# Patient Record
Sex: Male | Born: 1955 | Race: White | Hispanic: No | Smoking: Former smoker
Health system: Southern US, Community
[De-identification: ages and names within clinical notes are randomized; demographics above are authoritative.]

## PROBLEM LIST (undated history)

## (undated) DIAGNOSIS — I10 Essential (primary) hypertension: Secondary | ICD-10-CM

## (undated) DIAGNOSIS — E119 Type 2 diabetes mellitus without complications: Secondary | ICD-10-CM

## (undated) HISTORY — PX: CHOLECYSTECTOMY: SHX55

## (undated) HISTORY — PX: HERNIA REPAIR: SHX51

---

## 2002-06-24 ENCOUNTER — Inpatient Hospital Stay (HOSPITAL_COMMUNITY): Admission: AD | Admit: 2002-06-24 | Discharge: 2002-06-26 | Payer: Self-pay | Admitting: Family Medicine

## 2002-06-24 ENCOUNTER — Encounter: Payer: Self-pay | Admitting: Family Medicine

## 2007-03-05 ENCOUNTER — Ambulatory Visit (HOSPITAL_COMMUNITY): Admission: RE | Admit: 2007-03-05 | Discharge: 2007-03-05 | Payer: Self-pay | Admitting: Family Medicine

## 2010-12-31 NOTE — Consult Note (Signed)
NAME:  Scott Rios, Scott Rios                          ACCOUNT NO.:  [id number]   MEDICAL RECORD NO.:  [id number]                   PATIENT TYPE:  INP   LOCATION:  A311                                 FACILITY:  [facility]   PHYSICIAN:  R. Roetta Sessions, M.D.              DATE OF BIRTH:  1956/07/02   DATE OF CONSULTATION:  06/25/2002  DATE OF DISCHARGE:                                   CONSULTATION   REASON FOR CONSULTATION:  Acute pancreatitis.   HISTORY OF PRESENT ILLNESS:  The patient is a pleasant 55 year old gentleman  with history of diabetes mellitus, gout and hypertension, who presented with  a four-day history of severe epigastric pain, left upper quadrant pain  radiating into his back.  This was associated with nausea and vomiting.  He  was seen by Dr. Lorin Picket A. Luking in his office on Monday and was admitted for  further evaluation and management.  He had a CT of the abdomen and pelvis  which revealed a focal pancreatitis at the head of the pancreas.  On  admission, labs revealed normal LFTs except for albumin of 3.4, WBC of 19.4,  glucose 262, sodium 123, calcium 8.9, lipase 105, amylase 77.  The patient  states he had a similar episode two or three weeks ago; this lasted only for  a couple of days.  In between episodes, he is entirely pain-free.  He only  has occasional heartburn.  He usually has six formed stools daily.  Denies  any melena or rectal bleeding or hematemesis.  When he develops these  episodes of abdominal pain, he also will have some constipation.  With this  last episode, he had episodes of feeling very hot and then chills; no  documented fever, however.   He complains of epigastric/left upper quadrant abdominal pain which began  Saturday.  This progressively worsened over the weekend.  He rates it a  10/10 with a stabbing-type quality.  This radiated into his back.  It was  associated with multiple episodes of vomiting.  He tried to medicate himself  with  ibuprofen or Motrin with no relief.  Prior to development of these  symptoms, he had not been on any medication; he discontinued his Zestril,  Glucophage and allopurinol over one year ago, stating that he was unable to  afford his medication after being laid off from work.  Denies any other over-  the-counter medications.  He does not drink any alcohol.  He has no known  history of hypertriglyceridemia.  No family history of pancreatitis.  He is  status post cholecystectomy approximately two years ago.   Since admission, he has been n.p.o.  He is feeling much better today and  having no pain.  He is tolerating a clear liquid diet.   MEDICATIONS AT HOME:  He took Motrin and ibuprofen since his pain began.   ALLERGIES:  PENICILLIN.   PAST  MEDICAL HISTORY:  Diabetes mellitus, hypertension, gout.   PAST SURGICAL HISTORY:  Left inguinal hernia repair at age 55, pilonidal  cyst removal five years ago, cholecystectomy two years ago, tonsillectomy,  vasectomy in 1989.   FAMILY HISTORY:  No family history of pancreatitis.  His father died of oat  cell lung cancer.  No family history of colon cancer or chronic GI  illnesses.   SOCIAL HISTORY:  He is married and has three sons.  He smokes one pack of  cigarettes daily.  Denies any alcohol use.  He is laid off from [employer]; he is  currently unemployed.   REVIEW OF SYSTEMS:  Please see HPI for GI.  GENERAL:  Denies any weight  loss.  CARDIOPULMONARY:  Denies any chest pain or shortness of breath.   PHYSICAL EXAMINATION:  VITAL SIGNS:  Weight 222.7, height 5 feet 7 inches.  T-max 99.1, T current 97.7; pulse 105; respirations 20; and blood pressure  154/103.  GENERAL:  A pleasant, moderately obese Caucasian male in no acute distress.  SKIN:  Skin warm and dry, no jaundice.  HEENT:  Pupils are equal, round and reactive to light.  Conjunctivae are  pink.  Sclerae nonicteric.  Oropharyngeal mucosa moist and pink.  No  lesions, erythema or  exudate.  NECK:  No lymphadenopathy, thyromegaly or carotid bruits.  CHEST:  Lungs clear to auscultation.  CARDIAC:  Exam reveals a regular rate and rhythm, normal S1 and S2, no  murmurs, rubs, or gallops.  ABDOMEN:  Positive bowel sounds, diffuse but symmetrical.  Soft and  nontender.  No organomegaly or masses.  EXTREMITIES:  No edema.   LABORATORY DATA:  Laboratories today:  White count 16.5, hemoglobin 13.7,  hematocrit 38.5, platelets 268,000.  Sodium 131, potassium 4.8, BUN 6,  creatinine 0.9, glucose 230, total bilirubin 1, alkaline phosphatase 6, SGOT  15, SGPT 17, albumin 3.1, amylase 83, lipase 92, calcium 8.7.   IMPRESSION:  The patient is a pleasant 55 year old Caucasian male with acute  pancreatitis, based on symptomatology, CT findings and elevated lipase.  Currently, his symptoms are resolving.  He no longer has any abdominal pain.  He describes a similar episode two to three weeks ago which may have been  also an acute pancreatitis attack.  At this point, etiology has yet to be  determined.  There was no evidence of masses on CT, no apparent stones or  intrahepatic or extrahepatic biliary dilatation; serum calcium levels  normal; he is not on any medications which could provoke this; denies any  alcohol use; he is status post cholecystectomy.  A lipid panel is pending.  We also cannot rule out microlithiasis.   SUGGESTIONS:  1. Continue supportive measures.  2. Clear liquids are okay for now.  3. Follow up pending labs.  Based on lipid panel, we could consider a watch-     and-wait approach with a minimal of     a pancreatic CT with thin slices in three months versus further workup     now including endoscopic ultrasound or MRCP in the near future.   I would like to thank Dr. Lorin Picket A. Luking for allowing [place] to take part in  the care of this patient.     Tana Coast, Pricilla Larsson, M.D.   LL/MEDQ  D:  06/25/2002  T:  06/25/2002  Job:  [id number]   cc:   Lorin Picket A. Gerda Diss, M.D.  [address], [address]  [place]  [place] [address]  Fax: [phone]   R. Roetta Sessions, M.D.  [address]  [place]  [place] [address]  Fax: [phone]

## 2010-12-31 NOTE — H&P (Signed)
NAME:  Scott Rios, Scott Rios                          ACCOUNT NO.:  [id number]   MEDICAL RECORD NO.:  [id number]                   PATIENT TYPE:  INP   LOCATION:  A311                                 FACILITY:  [facility]   PHYSICIAN:  Scott A. Gerda Diss, M.D.               DATE OF BIRTH:  11-14-1955   DATE OF ADMISSION:  06/24/2002  DATE OF DISCHARGE:                                HISTORY & PHYSICAL   CHIEF COMPLAINT:  Abdominal pain.   HISTORY OF PRESENT ILLNESS:  This patient is a 55 year old white male who  has had at least three to four days of nausea and vomiting and abdominal  pain.  He describes the abdominal pain as being upper epigastric into the  right upper quadrant and sometimes radiates towards the back and sometimes  radiates up into the chest.  He relates nausea with multiple episodes of  vomiting.  Somewhat decreased urination.  Denies dysuria.  Denies fevers or  chills.  States the pain has been very intense.  He has tried many different  foods and drinks without success.  He denies any excessive use of acid-  inducing foods or medications.  Denies B.C. Powder, aspirin, Goody Powder,  Advil use, etc.  He does smoke a couple of packs a day; does not drink on a  regular basis.   PAST MEDICAL HISTORY:  Gout, hypertension, diabetes.  Currently he is not  taking any medicines.  He states he got laid off, could not afford the  medicines, so he stopped the medicines.  He has had a negative Cardiolite in  March 1999.   FAMILY HISTORY:  He has a family history of heart disease and lung cancer.   ALLERGIES:  PENCILLIN.   MEDICATIONS:  His usual medicines include:  1. Zestril 10 mg a day.  2. HCTZ 25 mg one-half q.a.m.  3. Aspirin one q.d.  4. Glucophage 500 mg two in the morning and one in the evening.   He is not taking any of these and has not been for well over a month.   REVIEW OF SYSTEMS:  Negative for headaches, blurred vision.  Relates  somewhat thirsty.  Denies neck  stiffness.  Denies shortness of breath.  Denies chest pressure with activity.  Denies joint discomforts.  States  somewhat frequent urination.   LABORATORY DATA:  Wbc 19.4, hemoglobin 15.1, hematocrit 41.6.  Sodium 123,  potassium 3.9, bicarb 22, glucose 262, amylase 77, lipase 105.   CT scan of the abdomen shows inflammation in the pancreas.  No tumors or  masses.    ASSESSMENT AND PLAN:  Abdominal pain - feel this is due to pancreatitis.  Feel the patient has a mild case but with the white count being elevated and  the persistent vomiting and low sodium, he needs to be hospitalized on IV  fluids, bowel rest.  Do not see any signs of infection  currently.  Will  monitor closely over the next 24-48 hours.  Will consult GI in the morning.  Will use pain control also and follow up labs again in the morning.                                                Scott A. Gerda Diss, M.D.    SAL/MEDQ  D:  06/25/2002  T:  06/25/2002  Job:  [id number]

## 2010-12-31 NOTE — Discharge Summary (Signed)
   NAME:  Scott Rios, Scott Rios                          ACCOUNT NO.:  [id number]   MEDICAL RECORD NO.:  [id number]                   PATIENT TYPE:  INP   LOCATION:  A311                                 FACILITY:  [facility]   PHYSICIAN:  Scott A. Gerda Diss, M.D.               DATE OF BIRTH:  1955/08/25   DATE OF ADMISSION:  06/24/2002  DATE OF DISCHARGE:  06/26/2002                                 DISCHARGE SUMMARY   DISCHARGE DIAGNOSES:  1. Pancreatitis.  2. Hypertriglyceridemia.  3. Diabetes.  4. Hypertension.   HOSPITAL COURSE:  The patient was admitted in with abdominal  discomfort/pain.  Tests pointing toward pancreatitis.  He did have an  ultrasound CT done.  Did not show any phlegmon or cysts.  Did show slight  inflammation consistent with pancreatitis.  Did have mild elevation of  enzymes which corrected and also, too, he had a lipid profile done which  showed a cholesterol 544 and triglycerides 2999.  The patient was talked to  at length regarding these findings in regards what to eat, what not to eat,  exercise, etc.   DISCHARGE MEDICATIONS:  1. Zestril 10 mg daily.  2. Glucophage 500 two in the morning, one in the evening.  3. Crestor 10 mg one daily.  Samples given.   FOLLOW UP:  Follow up in the office in six weeks.  May need additional  medicines added upon recheck and lipid liver profile in six to eight weeks.                                               Scott A. Gerda Diss, M.D.    SAL/MEDQ  D:  08/27/2002  T:  08/27/2002  Job:  [id number]

## 2012-09-29 ENCOUNTER — Emergency Department (HOSPITAL_COMMUNITY): Payer: 59

## 2012-09-29 ENCOUNTER — Encounter (HOSPITAL_COMMUNITY): Payer: Self-pay | Admitting: Emergency Medicine

## 2012-09-29 ENCOUNTER — Emergency Department (HOSPITAL_COMMUNITY)
Admission: EM | Admit: 2012-09-29 | Discharge: 2012-09-30 | Payer: 59 | Attending: Emergency Medicine | Admitting: Emergency Medicine

## 2012-09-29 DIAGNOSIS — E119 Type 2 diabetes mellitus without complications: Secondary | ICD-10-CM

## 2012-09-29 DIAGNOSIS — S63642A Sprain of metacarpophalangeal joint of left thumb, initial encounter: Secondary | ICD-10-CM

## 2012-09-29 DIAGNOSIS — R4182 Altered mental status, unspecified: Secondary | ICD-10-CM | POA: Insufficient documentation

## 2012-09-29 DIAGNOSIS — S63659A Sprain of metacarpophalangeal joint of unspecified finger, initial encounter: Secondary | ICD-10-CM | POA: Insufficient documentation

## 2012-09-29 DIAGNOSIS — Y939 Activity, unspecified: Secondary | ICD-10-CM | POA: Insufficient documentation

## 2012-09-29 DIAGNOSIS — Y929 Unspecified place or not applicable: Secondary | ICD-10-CM | POA: Insufficient documentation

## 2012-09-29 DIAGNOSIS — F29 Unspecified psychosis not due to a substance or known physiological condition: Secondary | ICD-10-CM | POA: Insufficient documentation

## 2012-09-29 DIAGNOSIS — Y33XXXA Other specified events, undetermined intent, initial encounter: Secondary | ICD-10-CM | POA: Insufficient documentation

## 2012-09-29 DIAGNOSIS — F23 Brief psychotic disorder: Secondary | ICD-10-CM

## 2012-09-29 HISTORY — DX: Essential (primary) hypertension: I10

## 2012-09-29 HISTORY — DX: Type 2 diabetes mellitus without complications: E11.9

## 2012-09-29 LAB — BASIC METABOLIC PANEL
CO2: 23 mEq/L (ref 19–32)
Calcium: 9.7 mg/dL (ref 8.4–10.5)
GFR calc Af Amer: >90 mL/min (ref >90)
GFR calc non Af Amer: >90 mL/min (ref >90)
Sodium: 138 mEq/L (ref 135–145)

## 2012-09-29 LAB — RAPID URINE DRUG SCREEN, HOSP PERFORMED
Amphetamines: NOT DETECTED
Barbiturates: NOT DETECTED
Benzodiazepines: NOT DETECTED

## 2012-09-29 LAB — CBC WITH DIFFERENTIAL/PLATELET
Basophils Absolute: 0.1 10*3/uL (ref 0.0–0.1)
Eosinophils Relative: 0 % (ref 0–5)
Lymphocytes Relative: 25 % (ref 12–46)
MCV: 82.4 fL (ref 78.0–100.0)
Platelets: 328 10*3/uL (ref 150–400)
RDW: 14.6 % (ref 11.5–15.5)
WBC: 13.8 10*3/uL — ABNORMAL HIGH (ref 4.0–10.5)

## 2012-09-29 LAB — URINALYSIS, ROUTINE W REFLEX MICROSCOPIC
Ketones, ur: 15 mg/dL — AB
Leukocytes, UA: NEGATIVE
Nitrite: NEGATIVE
Protein, ur: NEGATIVE mg/dL
Urobilinogen, UA: 0.2 mg/dL (ref 0.0–1.0)
pH: 5.5 (ref 5.0–8.0)

## 2012-09-29 LAB — ETHANOL: Alcohol, Ethyl (B): <11 mg/dL (ref 0–11)

## 2012-09-29 MED ORDER — ALUM & MAG HYDROXIDE-SIMETH 200-200-20 MG/5ML PO SUSP: 30.0000 mL | ORAL | Status: DC | PRN

## 2012-09-29 MED ORDER — ACETAMINOPHEN 325 MG PO TABS: 650.0000 mg | ORAL_TABLET | ORAL | Status: DC | PRN

## 2012-09-29 MED ORDER — ZOLPIDEM TARTRATE 5 MG PO TABS: 5.0000 mg | ORAL_TABLET | Freq: Every evening | ORAL | Status: DC | PRN

## 2012-09-29 MED ORDER — INSULIN ASPART 100 UNIT/ML ~~LOC~~ SOLN
0.0000 [IU] | Freq: Three times a day (TID) | SUBCUTANEOUS | Status: DC
Start: 2012-09-30 — End: 2012-09-30
  Administered 2012-09-30: 2 [IU] via SUBCUTANEOUS
  Filled 2012-09-29: qty 1

## 2012-09-29 MED ORDER — HALOPERIDOL 5 MG PO TABS
5.0000 mg | ORAL_TABLET | Freq: Once | ORAL | Status: AC
  Administered 2012-09-29: 5 mg via ORAL
  Filled 2012-09-29: qty 1

## 2012-09-29 MED ORDER — HALOPERIDOL 5 MG PO TABS: 5.0000 mg | ORAL_TABLET | Freq: Two times a day (BID) | ORAL | Status: DC

## 2012-09-29 MED ORDER — METFORMIN HCL 500 MG PO TABS
500.0000 mg | ORAL_TABLET | Freq: Two times a day (BID) | ORAL | Status: DC
  Administered 2012-09-30: 500 mg via ORAL
  Filled 2012-09-29: qty 1

## 2012-09-29 MED ORDER — LORAZEPAM 1 MG PO TABS
1.0000 mg | ORAL_TABLET | Freq: Three times a day (TID) | ORAL | Status: DC | PRN
  Administered 2012-09-29: 1 mg via ORAL
  Filled 2012-09-29: qty 1

## 2012-09-29 MED ORDER — ONDANSETRON HCL 4 MG PO TABS: 4.0000 mg | ORAL_TABLET | Freq: Three times a day (TID) | ORAL | Status: DC | PRN

## 2012-09-29 MED ORDER — IBUPROFEN 400 MG PO TABS: 600.0000 mg | ORAL_TABLET | Freq: Three times a day (TID) | ORAL | Status: DC | PRN

## 2012-09-29 NOTE — ED Notes (Signed)
Affect and though processes remain the same. Does remember our previous conversation 7pm. After much thought remembered he was in [place] in [place].

## 2012-09-29 NOTE — BH Assessment (Signed)
[facility] Assessment Progress Note      09/29/2012 Attempting placement for patient at this time.  There are no beds at [facility], [facility], [facility] and [facility]. Patient is on a wait list for [facility] and [facility]. Patient has been referred to [facility], [facility], [facility], [facility], [facility] and [facility].   Awaiting call back from any of the facilities to accept patient.  IVC paperwork completed and signed by the ED physician.

## 2012-09-29 NOTE — BH Assessment (Signed)
[facility] Assessment Progress Note      09/29/2012 Raynelle Fanning from [facility] states they are full and do not have availability for inpatient services.  Shon Baton, MSW, LCSw, [place], CSW-G
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
## 2012-09-29 NOTE — ED Notes (Signed)
Pt's son [name], at bedside, pt did recognize him after a few minutes. Now talking in repetitive circles. Remains calm
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
## 2012-09-30 NOTE — ED Provider Notes (Signed)
[id number] Advised by nursing that patient has been accepted at [facility] by Ronn Melena, NP. Pt stable in ED with no significant deterioration in condition.The patient appears reasonably screened and/or stabilized for discharge and I doubt any other medical condition or other [facility] requiring further screening, evaluation, or treatment in the ED at this time prior to discharge.  Nicoletta Dress. Colon Branch, MD 09/30/12 [id number]

## 2012-09-30 NOTE — ED Notes (Signed)
Belongings given to [employer]. Wallet in bag. No belongings with security.

## 2012-09-30 NOTE — ED Notes (Signed)
Received call from [facility], will accept pt tonight. Sheriffs deputy notified to arrange transport.

## 2012-09-30 NOTE — ED Notes (Signed)
Talked with pt's son [name], and let him know of impending transfer to [facility].

## 2012-09-30 NOTE — ED Notes (Signed)
Received call from [facility] needing additional fax info. States they will review with their doctor in am and call us back. Info requested has been faxed.

## 2012-09-30 NOTE — ED Notes (Addendum)
Transport [facility] [employer] [facility], will not be avail until after 6am. Plan at this time is to keep pt here until after breakfast and morning meds and transport between 7:30-8am. Our ED physician and [facility] both OK w/this plan

## 2013-10-11 ENCOUNTER — Encounter (HOSPITAL_COMMUNITY): Payer: Self-pay | Admitting: Emergency Medicine

## 2013-10-11 ENCOUNTER — Emergency Department (HOSPITAL_COMMUNITY)
Admission: EM | Admit: 2013-10-11 | Discharge: 2013-10-11 | Disposition: A | Discharge status: Home / Self Care | Payer: 59 | Attending: Emergency Medicine | Admitting: Emergency Medicine

## 2013-10-11 DIAGNOSIS — F17200 Nicotine dependence, unspecified, uncomplicated: Secondary | ICD-10-CM | POA: Insufficient documentation

## 2013-10-11 DIAGNOSIS — E119 Type 2 diabetes mellitus without complications: Secondary | ICD-10-CM | POA: Insufficient documentation

## 2013-10-11 DIAGNOSIS — I10 Essential (primary) hypertension: Secondary | ICD-10-CM | POA: Insufficient documentation

## 2013-10-11 DIAGNOSIS — R079 Chest pain, unspecified: Secondary | ICD-10-CM | POA: Insufficient documentation

## 2013-10-11 DIAGNOSIS — Z880 Allergy status to penicillin: Secondary | ICD-10-CM | POA: Insufficient documentation

## 2013-10-11 DIAGNOSIS — R0602 Shortness of breath: Secondary | ICD-10-CM | POA: Insufficient documentation

## 2013-10-11 DIAGNOSIS — F4321 Adjustment disorder with depressed mood: Secondary | ICD-10-CM | POA: Insufficient documentation

## 2013-10-11 LAB — I-STAT CHEM 8, ED
BUN: 7 mg/dL (ref 6–23)
CREATININE: 0.7 mg/dL (ref 0.50–1.35)
Calcium, Ion: 1.13 mmol/L (ref 1.12–1.23)
Chloride: 101 mEq/L (ref 96–112)
Glucose, Bld: 321 mg/dL — ABNORMAL HIGH (ref 70–99)
HEMATOCRIT: 45 % (ref 39.0–52.0)
HEMOGLOBIN: 15.3 g/dL (ref 13.0–17.0)
Potassium: 3.7 mEq/L (ref 3.7–5.3)
SODIUM: 141 meq/L (ref 137–147)
TCO2: 23 mmol/L (ref 0–100)

## 2013-10-11 LAB — I-STAT TROPONIN, ED: TROPONIN I, POC: 0.02 ng/mL (ref 0.00–0.08)

## 2013-10-11 NOTE — Discharge Instructions (Signed)
Return to the ED if you get chest pain again. [facility] need to [facility] to be [facility] soon, your blood sugar was 321 today. Also return to the ED if you feel you can't deal with your depression or you feel you might do something to hurt yourself or others.    Grief Reaction Grief is a normal response to the death of someone close to you. Feelings of fear, anger, and guilt can affect almost everyone who loses someone they love. Symptoms of depression are also common. These include problems with sleep, loss of appetite, and lack of energy. These grief reaction symptoms often last for weeks to months after a loss. They may also return during special times that remind you of the person you lost, such as an anniversary or birthday. Anxiety, insomnia, irritability, and deep depression may last beyond the period of normal grief. If you experience these feelings for 6 months or longer, you may have clinical depression. Clinical depression requires further medical attention. If you think that you have clinical depression, you should contact your caregiver. If you have a history of depression and or a family history of depression, you are at greater risk of clinical depression. You are also at greater risk of developing clinical depression if the loss was traumatic or the loss was of someone with whom you had unresolved issues.  A grief reaction can become complicated by being blocked. This means being unable to cry or express extreme emotions. This may prolong the grieving period and worsen the emotional effects of the loss. Mourning is a natural event in human life. A healthy grief reaction is one that is not blocked . It requires a time of sadness and readjustment.It is very important to share your sorrow and fear with others, especially close friends and family. Professional counselors and clergy can also help you process your grief. Document Released: 08/01/2005 Document Revised: 10/24/2011 Document Reviewed:  04/11/2006 [facility] Patient Information 2014 [employer], [place].

## 2013-10-11 NOTE — ED Notes (Signed)
Scott Rios called to inform staff that she had spoken with both son and pt. Pt is in denial over his wife's death this morning. States patient also worked 3rd shift last night and has not eaten or had any sleep.

## 2013-10-11 NOTE — ED Provider Notes (Signed)
CSN: [id number]     Arrival date & time 10/11/13  1210 History   First MD Initiated Contact with Patient 10/11/13 1238     Chief Complaint  Patient presents with  . Anxiety     (Consider location/radiation/quality/duration/timing/severity/associated sxs/prior Treatment) HPI  Patient reports he came home this morning and his wife usually sleeps in the living room because she is an invalid and has difficulty getting up and down and he became aware that she was not responding to him entering the house. He went to ask his son if he had noted that she got up during the night which he said he last seen her at 2:00 and her breathing was more shallow than usual. Patient then went over and checked his wife and noted that she was not breathing and she was cold to touch. They called 911 and after the police arrived and verified to him that his wife was deceased patient had an episode of chest pain and shortness of breath that lasted about 5 minutes per his son. He was encouraged to come to the ED to be evaluated. He and his son report this happened about 9 AM. Patient states he has no known history of heart disease. He does have diabetes and hypertension but he takes no medications. Patient is unable to describe his chest discomfort.  PCP Dr. Sherwood GamblerFusco  Past Medical History  Diagnosis Date  . Hypertension   . Diabetes mellitus without complication    Past Surgical History  Procedure Laterality Date  . Cholecystectomy    . Hernia repair     No family history on file. History  Substance Use Topics  . Smoking status: Current Every Day Smoker  . Smokeless tobacco: Not on file  . Alcohol Use: No  lives with son Smokes 1 ppd employed  Review of Systems  All other systems reviewed and are negative.      Allergies  Penicillins  Home Medications  No current outpatient prescriptions on file.    BP 169/88  Pulse 93  Temp(Src) 97.8 F (36.6 C)  Resp 20  Ht 5\' 7"  (1.702 m)  Wt 172 lb  (78.019 kg)  BMI 26.93 kg/m2  SpO2 96%  Vital signs normal   Physical Exam  Nursing note and vitals reviewed. Constitutional: He is oriented to person, place, and time. He appears well-developed and well-nourished.  Non-toxic appearance. He does not appear ill. No distress.  HENT:  Head: Normocephalic and atraumatic.  Right Ear: External ear normal.  Left Ear: External ear normal.  Nose: Nose normal. No mucosal edema or rhinorrhea.  Mouth/Throat: Oropharynx is clear and moist and mucous membranes are normal. No dental abscesses or uvula swelling.  Eyes: Conjunctivae and EOM are normal. Pupils are equal, round, and reactive to light.  Neck: Normal range of motion and full passive range of motion without pain. Neck supple.  Cardiovascular: Normal rate, regular rhythm and normal heart sounds.  Exam reveals no gallop and no friction rub.   No murmur heard. Pulmonary/Chest: Effort normal and breath sounds normal. No respiratory distress. He has no wheezes. He has no rhonchi. He has no rales. He exhibits no tenderness and no crepitus.  Abdominal: Soft. Normal appearance and bowel sounds are normal. He exhibits no distension. There is no tenderness. There is no rebound and no guarding.  Musculoskeletal: Normal range of motion. He exhibits no edema and no tenderness.  Moves all extremities well.   Neurological: He is alert and oriented to person,  place, and time. He has normal strength. No cranial nerve deficit.  Skin: Skin is warm, dry and intact. No rash noted. No erythema. No pallor.  Psychiatric: His speech is normal and behavior is normal. His mood appears not anxious.  Flat affect    ED Course  Procedures (including critical care time)  Pt denies any further episodes of chest pain. He has no history of CAD. PT had chest pain and SOB most likely from grief reaction.   Labs Review Results for orders placed during the hospital encounter of 10/11/13  I-STAT TROPOININ, ED      Result  Value Ref Range   Troponin i, poc 0.02  0.00 - 0.08 ng/mL   Comment 3           I-STAT CHEM 8, ED      Result Value Ref Range   Sodium 141  137 - 147 mEq/L   Potassium 3.7  3.7 - 5.3 mEq/L   Chloride 101  96 - 112 mEq/L   BUN 7  6 - 23 mg/dL   Creatinine, Ser 9.51  0.50 - 1.35 mg/dL   Glucose, Bld 884 (*) 70 - 99 mg/dL   Calcium, Ion 1.66  0.63 - 1.23 mmol/L   TCO2 23  0 - 100 mmol/L   Hemoglobin 15.3  13.0 - 17.0 g/dL   HCT 01.6  01.0 - 93.2 %   Laboratory interpretation all normal except hyperglycemia .   Imaging Review No results found.   EKG Interpretation  Date/Time:  Friday October 11 2013 13:51:13 EST Ventricular Rate:  81 PR Interval:  172 QRS Duration: 102 QT Interval:  388 QTC Calculation: 450 R Axis:   -23 Text Interpretation:  Normal sinus rhythm Moderate voltage criteria for LVH, may be normal variant Anteroseptal infarct (cited on or before 29-Sep-2012) When compared with ECG of 29-Sep-2012 17:22, Questionable change in initial forces of Septal leads T wave amplitude has decreased in Lateral leads Reconfirmed by Rhylee Pucillo  MD-I, Xavius Spadafore ([id number]) on 10/11/2013 5:25:43 PM        MDM   Final diagnoses:  Grief reaction    Plan discharge  Devoria Albe, MD, Franz Dell, MD 10/11/13 1728

## 2013-10-11 NOTE — ED Notes (Signed)
AC called to advise of funeral home and contact [phone]

## 2013-10-11 NOTE — ED Notes (Signed)
Pt's son here with his dad. States pt's wife dies this morning but pt thinks he is here to see his wife

## 2017-06-15 ENCOUNTER — Emergency Department (HOSPITAL_COMMUNITY)
Admission: EM | Admit: 2017-06-15 | Discharge: 2017-06-16 | Discharge status: Against Medical Advice | Payer: 59 | Attending: Emergency Medicine | Admitting: Emergency Medicine

## 2017-06-15 DIAGNOSIS — I10 Essential (primary) hypertension: Secondary | ICD-10-CM | POA: Insufficient documentation

## 2017-06-15 DIAGNOSIS — R4182 Altered mental status, unspecified: Secondary | ICD-10-CM | POA: Insufficient documentation

## 2017-06-15 DIAGNOSIS — E119 Type 2 diabetes mellitus without complications: Secondary | ICD-10-CM | POA: Insufficient documentation

## 2017-06-15 DIAGNOSIS — F17210 Nicotine dependence, cigarettes, uncomplicated: Secondary | ICD-10-CM | POA: Insufficient documentation

## 2017-06-15 NOTE — ED Triage Notes (Signed)
Pt brought in by ems after family reports pt is not acting right, not able to recognize family members, was found wandering behind his house in the weeds.

## 2017-06-16 ENCOUNTER — Encounter (HOSPITAL_COMMUNITY): Payer: Self-pay | Admitting: Emergency Medicine

## 2017-06-16 NOTE — ED Provider Notes (Signed)
[facility] EMERGENCY DEPARTMENT Provider Note   CSN: [id number] Arrival date & time: 06/15/17  2347     History   Chief Complaint Chief Complaint  Patient presents with  . Altered Mental Status    HPI Scott Rios is a 61 y.o. male.  Patient presents to the emergency department for evaluation via ambulance.  Patient brought from home.  Patient's son reportedly noted that he was acting strangely.  Patient did not know where he was and could not answer simple questions.  By the time he arrives in the ER, patient is awake alert and oriented.  He is demanding to leave.  He is without complaints.  Denies headache, chest pain, shortness of breath, recent illness.    Altered Mental Status   Associated symptoms include confusion.    Past Medical History:  Diagnosis Date  . Diabetes mellitus without complication (HCC)   . Hypertension     There are no active problems to display for this patient.   Past Surgical History:  Procedure Laterality Date  . CHOLECYSTECTOMY    . HERNIA REPAIR         Home Medications    Prior to Admission medications   Not on File    Family History No family history on file.  Social History Social History  Substance Use Topics  . Smoking status: Current Every Day Smoker  . Smokeless tobacco: Not on file  . Alcohol use No     Allergies   Penicillins   Review of Systems Review of Systems  Psychiatric/Behavioral: Positive for confusion.  All other systems reviewed and are negative.    Physical Exam Updated Vital Signs BP 137/82 (BP Location: Right Arm)   Pulse 94   Temp 98.5 F (36.9 C) (Oral)   Resp 18   Ht 5\' 7"  (1.702 m)   Wt 76.2 kg (168 lb)   SpO2 95%   BMI 26.31 kg/m   Physical Exam  Constitutional: He is oriented to person, place, and time. He appears well-developed and well-nourished. No distress.  HENT:  Head: Normocephalic and atraumatic.  Right Ear: Hearing normal.  Left Ear: Hearing normal.  Nose:  Nose normal.  Mouth/Throat: Oropharynx is clear and moist and mucous membranes are normal.  Eyes: Pupils are equal, round, and reactive to light. Conjunctivae and EOM are normal.  Neck: Normal range of motion. Neck supple.  Cardiovascular: Regular rhythm, S1 normal and S2 normal.  Exam reveals no gallop and no friction rub.   No murmur heard. Pulmonary/Chest: Effort normal and breath sounds normal. No respiratory distress. He exhibits no tenderness.  Abdominal: Soft. Normal appearance and bowel sounds are normal. There is no hepatosplenomegaly. There is no tenderness. There is no rebound, no guarding, no tenderness at McBurney's point and negative Murphy's sign. No hernia.  Musculoskeletal: Normal range of motion.  Neurological: He is alert and oriented to person, place, and time. He has normal strength. No cranial nerve deficit or sensory deficit. Coordination normal. GCS eye subscore is 4. GCS verbal subscore is 5. GCS motor subscore is 6.  Skin: Skin is warm, dry and intact. No rash noted. No cyanosis.  Psychiatric: He has a normal mood and affect. His speech is normal and behavior is normal. Thought content normal.  Nursing note and vitals reviewed.    ED Treatments / Results  Labs (all labs ordered are listed, but only abnormal results are displayed) Labs Reviewed - No data to display  EKG  EKG Interpretation None  Radiology No results found.  Procedures Procedures (including critical care time)  Medications Ordered in ED Medications - No data to display   Initial Impression / Assessment and Plan / ED Course  I have reviewed the triage vital signs and the nursing notes.  Pertinent labs & imaging results that were available during my care of the patient were reviewed by me and considered in my medical decision making (see chart for details).     Patient is awake, alert, oriented.  He apparently had some confusion earlier tonight, but this has completely resolved.   Patient is refusing workup here in the ER.  I had an extensive conversation with him and his son.  He was informed that he could have a life-threatening medical condition that caused his confusion.  He understands this but refuses any workup at this time.  He is demanding to leave immediately.  As the patient is awake, alert and oriented and appears to have capacity to make this decision, understands the possible risks of leaving without treatment, he was discharged AGAINST MEDICAL ADVICE. He was encouraged to return if he has any worsening symptoms. He was released into the care of his son.  Final Clinical Impressions(s) / ED Diagnoses   Final diagnoses:  Altered mental status, unspecified altered mental status type    New Prescriptions New Prescriptions   No medications on file     Gilda Crease, MD 06/16/17 [phone]

## 2020-03-27 ENCOUNTER — Encounter (HOSPITAL_COMMUNITY): Payer: Self-pay

## 2020-03-27 ENCOUNTER — Other Ambulatory Visit: Payer: Self-pay

## 2020-03-27 ENCOUNTER — Inpatient Hospital Stay (HOSPITAL_COMMUNITY)
Admission: EM | Admit: 2020-03-27 | Discharge: 2020-04-18 | DRG: 216 | Disposition: A | Discharge status: Home / Self Care | Payer: Medicare Other | Attending: Surgery | Admitting: Surgery

## 2020-03-27 DIAGNOSIS — M264 Malocclusion, unspecified: Secondary | ICD-10-CM | POA: Diagnosis present

## 2020-03-27 DIAGNOSIS — Z9889 Other specified postprocedural states: Secondary | ICD-10-CM

## 2020-03-27 DIAGNOSIS — R231 Pallor: Secondary | ICD-10-CM | POA: Diagnosis present

## 2020-03-27 DIAGNOSIS — M4300 Spondylolysis, site unspecified: Secondary | ICD-10-CM | POA: Diagnosis present

## 2020-03-27 DIAGNOSIS — Z01818 Encounter for other preprocedural examination: Secondary | ICD-10-CM

## 2020-03-27 DIAGNOSIS — F419 Anxiety disorder, unspecified: Secondary | ICD-10-CM | POA: Diagnosis present

## 2020-03-27 DIAGNOSIS — R911 Solitary pulmonary nodule: Secondary | ICD-10-CM

## 2020-03-27 DIAGNOSIS — K083 Retained dental root: Secondary | ICD-10-CM | POA: Diagnosis present

## 2020-03-27 DIAGNOSIS — Z952 Presence of prosthetic heart valve: Secondary | ICD-10-CM

## 2020-03-27 DIAGNOSIS — I4891 Unspecified atrial fibrillation: Secondary | ICD-10-CM | POA: Diagnosis not present

## 2020-03-27 DIAGNOSIS — R7989 Other specified abnormal findings of blood chemistry: Secondary | ICD-10-CM

## 2020-03-27 DIAGNOSIS — I5021 Acute systolic (congestive) heart failure: Secondary | ICD-10-CM

## 2020-03-27 DIAGNOSIS — J9601 Acute respiratory failure with hypoxia: Secondary | ICD-10-CM

## 2020-03-27 DIAGNOSIS — K045 Chronic apical periodontitis: Secondary | ICD-10-CM | POA: Diagnosis present

## 2020-03-27 DIAGNOSIS — G4700 Insomnia, unspecified: Secondary | ICD-10-CM | POA: Diagnosis not present

## 2020-03-27 DIAGNOSIS — K011 Impacted teeth: Secondary | ICD-10-CM | POA: Diagnosis present

## 2020-03-27 DIAGNOSIS — E871 Hypo-osmolality and hyponatremia: Secondary | ICD-10-CM

## 2020-03-27 DIAGNOSIS — M4856XA Collapsed vertebra, not elsewhere classified, lumbar region, initial encounter for fracture: Secondary | ICD-10-CM | POA: Diagnosis present

## 2020-03-27 DIAGNOSIS — R0603 Acute respiratory distress: Secondary | ICD-10-CM | POA: Diagnosis present

## 2020-03-27 DIAGNOSIS — F1021 Alcohol dependence, in remission: Secondary | ICD-10-CM | POA: Diagnosis present

## 2020-03-27 DIAGNOSIS — Z20822 Contact with and (suspected) exposure to covid-19: Secondary | ICD-10-CM | POA: Diagnosis present

## 2020-03-27 DIAGNOSIS — K5909 Other constipation: Secondary | ICD-10-CM | POA: Diagnosis present

## 2020-03-27 DIAGNOSIS — M479 Spondylosis, unspecified: Secondary | ICD-10-CM | POA: Diagnosis present

## 2020-03-27 DIAGNOSIS — R739 Hyperglycemia, unspecified: Secondary | ICD-10-CM

## 2020-03-27 DIAGNOSIS — I454 Nonspecific intraventricular block: Secondary | ICD-10-CM | POA: Diagnosis not present

## 2020-03-27 DIAGNOSIS — I77810 Thoracic aortic ectasia: Secondary | ICD-10-CM | POA: Diagnosis present

## 2020-03-27 DIAGNOSIS — I352 Nonrheumatic aortic (valve) stenosis with insufficiency: Principal | ICD-10-CM | POA: Diagnosis present

## 2020-03-27 DIAGNOSIS — R0989 Other specified symptoms and signs involving the circulatory and respiratory systems: Secondary | ICD-10-CM | POA: Diagnosis not present

## 2020-03-27 DIAGNOSIS — M5127 Other intervertebral disc displacement, lumbosacral region: Secondary | ICD-10-CM | POA: Diagnosis present

## 2020-03-27 DIAGNOSIS — Z9049 Acquired absence of other specified parts of digestive tract: Secondary | ICD-10-CM

## 2020-03-27 DIAGNOSIS — Z9689 Presence of other specified functional implants: Secondary | ICD-10-CM

## 2020-03-27 DIAGNOSIS — J439 Emphysema, unspecified: Secondary | ICD-10-CM | POA: Diagnosis present

## 2020-03-27 DIAGNOSIS — F17210 Nicotine dependence, cigarettes, uncomplicated: Secondary | ICD-10-CM | POA: Diagnosis present

## 2020-03-27 DIAGNOSIS — E8809 Other disorders of plasma-protein metabolism, not elsewhere classified: Secondary | ICD-10-CM

## 2020-03-27 DIAGNOSIS — I5041 Acute combined systolic (congestive) and diastolic (congestive) heart failure: Secondary | ICD-10-CM | POA: Diagnosis present

## 2020-03-27 DIAGNOSIS — I4892 Unspecified atrial flutter: Secondary | ICD-10-CM | POA: Diagnosis present

## 2020-03-27 DIAGNOSIS — R45851 Suicidal ideations: Secondary | ICD-10-CM | POA: Diagnosis not present

## 2020-03-27 DIAGNOSIS — I255 Ischemic cardiomyopathy: Secondary | ICD-10-CM | POA: Diagnosis present

## 2020-03-27 DIAGNOSIS — D689 Coagulation defect, unspecified: Secondary | ICD-10-CM | POA: Diagnosis present

## 2020-03-27 DIAGNOSIS — J9811 Atelectasis: Secondary | ICD-10-CM | POA: Diagnosis not present

## 2020-03-27 DIAGNOSIS — J810 Acute pulmonary edema: Secondary | ICD-10-CM

## 2020-03-27 DIAGNOSIS — Z01811 Encounter for preprocedural respiratory examination: Secondary | ICD-10-CM

## 2020-03-27 DIAGNOSIS — Z880 Allergy status to penicillin: Secondary | ICD-10-CM

## 2020-03-27 DIAGNOSIS — K029 Dental caries, unspecified: Secondary | ICD-10-CM | POA: Diagnosis present

## 2020-03-27 DIAGNOSIS — R29898 Other symptoms and signs involving the musculoskeletal system: Secondary | ICD-10-CM | POA: Diagnosis present

## 2020-03-27 DIAGNOSIS — L8990 Pressure ulcer of unspecified site, unspecified stage: Secondary | ICD-10-CM | POA: Insufficient documentation

## 2020-03-27 DIAGNOSIS — I340 Nonrheumatic mitral (valve) insufficiency: Secondary | ICD-10-CM

## 2020-03-27 DIAGNOSIS — I700 Atherosclerosis of aorta: Secondary | ICD-10-CM | POA: Diagnosis present

## 2020-03-27 DIAGNOSIS — E538 Deficiency of other specified B group vitamins: Secondary | ICD-10-CM | POA: Diagnosis present

## 2020-03-27 DIAGNOSIS — R4182 Altered mental status, unspecified: Secondary | ICD-10-CM | POA: Diagnosis not present

## 2020-03-27 DIAGNOSIS — F4321 Adjustment disorder with depressed mood: Secondary | ICD-10-CM

## 2020-03-27 DIAGNOSIS — D72829 Elevated white blood cell count, unspecified: Secondary | ICD-10-CM | POA: Diagnosis present

## 2020-03-27 DIAGNOSIS — Z720 Tobacco use: Secondary | ICD-10-CM

## 2020-03-27 DIAGNOSIS — M48061 Spinal stenosis, lumbar region without neurogenic claudication: Secondary | ICD-10-CM | POA: Diagnosis present

## 2020-03-27 DIAGNOSIS — I2510 Atherosclerotic heart disease of native coronary artery without angina pectoris: Secondary | ICD-10-CM

## 2020-03-27 DIAGNOSIS — I214 Non-ST elevation (NSTEMI) myocardial infarction: Secondary | ICD-10-CM

## 2020-03-27 DIAGNOSIS — D62 Acute posthemorrhagic anemia: Secondary | ICD-10-CM | POA: Diagnosis not present

## 2020-03-27 DIAGNOSIS — Z8249 Family history of ischemic heart disease and other diseases of the circulatory system: Secondary | ICD-10-CM

## 2020-03-27 DIAGNOSIS — R0600 Dyspnea, unspecified: Secondary | ICD-10-CM

## 2020-03-27 DIAGNOSIS — R0602 Shortness of breath: Secondary | ICD-10-CM

## 2020-03-27 DIAGNOSIS — M5126 Other intervertebral disc displacement, lumbar region: Secondary | ICD-10-CM | POA: Diagnosis present

## 2020-03-27 DIAGNOSIS — I110 Hypertensive heart disease with heart failure: Secondary | ICD-10-CM | POA: Diagnosis present

## 2020-03-27 DIAGNOSIS — E1165 Type 2 diabetes mellitus with hyperglycemia: Secondary | ICD-10-CM | POA: Diagnosis present

## 2020-03-27 DIAGNOSIS — E876 Hypokalemia: Secondary | ICD-10-CM | POA: Diagnosis not present

## 2020-03-27 DIAGNOSIS — R778 Other specified abnormalities of plasma proteins: Secondary | ICD-10-CM

## 2020-03-27 DIAGNOSIS — E440 Moderate protein-calorie malnutrition: Secondary | ICD-10-CM | POA: Insufficient documentation

## 2020-03-27 DIAGNOSIS — Z79899 Other long term (current) drug therapy: Secondary | ICD-10-CM

## 2020-03-27 DIAGNOSIS — G8929 Other chronic pain: Secondary | ICD-10-CM | POA: Diagnosis present

## 2020-03-27 DIAGNOSIS — Z951 Presence of aortocoronary bypass graft: Secondary | ICD-10-CM

## 2020-03-27 DIAGNOSIS — I351 Nonrheumatic aortic (valve) insufficiency: Secondary | ICD-10-CM

## 2020-03-27 DIAGNOSIS — I420 Dilated cardiomyopathy: Secondary | ICD-10-CM | POA: Diagnosis present

## 2020-03-27 LAB — CBC WITH DIFFERENTIAL/PLATELET
Abs Immature Granulocytes: 0.03 10*3/uL (ref 0.00–0.07)
Basophils Absolute: 0.1 10*3/uL (ref 0.0–0.1)
Basophils Relative: 1 %
Eosinophils Absolute: 0.3 10*3/uL (ref 0.0–0.5)
Eosinophils Relative: 3 %
HCT: 36.8 % — ABNORMAL LOW (ref 39.0–52.0)
Hemoglobin: 12.9 g/dL — ABNORMAL LOW (ref 13.0–17.0)
Immature Granulocytes: 0 %
Lymphocytes Relative: 19 %
Lymphs Abs: 2 10*3/uL (ref 0.7–4.0)
MCH: 30 pg (ref 26.0–34.0)
MCHC: 35.1 g/dL (ref 30.0–36.0)
MCV: 85.6 fL (ref 80.0–100.0)
Monocytes Absolute: 0.6 10*3/uL (ref 0.1–1.0)
Monocytes Relative: 6 %
Neutro Abs: 7.5 10*3/uL (ref 1.7–7.7)
Neutrophils Relative %: 71 %
Platelets: 319 10*3/uL (ref 150–400)
RBC: 4.3 MIL/uL (ref 4.22–5.81)
RDW: 13.2 % (ref 11.5–15.5)
WBC: 10.6 10*3/uL — ABNORMAL HIGH (ref 4.0–10.5)
nRBC: 0 % (ref 0.0–0.2)

## 2020-03-27 LAB — COMPREHENSIVE METABOLIC PANEL
ALT: 9 U/L (ref 0–44)
AST: 12 U/L — ABNORMAL LOW (ref 15–41)
Albumin: 3.1 g/dL — ABNORMAL LOW (ref 3.5–5.0)
Alkaline Phosphatase: 59 U/L (ref 38–126)
Anion gap: 11 (ref 5–15)
BUN: 8 mg/dL (ref 8–23)
CO2: 22 mmol/L (ref 22–32)
Calcium: 8.5 mg/dL — ABNORMAL LOW (ref 8.9–10.3)
Chloride: 93 mmol/L — ABNORMAL LOW (ref 98–111)
Creatinine, Ser: 0.64 mg/dL (ref 0.61–1.24)
GFR calc Af Amer: >60 mL/min (ref >60)
GFR calc non Af Amer: >60 mL/min (ref >60)
Glucose, Bld: 344 mg/dL — ABNORMAL HIGH (ref 70–99)
Potassium: 3.6 mmol/L (ref 3.5–5.1)
Sodium: 126 mmol/L — ABNORMAL LOW (ref 135–145)
Total Bilirubin: 0.3 mg/dL (ref 0.3–1.2)
Total Protein: 5.7 g/dL — ABNORMAL LOW (ref 6.5–8.1)

## 2020-03-27 NOTE — ED Triage Notes (Signed)
Pt in by [facility] from home.  Pt c/o numbness and tingling to both legs for more than 2 weeks.  Pt states he has not been to a doctor for over 10 years.  Pt states he lives at home with his Son, uses a cane to ambulate.

## 2020-03-28 ENCOUNTER — Emergency Department (HOSPITAL_COMMUNITY): Payer: Medicare Other

## 2020-03-28 ENCOUNTER — Encounter (HOSPITAL_COMMUNITY): Payer: Self-pay | Admitting: Internal Medicine

## 2020-03-28 ENCOUNTER — Inpatient Hospital Stay (HOSPITAL_COMMUNITY): Payer: Medicare Other

## 2020-03-28 DIAGNOSIS — E8809 Other disorders of plasma-protein metabolism, not elsewhere classified: Secondary | ICD-10-CM

## 2020-03-28 DIAGNOSIS — F17200 Nicotine dependence, unspecified, uncomplicated: Secondary | ICD-10-CM | POA: Diagnosis not present

## 2020-03-28 DIAGNOSIS — D689 Coagulation defect, unspecified: Secondary | ICD-10-CM | POA: Diagnosis not present

## 2020-03-28 DIAGNOSIS — M5127 Other intervertebral disc displacement, lumbosacral region: Secondary | ICD-10-CM | POA: Diagnosis present

## 2020-03-28 DIAGNOSIS — I5041 Acute combined systolic (congestive) and diastolic (congestive) heart failure: Secondary | ICD-10-CM | POA: Diagnosis not present

## 2020-03-28 DIAGNOSIS — Z880 Allergy status to penicillin: Secondary | ICD-10-CM | POA: Diagnosis not present

## 2020-03-28 DIAGNOSIS — M48061 Spinal stenosis, lumbar region without neurogenic claudication: Secondary | ICD-10-CM | POA: Diagnosis present

## 2020-03-28 DIAGNOSIS — M5126 Other intervertebral disc displacement, lumbar region: Secondary | ICD-10-CM | POA: Diagnosis present

## 2020-03-28 DIAGNOSIS — J810 Acute pulmonary edema: Secondary | ICD-10-CM

## 2020-03-28 DIAGNOSIS — I352 Nonrheumatic aortic (valve) stenosis with insufficiency: Secondary | ICD-10-CM | POA: Diagnosis not present

## 2020-03-28 DIAGNOSIS — R29898 Other symptoms and signs involving the musculoskeletal system: Secondary | ICD-10-CM

## 2020-03-28 DIAGNOSIS — E1165 Type 2 diabetes mellitus with hyperglycemia: Secondary | ICD-10-CM | POA: Diagnosis present

## 2020-03-28 DIAGNOSIS — I110 Hypertensive heart disease with heart failure: Secondary | ICD-10-CM | POA: Diagnosis present

## 2020-03-28 DIAGNOSIS — I5021 Acute systolic (congestive) heart failure: Secondary | ICD-10-CM

## 2020-03-28 DIAGNOSIS — Z720 Tobacco use: Secondary | ICD-10-CM

## 2020-03-28 DIAGNOSIS — I351 Nonrheumatic aortic (valve) insufficiency: Secondary | ICD-10-CM | POA: Diagnosis not present

## 2020-03-28 DIAGNOSIS — R778 Other specified abnormalities of plasma proteins: Secondary | ICD-10-CM

## 2020-03-28 DIAGNOSIS — R911 Solitary pulmonary nodule: Secondary | ICD-10-CM

## 2020-03-28 DIAGNOSIS — I2510 Atherosclerotic heart disease of native coronary artery without angina pectoris: Secondary | ICD-10-CM | POA: Diagnosis not present

## 2020-03-28 DIAGNOSIS — T1491XA Suicide attempt, initial encounter: Secondary | ICD-10-CM | POA: Diagnosis not present

## 2020-03-28 DIAGNOSIS — I340 Nonrheumatic mitral (valve) insufficiency: Secondary | ICD-10-CM | POA: Diagnosis not present

## 2020-03-28 DIAGNOSIS — I350 Nonrheumatic aortic (valve) stenosis: Secondary | ICD-10-CM | POA: Diagnosis not present

## 2020-03-28 DIAGNOSIS — J9601 Acute respiratory failure with hypoxia: Secondary | ICD-10-CM | POA: Diagnosis not present

## 2020-03-28 DIAGNOSIS — E871 Hypo-osmolality and hyponatremia: Secondary | ICD-10-CM | POA: Diagnosis not present

## 2020-03-28 DIAGNOSIS — M7989 Other specified soft tissue disorders: Secondary | ICD-10-CM | POA: Diagnosis not present

## 2020-03-28 DIAGNOSIS — D62 Acute posthemorrhagic anemia: Secondary | ICD-10-CM | POA: Diagnosis not present

## 2020-03-28 DIAGNOSIS — R45851 Suicidal ideations: Secondary | ICD-10-CM | POA: Diagnosis not present

## 2020-03-28 DIAGNOSIS — R4182 Altered mental status, unspecified: Secondary | ICD-10-CM | POA: Diagnosis present

## 2020-03-28 DIAGNOSIS — J449 Chronic obstructive pulmonary disease, unspecified: Secondary | ICD-10-CM | POA: Diagnosis not present

## 2020-03-28 DIAGNOSIS — F1021 Alcohol dependence, in remission: Secondary | ICD-10-CM | POA: Diagnosis present

## 2020-03-28 DIAGNOSIS — K5909 Other constipation: Secondary | ICD-10-CM | POA: Diagnosis present

## 2020-03-28 DIAGNOSIS — M4856XA Collapsed vertebra, not elsewhere classified, lumbar region, initial encounter for fracture: Secondary | ICD-10-CM | POA: Diagnosis not present

## 2020-03-28 DIAGNOSIS — I4892 Unspecified atrial flutter: Secondary | ICD-10-CM | POA: Diagnosis not present

## 2020-03-28 DIAGNOSIS — D72829 Elevated white blood cell count, unspecified: Secondary | ICD-10-CM | POA: Diagnosis present

## 2020-03-28 DIAGNOSIS — I420 Dilated cardiomyopathy: Secondary | ICD-10-CM | POA: Diagnosis not present

## 2020-03-28 DIAGNOSIS — E440 Moderate protein-calorie malnutrition: Secondary | ICD-10-CM | POA: Diagnosis not present

## 2020-03-28 DIAGNOSIS — I77810 Thoracic aortic ectasia: Secondary | ICD-10-CM | POA: Diagnosis present

## 2020-03-28 DIAGNOSIS — J9811 Atelectasis: Secondary | ICD-10-CM | POA: Diagnosis not present

## 2020-03-28 DIAGNOSIS — G8929 Other chronic pain: Secondary | ICD-10-CM | POA: Diagnosis present

## 2020-03-28 DIAGNOSIS — R739 Hyperglycemia, unspecified: Secondary | ICD-10-CM

## 2020-03-28 DIAGNOSIS — Z9049 Acquired absence of other specified parts of digestive tract: Secondary | ICD-10-CM | POA: Diagnosis not present

## 2020-03-28 DIAGNOSIS — Z01810 Encounter for preprocedural cardiovascular examination: Secondary | ICD-10-CM | POA: Diagnosis not present

## 2020-03-28 DIAGNOSIS — Z20822 Contact with and (suspected) exposure to covid-19: Secondary | ICD-10-CM | POA: Diagnosis not present

## 2020-03-28 DIAGNOSIS — I214 Non-ST elevation (NSTEMI) myocardial infarction: Secondary | ICD-10-CM | POA: Diagnosis not present

## 2020-03-28 LAB — ECHOCARDIOGRAM COMPLETE
AR max vel: 1.67 cm2
AV Area VTI: 1.76 cm2
AV Area mean vel: 1.74 cm2
AV Mean grad: 12 mmHg
AV Peak grad: 22.3 mmHg
Ao pk vel: 2.36 m/s
Area-P 1/2: 6.12 cm2
Calc EF: 28.4 %
Height: 67 in
MV M vel: 5.04 m/s
MV Peak grad: 101.6 mmHg
P 1/2 time: 266 msec
Radius: 0.4 cm
S' Lateral: 4.4 cm
Single Plane A2C EF: 35.9 %
Single Plane A4C EF: 21.3 %
Weight: 2720 oz

## 2020-03-28 LAB — COMPREHENSIVE METABOLIC PANEL
ALT: 10 U/L (ref 0–44)
AST: 12 U/L — ABNORMAL LOW (ref 15–41)
Albumin: 3.1 g/dL — ABNORMAL LOW (ref 3.5–5.0)
Alkaline Phosphatase: 62 U/L (ref 38–126)
Anion gap: 11 (ref 5–15)
BUN: 9 mg/dL (ref 8–23)
CO2: 22 mmol/L (ref 22–32)
Calcium: 8.7 mg/dL — ABNORMAL LOW (ref 8.9–10.3)
Chloride: 96 mmol/L — ABNORMAL LOW (ref 98–111)
Creatinine, Ser: 0.52 mg/dL — ABNORMAL LOW (ref 0.61–1.24)
GFR calc Af Amer: >60 mL/min (ref >60)
GFR calc non Af Amer: >60 mL/min (ref >60)
Glucose, Bld: 286 mg/dL — ABNORMAL HIGH (ref 70–99)
Potassium: 4 mmol/L (ref 3.5–5.1)
Sodium: 129 mmol/L — ABNORMAL LOW (ref 135–145)
Total Bilirubin: 0.4 mg/dL (ref 0.3–1.2)
Total Protein: 6 g/dL — ABNORMAL LOW (ref 6.5–8.1)

## 2020-03-28 LAB — APTT: aPTT: 25 seconds (ref 24–36)

## 2020-03-28 LAB — CBC
HCT: 39.5 % (ref 39.0–52.0)
Hemoglobin: 13.5 g/dL (ref 13.0–17.0)
MCH: 29.5 pg (ref 26.0–34.0)
MCHC: 34.2 g/dL (ref 30.0–36.0)
MCV: 86.4 fL (ref 80.0–100.0)
Platelets: 350 10*3/uL (ref 150–400)
RBC: 4.57 MIL/uL (ref 4.22–5.81)
RDW: 13.3 % (ref 11.5–15.5)
WBC: 13.2 10*3/uL — ABNORMAL HIGH (ref 4.0–10.5)
nRBC: 0 % (ref 0.0–0.2)

## 2020-03-28 LAB — HEMOGLOBIN A1C
Hgb A1c MFr Bld: 10.2 % — ABNORMAL HIGH (ref 4.8–5.6)
Mean Plasma Glucose: 246.04 mg/dL

## 2020-03-28 LAB — VITAMIN B12: Vitamin B-12: 122 pg/mL — ABNORMAL LOW (ref 180–914)

## 2020-03-28 LAB — TSH: TSH: 1.982 u[IU]/mL (ref 0.350–4.500)

## 2020-03-28 LAB — TROPONIN I (HIGH SENSITIVITY)
Troponin I (High Sensitivity): 27 ng/L — ABNORMAL HIGH (ref <18)
Troponin I (High Sensitivity): 121 ng/L (ref <18)
Troponin I (High Sensitivity): 1479 ng/L (ref <18)
Troponin I (High Sensitivity): 1747 ng/L (ref <18)

## 2020-03-28 LAB — D-DIMER, QUANTITATIVE: D-Dimer, Quant: 0.84 ug/mL-FEU — ABNORMAL HIGH (ref 0.00–0.50)

## 2020-03-28 LAB — MAGNESIUM: Magnesium: 1.4 mg/dL — ABNORMAL LOW (ref 1.7–2.4)

## 2020-03-28 LAB — PROTIME-INR
INR: 1.1 (ref 0.8–1.2)
Prothrombin Time: 13.3 seconds (ref 11.4–15.2)

## 2020-03-28 LAB — LIPASE, BLOOD: Lipase: 17 U/L (ref 11–51)

## 2020-03-28 LAB — HIV ANTIBODY (ROUTINE TESTING W REFLEX): HIV Screen 4th Generation wRfx: NONREACTIVE

## 2020-03-28 LAB — FOLATE: Folate: 6.7 ng/mL (ref >5.9)

## 2020-03-28 LAB — SARS CORONAVIRUS 2 BY RT PCR (HOSPITAL ORDER, PERFORMED IN ~~LOC~~ HOSPITAL LAB): SARS Coronavirus 2: NEGATIVE

## 2020-03-28 MED ORDER — FUROSEMIDE 10 MG/ML IJ SOLN
40.0000 mg | Freq: Two times a day (BID) | INTRAMUSCULAR | Status: DC
  Administered 2020-03-29 – 2020-04-04 (×11): 40 mg via INTRAVENOUS
  Filled 2020-03-28 (×13): qty 4

## 2020-03-28 MED ORDER — NICOTINE 21 MG/24HR TD PT24
21.0000 mg | MEDICATED_PATCH | Freq: Every day | TRANSDERMAL | Status: DC
  Filled 2020-03-28 (×9): qty 1

## 2020-03-28 MED ORDER — THIAMINE HCL 100 MG/ML IJ SOLN
500.0000 mg | Freq: Three times a day (TID) | INTRAVENOUS | Status: AC
  Administered 2020-03-28 – 2020-03-29 (×5): 500 mg via INTRAVENOUS
  Filled 2020-03-28 (×9): qty 5

## 2020-03-28 MED ORDER — POLYETHYLENE GLYCOL 3350 17 G PO PACK
17.0000 g | PACK | Freq: Two times a day (BID) | ORAL | Status: DC
  Administered 2020-03-28 – 2020-04-03 (×9): 17 g via ORAL
  Filled 2020-03-28 (×13): qty 1

## 2020-03-28 MED ORDER — SENNOSIDES-DOCUSATE SODIUM 8.6-50 MG PO TABS
1.0000 | ORAL_TABLET | Freq: Two times a day (BID) | ORAL | Status: DC
  Administered 2020-03-28 – 2020-04-04 (×13): 1 via ORAL
  Filled 2020-03-28 (×15): qty 1

## 2020-03-28 MED ORDER — FUROSEMIDE 10 MG/ML IJ SOLN
40.0000 mg | Freq: Once | INTRAMUSCULAR | Status: AC
  Administered 2020-03-28: 40 mg via INTRAVENOUS

## 2020-03-28 MED ORDER — THIAMINE HCL 100 MG/ML IJ SOLN
500.0000 mg | Freq: Once | INTRAMUSCULAR | Status: AC
  Administered 2020-03-28: 500 mg via INTRAVENOUS
  Filled 2020-03-28: qty 6

## 2020-03-28 MED ORDER — SODIUM CHLORIDE 0.9 % IV BOLUS (SEPSIS)
1000.0000 mL | Freq: Once | INTRAVENOUS | Status: AC
  Administered 2020-03-28: 1000 mL via INTRAVENOUS

## 2020-03-28 MED ORDER — GLUCERNA SHAKE PO LIQD
237.0000 mL | Freq: Three times a day (TID) | ORAL | Status: DC
  Administered 2020-03-28 – 2020-04-02 (×13): 237 mL via ORAL
  Filled 2020-03-28 (×8): qty 237

## 2020-03-28 MED ORDER — LOSARTAN POTASSIUM 25 MG PO TABS
25.0000 mg | ORAL_TABLET | Freq: Every day | ORAL | Status: DC
  Administered 2020-03-29 – 2020-04-08 (×12): 25 mg via ORAL
  Filled 2020-03-28 (×12): qty 1

## 2020-03-28 MED ORDER — MAGNESIUM SULFATE 2 GM/50ML IV SOLN
2.0000 g | Freq: Once | INTRAVENOUS | Status: AC
  Administered 2020-03-28: 2 g via INTRAVENOUS
  Filled 2020-03-28: qty 50

## 2020-03-28 MED ORDER — TAB-A-VITE/IRON PO TABS: 1.0000 | ORAL_TABLET | Freq: Every day | ORAL | Status: DC

## 2020-03-28 MED ORDER — CHLORHEXIDINE GLUCONATE CLOTH 2 % EX PADS
6.0000 | MEDICATED_PAD | Freq: Every day | CUTANEOUS | Status: DC
  Administered 2020-03-28 – 2020-04-03 (×3): 6 via TOPICAL

## 2020-03-28 MED ORDER — THIAMINE HCL 100 MG/ML IJ SOLN
250.0000 mg | INTRAVENOUS | Status: AC
  Administered 2020-03-30 – 2020-04-02 (×5): 250 mg via INTRAVENOUS
  Filled 2020-03-28 (×6): qty 2.5

## 2020-03-28 MED ORDER — CYANOCOBALAMIN 1000 MCG/ML IJ SOLN
1000.0000 ug | Freq: Every day | INTRAMUSCULAR | Status: AC
  Administered 2020-03-28 – 2020-04-03 (×5): 1000 ug via INTRAMUSCULAR
  Filled 2020-03-28 (×7): qty 1

## 2020-03-28 MED ORDER — ACETAMINOPHEN 325 MG PO TABS
650.0000 mg | ORAL_TABLET | Freq: Four times a day (QID) | ORAL | Status: DC | PRN
  Administered 2020-03-30: 650 mg via ORAL
  Filled 2020-03-28: qty 2

## 2020-03-28 MED ORDER — ADULT MULTIVITAMIN W/MINERALS CH
1.0000 | ORAL_TABLET | Freq: Every day | ORAL | Status: DC
  Administered 2020-03-28 – 2020-04-08 (×11): 1 via ORAL
  Filled 2020-03-28 (×11): qty 1

## 2020-03-28 MED ORDER — SODIUM CHLORIDE 0.9 % IV SOLN
1000.0000 mL | INTRAVENOUS | Status: DC
  Administered 2020-03-28: 1000 mL via INTRAVENOUS

## 2020-03-28 MED ORDER — FUROSEMIDE 10 MG/ML IJ SOLN
INTRAMUSCULAR | Status: AC
  Filled 2020-03-28: qty 4

## 2020-03-28 MED ORDER — GADOBUTROL 1 MMOL/ML IV SOLN
6.5000 mL | Freq: Once | INTRAVENOUS | Status: AC | PRN
  Administered 2020-03-28: 6.5 mL via INTRAVENOUS

## 2020-03-28 MED ORDER — SPIRONOLACTONE 12.5 MG HALF TABLET
12.5000 mg | ORAL_TABLET | Freq: Every day | ORAL | Status: DC
  Administered 2020-03-29 – 2020-04-08 (×11): 12.5 mg via ORAL
  Filled 2020-03-28 (×12): qty 1

## 2020-03-28 NOTE — Consult Note (Addendum)
Cardiology Consultation:   Patient ID: Scott Rios MRN: [id number]; DOB: 29-Mar-1956  Admit date: 03/27/2020 Date of Consult: 03/28/2020  Primary Care Provider: Elfredia NevinsFusco, Lawrence, MD [facility] Cardiologist: None (NEW) [facility] Electrophysiologist:  None    Patient Profile:   Scott Rios is a 64 y.o. male with a hx of DM and HTN, ETOH abuse and tobacco abuse who is being seen today for the evaluation of acute pulmonary edema, LV dysfunction, severe MR and severe AR at the request of Marguerita Merlesmair Sheikh, MD.  History of Present Illness:   Mr. Scott Rios is a 64yo male with a hx of DM and HTN.  He has no hx of cardiac disease but has a hx of tobacco and ETOH abuse and quit 10 years ago.  He presented today to ER with complaints of a 2 month hx of right leg weakness.  He complained of difficulty in being able to stand after using the toilet last evening and he ended up crawling into the living room.  Right leg was numb and weak and with more difficulty in being able to raise the leg.  He complained of chronic constipation with last bowel movement pain 8 days ago, however there was no complaint of back pain, bowel or bladder changes.  Right hand was only significant for occasional tingling sensation.  He denied any fever, chills, chest pain, shortness of breath or having any sick contact.  In the emergency department, he was hemodynamically stable.  Work-up in the ED showed mild leukocytosis, hyponatremia, hyperglycemia, magnesium 1.4, troponin x 2 elevated at 27> 121, hypoalbuminemia.  SARS coronavirus was negative.  Initial chest x-ray showed borderline cardiomegaly with diffuse interstitial thickening suspicious for pulmonary edema, possible pulmonary nodule was also noted in the right midlung.  Patient suddenly became acutely hypoxic, tachypneic and gasping for breath when he returned from CT.  IV Lasix was given, IV fluids were stopped and patient was placed on BiPAP, repeat a chest x-ray was  consistent with mild diffuse pulmonary interstitial edema.  CT of head showed no acute acute abnormality.  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  IV thiamine 500 mg x 1 was given.  [facility].  He underwent Head MRI showing chronic microvascular changes and MRI of the spine showed congenital spinal canal narrowing with spondylosis (see full report).  He is now back from MRI.  He denies any hx of chest pain or pressure,  PND, orthopnea, LE edema, dizziness, palpitations or syncope. He does tell me that he has had SOB for a few days prior to admission along with LLE edema.  He continues to smoke but quit ETOH 10 years ago.  His father died of an MI in his 6560's.   Past Medical History:  Diagnosis Date  . Diabetes mellitus without complication (HCC)   . Hypertension     Past Surgical History:  Procedure Laterality Date  . CHOLECYSTECTOMY    . HERNIA REPAIR       Home Medications:  Prior to Admission medications   Not on File    Inpatient Medications: Scheduled Meds: . Chlorhexidine Gluconate Cloth  6 each Topical Daily  . cyanocobalamin  1,000 mcg Intramuscular Daily  . feeding supplement (GLUCERNA SHAKE)  237 mL Oral TID BM  . multivitamin with minerals  1 tablet Oral Daily  . nicotine  21 mg Transdermal Daily  . polyethylene glycol  17 g Oral BID  . senna-docusate  1 tablet Oral BID   Continuous Infusions: . [START ON 03/30/2020] thiamine injection    . thiamine injection 500 mg (03/28/20 1805)   PRN Meds: acetaminophen  Allergies:    Allergies  Allergen Reactions  . Penicillins      Unknown reaction was told it happened as a child    Social History:   Social History   Socioeconomic History  . Marital status: Widowed    Spouse name: Not on file  . Number of children: Not on file  . Years of  education: Not on file  . Highest education level: Not on file  Occupational History  . Not on file  Tobacco Use  . Smoking status: Current Every Day Smoker  . Smokeless tobacco: Never Used  Substance and Sexual Activity  . Alcohol use: No  . Drug use: No  . Sexual activity: Not on file  Other Topics Concern  . Not on file  Social History Narrative  . Not on file   Social Determinants of Health   Financial Resource Strain:   . Difficulty of Paying Living Expenses:   Food Insecurity:   . Worried About Programme researcher, broadcasting/film/video in the Last Year:   . Barista in the Last Year:   Transportation Needs:   . Freight forwarder (Medical):   Marland Kitchen Lack of Transportation (Non-Medical):   Physical Activity:   . Days of Exercise per Week:   . Minutes of Exercise per Session:   Stress:   . Feeling of Stress :   Social Connections:   . Frequency of Communication with Friends and Family:   . Frequency of Social Gatherings with Friends and Family:   . Attends Religious Services:   . Active Member of Clubs or Organizations:   . Attends Banker Meetings:   Marland Kitchen Marital Status:   Intimate Partner Violence:   . Fear of Current or Ex-Partner:   . Emotionally Abused:   Marland Kitchen Physically Abused:   . Sexually Abused:     Family History:   Family History  Problem Relation Age of Onset  . CAD Father        died of MI in 78's     ROS:  Please see the history of present illness.   All other ROS reviewed and negative.     Physical Exam/Data:   Vitals:   03/28/20 1420 03/28/20 1503 03/28/20 1555 03/28/20 1951  BP: (!) 149/84 137/68  133/75  Pulse: (!) 103   91  Resp: 20 20  16   Temp: 98.4 F (36.9 C) 97.7 F (36.5 C)  97.9 F (36.6 C)  TempSrc: Oral Oral  Oral  SpO2: 96% 97% 97% 97%  Weight: (!) 136.4 kg 67.2 kg    Height: 5\' 6"  (1.676 m) 5\' 7"  (1.702 m)      Intake/Output Summary (Last 24 hours) at 03/28/2020 2146 Last data filed at 03/28/2020 1525 Gross per 24 hour    Intake 100 ml  Output 1200 ml  Net -1100 ml   Last 3 Weights 03/28/2020 03/28/2020 03/27/2020  Weight (lbs) 148 lb 2.4 oz 300 lb 11.3 oz 170 lb  Weight (kg) 67.2 kg 136.4 kg 77.111 kg     Body mass index is 23.2 kg/m.  General:  Well nourished, well developed, in no acute distress HEENT: normal Lymph: no adenopathy Neck: no JVD Endocrine:  No thryomegaly Vascular: No carotid bruits; FA pulses 2+ bilaterally without bruits  Cardiac:  normal S1, S2; RRR; no murmur  Lungs: crackles at bases bilaterally Abd: soft, nontender, no hepatomegaly  Ext: 1+ pretibial edema LLE Musculoskeletal:  No deformities, BUE and BLE strength normal and equal Skin: warm and dry  Neuro:  CNs 2-12 intact, no focal abnormalities noted Psych:  Normal affect   EKG:  The EKG was personally reviewed and demonstrates:  NSR with nonspecific IVCD with repol changes and anterior infarct Telemetry:  Telemetry was personally reviewed and demonstrates:  NSR  Relevant CV Studies: 2D echo 03/28/2020  IMPRESSIONS    1. Left ventricular ejection fraction, by estimation, is 35 to 40%. The  left ventricle has moderately decreased function. The left ventricle  demonstrates regional wall motion abnormalities (see scoring  diagram/findings for description). The left  ventricular internal cavity size was mildly dilated. There is mild  concentric left ventricular hypertrophy. Left ventricular diastolic  parameters are consistent with Grade II diastolic dysfunction  (pseudonormalization). There is moderate hypokinesis of  the left ventricular, mid-apical inferoseptal wall and inferior wall.  There is severe hypokinesis of the left ventricular, mid-apical  anteroseptal wall.  2. Right ventricular systolic function is normal. The right ventricular  size is normal.  3. Left atrial size was severely dilated.  4. Right atrial size was mildly dilated.  5. Likely moderate eccentric MR.. The mitral valve is abnormal.  Moderate  mitral valve regurgitation.  6. Fixed right coronary cusp with prominent focal calcification. Suspect  bicuspid valve, but obscured by significant calcification. Severe aortic  regurgitation, best seen on apical images, with mitral valve anterior  leaflet restriction secondary to jet.  Vena contracta 0.65 cm on 5 chamber apical, 0.80 cm on 3 chamber apical,  fills most of LVOT and projects into majority of LV cavity (see image 83).  Some diastolic flow reversal seen in descending aorta but not well  visualized.. The aortic valve has an  indeterminant number of cusps. Aortic valve regurgitation is severe. Mild  aortic valve stenosis.  7. Aortic dilatation noted. There is borderline dilatation of the  ascending aorta measuring 38 mm.  8. The inferior vena cava is dilated in size with <50% respiratory  variability, suggesting right atrial pressure of 15 mmHg.    Laboratory Data:  High Sensitivity Troponin:   Recent Labs  Lab 03/27/20 2317 03/28/20 0324 03/28/20 0819 03/28/20 1006  TROPONINIHS 27* 121* 1,479* 1,747*     Chemistry Recent Labs  Lab 03/27/20 2317 03/28/20 0324  NA 126* 129*  K 3.6 4.0  CL 93* 96*  CO2 22 22  GLUCOSE 344* 286*  BUN 8 9  CREATININE 0.64 0.52*  CALCIUM 8.5* 8.7*  GFRNONAA >60 >60  GFRAA >60 >60  ANIONGAP 11 11    Recent Labs  Lab 03/27/20 2317 03/28/20 0324  PROT 5.7* 6.0*  ALBUMIN 3.1* 3.1*  AST 12* 12*  ALT 9 10  ALKPHOS 59 62  BILITOT 0.3 0.4   Hematology Recent Labs  Lab 03/27/20 2317 03/28/20 0324  WBC 10.6* 13.2*  RBC 4.30 4.57  HGB 12.9* 13.5  HCT 36.8* 39.5  MCV 85.6 86.4  MCH 30.0 29.5  MCHC 35.1 34.2  RDW 13.2 13.3  PLT 319 350   BNPNo results for input(s): BNP, PROBNP in the last 168 hours.  DDimer No results for input(s): DDIMER in the last 168 hours.   Radiology/Studies:  CT Head Wo Contrast  Result Date: 03/28/2020 CLINICAL  DATA:  Neuro deficit, acute, stroke suspected Patient reports  numbness and tingling of both legs for 2 weeks. EXAM: CT HEAD WITHOUT CONTRAST TECHNIQUE: Contiguous axial images were obtained from the base of the skull through the vertex without intravenous contrast. COMPARISON:  Head CT 09/29/2012 FINDINGS: Brain: Generalized atrophy with slight progression from prior exam. There is mild chronic small vessel ischemia. No intracranial hemorrhage, mass effect, or midline shift. No hydrocephalus. The basilar cisterns are patent. No evidence of territorial infarct or acute ischemia. No extra-axial or intracranial fluid collection. Vascular: Atherosclerosis of skullbase vasculature without hyperdense vessel or abnormal calcification. Skull: No fracture or focal lesion. Sinuses/Orbits: Paranasal sinuses and mastoid air cells are clear. The visualized orbits are unremarkable. Other: None. IMPRESSION: 1. No acute intracranial abnormality. 2. Generalized atrophy and chronic small vessel ischemia. Electronically Signed   By: Narda Rutherford M.D.   On: 03/28/2020 03:27   CT Lumbar Spine Wo Contrast  Result Date: 03/28/2020 CLINICAL DATA:  Initial evaluation for acute low back pain, lower extremity numbness and tingling. EXAM: CT LUMBAR SPINE WITHOUT CONTRAST TECHNIQUE: Multidetector CT imaging of the lumbar spine was performed without intravenous contrast administration. Multiplanar CT image reconstructions were also generated. COMPARISON:  None available. FINDINGS: Segmentation: Standard. Alignment: Physiologic.  No listhesis or malalignment. Vertebrae: Chronic compression deformity involving the superior endplate of L4 with mild 30% height loss and trace 2 mm bony retropulsion. Vertebral body height otherwise maintained. No acute osseous abnormality. Visualized sacrum and pelvis intact. SI joints approximated symmetric. No discrete or worrisome osseous lesions. Paraspinal and other soft tissues: Paraspinous soft tissues within normal limits. Moderate distension of the visualized  urinary bladder. Enlarged prostate noted. Advanced aorto bi-iliac atherosclerotic disease. Visualized visceral structures otherwise unremarkable. Disc levels: L1-2: Negative interspace. Minimal facet hypertrophy. No stenosis or impingement. L2-3: Mild circumferential disc bulge. Mild bilateral facet hypertrophy. No significant spinal stenosis. Foramina remain patent. L3-4: Mild diffuse disc bulge with disc desiccation. Trace bony retropulsion related to the L4 compression fracture. There is a superimposed broad left foraminal to extraforaminal disc protrusion, closely approximating and potentially affecting the exiting left L3 nerve root (series 4, image 60). Mild facet and ligament flavum hypertrophy. Resultant moderate spinal stenosis. Mild left L3 foraminal narrowing. No significant right foraminal encroachment. L4-5: Diffuse disc bulge. Moderate facet and ligament flavum hypertrophy. Resultant severe canal and bilateral lateral recess stenosis. Mild to moderate bilateral L4 foraminal narrowing. L5-S1: Mild disc bulge. Mild to moderate facet hypertrophy. No significant spinal stenosis. Foramina remain patent. IMPRESSION: 1. No acute abnormality within the lumbar spine. 2. Multifactorial degenerative changes at L4-5 with resultant severe spinal stenosis. 3. Left foraminal to extraforaminal disc protrusion at L3-4, potentially affecting the exiting left L3 nerve root. 4. Chronic L4 compression fracture with mild 30% height loss and trace 2 mm bony retropulsion. 5. Aortic Atherosclerosis (ICD10-I70.0). Electronically Signed   By: Rise Mu M.D.   On: 03/28/2020 03:34   MR Brain W and Wo Contrast  Result Date: 03/28/2020 CLINICAL DATA:  Neuro deficit EXAM: MRI HEAD WITHOUT AND WITH CONTRAST TECHNIQUE: Multiplanar, multiecho pulse sequences of the brain and surrounding structures were obtained without and with intravenous contrast. CONTRAST:  6.4mL GADAVIST GADOBUTROL 1 MMOL/ML IV SOLN COMPARISON:   03/28/2020 head CT and prior. FINDINGS: Please note that motion artifact limits evaluation. Brain: Scattered T2 hyperintense foci involving the periventricular/subcortical white matter and pons are nonspecific however commonly associated with chronic microvascular ischemic changes. No acute infarct or intracranial hemorrhage. No midline shift, ventriculomegaly  or extra-axial fluid collection. No mass lesion. No abnormal enhancement. Vascular: Normal flow voids. Skull and upper cervical spine: Normal marrow signal. Sinuses/Orbits: Normal orbits. Clear paranasal sinuses. Trace bilateral mastoid effusion. Other: None. IMPRESSION: No acute intracranial process. Mild chronic microvascular ischemic changes. Electronically Signed   By: Stana Bunting M.D.   On: 03/28/2020 17:39   MR Lumbar Spine W Wo Contrast  Result Date: 03/28/2020 CLINICAL DATA:  Low back pain EXAM: MRI LUMBAR SPINE WITHOUT AND WITH CONTRAST TECHNIQUE: Multiplanar and multiecho pulse sequences of the lumbar spine were obtained without and with intravenous contrast. CONTRAST:  6.41mL GADAVIST GADOBUTROL 1 MMOL/ML IV SOLN COMPARISON:  03/28/2020 CT L-spine. FINDINGS: Please note that motion artifact limits evaluation. Segmentation:  Standard. Alignment:  Straightening of lordosis.  No listhesis. Vertebrae: Normal bone marrow signal intensity. 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 Chronic superior L4 endplate fracture deformity with approximately 30% height loss. No significant retropulsion. No acute fracture. Conus medullaris and cauda equina: Conus extends to the L1 level. Conus and cauda equina appear normal. Disc levels: Multilevel desiccation, endplate osteophytosis and mild disc space loss. 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 L1-2: Disc bulge with superimposed right foraminal protrusion and bilateral facet hypertrophy. Patent spinal canal and bilateral neural foramen. L2-3: Disc bulge abutting the bilateral descending L3  nerve roots and bilateral facet hypertrophy. Patent spinal canal. Mild left and moderate right neural foraminal narrowing. L3-4: Disc bulge abutting the exiting L3 and descending L4 nerve roots. Superimposed left foraminal/extraforaminal protrusion. Ligamentum flavum and bilateral facet hypertrophy. Mild spinal canal and bilateral neural foraminal narrowing. L4-5: Disc bulge with superimposed bilateral extraforaminal protrusions. There is also a superimposed central protrusion. Ligamentum flavum and bilateral facet hypertrophy. Moderate to severe spinal canal narrowing. Moderate right and mild left neural foraminal narrowing. L5-S1: Disc bulge abutting the exiting bilateral L5 nerve roots with superimposed central protrusion. Bilateral facet hypertrophy. There is also abutment of the descending bilateral S1 nerve roots. Patent spinal canal and bilateral neural foramina. Paraspinal and other soft tissues: Paraspinal soft tissue within normal limits. 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 No acute osseous abnormality. Chronic L4 compression fracture deformity with mild height loss. Moderate to severe spinal canal narrowing at the L4-5 level. Moderate right L2-3, right L4-5 neural foraminal narrowing. L3-4 disc bulge abutment of the exiting L3, descending L4 nerve roots. L5-S1 disc bulge abutment of the exiting L5 and descending S1 nerve roots. Electronically Signed   By: Stana Bunting M.D.   On: 03/28/2020 17:58   DG Chest Portable 1 View  Result Date: 03/28/2020 CLINICAL DATA:  Initial evaluation for acute onset shortness of breath. EXAM: PORTABLE CHEST 1 VIEW COMPARISON:  Prior radiograph from 03/28/2020. FINDINGS: Cardiomegaly, stable. Mediastinal silhouette within normal limits. Aortic atherosclerosis. Lungs well inflated. Suspected underlying emphysematous changes. Superimposed interstitial prominence with a few scattered Kerley B lines, suggestive of a degree of  superimposed interstitial edema. No consolidative airspace disease. No definite pleural effusion. No pneumothorax. 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 No acute osseous finding. IMPRESSION: 1. Cardiomegaly with mild diffuse pulmonary interstitial edema. 2. Suspected underlying emphysema. 3.  Aortic Atherosclerosis (ICD10-I70.0). Electronically Signed   By: Rise Mu M.D.   On: 03/28/2020 04:04   DG Chest Portable 1 View  Result Date: 03/28/2020 CLINICAL DATA:  Weakness. Patient reports numbness and tingling of both legs. EXAM: PORTABLE CHEST 1 VIEW COMPARISON:  Radiograph 09/29/2012 FINDINGS: Borderline cardiomegaly. Mild aortic tortuosity. Diffuse interstitial thickening with  suggestion of Kerley B-lines. Possible nodular density in the right mid lung, indeterminate. No evidence of pneumonia. No significant pleural effusion. No pneumothorax. Stable sclerotic density in the left proximal humerus likely an enchondroma. IMPRESSION: 1. Borderline cardiomegaly with diffuse interstitial thickening, suspicious for pulmonary edema. 2. Possible pulmonary nodule in the right mid lung. Consider chest CT on an elective basis for further evaluation. Electronically Signed   By: Narda Rutherford M.D.   On: 03/28/2020 00:41   ECHOCARDIOGRAM COMPLETE  Result Date: 03/28/2020    ECHOCARDIOGRAM REPORT   Patient Name:   JAVAUN DIMPERIO Date of Exam: 03/28/2020 Medical Rec #:  [id number]      Height:       67.0 in Accession #:    [id number]     Weight:       170.0 lb Date of Birth:  Mar 18, 1956      BSA:          1.887 m Patient Age:    64 years       BP:           128/68 mmHg Patient Gender: M              HR:           89 bpm. Exam Location:  Inpatient Procedure: 2D Echo STAT ECHO Indications:    elevated troponin  History:        Patient has no prior history of Echocardiogram examinations.                 Signs/Symptoms:elevated troponin; Risk Factors:Hypertension,                  Diabetes and Current Smoker.  Sonographer:    Delcie Roch RDCS Referring Phys: [id number] OLADAPO ADEFESO IMPRESSIONS  1. Left ventricular ejection fraction, by estimation, is 35 to 40%. The left ventricle has moderately decreased function. The left ventricle demonstrates regional wall motion abnormalities (see scoring diagram/findings for description). The left ventricular internal cavity size was mildly dilated. There is mild concentric left ventricular hypertrophy. Left ventricular diastolic parameters are consistent with Grade II diastolic dysfunction (pseudonormalization). There is moderate hypokinesis of the left ventricular, mid-apical inferoseptal wall and inferior wall. There is severe hypokinesis of the left ventricular, mid-apical anteroseptal wall.  2. Right ventricular systolic function is normal. The right ventricular size is normal.  3. Left atrial size was severely dilated.  4. Right atrial size was mildly dilated.  5. Likely moderate eccentric MR.. The mitral valve is abnormal. Moderate mitral valve regurgitation.  6. Fixed right coronary cusp with prominent focal calcification. Suspect bicuspid valve, but obscured by significant calcification. Severe aortic regurgitation, best seen on apical images, with mitral valve anterior leaflet restriction secondary to jet.  Vena contracta 0.65 cm on 5 chamber apical, 0.80 cm on 3 chamber apical, fills most of LVOT and projects into majority of LV cavity (see image 83). Some diastolic flow reversal seen in descending aorta but not well visualized.. The aortic valve has an indeterminant number of cusps. Aortic valve regurgitation is severe. Mild aortic valve stenosis.  7. Aortic dilatation noted. There is borderline dilatation of the ascending aorta measuring 38 mm.  8. The inferior vena cava is dilated in size with <50% respiratory variability, suggesting right atrial pressure of 15 mmHg. Comparison(s): No prior Echocardiogram.  Conclusion(s)/Recommendation(s): Abnormal study. Reduced EF with both global and focal hypokinesis; mild global hypokinesis, with moderate wall motion abnormalities in the mid to apical inferoseptal and inferior  wall, with severe hypokinesis in the mid to apical anteroseptal walls. Also with severe aortic regurgitation, calcified and likely bicuspid aortic valve with mild stenosis. FINDINGS  Left Ventricle: Left ventricular ejection fraction, by estimation, is 35 to 40%. The left ventricle has moderately decreased function. The left ventricle demonstrates regional wall motion abnormalities. Moderate hypokinesis of the left ventricular, mid-apical inferoseptal wall and inferior wall. Severe hypokinesis of the left ventricular, mid-apical anteroseptal wall. The left ventricular internal cavity size was mildly dilated. There is mild concentric left ventricular hypertrophy. Left ventricular diastolic parameters are consistent with Grade II diastolic dysfunction (pseudonormalization).  LV Wall Scoring: The mid anteroseptal segment is akinetic. The mid and distal inferior wall, basal anteroseptal segment, mid inferoseptal segment, apical septal segment, and apex are hypokinetic. The entire anterior wall, entire lateral wall, basal inferior segment, and basal inferoseptal segment are normal. Right Ventricle: The right ventricular size is normal. No increase in right ventricular wall thickness. Right ventricular systolic function is normal. Left Atrium: Left atrial size was severely dilated. Right Atrium: Right atrial size was mildly dilated. Pericardium: There is no evidence of pericardial effusion. Mitral Valve: Likely moderate eccentric MR. The mitral valve is abnormal. Moderate mitral annular calcification. Moderate mitral valve regurgitation. Tricuspid Valve: The tricuspid valve is normal in structure. Tricuspid valve regurgitation is trivial. No evidence of tricuspid stenosis. Aortic Valve: Fixed right coronary cusp  with prominent focal calcification. Suspect bicuspid valve, but obscured by significant calcification. Severe aortic regurgitation, best seen on apical images, with mitral valve anterior leaflet restriction secondary to jet. Vena contracta 0.65 cm on 5 chamber apical, 0.80 cm on 3 chamber apical, fills most of LVOT and projects into majority of LV cavity (see image 83). Some diastolic flow reversal seen in descending aorta but not well visualized. The aortic valve has an indeterminant number of cusps. . There is moderate thickening and moderate calcification of the aortic valve. Aortic valve regurgitation is severe. Aortic regurgitation PHT measures 266 msec. Mild aortic stenosis is present. There is moderate thickening of the aortic valve. There is moderate calcification of the aortic valve. Aortic valve mean gradient measures 12.0 mmHg. Aortic valve peak gradient measures 22.3 mmHg. Aortic valve area, by VTI measures 1.76 cm. Pulmonic Valve: The pulmonic valve was not well visualized. Pulmonic valve regurgitation is not visualized. Aorta: Aortic dilatation noted. There is borderline dilatation of the ascending aorta measuring 38 mm. Venous: The inferior vena cava is dilated in size with less than 50% respiratory variability, suggesting right atrial pressure of 15 mmHg. IAS/Shunts: No atrial level shunt detected by color flow Doppler.  LEFT VENTRICLE PLAX 2D LVIDd:         5.54 cm      Diastology LVIDs:         4.40 cm      LV e' lateral:   5.00 cm/s LV PW:         1.23 cm      LV E/e' lateral: 21.4 LV IVS:        1.18 cm LVOT diam:     2.10 cm LV SV:         70 LV SV Index:   37 LVOT Area:     3.46 cm  LV Volumes (MOD) LV vol d, MOD A2C: 117.0 ml LV vol d, MOD A4C: 106.0 ml LV vol s, MOD A2C: 75.0 ml LV vol s, MOD A4C: 83.4 ml LV SV MOD A2C:     42.0 ml LV SV MOD A4C:  106.0 ml LV SV MOD BP:      32.1 ml RIGHT VENTRICLE RV S prime:     13.20 cm/s TAPSE (M-mode): 2.7 cm LEFT ATRIUM             Index        RIGHT ATRIUM           Index LA diam:        4.30 cm 2.28 cm/m  RA Area:     14.80 cm LA Vol (A2C):   57.4 ml 30.41 ml/m RA Volume:   35.30 ml  18.70 ml/m LA Vol (A4C):   81.8 ml 43.34 ml/m LA Biplane Vol: 68.7 ml 36.40 ml/m  AORTIC VALVE AV Area (Vmax):    1.67 cm AV Area (Vmean):   1.74 cm AV Area (VTI):     1.76 cm AV Vmax:           236.00 cm/s AV Vmean:          158.500 cm/s AV VTI:            0.401 m AV Peak Grad:      22.3 mmHg AV Mean Grad:      12.0 mmHg LVOT Vmax:         113.50 cm/s LVOT Vmean:        79.550 cm/s LVOT VTI:          0.204 m LVOT/AV VTI ratio: 0.51 AI PHT:            266 msec  AORTA Ao Root diam: 3.60 cm Ao Asc diam:  3.80 cm MITRAL VALVE MV Area (PHT): 6.12 cm     SHUNTS MV Decel Time: 124 msec     Systemic VTI:  0.20 m MR Peak grad:   101.6 mmHg  Systemic Diam: 2.10 cm MR Mean grad:   63.0 mmHg MR Vmax:        504.00 cm/s MR Vmean:       376.0 cm/s MR PISA:        1.01 cm MR PISA Radius: 0.40 cm MV E velocity: 107.00 cm/s MV A velocity: 69.40 cm/s MV E/A ratio:  1.54 Jodelle Red MD Electronically signed by Jodelle Red MD Signature Date/Time: 03/28/2020/12:53:56 PM    Final     ECG 8/21 1119 sinus    ECG 8/21 1119 @ 90  18/12/36 Minimal J point elevation V1-3 Qw V1-4 ( atypical LBBB)  New T wave inversions in I and aVL  0500  SR @ 90 16/12/ QWV1-3  IVCD STdep V5/6  3/15 Sinus with IVCD ST seg normal  Assessment and Plan:   1.  Acute Respiratory Failure with flash pulmonary edema due to Acute systolic CHF -this occurred after IV contrast -underlying DCM with focal wall motion abnormalities noted on echo -suspect that he has underlying CAD given echo findings as well as hx of tobacco use, DM and HTN -suspect that MR and AR are contributing -will need further workup with right and left heart cath -O2 sats currently 97% on RA and BP stable at 137/15mmHg -will start Losartan  daily and spiro 12.5mg  daily -Chest xray with diffuse  interstitial edema -start Lasix  IV BID -follow strict I&O's and daily weights -BMET in am  2.  Elevated troponin -unclear whether this is type I or type 2 -he has not had any anginal sx but trend in hsTrop c/w ACS (16>109>6045>4098) -continue to trend trop until it peaks -elevation may be due to acute  pulmonary edema in the setting of undx DCM but also could be due to CAD  3.  DCM -EF 35-40 with wall motion abnormalities/severe LAE -unclear duration as patient has been asymptomatic from a cardiac standpoint -TSH normal -hemodynamically stable so will start low dose ARB and spiro  4.  AR-severe -echo with possible bicuspid AV with associated mild AS.   -RCC is fixed with prominent focal calcification with associated severe AI -will need TEE for further evaluation  5. Moderate MR -there is anterior MV leaflet restriction due to severe AI jet -likely exacerbated by DCM  6.  Left leg weakness -unclear etiology -lumbar CT with significant DJD at L4-L5 and severe spinal stenosis -per neuro  7.  Tobacco abuse -ongoing -smoking cessation per nursing  8.  DM -he has not been on any meds -HbA1C 10.2% -check FLP in am -should be on statin and ARB  9.  Hyponatremia -likely related to CHF -should improve with diuresis -fluid restrict to 1.5L -repeat BMET in am  10.  Hypomagnesemia -Mag 1.4 -replete to keep > 2 per TR[facility]  11.  DOE/LLE edema -will check a DDimer and LE venous doppler since this is new starting a few days ago  For questions or updates, please contact [facility] Please consult [email or web address] for contact info under    Signed, Armanda Magic, MD  03/28/2020 9:46 PM

## 2020-03-28 NOTE — Progress Notes (Signed)
  Echocardiogram 2D Echocardiogram has been performed.  Delcie Roch 03/28/2020, 12:32 PM

## 2020-03-28 NOTE — H&P (Signed)
History and Physical  Scott Rios WUJ:[id number] DOB: [birth year] DOA: 03/27/2020  Referring physician: Glynn Octaveancour, Stephen, MD PCP: Elfredia NevinsFusco, Lawrence, MD  Patient coming from: Home  Chief Complaint: Right leg weakness  HPI: Scott Rios is a 64 y.o. male with medical history significant for hypertension and diabetes not on home medication, tobacco abuse and prior history of alcohol abuse (quit 10 years ago) who presents to the emergency department due to 3673-month history of right leg weakness.  Patient states that the right leg sometimes go out on him.  He complained of difficulty in being able to stand after using the toilet last evening and he ended up crawling into the living room.  Right leg was numb and weak and with more difficulty in being able to raise the leg.  He complained of chronic constipation with last bowel movement pain 8 days ago, however there was no complaint of back pain, bowel or bladder changes.  Right hand was only significant for occasional tingling sensation.  He denies fever, chills, chest pain, shortness of breath or having any sick contact.  ED Course:  In the emergency department, he was hemodynamically stable.  Work-up in the ED showed mild leukocytosis, hyponatremia, hyperglycemia, magnesium 1.4, troponin x 2 27> 121, hypoalbuminemia.  SARS coronavirus was negative.  Initial chest x-ray showed borderline cardiomegaly with diffuse interstitial thickening suspicious for pulmonary edema, possible pulmonary nodule was also noted in the right midlung.  Patient suddenly became acutely hypoxic, tachypneic and gasping for breath when he returned from CT.  IV Lasix was given, IV fluids were stopped and patient was placed on BiPAP, repeat a chest x-ray was consistent with mild diffuse pulmonary interstitial edema.  CT of head showed no acute acute abnormality.  CT lumbar spine without contrast showed no acute abnormality within the lumbar spine but did show multifactorial  degenerative changes at L4-5 with resultant severe spinal stenosis.  IV thiamine 500 mg x 1 was given.  Hospitalist was asked to admit patient for further evaluation and management.  Review of Systems: Constitutional: Negative for chills and fever.  HENT: Negative for ear pain and sore throat.   Eyes: Negative for pain and visual disturbance.  Respiratory: Negative for cough, chest tightness and shortness of breath.   Cardiovascular: Negative for chest pain and palpitations.  Gastrointestinal: Negative for abdominal pain and vomiting.  Endocrine: Negative for polyphagia and polyuria.  Genitourinary: Negative for decreased urine volume, dysuria Musculoskeletal: Positive for low back pain  Skin: Negative for color change and rash.  Allergic/Immunologic: Negative for immunocompromised state.  Neurological: Positive for right leg weakness and numbness.  Negative for tremors, syncope, speech difficulty Hematological: Does not bruise/bleed easily.  All other systems reviewed and are negative   Past Medical History:  Diagnosis Date  . Diabetes mellitus without complication (HCC)   . Hypertension    Past Surgical History:  Procedure Laterality Date  . CHOLECYSTECTOMY    . HERNIA REPAIR      Social History:  reports that he has been smoking. He has never used smokeless tobacco. He reports that he does not drink alcohol and does not use drugs.   Allergies  Allergen Reactions  . Penicillins     No family history on file.    Prior to Admission medications   Not on File    Physical Exam: BP 128/68   Pulse 89   Temp 97.8 F (36.6 C) (Oral)   Resp 20   Ht 5\' 7"  (1.702 m)  Wt 77.1 kg   SpO2 99%   BMI 26.63 kg/m   . General: 64 y.o. year-old male well developed well nourished in no acute distress.  Alert and oriented x3. . Cardiovascular: Regular rate and rhythm with no rubs or gallops.  No thyromegaly or JVD noted.  No lower extremity edema. 2/4 pulses in all 4  extremities. Marland Kitchen Respiratory: Clear to auscultation with no wheezes or rales. Good inspiratory effort. . Abdomen: Soft nontender nondistended with normal bowel sounds x4 quadrants. . Muskuloskeletal: No cyanosis, clubbing or edema noted bilaterally . Neuro:NIHSS 2 (right leg motor drift  +1, right leg mild to moderate sensory loss +1 ), CN II-XII intact, strength-RLE 2/5, LLE 5/5 . Skin: No ulcerative lesions noted or rashes . Psychiatry: Judgement and insight appear normal. Mood is appropriate for condition and setting          Labs on Admission:  Basic Metabolic Panel: Recent Labs  Lab 03/27/20 2317 03/28/20 0324  NA 126* 129*  K 3.6 4.0  CL 93* 96*  CO2 22 22  GLUCOSE 344* 286*  BUN 8 9  CREATININE 0.64 0.52*  CALCIUM 8.5* 8.7*  MG 1.4*  --    Liver Function Tests: Recent Labs  Lab 03/27/20 2317 03/28/20 0324  AST 12* 12*  ALT 9 10  ALKPHOS 59 62  BILITOT 0.3 0.4  PROT 5.7* 6.0*  ALBUMIN 3.1* 3.1*   Recent Labs  Lab 03/27/20 2317  LIPASE 17   No results for input(s): AMMONIA in the last 168 hours. CBC: Recent Labs  Lab 03/27/20 2317 03/28/20 0324  WBC 10.6* 13.2*  NEUTROABS 7.5  --   HGB 12.9* 13.5  HCT 36.8* 39.5  MCV 85.6 86.4  PLT 319 350   Cardiac Enzymes: No results for input(s): CKTOTAL, CKMB, CKMBINDEX, TROPONINI in the last 168 hours.  BNP (last 3 results) No results for input(s): BNP in the last 8760 hours.  ProBNP (last 3 results) No results for input(s): PROBNP in the last 8760 hours.  CBG: No results for input(s): GLUCAP in the last 168 hours.  Radiological Exams on Admission: CT Head Wo Contrast  Result Date: 03/28/2020 CLINICAL DATA:  Neuro deficit, acute, stroke suspected Patient reports numbness and tingling of both legs for 2 weeks. EXAM: CT HEAD WITHOUT CONTRAST TECHNIQUE: Contiguous axial images were obtained from the base of the skull through the vertex without intravenous contrast. COMPARISON:  Head CT 09/29/2012 FINDINGS:  Brain: Generalized atrophy with slight progression from prior exam. There is mild chronic small vessel ischemia. No intracranial hemorrhage, mass effect, or midline shift. No hydrocephalus. The basilar cisterns are patent. No evidence of territorial infarct or acute ischemia. No extra-axial or intracranial fluid collection. Vascular: Atherosclerosis of skullbase vasculature without hyperdense vessel or abnormal calcification. Skull: No fracture or focal lesion. Sinuses/Orbits: Paranasal sinuses and mastoid air cells are clear. The visualized orbits are unremarkable. Other: None. IMPRESSION: 1. No acute intracranial abnormality. 2. Generalized atrophy and chronic small vessel ischemia. Electronically Signed   By: Narda Rutherford M.D.   On: 03/28/2020 03:27   CT Lumbar Spine Wo Contrast  Result Date: 03/28/2020 CLINICAL DATA:  Initial evaluation for acute low back pain, lower extremity numbness and tingling. EXAM: CT LUMBAR SPINE WITHOUT CONTRAST TECHNIQUE: Multidetector CT imaging of the lumbar spine was performed without intravenous contrast administration. Multiplanar CT image reconstructions were also generated. COMPARISON:  None available. FINDINGS: Segmentation: Standard. Alignment: Physiologic.  No listhesis or malalignment. Vertebrae: Chronic compression deformity involving the superior  endplate of L4 with mild 30% height loss and trace 2 mm bony retropulsion. Vertebral body height otherwise maintained. No acute osseous abnormality. Visualized sacrum and pelvis intact. SI joints approximated symmetric. No discrete or worrisome osseous lesions. Paraspinal and other soft tissues: Paraspinous soft tissues within normal limits. Moderate distension of the visualized urinary bladder. Enlarged prostate noted. Advanced aorto bi-iliac atherosclerotic disease. Visualized visceral structures otherwise unremarkable. Disc levels: L1-2: Negative interspace. Minimal facet hypertrophy. No stenosis or impingement. L2-3:  Mild circumferential disc bulge. Mild bilateral facet hypertrophy. No significant spinal stenosis. Foramina remain patent. L3-4: Mild diffuse disc bulge with disc desiccation. Trace bony retropulsion related to the L4 compression fracture. There is a superimposed broad left foraminal to extraforaminal disc protrusion, closely approximating and potentially affecting the exiting left L3 nerve root (series 4, image 60). Mild facet and ligament flavum hypertrophy. Resultant moderate spinal stenosis. Mild left L3 foraminal narrowing. No significant right foraminal encroachment. L4-5: Diffuse disc bulge. Moderate facet and ligament flavum hypertrophy. Resultant severe canal and bilateral lateral recess stenosis. Mild to moderate bilateral L4 foraminal narrowing. L5-S1: Mild disc bulge. Mild to moderate facet hypertrophy. No significant spinal stenosis. Foramina remain patent. IMPRESSION: 1. No acute abnormality within the lumbar spine. 2. Multifactorial degenerative changes at L4-5 with resultant severe spinal stenosis. 3. Left foraminal to extraforaminal disc protrusion at L3-4, potentially affecting the exiting left L3 nerve root. 4. Chronic L4 compression fracture with mild 30% height loss and trace 2 mm bony retropulsion. 5. Aortic Atherosclerosis (ICD10-I70.0). Electronically Signed   By: Rise Mu M.D.   On: 03/28/2020 03:34   DG Chest Portable 1 View  Result Date: 03/28/2020 CLINICAL DATA:  Initial evaluation for acute onset shortness of breath. EXAM: PORTABLE CHEST 1 VIEW COMPARISON:  Prior radiograph from 03/28/2020. FINDINGS: Cardiomegaly, stable. Mediastinal silhouette within normal limits. Aortic atherosclerosis. Lungs well inflated. Suspected underlying emphysematous changes. Superimposed interstitial prominence with a few scattered Kerley B lines, suggestive of a degree of superimposed interstitial edema. No consolidative airspace disease. No definite pleural effusion. No pneumothorax.  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 No acute osseous finding. IMPRESSION: 1. Cardiomegaly with mild diffuse pulmonary interstitial edema. 2. Suspected underlying emphysema. 3.  Aortic Atherosclerosis (ICD10-I70.0). Electronically Signed   By: Rise Mu M.D.   On: 03/28/2020 04:04   DG Chest Portable 1 View  Result Date: 03/28/2020 CLINICAL DATA:  Weakness. Patient reports numbness and tingling of both legs. EXAM: PORTABLE CHEST 1 VIEW COMPARISON:  Radiograph 09/29/2012 FINDINGS: Borderline cardiomegaly. Mild aortic tortuosity. Diffuse interstitial thickening with suggestion of Kerley B-lines. Possible nodular density in the right mid lung, indeterminate. No evidence of pneumonia. No significant pleural effusion. No pneumothorax. Stable sclerotic density in the left proximal humerus likely an enchondroma. IMPRESSION: 1. Borderline cardiomegaly with diffuse interstitial thickening, suspicious for pulmonary edema. 2. Possible pulmonary nodule in the right mid lung. Consider chest CT on an elective basis for further evaluation. Electronically Signed   By: Narda Rutherford M.D.   On: 03/28/2020 00:41    EKG: I independently viewed the EKG done and my findings are as followed: Normal sinus rhythm at rate of 97 bpm with LBBB  Assessment/Plan Present on Admission: . Right leg weakness  Principal Problem:   Right leg weakness Active Problems:   Flash pulmonary edema (HCC)   Hypomagnesemia   Hypoalbuminemia   Hyperglycemia   Hyponatremia   Elevated troponin   Pulmonary nodule   Acute respiratory failure with hypoxia (HCC)  Tobacco abuse   Right leg weakness rule out structural lesion in brain/lumbar spine, thiamine deficiency, CIDP CT of head showed no acute acute abnormality.   CT lumbar spine without contrast showed no acute abnormality within the lumbar spine but did show multifactorial degenerative changes at L4-5 with resultant severe  spinal stenosis. Continue IV thiamine 500 mg 3 times a day for 2 consecutive days, then continue IV thiamine 250mg  daily for 5 days (combination with other B vitamins) Then continue terminal dribbling well p.o. daily Nonemergent MRI brain and lumbar spine with and without contrast will be done per teleneurologist recommendation. TSH and B12 will be checked  Neurology will be consulted for follow-up and further recommendation Since there is no MRI here at [facility], patient will be transferred to [facility] for MRI and will follow up with neurology on arrival at [facility].  Acute respiratory failure with hypoxia requiring NIPPV secondary to flash pulmonary edema Patient went into acute respiratory distress on arrival from CT He was placed on BiPAP, IV Lasix 40 Mg x1 was given with subsequent improvement Echocardiogram will be checked Cardiology will be consulted  Elevated troponin possibly secondary to flash pulmonary edema  Troponin 27 > 121 EKG shows sinus rhythm at rate of 90 bpm with no significant change from prior EKG done on arrival. This may be due to type II demand ischemia vs NSTEMI Continue to trend troponin Cardiology will be consulted for further recommendation.  Hypomagnesemia Mg 1.4, this will be replenished  Hypoalbuminemia possibly secondary to mild protein calorie malnutrition About 3.1; Protein supplement will be provided  Hyperglycemia with no known history of type 2 diabetes mellitus Blood glucose level was 344, hemoglobin A1c will be checked  Incidental pulmonary nodule Chest x-ray showed possible pulmonary nodule in the right mid lung CT chest per recommendation will be considered when patient is more stable, considering that he developed flash pulmonary edema when he had CT of lumbar spine.  Low back pain CT lumbar spine without contrast showed no acute abnormality within the lumbar spine, but it showed:  Multifactorial degenerative changes at L4-5 with resultant severe  spinal stenosis.  Left foraminal to extraforaminal disc protrusion at L3-4, potentially affecting the exiting left L3 nerve root. Chronic L4 compression fracture with mild 30% height loss and trace 2 mm bony retropulsion. Continue Tylenol as needed for back pain Continue PT/OT eval and treat.  Tobacco abuse Patient counseled on tobacco abuse cessation and he acknowledged understanding discussion Continue nicotine patch  DVT prophylaxis: SCD pending MRI of brain  Code Status: Full code  Family Communication: None at bedside  Disposition Plan:  Patient is from:                        home Anticipated DC to:                   SNF or family members home Anticipated DC date:               2-3 days Anticipated DC barriers:         Patient unstable to discharge at this time due to right leg weakness, flash pulmonary edema requiring BiPAP and pending cardiology consult.   Consults called: Cardiology  Admission status: Inpatient    [facility] MD [facility]  If 7PM-7AM, please contact night-coverage [email or web address] Password [facility]  03/28/2020, 7:49 AM

## 2020-03-28 NOTE — ED Notes (Signed)
Date and time results received: 03/28/20 4:45 AM  (use smartphrase ".now" to insert current time)  Test:troponin Critical Value: 121  Name of Provider Notified: Dr Manus Gunning  Orders Received? Or Actions Taken?: see chart

## 2020-03-28 NOTE — Progress Notes (Signed)
Patient was taken off BIPAP since he felt better. Patient stated BIPAP really did help with his SOB. RT placed him on 3lpm [place] for comfort and will continue to monitor him for any changes.

## 2020-03-28 NOTE — Consult Note (Signed)
[facility] Tele[facility]urology Consult Services  Stat Consult  Date of Service:   03/28/2020 00:14:21  Impression:     .  R53.1 - Weakness  Comments/Sign-Out: 64 year old male with a history of diabetes, not on any medical treatment, and heavy alcohol use reportedly quit 10 years ago as well as active tobacco use who presents due to right leg weakness. His right leg has been bothering him and going out on him for at least 2 months. However, this evening, he was on the toilet for a while and then could not stand back up. He ended up rolling off and crawling into the living room. His right leg is still feeling weaker and more numb than usual. He denies any significant back pain. He denies any bowel or bladder changes. He does have chronic constipation and has not gone in 8 days. He says he misses how active he used to be before he retired 3 years ago. He gets occasional tingling in the right hand but otherwise no other symptoms in the upper extremities. He does not take any medications. BP mildly elevated. NIH 3 with some disorientation to time and RLE drift and sensory deficit. Also with some abnormalities in his saccades. No DTRs could be elicited; no Babinski or clonus on exam. CTH unrevealing. IMPRESSION: right leg weakness, wide differential. Ddx includes thiamine deficiency, structural lesion in brain or lumbar spine, CIDP.  CT HEAD: Showed No Acute Hemorrhage or Acute Core Infarct  Metrics: [facility] Notification Time: 03/28/2020 00:13:46 Stamp Time: 03/28/2020 00:14:21 Callback Response Time: 03/28/2020 00:17:27  Our recommendations are outlined below.  Recommendations:     .  IV Thiamine 500mg  three times a day for 2 consecutive days then 250mg  IV daily for 5 days (in combination with other B vitamins) then 100mg  daily PO.      Nonemergent MRI brain and L spine with and without contrast    Therapies:     .  Physical Therapy  Other WorkUp:     .  Check B12  level     .  Check TSH  Disposition: Neurology Follow Up Recommended  Sign Out:     .  Discussed with Emergency Department Provider  ----------------------------------------------------------------------------------------------------  Chief Complaint: right leg weakness  History of Present Illness: Patient is a 64 year old Male.  64 year old male with a history of diabetes, not on any medical treatment, and heavy alcohol use reportedly quit 10 years ago as well as active tobacco use who presents due to right leg weakness. His right leg has been bothering him and going out on him for at least 2 months. However, this evening, he was on the toilet for a while and then could not stand back up. He ended up rolling off and crawling into the living room. His right leg is still feeling weaker and more numb than usual. He denies any significant back pain. He denies any bowel or bladder changes. He does have chronic constipation and has not gone in 8 days. He says he misses how active he used to be before he retired 3 years ago. He gets occasional tingling in the right hand but otherwise no other symptoms in the upper extremities. He does not take any medications.    Past Medical History:     . Diabetes Mellitus     . There is NO history of Hypertension     . There is NO history of Hyperlipidemia     . There is NO history of Atrial  Fibrillation     . There is NO history of Coronary Artery Disease     . There is NO history of Stroke  Anticoagulant use:  No  Antiplatelet use: No     Examination: BP(134/69), Pulse(97), Blood Glucose(344) 1A: Level of Consciousness - Alert; keenly responsive + 0 1B: Ask Month and Age - 1 Question Right + 1 1C: Blink Eyes & Squeeze Hands - Performs Both Tasks + 0 2: Test Horizontal Extraocular Movements - Normal + 0 3: Test Visual Fields - No Visual Loss + 0 4: Test Facial Palsy (Use Grimace if Obtunded) - Normal symmetry + 0 5A: Test Left Arm Motor Drift  - No Drift for 10 Seconds + 0 5B: Test Right Arm Motor Drift - No Drift for 10 Seconds + 0 6A: Test Left Leg Motor Drift - No Drift for 5 Seconds + 0 6B: Test Right Leg Motor Drift - Drift, but doesn't hit bed + 1 7: Test Limb Ataxia (FNF/Heel-Shin) - No Ataxia + 0 8: Test Sensation - Mild-Moderate Loss: Can Sense Being Touched + 1 9: Test Language/Aphasia - Normal; No aphasia + 0 10: Test Dysarthria - Normal + 0 11: Test Extinction/Inattention - No abnormality + 0  NIHSS Score: 3   Patient/Family was informed the Neurology Consult would occur via TeleHealth consult by way of interactive audio and video telecommunications and consented to receiving care in this manner.  Patient is being evaluated for possible acute neurologic impairment and high probability of imminent or life-threatening deterioration. I spent total of 40 minutes providing care to this patient, including time for face to face visit via telemedicine, review of medical records, imaging studies and discussion of findings with providers, the patient and/or family.   Dr Einar Crow   [facility] [phone]  Case [id number]

## 2020-03-28 NOTE — Progress Notes (Addendum)
While patient was originally transferred for concern for acute right lower extremity weakness, on further history obtained by the teleneurology consultant it appears that was found to be a chronic issue.  Furthermore the imaging they recommended (MRI brain with without contrast and MRI lumbar spine without contrast) revealed chronic findings without any acute process.  The patient appears to be having a primarily acute cardiac process at this point based on chart review.  Please reconsult neurology if there is a new acute inpatient neurological question that we can address.  Brooke Dare MD-PhD [facility] [phone]   Addendum for charge capture

## 2020-03-28 NOTE — Progress Notes (Signed)
Brief history: As per H&P done earlier today: "Scott Rios is a 64 y.o. male with medical history significant for hypertension and diabetes not on home medication, tobacco abuse and prior history of alcohol abuse (quit 10 years ago) who presents to the emergency department due to 17-month history of right leg weakness.  Patient states that the right leg sometimes go out on him.  He complained of difficulty in being able to stand after using the toilet last evening and he ended up crawling into the living room.  Right leg was numb and weak and with more difficulty in being able to raise the leg.  He complained of chronic constipation with last bowel movement pain 8 days ago, however there was no complaint of back pain, bowel or bladder changes.  Right hand was only significant for occasional tingling sensation.  He denies fever, chills, chest pain, shortness of breath or having any sick contact.  ED Course:  In the emergency department, he was hemodynamically stable.  Work-up in the ED showed mild leukocytosis, hyponatremia, hyperglycemia, magnesium 1.4, troponin x 2 27> 121, hypoalbuminemia.  SARS coronavirus was negative.  Initial chest x-ray showed borderline cardiomegaly with diffuse interstitial thickening suspicious for pulmonary edema, possible pulmonary nodule was also noted in the right midlung.  Patient suddenly became acutely hypoxic, tachypneic and gasping for breath when he returned from CT.  IV Lasix was given, IV fluids were stopped and patient was placed on BiPAP, repeat a chest x-ray was consistent with mild diffuse pulmonary interstitial edema.  CT of head showed no acute acute abnormality.  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  IV thiamine 500 mg x 1 was given.  Hospitalist was asked to admit patient for further evaluation and  management".  03/28/2020: Patient seen.  Available records reviewed.  Patient reported 1 month history of right lower extremity weakness that became severe yesterday.  According to the patient, he could not move the right lower extremity, could not feel any sensation, but now improving.  The right lower extremity is still weak, but patient is able to move the right lower extremity to some extent, and the sensation is back.  Elevated/rising troponin is noted.  Troponin has risen from 27-1 [id number].  No chest pain, but shortness of breath requiring BiPAP was documented at some point.  This could be angina equivalent i.e. flash pulmonary edema.  Shortness of breath has resolved.  Patient has continued to deny any chest pain.  Vitamin B12 is 122.  Sodium has risen from 126-129.  CT findings noted.  Awaiting MRI.  Telemetry neurology input is appreciated.  We will also consult PT OT.  Patient tells me that he quit alcohol use about 10 years ago.  No trauma, no falls.  Denies radiculopathic pain.  General condition: Patient is lying in any distress.  Patient is awake and alert.  Patient is a bit disheveled. HEENT: Patient has pallor.  No jaundice. Neck: No JVD.  Neck is supple. Lungs: Clear to auscultation.   CVS: S1-S2.  Abdomen: Soft and nontender.  Organs are not palpable. Neuro: Awake and alert.  Power 3+ to 4/5 left lower extremity, and 2+ to 3/5 right lower extremity.  Right lower extremity weakness. Hyponatremia. Elevated troponin. Vitamin B12 deficiency. History of alcohol abuse. Diabetes mellitus  Consult cardiology Consult PT OT. MRI brain and lumbosacral region Continue thiamine. Replete vitamin  B12. Follow folate level. Optimize blood sugar control. Neurology team is already been consulted.  Further management depend on hospital course.

## 2020-03-28 NOTE — ED Notes (Signed)
Pt had episode where he stated he could not breathe, o2 was 84% on room air. Placed pt on NRB and called EDP.

## 2020-03-28 NOTE — ED Notes (Signed)
CRITICAL VALUE ALERT  Critical Value:  Troponin 1747  Date & Time Notied:  03/28/20 1100  Provider Notified: dr.Ogbata  Orders Received/Actions taken: md notified

## 2020-03-28 NOTE — Progress Notes (Signed)
Patient was transferred to [facility] from [facility] for further evaluation recommendations given his neurological complaints and because he has an elevated Troponin now and went into flash pulmonary edema.  While he was in the [facility] ED after he came back from his CT scan he went to flash pulmonary edema and became extremely short of breath and had to be placed on BiPAP.  Echocardiogram was done and showed an EF of 35 to 40% with grade 2 diastolic dysfunction as well as severe hypokinesis of the left ventricular mid apical anteroseptal wall.  He is now on the medical floor on 6 E. and cardiology was consulted and Dr. Graciela Husbands will see the patient.  Currently he is getting his MRI and I agree with the prior physicians work-up and assessment.  Dr. Thomes Dinning admitted the patient early this morning after midnight and Dr. Dartha Lodge saw the patient had any pain prior to his transfer here.  Will await further cardiology and neurology recommendations and appreciate their help with work-up and evaluation.  Nursing has stated that he has not had a bowel movement in almost a week so we will add a bowel regimen for the patient.

## 2020-03-28 NOTE — ED Provider Notes (Signed)
[facility] EMERGENCY DEPARTMENT Provider Note   CSN: [id number] Arrival date & time: 03/27/20  2255     History Chief Complaint  Patient presents with  . Numbness    Scott Rios is a 64 y.o. male.  Patient from home with weakness, numbness and tingling in his bilateral legs right greater than left.  This is been ongoing for the past 2 weeks and progressively worsening.  Patient called EMS tonight because he had numbness and weakness in his right leg was not able to get off the toilet on his own.  He reports history of diabetes and hypertension but does not take any medications has not seen a doctor for many years.  He states he feels numbness and tingling more on the right side than the left.  There has been no fever or vomiting.  No bowel or bladder incontinence.  No history of IV drug abuse or cancer.  Patient states he has been dragging the right leg and feels progressively weak in it.  There is no saddle anesthesia.  Denies any alcohol use.  The history is provided by the patient and the EMS personnel.       Past Medical History:  Diagnosis Date  . Diabetes mellitus without complication (HCC)   . Hypertension     There are no problems to display for this patient.   Past Surgical History:  Procedure Laterality Date  . CHOLECYSTECTOMY    . HERNIA REPAIR         No family history on file.  Social History   Tobacco Use  . Smoking status: Current Every Day Smoker  . Smokeless tobacco: Never Used  Substance Use Topics  . Alcohol use: No  . Drug use: No    Home Medications Prior to Admission medications   Not on File    Allergies    Penicillins  Review of Systems   Review of Systems  Constitutional: Positive for fatigue. Negative for activity change, appetite change and fever.  HENT: Negative for congestion and rhinorrhea.   Respiratory: Negative for cough, chest tightness and shortness of breath.   Cardiovascular: Negative for chest pain.    Gastrointestinal: Negative for abdominal pain, nausea and vomiting.  Genitourinary: Negative for dysuria and hematuria.  Musculoskeletal: Positive for arthralgias and back pain.  Neurological: Positive for weakness and numbness. Negative for headaches.   all other systems are negative except as noted in the HPI and PMH.    Physical Exam Updated Vital Signs BP 134/69 (BP Location: Left Arm)   Pulse 97   Temp 97.8 F (36.6 C) (Oral)   Resp (!) 29   Ht 5\' 7"  (1.702 m)   Wt 77.1 kg   SpO2 94%   BMI 26.63 kg/m   Physical Exam Vitals and nursing note reviewed.  Constitutional:      General: He is not in acute distress.    Appearance: He is well-developed.     Comments: Disheveled  HENT:     Head: Normocephalic and atraumatic.     Mouth/Throat:     Pharynx: No oropharyngeal exudate.  Eyes:     Conjunctiva/sclera: Conjunctivae normal.     Pupils: Pupils are equal, round, and reactive to light.  Neck:     Comments: No meningismus. Cardiovascular:     Rate and Rhythm: Normal rate and regular rhythm.     Heart sounds: Normal heart sounds. No murmur heard.   Pulmonary:     Effort: Pulmonary effort is normal.  No respiratory distress.     Breath sounds: Normal breath sounds.  Abdominal:     Palpations: Abdomen is soft.     Tenderness: There is no abdominal tenderness. There is no guarding or rebound.  Musculoskeletal:        General: Tenderness present. Normal range of motion.     Cervical back: Normal range of motion and neck supple.     Comments: No midline L-spine or T-spine tenderness.  3/5 strength in right lower extremity compared to 5/5 strength in left lower extremity.  Subjective paresthesias bilaterally.  Unable to raise right leg off the bed.  Ankle flexion extension intact on the left.  This is weak on the right.  +2 DP and PT pulses.  Unable to elicit patellar reflexes bilaterally.  Skin:    General: Skin is warm.  Neurological:     Mental Status: He is alert  and oriented to person, place, and time.     Cranial Nerves: No cranial nerve deficit.     Motor: No abnormal muscle tone.     Coordination: Coordination normal.     Comments: Right lower extremity weakness as above.  Equal grip strength bilaterally.  No facial asymmetry.  Cranial nerves are intact.  No facial droop.  No pronator drift.  Psychiatric:        Behavior: Behavior normal.     ED Results / Procedures / Treatments   Labs (all labs ordered are listed, but only abnormal results are displayed) Labs Reviewed  CBC WITH DIFFERENTIAL/PLATELET - Abnormal; Notable for the following components:      Result Value   WBC 10.6 (*)    Hemoglobin 12.9 (*)    HCT 36.8 (*)    All other components within normal limits  COMPREHENSIVE METABOLIC PANEL - Abnormal; Notable for the following components:   Sodium 126 (*)    Chloride 93 (*)    Glucose, Bld 344 (*)    Calcium 8.5 (*)    Total Protein 5.7 (*)    Albumin 3.1 (*)    AST 12 (*)    All other components within normal limits  MAGNESIUM - Abnormal; Notable for the following components:   Magnesium 1.4 (*)    All other components within normal limits  TROPONIN I (HIGH SENSITIVITY) - Abnormal; Notable for the following components:   Troponin I (High Sensitivity) 27 (*)    All other components within normal limits  TROPONIN I (HIGH SENSITIVITY) - Abnormal; Notable for the following components:   Troponin I (High Sensitivity) 121 (*)    All other components within normal limits  SARS CORONAVIRUS 2 BY RT PCR (HOSPITAL ORDER, PERFORMED IN [facility])  LIPASE, BLOOD  VITAMIN B1  HIV ANTIBODY (ROUTINE TESTING W REFLEX)  COMPREHENSIVE METABOLIC PANEL  CBC  PROTIME-INR  APTT    EKG EKG Interpretation  Date/Time:  Friday March 27 2020 23:02:19 EDT Ventricular Rate:  97 PR Interval:    QRS Duration: 132 QT Interval:  386 QTC Calculation: 491 R Axis:   -62 Text Interpretation: Sinus rhythm Left bundle branch block  No significant change was found Confirmed by Glynn Octave [phone]) on 03/27/2020 11:21:44 PM   Radiology CT Head Wo Contrast  Result Date: 03/28/2020 CLINICAL DATA:  Neuro deficit, acute, stroke suspected Patient reports numbness and tingling of both legs for 2 weeks. EXAM: CT HEAD WITHOUT CONTRAST TECHNIQUE: Contiguous axial images were obtained from the base of the skull through the vertex without intravenous contrast.  COMPARISON:  Head CT 09/29/2012 FINDINGS: Brain: Generalized atrophy with slight progression from prior exam. There is mild chronic small vessel ischemia. No intracranial hemorrhage, mass effect, or midline shift. No hydrocephalus. The basilar cisterns are patent. No evidence of territorial infarct or acute ischemia. No extra-axial or intracranial fluid collection. Vascular: Atherosclerosis of skullbase vasculature without hyperdense vessel or abnormal calcification. Skull: No fracture or focal lesion. Sinuses/Orbits: Paranasal sinuses and mastoid air cells are clear. The visualized orbits are unremarkable. Other: None. IMPRESSION: 1. No acute intracranial abnormality. 2. Generalized atrophy and chronic small vessel ischemia. Electronically Signed   By: Narda Rutherford M.D.   On: 03/28/2020 03:27   CT Lumbar Spine Wo Contrast  Result Date: 03/28/2020 CLINICAL DATA:  Initial evaluation for acute low back pain, lower extremity numbness and tingling. EXAM: CT LUMBAR SPINE WITHOUT CONTRAST TECHNIQUE: Multidetector CT imaging of the lumbar spine was performed without intravenous contrast administration. Multiplanar CT image reconstructions were also generated. COMPARISON:  None available. FINDINGS: Segmentation: Standard. Alignment: Physiologic.  No listhesis or malalignment. Vertebrae: Chronic compression deformity involving the superior endplate of L4 with mild 30% height loss and trace 2 mm bony retropulsion. Vertebral body height otherwise maintained. No acute osseous abnormality.  Visualized sacrum and pelvis intact. SI joints approximated symmetric. No discrete or worrisome osseous lesions. Paraspinal and other soft tissues: Paraspinous soft tissues within normal limits. Moderate distension of the visualized urinary bladder. Enlarged prostate noted. Advanced aorto bi-iliac atherosclerotic disease. Visualized visceral structures otherwise unremarkable. Disc levels: L1-2: Negative interspace. Minimal facet hypertrophy. No stenosis or impingement. L2-3: Mild circumferential disc bulge. Mild bilateral facet hypertrophy. No significant spinal stenosis. Foramina remain patent. L3-4: Mild diffuse disc bulge with disc desiccation. Trace bony retropulsion related to the L4 compression fracture. There is a superimposed broad left foraminal to extraforaminal disc protrusion, closely approximating and potentially affecting the exiting left L3 nerve root (series 4, image 60). Mild facet and ligament flavum hypertrophy. Resultant moderate spinal stenosis. Mild left L3 foraminal narrowing. No significant right foraminal encroachment. L4-5: Diffuse disc bulge. Moderate facet and ligament flavum hypertrophy. Resultant severe canal and bilateral lateral recess stenosis. Mild to moderate bilateral L4 foraminal narrowing. L5-S1: Mild disc bulge. Mild to moderate facet hypertrophy. No significant spinal stenosis. Foramina remain patent. IMPRESSION: 1. No acute abnormality within the lumbar spine. 2. Multifactorial degenerative changes at L4-5 with resultant severe spinal stenosis. 3. Left foraminal to extraforaminal disc protrusion at L3-4, potentially affecting the exiting left L3 nerve root. 4. Chronic L4 compression fracture with mild 30% height loss and trace 2 mm bony retropulsion. 5. Aortic Atherosclerosis (ICD10-I70.0). Electronically Signed   By: Rise Mu M.D.   On: 03/28/2020 03:34   DG Chest Portable 1 View  Result Date: 03/28/2020 CLINICAL DATA:  Initial evaluation for acute onset  shortness of breath. EXAM: PORTABLE CHEST 1 VIEW COMPARISON:  Prior radiograph from 03/28/2020. FINDINGS: Cardiomegaly, stable. Mediastinal silhouette within normal limits. Aortic atherosclerosis. Lungs well inflated. Suspected underlying emphysematous changes. Superimposed interstitial prominence with a few scattered Kerley B lines, suggestive of a degree of superimposed interstitial edema. No consolidative airspace disease. No definite pleural effusion. No pneumothorax. 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 No acute osseous finding. IMPRESSION: 1. Cardiomegaly with mild diffuse pulmonary interstitial edema. 2. Suspected underlying emphysema. 3.  Aortic Atherosclerosis (ICD10-I70.0). Electronically Signed   By: Rise Mu M.D.   On: 03/28/2020 04:04   DG Chest Portable 1 View  Result Date: 03/28/2020 CLINICAL DATA:  Weakness.  Patient reports numbness and tingling of both legs. EXAM: PORTABLE CHEST 1 VIEW COMPARISON:  Radiograph 09/29/2012 FINDINGS: Borderline cardiomegaly. Mild aortic tortuosity. Diffuse interstitial thickening with suggestion of Kerley B-lines. Possible nodular density in the right mid lung, indeterminate. No evidence of pneumonia. No significant pleural effusion. No pneumothorax. Stable sclerotic density in the left proximal humerus likely an enchondroma. IMPRESSION: 1. Borderline cardiomegaly with diffuse interstitial thickening, suspicious for pulmonary edema. 2. Possible pulmonary nodule in the right mid lung. Consider chest CT on an elective basis for further evaluation. Electronically Signed   By: Narda Rutherford M.D.   On: 03/28/2020 00:41    Procedures .Critical Care Performed by: Glynn Octave, MD Authorized by: Glynn Octave, MD   Critical care provider statement:    Critical care time (minutes):  45   Critical care was necessary to treat or prevent imminent or life-threatening deterioration of the following  conditions:  Respiratory failure   Critical care was time spent personally by me on the following activities:  Discussions with consultants, evaluation of patient's response to treatment, examination of patient, ordering and performing treatments and interventions, ordering and review of laboratory studies, ordering and review of radiographic studies, pulse oximetry, re-evaluation of patient's condition, obtaining history from patient or surrogate and review of old charts   (including critical care time)  Medications Ordered in ED Medications - No data to display  ED Course  I have reviewed the triage vital signs and the nursing notes.  Pertinent labs & imaging results that were available during my care of the patient were reviewed by me and considered in my medical decision making (see chart for details).    MDM Rules/Calculators/A&P                         Greater than 2 weeks of progressively worsening right leg weakness numbness and tingling.  He does have motor deficits and sensory deficits on exam.  Intact distal pulses.  Patient found to have hyponatremia as well as AKI.  Hypomagnesemia.  Patient denies any alcohol use was a heavy drinker many years ago.  Neurology consult performed.  They recommend MRI of lumbar spine and brain.  This is not emergent as symptoms of been ongoing for several weeks.  Will also look with thiamine and treat for possible thiamine deficiency Warnicke's encephalopathy.  CT head is negative.  CT lumbar spine degenerative changes and spinal stenosis.  L3/L4 disc protrusion on the left does not explain his right leg weakness.  Chronic L4 compression fracture.  Recommendations:     .  IV Thiamine 500mg  three times a day for 2 consecutive days then 250mg  IV daily for 5 days (in combination with other B vitamins) then 100mg  daily PO.      Nonemergent MRI brain and L spine with and without contrast  Upon returning from CT scan, patient became acutely hypoxic,  tachypneic and gasping for breath.  IV fluids were stopped.  He was given IV Lasix and placed on BiPAP. Chest x-ray consistent with interstitional thickening and possible pulmonary edema.  Covid is negative.  Patient improved on BiPAP.  Work of breathing has normalized.  Covid is negative. Suspect pulmonary edema due to position change with CT scan and IV fluids which were stopped.  IV Lasix was given.  CT findings as above with concern for spinal stenosis in the lumbar spine as well as compression fracture.  Admission discussed with Dr. .  He requests notifying  [facility] neurology of the impending transfer.  Message sent to Dr. Otelia LimesLindzen. Dr. Thomes DinningAdefeso to make [facility]. Final Clinical Impression(s) / ED Diagnoses Final diagnoses:  Weakness of right lower extremity  Acute pulmonary edema (HCC)  Hyponatremia    Rx / DC Orders ED Discharge Orders    None       Ivory Bail, Jeannett SeniorStephen, MD 03/28/20 [phone]

## 2020-03-28 NOTE — Progress Notes (Signed)
Patient resting comfortably on room air. No respiratory distress noted. BIPAP not needed at this time. RT will monitor as needed. ?

## 2020-03-29 ENCOUNTER — Inpatient Hospital Stay (HOSPITAL_COMMUNITY): Payer: Medicare Other

## 2020-03-29 DIAGNOSIS — I5021 Acute systolic (congestive) heart failure: Secondary | ICD-10-CM | POA: Diagnosis not present

## 2020-03-29 DIAGNOSIS — R778 Other specified abnormalities of plasma proteins: Secondary | ICD-10-CM | POA: Diagnosis not present

## 2020-03-29 DIAGNOSIS — E119 Type 2 diabetes mellitus without complications: Secondary | ICD-10-CM

## 2020-03-29 DIAGNOSIS — I351 Nonrheumatic aortic (valve) insufficiency: Secondary | ICD-10-CM | POA: Diagnosis not present

## 2020-03-29 DIAGNOSIS — Z716 Tobacco abuse counseling: Secondary | ICD-10-CM

## 2020-03-29 DIAGNOSIS — F17200 Nicotine dependence, unspecified, uncomplicated: Secondary | ICD-10-CM

## 2020-03-29 DIAGNOSIS — M7989 Other specified soft tissue disorders: Secondary | ICD-10-CM

## 2020-03-29 DIAGNOSIS — J810 Acute pulmonary edema: Secondary | ICD-10-CM | POA: Diagnosis not present

## 2020-03-29 LAB — COMPREHENSIVE METABOLIC PANEL
ALT: 11 U/L (ref 0–44)
AST: 14 U/L — ABNORMAL LOW (ref 15–41)
Albumin: 2.9 g/dL — ABNORMAL LOW (ref 3.5–5.0)
Alkaline Phosphatase: 57 U/L (ref 38–126)
Anion gap: 8 (ref 5–15)
BUN: 7 mg/dL — ABNORMAL LOW (ref 8–23)
CO2: 26 mmol/L (ref 22–32)
Calcium: 8.9 mg/dL (ref 8.9–10.3)
Chloride: 96 mmol/L — ABNORMAL LOW (ref 98–111)
Creatinine, Ser: 0.57 mg/dL — ABNORMAL LOW (ref 0.61–1.24)
GFR calc Af Amer: >60 mL/min (ref >60)
GFR calc non Af Amer: >60 mL/min (ref >60)
Glucose, Bld: 221 mg/dL — ABNORMAL HIGH (ref 70–99)
Potassium: 4.2 mmol/L (ref 3.5–5.1)
Sodium: 130 mmol/L — ABNORMAL LOW (ref 135–145)
Total Bilirubin: 0.7 mg/dL (ref 0.3–1.2)
Total Protein: 5.5 g/dL — ABNORMAL LOW (ref 6.5–8.1)

## 2020-03-29 LAB — CBC WITH DIFFERENTIAL/PLATELET
Abs Immature Granulocytes: 0.06 10*3/uL (ref 0.00–0.07)
Basophils Absolute: 0.1 10*3/uL (ref 0.0–0.1)
Basophils Relative: 1 %
Eosinophils Absolute: 0.2 10*3/uL (ref 0.0–0.5)
Eosinophils Relative: 1 %
HCT: 36.9 % — ABNORMAL LOW (ref 39.0–52.0)
Hemoglobin: 12.9 g/dL — ABNORMAL LOW (ref 13.0–17.0)
Immature Granulocytes: 0 %
Lymphocytes Relative: 13 %
Lymphs Abs: 1.8 10*3/uL (ref 0.7–4.0)
MCH: 29.6 pg (ref 26.0–34.0)
MCHC: 35 g/dL (ref 30.0–36.0)
MCV: 84.6 fL (ref 80.0–100.0)
Monocytes Absolute: 0.9 10*3/uL (ref 0.1–1.0)
Monocytes Relative: 7 %
Neutro Abs: 10.7 10*3/uL — ABNORMAL HIGH (ref 1.7–7.7)
Neutrophils Relative %: 78 %
Platelets: 342 10*3/uL (ref 150–400)
RBC: 4.36 MIL/uL (ref 4.22–5.81)
RDW: 13.2 % (ref 11.5–15.5)
WBC: 13.7 10*3/uL — ABNORMAL HIGH (ref 4.0–10.5)
nRBC: 0 % (ref 0.0–0.2)

## 2020-03-29 LAB — GLUCOSE, CAPILLARY
Glucose-Capillary: 278 mg/dL — ABNORMAL HIGH (ref 70–99)
Glucose-Capillary: 165 mg/dL — ABNORMAL HIGH (ref 70–99)
Glucose-Capillary: 264 mg/dL — ABNORMAL HIGH (ref 70–99)

## 2020-03-29 LAB — MAGNESIUM: Magnesium: 1.6 mg/dL — ABNORMAL LOW (ref 1.7–2.4)

## 2020-03-29 LAB — TROPONIN I (HIGH SENSITIVITY): Troponin I (High Sensitivity): 629 ng/L (ref <18)

## 2020-03-29 LAB — PHOSPHORUS: Phosphorus: 3.6 mg/dL (ref 2.5–4.6)

## 2020-03-29 LAB — TSH: TSH: 2.465 u[IU]/mL (ref 0.350–4.500)

## 2020-03-29 MED ORDER — INSULIN ASPART 100 UNIT/ML ~~LOC~~ SOLN
0.0000 [IU] | Freq: Every day | SUBCUTANEOUS | Status: DC
  Administered 2020-03-29 – 2020-03-30 (×2): 3 [IU] via SUBCUTANEOUS
  Administered 2020-03-31: 4 [IU] via SUBCUTANEOUS
  Administered 2020-04-04: 3 [IU] via SUBCUTANEOUS
  Administered 2020-04-05: 2 [IU] via SUBCUTANEOUS
  Administered 2020-04-06: 4 [IU] via SUBCUTANEOUS
  Administered 2020-04-07: 2 [IU] via SUBCUTANEOUS

## 2020-03-29 MED ORDER — INSULIN ASPART 100 UNIT/ML ~~LOC~~ SOLN
0.0000 [IU] | Freq: Three times a day (TID) | SUBCUTANEOUS | Status: DC
  Administered 2020-03-29: 2 [IU] via SUBCUTANEOUS
  Administered 2020-03-29: 5 [IU] via SUBCUTANEOUS
  Administered 2020-03-30: 2 [IU] via SUBCUTANEOUS
  Administered 2020-03-31: 1 [IU] via SUBCUTANEOUS
  Administered 2020-03-31: 3 [IU] via SUBCUTANEOUS
  Administered 2020-04-01 (×2): 5 [IU] via SUBCUTANEOUS
  Administered 2020-04-01: 2 [IU] via SUBCUTANEOUS
  Administered 2020-04-02: 1 [IU] via SUBCUTANEOUS
  Administered 2020-04-02 (×2): 5 [IU] via SUBCUTANEOUS
  Administered 2020-04-03: 3 [IU] via SUBCUTANEOUS
  Administered 2020-04-03: 7 [IU] via SUBCUTANEOUS
  Administered 2020-04-04: 3 [IU] via SUBCUTANEOUS
  Administered 2020-04-04: 2 [IU] via SUBCUTANEOUS
  Administered 2020-04-05: 5 [IU] via SUBCUTANEOUS
  Administered 2020-04-05 – 2020-04-06 (×3): 2 [IU] via SUBCUTANEOUS
  Administered 2020-04-07: 3 [IU] via SUBCUTANEOUS
  Administered 2020-04-07 – 2020-04-08 (×2): 2 [IU] via SUBCUTANEOUS
  Administered 2020-04-08: 3 [IU] via SUBCUTANEOUS
  Administered 2020-04-08: 2 [IU] via SUBCUTANEOUS

## 2020-03-29 MED ORDER — IOHEXOL 350 MG/ML SOLN
75.0000 mL | Freq: Once | INTRAVENOUS | Status: AC | PRN
  Administered 2020-03-29: 75 mL via INTRAVENOUS

## 2020-03-29 MED ORDER — SODIUM CHLORIDE 0.9 % IV SOLN: INTRAVENOUS | Status: DC

## 2020-03-29 MED ORDER — METOPROLOL TARTRATE 12.5 MG HALF TABLET
12.5000 mg | ORAL_TABLET | Freq: Four times a day (QID) | ORAL | Status: DC
  Administered 2020-03-29 – 2020-03-30 (×4): 12.5 mg via ORAL
  Filled 2020-03-29 (×4): qty 1

## 2020-03-29 MED ORDER — ASPIRIN 81 MG PO CHEW
81.0000 mg | CHEWABLE_TABLET | ORAL | Status: AC
  Administered 2020-03-30: 81 mg via ORAL
  Filled 2020-03-29: qty 1

## 2020-03-29 MED ORDER — HEPARIN SODIUM (PORCINE) 5000 UNIT/ML IJ SOLN
5000.0000 [IU] | Freq: Three times a day (TID) | INTRAMUSCULAR | Status: DC
  Administered 2020-03-29 – 2020-04-09 (×30): 5000 [IU] via SUBCUTANEOUS
  Filled 2020-03-29 (×31): qty 1

## 2020-03-29 MED ORDER — ASPIRIN EC 81 MG PO TBEC
81.0000 mg | DELAYED_RELEASE_TABLET | Freq: Every day | ORAL | Status: DC
  Administered 2020-03-29 – 2020-04-08 (×10): 81 mg via ORAL
  Filled 2020-03-29 (×10): qty 1

## 2020-03-29 MED ORDER — ATORVASTATIN CALCIUM 80 MG PO TABS
80.0000 mg | ORAL_TABLET | Freq: Every day | ORAL | Status: DC
  Administered 2020-03-29 – 2020-04-18 (×20): 80 mg via ORAL
  Filled 2020-03-29 (×20): qty 1

## 2020-03-29 MED ORDER — MAGNESIUM SULFATE 2 GM/50ML IV SOLN
2.0000 g | Freq: Once | INTRAVENOUS | Status: AC
  Administered 2020-03-29: 2 g via INTRAVENOUS
  Filled 2020-03-29: qty 50

## 2020-03-29 NOTE — Progress Notes (Signed)
Left lower extremity venous duplex completed. Refer to "CV Proc" under chart review to view preliminary results.  03/29/2020 10:24 AM Eula Fried., MHA, RVT, RDCS, RDMS

## 2020-03-29 NOTE — H&P (View-Only) (Signed)
 Progress Note  Patient Name: Scott Rios Date of Encounter: 03/29/2020  [facility] Cardiologist: New--Dr. Turner (though lives in [place])  Subjective   He is feeling better today, can lie nearly flat, comfortable on room air. We discussed the results of the echo. Discussed his diabetes, tobacco, and risk factors today. See summary below. No chest pain.  Inpatient Medications    Scheduled Meds: . aspirin EC  81 mg Oral Daily  . atorvastatin  80 mg Oral Daily  . Chlorhexidine Gluconate Cloth  6 each Topical Daily  . cyanocobalamin  1,000 mcg Intramuscular Daily  . feeding supplement (GLUCERNA SHAKE)  237 mL Oral TID BM  . furosemide  40 mg Intravenous BID  . insulin aspart  0-5 Units Subcutaneous QHS  . insulin aspart  0-9 Units Subcutaneous TID WC  . losartan  25 mg Oral Daily  . metoprolol tartrate  12.5 mg Oral Q6H  . multivitamin with minerals  1 tablet Oral Daily  . nicotine  21 mg Transdermal Daily  . polyethylene glycol  17 g Oral BID  . senna-docusate  1 tablet Oral BID  . spironolactone  12.5 mg Oral Daily   Continuous Infusions: . [START ON 03/30/2020] thiamine injection    . thiamine injection 500 mg (03/29/20 0335)   PRN Meds: acetaminophen   Vital Signs    Vitals:   03/28/20 2341 03/29/20 0439 03/29/20 0750 03/29/20 1213  BP: 139/78 127/69 124/69 109/61  Pulse: 92 90 90 86  Resp: 18 16    Temp: 98 F (36.7 C) 98.1 F (36.7 C) 98.4 F (36.9 C) 98.4 F (36.9 C)  TempSrc: Oral Oral Oral Oral  SpO2: 95% 96% 97% 94%  Weight:  66.9 kg    Height:        Intake/Output Summary (Last 24 hours) at 03/29/2020 1234 Last data filed at 03/29/2020 0500 Gross per 24 hour  Intake 100 ml  Output 1925 ml  Net -1825 ml   Last 3 Weights 03/29/2020 03/28/2020 03/28/2020  Weight (lbs) 147 lb 8.9 oz 148 lb 2.4 oz 300 lb 11.3 oz  Weight (kg) 66.93 kg 67.2 kg 136.4 kg      Telemetry    NSR - Personally Reviewed  ECG    SR, LBBB - Personally  Reviewed  Physical Exam   GEN: No acute distress.   Neck: No JVD sitting upright Cardiac: RRR. 2/6 systolic ejection murmur. Diastolic murmur not well appreciated.  Respiratory: Clear in upper fields, mildly diminished at bases GI: Soft, nontender, non-distended  MS: Trivial bilateral LE edema; No deformity. Neuro:  Nonfocal  Psych: Normal affect   Labs    High Sensitivity Troponin:   Recent Labs  Lab 03/27/20 2317 03/28/20 0324 03/28/20 0819 03/28/20 1006 03/29/20 0941  TROPONINIHS 27* 121* 1,479* 1,747* 629*      Chemistry Recent Labs  Lab 03/27/20 2317 03/28/20 0324 03/29/20 0941  NA 126* 129* 130*  K 3.6 4.0 4.2  CL 93* 96* 96*  CO2 22 22 26  GLUCOSE 344* 286* 221*  BUN 8 9 7*  CREATININE 0.64 0.52* 0.57*  CALCIUM 8.5* 8.7* 8.9  PROT 5.7* 6.0* 5.5*  ALBUMIN 3.1* 3.1* 2.9*  AST 12* 12* 14*  ALT 9 10 11  ALKPHOS 59 62 57  BILITOT 0.3 0.4 0.7  GFRNONAA >60 >60 >60  GFRAA >60 >60 >60  ANIONGAP 11 11 8     Hematology Recent Labs  Lab 03/27/20 2317 03/28/20 0324 03/29/20 0941    WBC 10.6* 13.2* 13.7*  RBC 4.30 4.57 4.36  HGB 12.9* 13.5 12.9*  HCT 36.8* 39.5 36.9*  MCV 85.6 86.4 84.6  MCH 30.0 29.5 29.6  MCHC 35.1 34.2 35.0  RDW 13.2 13.3 13.2  PLT 319 350 342    BNPNo results for input(s): BNP, PROBNP in the last 168 hours.   DDimer  Recent Labs  Lab 03/28/20 2216  DDIMER 0.84*     Radiology    CT Head Wo Contrast  Result Date: 03/28/2020 CLINICAL DATA:  Neuro deficit, acute, stroke suspected Patient reports numbness and tingling of both legs for 2 weeks. EXAM: CT HEAD WITHOUT CONTRAST TECHNIQUE: Contiguous axial images were obtained from the base of the skull through the vertex without intravenous contrast. COMPARISON:  Head CT 09/29/2012 FINDINGS: Brain: Generalized atrophy with slight progression from prior exam. There is mild chronic small vessel ischemia. No intracranial hemorrhage, mass effect, or midline shift. No hydrocephalus. The  basilar cisterns are patent. No evidence of territorial infarct or acute ischemia. No extra-axial or intracranial fluid collection. Vascular: Atherosclerosis of skullbase vasculature without hyperdense vessel or abnormal calcification. Skull: No fracture or focal lesion. Sinuses/Orbits: Paranasal sinuses and mastoid air cells are clear. The visualized orbits are unremarkable. Other: None. IMPRESSION: 1. No acute intracranial abnormality. 2. Generalized atrophy and chronic small vessel ischemia. Electronically Signed   By: Melanie  Sanford M.D.   On: 03/28/2020 03:27   CT Lumbar Spine Wo Contrast  Result Date: 03/28/2020 CLINICAL DATA:  Initial evaluation for acute low back pain, lower extremity numbness and tingling. EXAM: CT LUMBAR SPINE WITHOUT CONTRAST TECHNIQUE: Multidetector CT imaging of the lumbar spine was performed without intravenous contrast administration. Multiplanar CT image reconstructions were also generated. COMPARISON:  None available. FINDINGS: Segmentation: Standard. Alignment: Physiologic.  No listhesis or malalignment. Vertebrae: Chronic compression deformity involving the superior endplate of L4 with mild 30% height loss and trace 2 mm bony retropulsion. Vertebral body height otherwise maintained. No acute osseous abnormality. Visualized sacrum and pelvis intact. SI joints approximated symmetric. No discrete or worrisome osseous lesions. Paraspinal and other soft tissues: Paraspinous soft tissues within normal limits. Moderate distension of the visualized urinary bladder. Enlarged prostate noted. Advanced aorto bi-iliac atherosclerotic disease. Visualized visceral structures otherwise unremarkable. Disc levels: L1-2: Negative interspace. Minimal facet hypertrophy. No stenosis or impingement. L2-3: Mild circumferential disc bulge. Mild bilateral facet hypertrophy. No significant spinal stenosis. Foramina remain patent. L3-4: Mild diffuse disc bulge with disc desiccation. Trace bony  retropulsion related to the L4 compression fracture. There is a superimposed broad left foraminal to extraforaminal disc protrusion, closely approximating and potentially affecting the exiting left L3 nerve root (series 4, image 60). Mild facet and ligament flavum hypertrophy. Resultant moderate spinal stenosis. Mild left L3 foraminal narrowing. No significant right foraminal encroachment. L4-5: Diffuse disc bulge. Moderate facet and ligament flavum hypertrophy. Resultant severe canal and bilateral lateral recess stenosis. Mild to moderate bilateral L4 foraminal narrowing. L5-S1: Mild disc bulge. Mild to moderate facet hypertrophy. No significant spinal stenosis. Foramina remain patent. IMPRESSION: 1. No acute abnormality within the lumbar spine. 2. Multifactorial degenerative changes at L4-5 with resultant severe spinal stenosis. 3. Left foraminal to extraforaminal disc protrusion at L3-4, potentially affecting the exiting left L3 nerve root. 4. Chronic L4 compression fracture with mild 30% height loss and trace 2 mm bony retropulsion. 5. Aortic Atherosclerosis (ICD10-I70.0). Electronically Signed   By: Benjamin  McClintock M.D.   On: 03/28/2020 03:34   MR Brain W and Wo Contrast  Result Date:   03/28/2020 CLINICAL DATA:  Neuro deficit EXAM: MRI HEAD WITHOUT AND WITH CONTRAST TECHNIQUE: Multiplanar, multiecho pulse sequences of the brain and surrounding structures were obtained without and with intravenous contrast. CONTRAST:  6.5mL GADAVIST GADOBUTROL 1 MMOL/ML IV SOLN COMPARISON:  03/28/2020 head CT and prior. FINDINGS: Please note that motion artifact limits evaluation. Brain: Scattered T2 hyperintense foci involving the periventricular/subcortical white matter and pons are nonspecific however commonly associated with chronic microvascular ischemic changes. No acute infarct or intracranial hemorrhage. No midline shift, ventriculomegaly or extra-axial fluid collection. No mass lesion. No abnormal enhancement.  Vascular: Normal flow voids. Skull and upper cervical spine: Normal marrow signal. Sinuses/Orbits: Normal orbits. Clear paranasal sinuses. Trace bilateral mastoid effusion. Other: None. IMPRESSION: No acute intracranial process. Mild chronic microvascular ischemic changes. Electronically Signed   By: Chikanele  Emekauwa M.D.   On: 03/28/2020 17:39   MR Lumbar Spine W Wo Contrast  Result Date: 03/28/2020 CLINICAL DATA:  Low back pain EXAM: MRI LUMBAR SPINE WITHOUT AND WITH CONTRAST TECHNIQUE: Multiplanar and multiecho pulse sequences of the lumbar spine were obtained without and with intravenous contrast. CONTRAST:  6.5mL GADAVIST GADOBUTROL 1 MMOL/ML IV SOLN COMPARISON:  03/28/2020 CT L-spine. FINDINGS: Please note that motion artifact limits evaluation. Segmentation:  Standard. Alignment:  Straightening of lordosis.  No listhesis. Vertebrae: Normal bone marrow signal intensity. 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 Chronic superior L4 endplate fracture deformity with approximately 30% height loss. No significant retropulsion. No acute fracture. Conus medullaris and cauda equina: Conus extends to the L1 level. Conus and cauda equina appear normal. Disc levels: Multilevel desiccation, endplate osteophytosis and mild disc space loss. 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 L1-2: Disc bulge with superimposed right foraminal protrusion and bilateral facet hypertrophy. Patent spinal canal and bilateral neural foramen. L2-3: Disc bulge abutting the bilateral descending L3 nerve roots and bilateral facet hypertrophy. Patent spinal canal. Mild left and moderate right neural foraminal narrowing. L3-4: Disc bulge abutting the exiting L3 and descending L4 nerve roots. Superimposed left foraminal/extraforaminal protrusion. Ligamentum flavum and bilateral facet hypertrophy. Mild spinal canal and bilateral neural foraminal narrowing. L4-5: Disc bulge with superimposed bilateral extraforaminal protrusions.  There is also a superimposed central protrusion. Ligamentum flavum and bilateral facet hypertrophy. Moderate to severe spinal canal narrowing. Moderate right and mild left neural foraminal narrowing. L5-S1: Disc bulge abutting the exiting bilateral L5 nerve roots with superimposed central protrusion. Bilateral facet hypertrophy. There is also abutment of the descending bilateral S1 nerve roots. Patent spinal canal and bilateral neural foramina. Paraspinal and other soft tissues: Paraspinal soft tissue within normal limits. 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 No acute osseous abnormality. Chronic L4 compression fracture deformity with mild height loss. Moderate to severe spinal canal narrowing at the L4-5 level. Moderate right L2-3, right L4-5 neural foraminal narrowing. L3-4 disc bulge abutment of the exiting L3, descending L4 nerve roots. L5-S1 disc bulge abutment of the exiting L5 and descending S1 nerve roots. Electronically Signed   By: Chikanele  Emekauwa M.D.   On: 03/28/2020 17:58   DG CHEST PORT 1 VIEW  Result Date: 03/29/2020 CLINICAL DATA:  Hypoxia EXAM: PORTABLE CHEST 1 VIEW COMPARISON:  03/28/2020 FINDINGS: Cardiac shadow is stable. The lungs are well aerated bilaterally. Skin fold is noted over the left base laterally. Diffuse interstitial edema is again identified and stable. No focal infiltrate is seen. IMPRESSION: Stable interstitial edema. Electronically Signed   By: Mark  Lukens M.D.   On: 03/29/2020 07:20   DG Chest Portable 1   View  Result Date: 03/28/2020 CLINICAL DATA:  Initial evaluation for acute onset shortness of breath. EXAM: PORTABLE CHEST 1 VIEW COMPARISON:  Prior radiograph from 03/28/2020. FINDINGS: Cardiomegaly, stable. Mediastinal silhouette within normal limits. Aortic atherosclerosis. Lungs well inflated. Suspected underlying emphysematous changes. Superimposed interstitial prominence with a few scattered Kerley B lines, suggestive of  a degree of superimposed interstitial edema. No consolidative airspace disease. No definite pleural effusion. No pneumothorax. 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 No acute osseous finding. IMPRESSION: 1. Cardiomegaly with mild diffuse pulmonary interstitial edema. 2. Suspected underlying emphysema. 3.  Aortic Atherosclerosis (ICD10-I70.0). Electronically Signed   By: Benjamin  McClintock M.D.   On: 03/28/2020 04:04   DG Chest Portable 1 View  Result Date: 03/28/2020 CLINICAL DATA:  Weakness. Patient reports numbness and tingling of both legs. EXAM: PORTABLE CHEST 1 VIEW COMPARISON:  Radiograph 09/29/2012 FINDINGS: Borderline cardiomegaly. Mild aortic tortuosity. Diffuse interstitial thickening with suggestion of Kerley B-lines. Possible nodular density in the right mid lung, indeterminate. No evidence of pneumonia. No significant pleural effusion. No pneumothorax. Stable sclerotic density in the left proximal humerus likely an enchondroma. IMPRESSION: 1. Borderline cardiomegaly with diffuse interstitial thickening, suspicious for pulmonary edema. 2. Possible pulmonary nodule in the right mid lung. Consider chest CT on an elective basis for further evaluation. Electronically Signed   By: Melanie  Sanford M.D.   On: 03/28/2020 00:41   ECHOCARDIOGRAM COMPLETE  Result Date: 03/28/2020    ECHOCARDIOGRAM REPORT   Patient Name:   Scott Rios Date of Exam: 03/28/2020 Medical Rec #:  [id number]      Height:       67.0 in Accession #:    [id number]     Weight:       170.0 lb Date of Birth:  11/07/1955      BSA:          1.887 m Patient Age:    64 years       BP:           128/68 mmHg Patient Gender: M              HR:           89 bpm. Exam Location:  Inpatient Procedure: 2D Echo STAT ECHO Indications:    elevated troponin  History:        Patient has no prior history of Echocardiogram examinations.                 Signs/Symptoms:elevated troponin; Risk Factors:Hypertension,                  Diabetes and Current Smoker.  Sonographer:    Lauren Pennington RDCS Referring Phys: [id number] OLADAPO ADEFESO IMPRESSIONS  1. Left ventricular ejection fraction, by estimation, is 35 to 40%. The left ventricle has moderately decreased function. The left ventricle demonstrates regional wall motion abnormalities (see scoring diagram/findings for description). The left ventricular internal cavity size was mildly dilated. There is mild concentric left ventricular hypertrophy. Left ventricular diastolic parameters are consistent with Grade II diastolic dysfunction (pseudonormalization). There is moderate hypokinesis of the left ventricular, mid-apical inferoseptal wall and inferior wall. There is severe hypokinesis of the left ventricular, mid-apical anteroseptal wall.  2. Right ventricular systolic function is normal. The right ventricular size is normal.  3. Left atrial size was severely dilated.  4. Right atrial size was mildly dilated.  5. Likely moderate eccentric MR.. The mitral valve is abnormal. Moderate mitral valve regurgitation.    6. Fixed right coronary cusp with prominent focal calcification. Suspect bicuspid valve, but obscured by significant calcification. Severe aortic regurgitation, best seen on apical images, with mitral valve anterior leaflet restriction secondary to jet.  Vena contracta 0.65 cm on 5 chamber apical, 0.80 cm on 3 chamber apical, fills most of LVOT and projects into majority of LV cavity (see image 83). Some diastolic flow reversal seen in descending aorta but not well visualized.. The aortic valve has an indeterminant number of cusps. Aortic valve regurgitation is severe. Mild aortic valve stenosis.  7. Aortic dilatation noted. There is borderline dilatation of the ascending aorta measuring 38 mm.  8. The inferior vena cava is dilated in size with <50% respiratory variability, suggesting right atrial pressure of 15 mmHg. Comparison(s): No prior Echocardiogram.  Conclusion(s)/Recommendation(s): Abnormal study. Reduced EF with both global and focal hypokinesis; mild global hypokinesis, with moderate wall motion abnormalities in the mid to apical inferoseptal and inferior wall, with severe hypokinesis in the mid to apical anteroseptal walls. Also with severe aortic regurgitation, calcified and likely bicuspid aortic valve with mild stenosis. FINDINGS  Left Ventricle: Left ventricular ejection fraction, by estimation, is 35 to 40%. The left ventricle has moderately decreased function. The left ventricle demonstrates regional wall motion abnormalities. Moderate hypokinesis of the left ventricular, mid-apical inferoseptal wall and inferior wall. Severe hypokinesis of the left ventricular, mid-apical anteroseptal wall. The left ventricular internal cavity size was mildly dilated. There is mild concentric left ventricular hypertrophy. Left ventricular diastolic parameters are consistent with Grade II diastolic dysfunction (pseudonormalization).  LV Wall Scoring: The mid anteroseptal segment is akinetic. The mid and distal inferior wall, basal anteroseptal segment, mid inferoseptal segment, apical septal segment, and apex are hypokinetic. The entire anterior wall, entire lateral wall, basal inferior segment, and basal inferoseptal segment are normal. Right Ventricle: The right ventricular size is normal. No increase in right ventricular wall thickness. Right ventricular systolic function is normal. Left Atrium: Left atrial size was severely dilated. Right Atrium: Right atrial size was mildly dilated. Pericardium: There is no evidence of pericardial effusion. Mitral Valve: Likely moderate eccentric MR. The mitral valve is abnormal. Moderate mitral annular calcification. Moderate mitral valve regurgitation. Tricuspid Valve: The tricuspid valve is normal in structure. Tricuspid valve regurgitation is trivial. No evidence of tricuspid stenosis. Aortic Valve: Fixed right coronary cusp  with prominent focal calcification. Suspect bicuspid valve, but obscured by significant calcification. Severe aortic regurgitation, best seen on apical images, with mitral valve anterior leaflet restriction secondary to jet. Vena contracta 0.65 cm on 5 chamber apical, 0.80 cm on 3 chamber apical, fills most of LVOT and projects into majority of LV cavity (see image 83). Some diastolic flow reversal seen in descending aorta but not well visualized. The aortic valve has an indeterminant number of cusps. . There is moderate thickening and moderate calcification of the aortic valve. Aortic valve regurgitation is severe. Aortic regurgitation PHT measures 266 msec. Mild aortic stenosis is present. There is moderate thickening of the aortic valve. There is moderate calcification of the aortic valve. Aortic valve mean gradient measures 12.0 mmHg. Aortic valve peak gradient measures 22.3 mmHg. Aortic valve area, by VTI measures 1.76 cm. Pulmonic Valve: The pulmonic valve was not well visualized. Pulmonic valve regurgitation is not visualized. Aorta: Aortic dilatation noted. There is borderline dilatation of the ascending aorta measuring 38 mm. Venous: The inferior vena cava is dilated in size with less than 50% respiratory variability, suggesting right atrial pressure of 15 mmHg. IAS/Shunts: No atrial level   shunt detected by color flow Doppler.  LEFT VENTRICLE PLAX 2D LVIDd:         5.54 cm      Diastology LVIDs:         4.40 cm      LV e' lateral:   5.00 cm/s LV PW:         1.23 cm      LV E/e' lateral: 21.4 LV IVS:        1.18 cm LVOT diam:     2.10 cm LV SV:         70 LV SV Index:   37 LVOT Area:     3.46 cm  LV Volumes (MOD) LV vol d, MOD A2C: 117.0 ml LV vol d, MOD A4C: 106.0 ml LV vol s, MOD A2C: 75.0 ml LV vol s, MOD A4C: 83.4 ml LV SV MOD A2C:     42.0 ml LV SV MOD A4C:     106.0 ml LV SV MOD BP:      32.1 ml RIGHT VENTRICLE RV S prime:     13.20 cm/s TAPSE (M-mode): 2.7 cm LEFT ATRIUM             Index        RIGHT ATRIUM           Index LA diam:        4.30 cm 2.28 cm/m  RA Area:     14.80 cm LA Vol (A2C):   57.4 ml 30.41 ml/m RA Volume:   35.30 ml  18.70 ml/m LA Vol (A4C):   81.8 ml 43.34 ml/m LA Biplane Vol: 68.7 ml 36.40 ml/m  AORTIC VALVE AV Area (Vmax):    1.67 cm AV Area (Vmean):   1.74 cm AV Area (VTI):     1.76 cm AV Vmax:           236.00 cm/s AV Vmean:          158.500 cm/s AV VTI:            0.401 m AV Peak Grad:      22.3 mmHg AV Mean Grad:      12.0 mmHg LVOT Vmax:         113.50 cm/s LVOT Vmean:        79.550 cm/s LVOT VTI:          0.204 m LVOT/AV VTI ratio: 0.51 AI PHT:            266 msec  AORTA Ao Root diam: 3.60 cm Ao Asc diam:  3.80 cm MITRAL VALVE MV Area (PHT): 6.12 cm     SHUNTS MV Decel Time: 124 msec     Systemic VTI:  0.20 m MR Peak grad:   101.6 mmHg  Systemic Diam: 2.10 cm MR Mean grad:   63.0 mmHg MR Vmax:        504.00 cm/s MR Vmean:       376.0 cm/s MR PISA:        1.01 cm MR PISA Radius: 0.40 cm MV E velocity: 107.00 cm/s MV A velocity: 69.40 cm/s MV E/A ratio:  1.54 Tacha Manni MD Electronically signed by Clarity Ciszek MD Signature Date/Time: 03/28/2020/12:53:56 PM    Final    VAS US LOWER EXTREMITY VENOUS (DVT)  Result Date: 03/29/2020  Lower Venous DVTStudy Indications: Swelling.  Comparison Study: No prior study Performing Technologist: Michelle Simonetti MHA, RDMS, RVT, RDCS  Examination Guidelines: A complete evaluation includes B-mode imaging, spectral Doppler,   color Doppler, and power Doppler as needed of all accessible portions of each vessel. Bilateral testing is considered an integral part of a complete examination. Limited examinations for reoccurring indications may be performed as noted. The reflux portion of the exam is performed with the patient in reverse Trendelenburg.  +-----+---------------+---------+-----------+----------+--------------+ RIGHTCompressibilityPhasicitySpontaneityPropertiesThrombus Aging  +-----+---------------+---------+-----------+----------+--------------+ CFV  Full           No       Yes                                 +-----+---------------+---------+-----------+----------+--------------+   +---------+---------------+---------+-----------+----------+--------------+ LEFT     CompressibilityPhasicitySpontaneityPropertiesThrombus Aging +---------+---------------+---------+-----------+----------+--------------+ CFV      Full           No       Yes                                 +---------+---------------+---------+-----------+----------+--------------+ SFJ      Full                                                        +---------+---------------+---------+-----------+----------+--------------+ FV Prox  Full                                                        +---------+---------------+---------+-----------+----------+--------------+ FV Mid   Full                                                        +---------+---------------+---------+-----------+----------+--------------+ FV DistalFull                                                        +---------+---------------+---------+-----------+----------+--------------+ PFV      Full                                                        +---------+---------------+---------+-----------+----------+--------------+ POP      Full           No       Yes                                 +---------+---------------+---------+-----------+----------+--------------+ PTV      Full                                                        +---------+---------------+---------+-----------+----------+--------------+   PERO     Full                                                        +---------+---------------+---------+-----------+----------+--------------+     Summary: RIGHT: - No evidence of common femoral vein obstruction.  LEFT: - There is no evidence of deep vein thrombosis in the  lower extremity.  - No cystic structure found in the popliteal fossa. Lower extremity venous flow is pulsatile, suggestive of possibly elevated right heart pressure.  *See table(s) above for measurements and observations. Electronically signed by Charles Fields MD on 03/29/2020 at 12:28:51 PM.    Final     Cardiac Studies   2D echo 03/28/2020 1. Left ventricular ejection fraction, by estimation, is 35 to 40%. The  left ventricle has moderately decreased function. The left ventricle  demonstrates regional wall motion abnormalities (see scoring  diagram/findings for description). The left  ventricular internal cavity size was mildly dilated. There is mild  concentric left ventricular hypertrophy. Left ventricular diastolic  parameters are consistent with Grade II diastolic dysfunction  (pseudonormalization). There is moderate hypokinesis of  the left ventricular, mid-apical inferoseptal wall and inferior wall.  There is severe hypokinesis of the left ventricular, mid-apical  anteroseptal wall.  2. Right ventricular systolic function is normal. The right ventricular  size is normal.  3. Left atrial size was severely dilated.  4. Right atrial size was mildly dilated.  5. Likely moderate eccentric MR.. The mitral valve is abnormal. Moderate  mitral valve regurgitation.  6. Fixed right coronary cusp with prominent focal calcification. Suspect  bicuspid valve, but obscured by significant calcification. Severe aortic  regurgitation, best seen on apical images, with mitral valve anterior  leaflet restriction secondary to jet.  Vena contracta 0.65 cm on 5 chamber apical, 0.80 cm on 3 chamber apical,  fills most of LVOT and projects into majority of LV cavity (see image 83).  Some diastolic flow reversal seen in descending aorta but not well  visualized.. The aortic valve has an  indeterminant number of cusps. Aortic valve regurgitation is severe. Mild  aortic valve stenosis.  7. Aortic  dilatation noted. There is borderline dilatation of the  ascending aorta measuring 38 mm.  8. The inferior vena cava is dilated in size with <50% respiratory  variability, suggesting right atrial pressure of 15 mmHg.   Patient Profile     64 y.o. male with a hx of DM and HTN, ETOH abuse and tobacco abuse who is being seen in consultation for the evaluation of acute pulmonary edema, LV dysfunction, moderate MR and severe AR at the request of Omair Sheikh, MD.  Assessment & Plan    Acute Respiratory Failure with flash pulmonary edema due to Acute systolic CHF -this occurred after IV contrast, but with cardiomyopathy and valve abnormalities, suspect there is a cardiac cause -will need further workup with right and left heart cath, see below -started on Losartan 25mg daily and spiro 12.5mg daily -diuresing on Lasix 40mg IV BID, net negative 1.8 L overnight -follow strict I&O's and daily weights -starting low dose metoprolol q6 as he is warm on exam. Will need to consolidate to succinate prior to discharge.  Elevated troponin -he has not had any anginal sx but trend in hsTrop c/w ACS ([id number]). May also be demand in the setting   of pulmonary edema, but given abnormal echo, recommend cardiac cath for further evaluation. However, would exclude aortic dissection first--given severe AI and mildly enlarged root on echo. Cannot tell based on the imaging whether there is a dissection, but would not want to do cath if dissection is present. -troponin now downtrending, no further draws needed until he develops worsening symptoms -ordered gated CT angio from aortic valve to descending thoracic aorta -Risks and benefits of cardiac catheterization have been discussed with the patient.  These include bleeding, infection, kidney damage, stroke, heart attack, death.  The patient understands these risks and is willing to proceed. -plan for R/LHC tomorrow -started aspirin, statin, beta blocker  today  Cardiomyopathy -EF 35-40 with wall motion abnormalities/severe LAE -cath as above to assist with etiology  AR-severe -echo with possible bicuspid AV with associated mild AS.   -RCC is fixed with prominent focal calcification with associated severe AI -will need TEE for further evaluation, unless clear etiology can be seen on CT scan. Likely will need TEE anyway to assess MR.  Moderate MR -there is anterior MV leaflet restriction due to severe AI jet -likely exacerbated by cardiomyopathy  Tobacco abuse -ongoing -The patient was counseled on tobacco cessation today fo r4 minutes.  Counseling included reviewing the risks of smoking tobacco products, how it impacts the patient's current medical diagnoses and different strategies for quitting.  Pharmacotherapy to aid in tobacco cessation was not prescribed today. He is going to quit.  Type II diabetes -HbA1C 10.2%, on no meds at home -adding SSI/accuchecks -starting statin today -on ARB, starting this admission -consider SGLT2i given reduced EF prior to discharge  Hyponatremia -improving with diuresis, supports hypervolemic hyponatremia -fluid restrict to 1.5L  Per primary/other consulting teams: Left leg weakness Leukocytosis with left shift Elevated d-dimer (U/S lower extremity pending)  For questions or updates, please contact [facility] Please consult [email or web address] for contact info under        Signed, Nalla Purdy, MD  03/29/2020, 12:34 PM   

## 2020-03-29 NOTE — Progress Notes (Signed)
Progress Note  Patient Name: Scott Rios Date of Encounter: 03/29/2020  [facility] Cardiologist: New--Dr. Mayford Knife (though lives in [place])  Subjective   He is feeling better today, can lie nearly flat, comfortable on room air. We discussed the results of the echo. Discussed his diabetes, tobacco, and risk factors today. See summary below. No chest pain.  Inpatient Medications    Scheduled Meds: . aspirin EC  81 mg Oral Daily  . atorvastatin  80 mg Oral Daily  . Chlorhexidine Gluconate Cloth  6 each Topical Daily  . cyanocobalamin  1,000 mcg Intramuscular Daily  . feeding supplement (GLUCERNA SHAKE)  237 mL Oral TID BM  . furosemide  40 mg Intravenous BID  . insulin aspart  0-5 Units Subcutaneous QHS  . insulin aspart  0-9 Units Subcutaneous TID WC  . losartan  25 mg Oral Daily  . metoprolol tartrate  12.5 mg Oral Q6H  . multivitamin with minerals  1 tablet Oral Daily  . nicotine  21 mg Transdermal Daily  . polyethylene glycol  17 g Oral BID  . senna-docusate  1 tablet Oral BID  . spironolactone  12.5 mg Oral Daily   Continuous Infusions: . [START ON 03/30/2020] thiamine injection    . thiamine injection 500 mg (03/29/20 0335)   PRN Meds: acetaminophen   Vital Signs    Vitals:   03/28/20 2341 03/29/20 0439 03/29/20 0750 03/29/20 1213  BP: 139/78 127/69 124/69 109/61  Pulse: 92 90 90 86  Resp: 18 16    Temp: 98 F (36.7 C) 98.1 F (36.7 C) 98.4 F (36.9 C) 98.4 F (36.9 C)  TempSrc: Oral Oral Oral Oral  SpO2: 95% 96% 97% 94%  Weight:  66.9 kg    Height:        Intake/Output Summary (Last 24 hours) at 03/29/2020 1234 Last data filed at 03/29/2020 0500 Gross per 24 hour  Intake 100 ml  Output 1925 ml  Net -1825 ml   Last 3 Weights 03/29/2020 03/28/2020 03/28/2020  Weight (lbs) 147 lb 8.9 oz 148 lb 2.4 oz 300 lb 11.3 oz  Weight (kg) 66.93 kg 67.2 kg 136.4 kg      Telemetry    NSR - Personally Reviewed  ECG    SR, LBBB - Personally  Reviewed  Physical Exam   GEN: No acute distress.   Neck: No JVD sitting upright Cardiac: RRR. 2/6 systolic ejection murmur. Diastolic murmur not well appreciated.  Respiratory: Clear in upper fields, mildly diminished at bases GI: Soft, nontender, non-distended  MS: Trivial bilateral LE edema; No deformity. Neuro:  Nonfocal  Psych: Normal affect   Labs    High Sensitivity Troponin:   Recent Labs  Lab 03/27/20 2317 03/28/20 0324 03/28/20 0819 03/28/20 1006 03/29/20 0941  TROPONINIHS 27* 121* 1,479* 1,747* 629*      Chemistry Recent Labs  Lab 03/27/20 2317 03/28/20 0324 03/29/20 0941  NA 126* 129* 130*  K 3.6 4.0 4.2  CL 93* 96* 96*  CO2 22 22 26   GLUCOSE 344* 286* 221*  BUN 8 9 7*  CREATININE 0.64 0.52* 0.57*  CALCIUM 8.5* 8.7* 8.9  PROT 5.7* 6.0* 5.5*  ALBUMIN 3.1* 3.1* 2.9*  AST 12* 12* 14*  ALT 9 10 11   ALKPHOS 59 62 57  BILITOT 0.3 0.4 0.7  GFRNONAA >60 >60 >60  GFRAA >60 >60 >60  ANIONGAP 11 11 8      Hematology Recent Labs  Lab 03/27/20 2317 03/28/20 0324 03/29/20 0941  WBC 10.6* 13.2* 13.7*  RBC 4.30 4.57 4.36  HGB 12.9* 13.5 12.9*  HCT 36.8* 39.5 36.9*  MCV 85.6 86.4 84.6  MCH 30.0 29.5 29.6  MCHC 35.1 34.2 35.0  RDW 13.2 13.3 13.2  PLT 319 350 342    BNPNo results for input(s): BNP, PROBNP in the last 168 hours.   DDimer  Recent Labs  Lab 03/28/20 2216  DDIMER 0.84*     Radiology    CT Head Wo Contrast  Result Date: 03/28/2020 CLINICAL DATA:  Neuro deficit, acute, stroke suspected Patient reports numbness and tingling of both legs for 2 weeks. EXAM: CT HEAD WITHOUT CONTRAST TECHNIQUE: Contiguous axial images were obtained from the base of the skull through the vertex without intravenous contrast. COMPARISON:  Head CT 09/29/2012 FINDINGS: Brain: Generalized atrophy with slight progression from prior exam. There is mild chronic small vessel ischemia. No intracranial hemorrhage, mass effect, or midline shift. No hydrocephalus. The  basilar cisterns are patent. No evidence of territorial infarct or acute ischemia. No extra-axial or intracranial fluid collection. Vascular: Atherosclerosis of skullbase vasculature without hyperdense vessel or abnormal calcification. Skull: No fracture or focal lesion. Sinuses/Orbits: Paranasal sinuses and mastoid air cells are clear. The visualized orbits are unremarkable. Other: None. IMPRESSION: 1. No acute intracranial abnormality. 2. Generalized atrophy and chronic small vessel ischemia. Electronically Signed   By: Narda Rutherford M.D.   On: 03/28/2020 03:27   CT Lumbar Spine Wo Contrast  Result Date: 03/28/2020 CLINICAL DATA:  Initial evaluation for acute low back pain, lower extremity numbness and tingling. EXAM: CT LUMBAR SPINE WITHOUT CONTRAST TECHNIQUE: Multidetector CT imaging of the lumbar spine was performed without intravenous contrast administration. Multiplanar CT image reconstructions were also generated. COMPARISON:  None available. FINDINGS: Segmentation: Standard. Alignment: Physiologic.  No listhesis or malalignment. Vertebrae: Chronic compression deformity involving the superior endplate of L4 with mild 30% height loss and trace 2 mm bony retropulsion. Vertebral body height otherwise maintained. No acute osseous abnormality. Visualized sacrum and pelvis intact. SI joints approximated symmetric. No discrete or worrisome osseous lesions. Paraspinal and other soft tissues: Paraspinous soft tissues within normal limits. Moderate distension of the visualized urinary bladder. Enlarged prostate noted. Advanced aorto bi-iliac atherosclerotic disease. Visualized visceral structures otherwise unremarkable. Disc levels: L1-2: Negative interspace. Minimal facet hypertrophy. No stenosis or impingement. L2-3: Mild circumferential disc bulge. Mild bilateral facet hypertrophy. No significant spinal stenosis. Foramina remain patent. L3-4: Mild diffuse disc bulge with disc desiccation. Trace bony  retropulsion related to the L4 compression fracture. There is a superimposed broad left foraminal to extraforaminal disc protrusion, closely approximating and potentially affecting the exiting left L3 nerve root (series 4, image 60). Mild facet and ligament flavum hypertrophy. Resultant moderate spinal stenosis. Mild left L3 foraminal narrowing. No significant right foraminal encroachment. L4-5: Diffuse disc bulge. Moderate facet and ligament flavum hypertrophy. Resultant severe canal and bilateral lateral recess stenosis. Mild to moderate bilateral L4 foraminal narrowing. L5-S1: Mild disc bulge. Mild to moderate facet hypertrophy. No significant spinal stenosis. Foramina remain patent. IMPRESSION: 1. No acute abnormality within the lumbar spine. 2. Multifactorial degenerative changes at L4-5 with resultant severe spinal stenosis. 3. Left foraminal to extraforaminal disc protrusion at L3-4, potentially affecting the exiting left L3 nerve root. 4. Chronic L4 compression fracture with mild 30% height loss and trace 2 mm bony retropulsion. 5. Aortic Atherosclerosis (ICD10-I70.0). Electronically Signed   By: Rise Mu M.D.   On: 03/28/2020 03:34   MR Brain W and Wo Contrast  Result Date:  03/28/2020 CLINICAL DATA:  Neuro deficit EXAM: MRI HEAD WITHOUT AND WITH CONTRAST TECHNIQUE: Multiplanar, multiecho pulse sequences of the brain and surrounding structures were obtained without and with intravenous contrast. CONTRAST:  6.52mL GADAVIST GADOBUTROL 1 MMOL/ML IV SOLN COMPARISON:  03/28/2020 head CT and prior. FINDINGS: Please note that motion artifact limits evaluation. Brain: Scattered T2 hyperintense foci involving the periventricular/subcortical white matter and pons are nonspecific however commonly associated with chronic microvascular ischemic changes. No acute infarct or intracranial hemorrhage. No midline shift, ventriculomegaly or extra-axial fluid collection. No mass lesion. No abnormal enhancement.  Vascular: Normal flow voids. Skull and upper cervical spine: Normal marrow signal. Sinuses/Orbits: Normal orbits. Clear paranasal sinuses. Trace bilateral mastoid effusion. Other: None. IMPRESSION: No acute intracranial process. Mild chronic microvascular ischemic changes. Electronically Signed   By: Stana Bunting M.D.   On: 03/28/2020 17:39   MR Lumbar Spine W Wo Contrast  Result Date: 03/28/2020 CLINICAL DATA:  Low back pain EXAM: MRI LUMBAR SPINE WITHOUT AND WITH CONTRAST TECHNIQUE: Multiplanar and multiecho pulse sequences of the lumbar spine were obtained without and with intravenous contrast. CONTRAST:  6.76mL GADAVIST GADOBUTROL 1 MMOL/ML IV SOLN COMPARISON:  03/28/2020 CT L-spine. FINDINGS: Please note that motion artifact limits evaluation. Segmentation:  Standard. Alignment:  Straightening of lordosis.  No listhesis. Vertebrae: Normal bone marrow signal intensity. 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 Chronic superior L4 endplate fracture deformity with approximately 30% height loss. No significant retropulsion. No acute fracture. Conus medullaris and cauda equina: Conus extends to the L1 level. Conus and cauda equina appear normal. Disc levels: Multilevel desiccation, endplate osteophytosis and mild disc space loss. 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 L1-2: Disc bulge with superimposed right foraminal protrusion and bilateral facet hypertrophy. Patent spinal canal and bilateral neural foramen. L2-3: Disc bulge abutting the bilateral descending L3 nerve roots and bilateral facet hypertrophy. Patent spinal canal. Mild left and moderate right neural foraminal narrowing. L3-4: Disc bulge abutting the exiting L3 and descending L4 nerve roots. Superimposed left foraminal/extraforaminal protrusion. Ligamentum flavum and bilateral facet hypertrophy. Mild spinal canal and bilateral neural foraminal narrowing. L4-5: Disc bulge with superimposed bilateral extraforaminal protrusions.  There is also a superimposed central protrusion. Ligamentum flavum and bilateral facet hypertrophy. Moderate to severe spinal canal narrowing. Moderate right and mild left neural foraminal narrowing. L5-S1: Disc bulge abutting the exiting bilateral L5 nerve roots with superimposed central protrusion. Bilateral facet hypertrophy. There is also abutment of the descending bilateral S1 nerve roots. Patent spinal canal and bilateral neural foramina. Paraspinal and other soft tissues: Paraspinal soft tissue within normal limits. 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 No acute osseous abnormality. Chronic L4 compression fracture deformity with mild height loss. Moderate to severe spinal canal narrowing at the L4-5 level. Moderate right L2-3, right L4-5 neural foraminal narrowing. L3-4 disc bulge abutment of the exiting L3, descending L4 nerve roots. L5-S1 disc bulge abutment of the exiting L5 and descending S1 nerve roots. Electronically Signed   By: Stana Bunting M.D.   On: 03/28/2020 17:58   DG CHEST PORT 1 VIEW  Result Date: 03/29/2020 CLINICAL DATA:  Hypoxia EXAM: PORTABLE CHEST 1 VIEW COMPARISON:  03/28/2020 FINDINGS: Cardiac shadow is stable. The lungs are well aerated bilaterally. Skin fold is noted over the left base laterally. Diffuse interstitial edema is again identified and stable. No focal infiltrate is seen. IMPRESSION: Stable interstitial edema. Electronically Signed   By: Alcide Clever M.D.   On: 03/29/2020 07:20   DG Chest Portable 1  View  Result Date: 03/28/2020 CLINICAL DATA:  Initial evaluation for acute onset shortness of breath. EXAM: PORTABLE CHEST 1 VIEW COMPARISON:  Prior radiograph from 03/28/2020. FINDINGS: Cardiomegaly, stable. Mediastinal silhouette within normal limits. Aortic atherosclerosis. Lungs well inflated. Suspected underlying emphysematous changes. Superimposed interstitial prominence with a few scattered Kerley B lines, suggestive of  a degree of superimposed interstitial edema. No consolidative airspace disease. No definite pleural effusion. No pneumothorax. 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 No acute osseous finding. IMPRESSION: 1. Cardiomegaly with mild diffuse pulmonary interstitial edema. 2. Suspected underlying emphysema. 3.  Aortic Atherosclerosis (ICD10-I70.0). Electronically Signed   By: Rise Mu M.D.   On: 03/28/2020 04:04   DG Chest Portable 1 View  Result Date: 03/28/2020 CLINICAL DATA:  Weakness. Patient reports numbness and tingling of both legs. EXAM: PORTABLE CHEST 1 VIEW COMPARISON:  Radiograph 09/29/2012 FINDINGS: Borderline cardiomegaly. Mild aortic tortuosity. Diffuse interstitial thickening with suggestion of Kerley B-lines. Possible nodular density in the right mid lung, indeterminate. No evidence of pneumonia. No significant pleural effusion. No pneumothorax. Stable sclerotic density in the left proximal humerus likely an enchondroma. IMPRESSION: 1. Borderline cardiomegaly with diffuse interstitial thickening, suspicious for pulmonary edema. 2. Possible pulmonary nodule in the right mid lung. Consider chest CT on an elective basis for further evaluation. Electronically Signed   By: Narda Rutherford M.D.   On: 03/28/2020 00:41   ECHOCARDIOGRAM COMPLETE  Result Date: 03/28/2020    ECHOCARDIOGRAM REPORT   Patient Name:   Scott Rios Date of Exam: 03/28/2020 Medical Rec #:  [id number]      Height:       67.0 in Accession #:    [id number]     Weight:       170.0 lb Date of Birth:  12-Sep-1955      BSA:          1.887 m Patient Age:    64 years       BP:           128/68 mmHg Patient Gender: M              HR:           89 bpm. Exam Location:  Inpatient Procedure: 2D Echo STAT ECHO Indications:    elevated troponin  History:        Patient has no prior history of Echocardiogram examinations.                 Signs/Symptoms:elevated troponin; Risk Factors:Hypertension,                  Diabetes and Current Smoker.  Sonographer:    Delcie Roch RDCS Referring Phys: [id number] OLADAPO ADEFESO IMPRESSIONS  1. Left ventricular ejection fraction, by estimation, is 35 to 40%. The left ventricle has moderately decreased function. The left ventricle demonstrates regional wall motion abnormalities (see scoring diagram/findings for description). The left ventricular internal cavity size was mildly dilated. There is mild concentric left ventricular hypertrophy. Left ventricular diastolic parameters are consistent with Grade II diastolic dysfunction (pseudonormalization). There is moderate hypokinesis of the left ventricular, mid-apical inferoseptal wall and inferior wall. There is severe hypokinesis of the left ventricular, mid-apical anteroseptal wall.  2. Right ventricular systolic function is normal. The right ventricular size is normal.  3. Left atrial size was severely dilated.  4. Right atrial size was mildly dilated.  5. Likely moderate eccentric MR.. The mitral valve is abnormal. Moderate mitral valve regurgitation.  6. Fixed right coronary cusp with prominent focal calcification. Suspect bicuspid valve, but obscured by significant calcification. Severe aortic regurgitation, best seen on apical images, with mitral valve anterior leaflet restriction secondary to jet.  Vena contracta 0.65 cm on 5 chamber apical, 0.80 cm on 3 chamber apical, fills most of LVOT and projects into majority of LV cavity (see image 83). Some diastolic flow reversal seen in descending aorta but not well visualized.. The aortic valve has an indeterminant number of cusps. Aortic valve regurgitation is severe. Mild aortic valve stenosis.  7. Aortic dilatation noted. There is borderline dilatation of the ascending aorta measuring 38 mm.  8. The inferior vena cava is dilated in size with <50% respiratory variability, suggesting right atrial pressure of 15 mmHg. Comparison(s): No prior Echocardiogram.  Conclusion(s)/Recommendation(s): Abnormal study. Reduced EF with both global and focal hypokinesis; mild global hypokinesis, with moderate wall motion abnormalities in the mid to apical inferoseptal and inferior wall, with severe hypokinesis in the mid to apical anteroseptal walls. Also with severe aortic regurgitation, calcified and likely bicuspid aortic valve with mild stenosis. FINDINGS  Left Ventricle: Left ventricular ejection fraction, by estimation, is 35 to 40%. The left ventricle has moderately decreased function. The left ventricle demonstrates regional wall motion abnormalities. Moderate hypokinesis of the left ventricular, mid-apical inferoseptal wall and inferior wall. Severe hypokinesis of the left ventricular, mid-apical anteroseptal wall. The left ventricular internal cavity size was mildly dilated. There is mild concentric left ventricular hypertrophy. Left ventricular diastolic parameters are consistent with Grade II diastolic dysfunction (pseudonormalization).  LV Wall Scoring: The mid anteroseptal segment is akinetic. The mid and distal inferior wall, basal anteroseptal segment, mid inferoseptal segment, apical septal segment, and apex are hypokinetic. The entire anterior wall, entire lateral wall, basal inferior segment, and basal inferoseptal segment are normal. Right Ventricle: The right ventricular size is normal. No increase in right ventricular wall thickness. Right ventricular systolic function is normal. Left Atrium: Left atrial size was severely dilated. Right Atrium: Right atrial size was mildly dilated. Pericardium: There is no evidence of pericardial effusion. Mitral Valve: Likely moderate eccentric MR. The mitral valve is abnormal. Moderate mitral annular calcification. Moderate mitral valve regurgitation. Tricuspid Valve: The tricuspid valve is normal in structure. Tricuspid valve regurgitation is trivial. No evidence of tricuspid stenosis. Aortic Valve: Fixed right coronary cusp  with prominent focal calcification. Suspect bicuspid valve, but obscured by significant calcification. Severe aortic regurgitation, best seen on apical images, with mitral valve anterior leaflet restriction secondary to jet. Vena contracta 0.65 cm on 5 chamber apical, 0.80 cm on 3 chamber apical, fills most of LVOT and projects into majority of LV cavity (see image 83). Some diastolic flow reversal seen in descending aorta but not well visualized. The aortic valve has an indeterminant number of cusps. . There is moderate thickening and moderate calcification of the aortic valve. Aortic valve regurgitation is severe. Aortic regurgitation PHT measures 266 msec. Mild aortic stenosis is present. There is moderate thickening of the aortic valve. There is moderate calcification of the aortic valve. Aortic valve mean gradient measures 12.0 mmHg. Aortic valve peak gradient measures 22.3 mmHg. Aortic valve area, by VTI measures 1.76 cm. Pulmonic Valve: The pulmonic valve was not well visualized. Pulmonic valve regurgitation is not visualized. Aorta: Aortic dilatation noted. There is borderline dilatation of the ascending aorta measuring 38 mm. Venous: The inferior vena cava is dilated in size with less than 50% respiratory variability, suggesting right atrial pressure of 15 mmHg. IAS/Shunts: No atrial level  shunt detected by color flow Doppler.  LEFT VENTRICLE PLAX 2D LVIDd:         5.54 cm      Diastology LVIDs:         4.40 cm      LV e' lateral:   5.00 cm/s LV PW:         1.23 cm      LV E/e' lateral: 21.4 LV IVS:        1.18 cm LVOT diam:     2.10 cm LV SV:         70 LV SV Index:   37 LVOT Area:     3.46 cm  LV Volumes (MOD) LV vol d, MOD A2C: 117.0 ml LV vol d, MOD A4C: 106.0 ml LV vol s, MOD A2C: 75.0 ml LV vol s, MOD A4C: 83.4 ml LV SV MOD A2C:     42.0 ml LV SV MOD A4C:     106.0 ml LV SV MOD BP:      32.1 ml RIGHT VENTRICLE RV S prime:     13.20 cm/s TAPSE (M-mode): 2.7 cm LEFT ATRIUM             Index        RIGHT ATRIUM           Index LA diam:        4.30 cm 2.28 cm/m  RA Area:     14.80 cm LA Vol (A2C):   57.4 ml 30.41 ml/m RA Volume:   35.30 ml  18.70 ml/m LA Vol (A4C):   81.8 ml 43.34 ml/m LA Biplane Vol: 68.7 ml 36.40 ml/m  AORTIC VALVE AV Area (Vmax):    1.67 cm AV Area (Vmean):   1.74 cm AV Area (VTI):     1.76 cm AV Vmax:           236.00 cm/s AV Vmean:          158.500 cm/s AV VTI:            0.401 m AV Peak Grad:      22.3 mmHg AV Mean Grad:      12.0 mmHg LVOT Vmax:         113.50 cm/s LVOT Vmean:        79.550 cm/s LVOT VTI:          0.204 m LVOT/AV VTI ratio: 0.51 AI PHT:            266 msec  AORTA Ao Root diam: 3.60 cm Ao Asc diam:  3.80 cm MITRAL VALVE MV Area (PHT): 6.12 cm     SHUNTS MV Decel Time: 124 msec     Systemic VTI:  0.20 m MR Peak grad:   101.6 mmHg  Systemic Diam: 2.10 cm MR Mean grad:   63.0 mmHg MR Vmax:        504.00 cm/s MR Vmean:       376.0 cm/s MR PISA:        1.01 cm MR PISA Radius: 0.40 cm MV E velocity: 107.00 cm/s MV A velocity: 69.40 cm/s MV E/A ratio:  1.54 Jodelle Red MD Electronically signed by Jodelle Red MD Signature Date/Time: 03/28/2020/12:53:56 PM    Final    VAS [place] LOWER EXTREMITY VENOUS (DVT)  Result Date: 03/29/2020  Lower Venous DVTStudy Indications: Swelling.  Comparison Study: No prior study Performing Technologist: Gertie Fey MHA, RDMS, RVT, RDCS  Examination Guidelines: A complete evaluation includes B-mode imaging, spectral Doppler,  color Doppler, and power Doppler as needed of all accessible portions of each vessel. Bilateral testing is considered an integral part of a complete examination. Limited examinations for reoccurring indications may be performed as noted. The reflux portion of the exam is performed with the patient in reverse Trendelenburg.  +-----+---------------+---------+-----------+----------+--------------+ RIGHTCompressibilityPhasicitySpontaneityPropertiesThrombus Aging  +-----+---------------+---------+-----------+----------+--------------+ CFV  Full           No       Yes                                 +-----+---------------+---------+-----------+----------+--------------+   +---------+---------------+---------+-----------+----------+--------------+ LEFT     CompressibilityPhasicitySpontaneityPropertiesThrombus Aging +---------+---------------+---------+-----------+----------+--------------+ CFV      Full           No       Yes                                 +---------+---------------+---------+-----------+----------+--------------+ SFJ      Full                                                        +---------+---------------+---------+-----------+----------+--------------+ FV Prox  Full                                                        +---------+---------------+---------+-----------+----------+--------------+ FV Mid   Full                                                        +---------+---------------+---------+-----------+----------+--------------+ FV DistalFull                                                        +---------+---------------+---------+-----------+----------+--------------+ PFV      Full                                                        +---------+---------------+---------+-----------+----------+--------------+ POP      Full           No       Yes                                 +---------+---------------+---------+-----------+----------+--------------+ PTV      Full                                                        +---------+---------------+---------+-----------+----------+--------------+  PERO     Full                                                        +---------+---------------+---------+-----------+----------+--------------+     Summary: RIGHT: - No evidence of common femoral vein obstruction.  LEFT: - There is no evidence of deep vein thrombosis in the  lower extremity.  - No cystic structure found in the popliteal fossa. Lower extremity venous flow is pulsatile, suggestive of possibly elevated right heart pressure.  *See table(s) above for measurements and observations. Electronically signed by Fabienne Bruns MD on 03/29/2020 at 12:28:51 PM.    Final     Cardiac Studies   2D echo 03/28/2020 1. Left ventricular ejection fraction, by estimation, is 35 to 40%. The  left ventricle has moderately decreased function. The left ventricle  demonstrates regional wall motion abnormalities (see scoring  diagram/findings for description). The left  ventricular internal cavity size was mildly dilated. There is mild  concentric left ventricular hypertrophy. Left ventricular diastolic  parameters are consistent with Grade II diastolic dysfunction  (pseudonormalization). There is moderate hypokinesis of  the left ventricular, mid-apical inferoseptal wall and inferior wall.  There is severe hypokinesis of the left ventricular, mid-apical  anteroseptal wall.  2. Right ventricular systolic function is normal. The right ventricular  size is normal.  3. Left atrial size was severely dilated.  4. Right atrial size was mildly dilated.  5. Likely moderate eccentric MR.. The mitral valve is abnormal. Moderate  mitral valve regurgitation.  6. Fixed right coronary cusp with prominent focal calcification. Suspect  bicuspid valve, but obscured by significant calcification. Severe aortic  regurgitation, best seen on apical images, with mitral valve anterior  leaflet restriction secondary to jet.  Vena contracta 0.65 cm on 5 chamber apical, 0.80 cm on 3 chamber apical,  fills most of LVOT and projects into majority of LV cavity (see image 83).  Some diastolic flow reversal seen in descending aorta but not well  visualized.. The aortic valve has an  indeterminant number of cusps. Aortic valve regurgitation is severe. Mild  aortic valve stenosis.  7. Aortic  dilatation noted. There is borderline dilatation of the  ascending aorta measuring 38 mm.  8. The inferior vena cava is dilated in size with <50% respiratory  variability, suggesting right atrial pressure of 15 mmHg.   Patient Profile     64 y.o. male with a hx of DM and HTN, ETOH abuse and tobacco abuse who is being seen in consultation for the evaluation of acute pulmonary edema, LV dysfunction, moderate MR and severe AR at the request of Marguerita Merles, MD.  Assessment & Plan    Acute Respiratory Failure with flash pulmonary edema due to Acute systolic CHF -this occurred after IV contrast, but with cardiomyopathy and valve abnormalities, suspect there is a cardiac cause -will need further workup with right and left heart cath, see below -started on Losartan 25mg  daily and spiro 12.5mg  daily -diuresing on Lasix 40mg  IV BID, net negative 1.8 L overnight -follow strict I&O's and daily weights -starting low dose metoprolol q6 as he is warm on exam. Will need to consolidate to succinate prior to discharge.  Elevated troponin -he has not had any anginal sx but trend in hsTrop c/w ACS ([id number]). May also be demand in the setting  of pulmonary edema, but given abnormal echo, recommend cardiac cath for further evaluation. However, would exclude aortic dissection first--given severe AI and mildly enlarged root on echo. Cannot tell based on the imaging whether there is a dissection, but would not want to do cath if dissection is present. -troponin now downtrending, no further draws needed until he develops worsening symptoms -ordered gated CT angio from aortic valve to descending thoracic aorta -Risks and benefits of cardiac catheterization have been discussed with the patient.  These include bleeding, infection, kidney damage, stroke, heart attack, death.  The patient understands these risks and is willing to proceed. -plan for [facility] tomorrow -started aspirin, statin, beta blocker  today  Cardiomyopathy -EF 35-40 with wall motion abnormalities/severe LAE -cath as above to assist with etiology  AR-severe -echo with possible bicuspid AV with associated mild AS.   -RCC is fixed with prominent focal calcification with associated severe AI -will need TEE for further evaluation, unless clear etiology can be seen on CT scan. Likely will need TEE anyway to assess MR.  Moderate MR -there is anterior MV leaflet restriction due to severe AI jet -likely exacerbated by cardiomyopathy  Tobacco abuse -ongoing -The patient was counseled on tobacco cessation today fo r4 minutes.  Counseling included reviewing the risks of smoking tobacco products, how it impacts the patient's current medical diagnoses and different strategies for quitting.  Pharmacotherapy to aid in tobacco cessation was not prescribed today. He is going to quit.  Type II diabetes -HbA1C 10.2%, on no meds at home -adding SSI/accuchecks -starting statin today -on ARB, starting this admission -consider SGLT2i given reduced EF prior to discharge  Hyponatremia -improving with diuresis, supports hypervolemic hyponatremia -fluid restrict to 1.5L  Per primary/other consulting teams: Left leg weakness Leukocytosis with left shift Elevated d-dimer (U/S lower extremity pending)  For questions or updates, please contact [facility] Please consult [email or web address] for contact info under        Signed, Jodelle Red, MD  03/29/2020, 12:34 PM

## 2020-03-29 NOTE — Progress Notes (Signed)
PROGRESS NOTE    Scott Rios  ZOX:[id number] DOB: 06/29/1956 DOA: 03/27/2020 PCP: Elfredia Nevins, MD   Brief Narrative:  HPI per Dr. Frankey Shown on 03/28/20 Scott Rios is a 64 y.o. male with medical history significant for hypertension and diabetes not on home medication, tobacco abuse and prior history of alcohol abuse (quit 10 years ago) who presents to the emergency department due to 17-month history of right leg weakness.  Patient states that the right leg sometimes go out on him.  He complained of difficulty in being able to stand after using the toilet last evening and he ended up crawling into the living room.  Right leg was numb and weak and with more difficulty in being able to raise the leg.  He complained of chronic constipation with last bowel movement pain 8 days ago, however there was no complaint of back pain, bowel or bladder changes.  Right hand was only significant for occasional tingling sensation.  He denies fever, chills, chest pain, shortness of breath or having any sick contact.  ED Course:  In the emergency department, he was hemodynamically stable.  Work-up in the ED showed mild leukocytosis, hyponatremia, hyperglycemia, magnesium 1.4, troponin x 2 27> 121, hypoalbuminemia.  SARS coronavirus was negative.  Initial chest x-ray showed borderline cardiomegaly with diffuse interstitial thickening suspicious for pulmonary edema, possible pulmonary nodule was also noted in the right midlung.  Patient suddenly became acutely hypoxic, tachypneic and gasping for breath when he returned from CT.  IV Lasix was given, IV fluids were stopped and patient was placed on BiPAP, repeat a chest x-ray was consistent with mild diffuse pulmonary interstitial edema.  CT of head showed no acute acute abnormality.  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   IV thiamine 500 mg x 1 was given.  Hospitalist was asked to admit patient for further evaluation and management.  **Interim History  Patient's shortness of breath is improved with the Lasix and his leg weakness is improving slightly.  States that he has more mobility in his leg today than he did yesterday.  Cardiology following and recommending a CTA of the chest to rule out dissection and planning on doing a cardiac catheterization on him I will defer the timing of the cardiac catheterization to the cardiology team.  Assessment & Plan:   Principal Problem:   Right leg weakness Active Problems:   Flash pulmonary edema (HCC)   Hypomagnesemia   Hypoalbuminemia   Hyperglycemia   Hyponatremia   Elevated troponin   Pulmonary nodule   Acute respiratory failure with hypoxia (HCC)   Tobacco abuse   Acute pulmonary edema (HCC)   Weakness of right lower extremity   Acute systolic heart failure (HCC)   Nonrheumatic mitral valve regurgitation   Nonrheumatic aortic valve insufficiency  Right leg weakness rule out structural lesion in brain/lumbar spine, thiamine deficiency, CIDP -CT of head showed no acute acute abnormality.   -CT lumbar spine without contrast showed no acute abnormality within the lumbar spine but did show multifactorial degenerative changes at L4-5 with resultant severe spinal stenosis. -Continue IV thiamine 500 mg 3 times a day for 2 consecutive days, then -Continue IV thiamine 250mg  daily for 5 days (combination with other B vitamins) and then continue Thiamine well p.o. daily Nonemergent MRI brain and lumbar spine with and without contrast will be done per teleneurologist recommendation. 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 No acute osseous abnormality. Chronic L4 compression fracture deformity with mild height loss. Moderate to severe spinal canal  narrowing at the L4-5 level. Moderate right L2-3, right L4-5 neural foraminal narrowing. L3-4 disc bulge abutment of the exiting L3, descending L4 nerve roots. L5-S1 disc bulge abutment of the exiting L5 and descending S1 nerve roots." -Neurology Dr. Iver NestleBhagat evaluated the case and feels that this is a chronic issue and that there is no acute process. -PT/OT to evaluate and Treat recommending Home Health  Acute Respiratory failure with hypoxia requiring NIPPV secondary to flash pulmonary edema in the setting of Combined Acute Systolic and Diastolic CHF -Patient went into acute respiratory distress on arrival from CT -Initial CXR showed "Cardiomegaly with mild diffuse pulmonary interstitial edema.  Suspected underlying emphysema.  Aortic Atherosclerosis." -Repeat CXR showed diffuse interstitial edema  -D-Dimer was 0.84 -He was placed on BiPAP, IV Lasix 40 Mg x1 was given with subsequent improvement; Continuing IV Lasix 40 mg q12h -Echocardiogram checked and he has a new Cardiomyopathy -SpO2: 92 % O2 Flow Rate (L/min): 2 L/min FiO2 (%): 45 % -Cardiology will be consulted and recommended CTA Chest Aorta to r/o Dissection -CTA showed "No evidence of aortic dissection. Mild ectasia of the ascending thoracic aorta measuring 3.7 cm in AP diameter. Moderate size bilateral pleural effusions with associated bibasilar atelectasis. Patchy ground-glass opacification over the mid to upper lungs which may be due to infection or inflammatory process. 1.1 cm right paratracheal lymph node likely reactive.  Aortic atherosclerosis. Atherosclerotic coronary artery disease.." -C/w Diuresis with IV Lasix 40 mg BID -ECHO showed EF of 35-40% and Graed 2 DD -Also has Significant AR and MR -Repeat CXR in the AM -Cardiology recommending Right and Left Heart Cath -Cardiology has started Losartan 25 mg po Daily and Spironolactone 12.5 mg po Daily    Elevated Troponin possibly secondary to flash pulmonary edema with  associated bilateral pleural effusions  -Troponin 27 -> 121 -> 1479 -> 1747 -> 629 -EKG shows sinus rhythm at rate of 90 bpm with no significant change from prior EKG done on arrival. -Patient does not complain of any chest pain but did have shortness of breath and this could be an anginal equivalent given his uncontrolled diabetes mellitus type 2 -This may be due to type II demand ischemia vs NSTEMI -Continue to trend troponin -Cardiology consulted for further evaluation recommendations and feel that his elevation could be due to acute pulmonary edema in the setting of undiagnosed dilated cardiomyopathy but also could be due to CAD and they are recommending him undergoing a left and right heart cath but wanted to rule out aortic dissection first and CTA of the chest aorta showed no evidence of any dissection -Cardiology started on atorvastatin 80 mg p.o. daily  Dilated cardiomyopathy -He has an EF of 35 to 40% with wall motion abnormalities as well so we are left atrial enlargement -TSH has been normal -Cardiology starting low-dose ARB with losartan 25 mg p.o. daily as well as spironolactone 12.5 mg daily -Further care per cardiology; Cardiology also started the patient on Metoprolol Tartrate 12.5 mg po q6h  Elevated D-Dimer -CTA of the chest as above -Checking lower extremity venous duplex and showed no evidence of DVT.  Severe aortic regurgitation -Echocardiogram done -Cardiology recommending TEE for further evaluation  Mitral regurgitation  -Cardiology recommending TEE to assess his mitral  regurgitation and feel that this mitral regurgitation is likely exacerbated by his cardiomyopathy  Hypomagnesemia -Mg 1.4 on admission and repeat today was 1.6 -Replete with IV Mag Sulfate 2 grams -Continue to Monitor and Replete as Necessary -Repeat Magnesium in the AM  Hypoalbuminemia possibly secondary to mild protein calorie malnutrition -About 3.1; -Protein supplement will be  provided  Uncontrolled Diabetes Mellitus Type 2 with Hyperglycemia -Blood glucose level was 344, hemoglobin A1c was 10.2 -Continue with sensitive NovoLog sliding cell insulin before meals and at bedtime  -Continue to monitor blood sugars carefully; CBGs ranging from 165-278 and had a blood sugar of 221 on CMP today. -Cardiology started him on atorvastatin 80 mg daily  Incidental pulmonary nodule/paratracheal lymph node -Chest x-ray showed possible pulmonary nodule in the right mid lung -CT chest per recommendation will be considered when patient is more stable, considering that he developed flash pulmonary edema when he had CT of lumbar spine -CTA of the chest evaluating for aortic dissection showed patchy groundglass opacification of the mid to upper lungs which may be due to infection inflammatory process and there is a 1.1 cm right paratracheal lymph node which was likely reactive -Continue to monitor and follow in outpatient setting  Low Back Pain -CT lumbar spine without contrast showed no acute abnormality within the lumbar spine, but it showed:  Multifactorial degenerative changes at L4-5 with resultant severe spinal stenosis.  Left foraminal to extraforaminal disc protrusion at L3-4, potentially affecting the exiting left L3 nerve root. Chronic L4 compression fracture with mild 30% height loss and trace 2 mm bony retropulsion. -Continue Tylenol as needed for back pain -Continue PT/OT eval and treat. -MRI of the lumbar spine as above  Tobacco abuse -Patient counseled on tobacco abuse cessation and he acknowledged understanding discussion -Continue nicotine patch 21 mg TD   Hyponatremia -In the setting of hypervolemia as well as hyperglycemia -Continue with diuresis and control blood sugars -Sodium is slowly improving -Continue monitor and trend and repeat CMP in a.m.  Leukocytosis -Likely reactive -WBC went from 13.2 -> 13.7 -Continue to Monitor and Trend -Repeat CBC  in the AM  Normocytic Anemia -Patient's Hgb/Hct went from 13.5/39.5 -> 12.9/36.9 -Check Anemia Panel in the AM -Continue to Monitor for S/Sx of Bleeding; Currently no overt bleeding noted -Repeat CBC in AM   DVT prophylaxis: Will add Heparin 5,000 units sq q8h Code Status: FULL CODE  Family Communication: Discussed with Son at bedside  Disposition Plan: Pending further cardiac clearance and evaluation and likely will be home with home health PT OT is recommended this.  Status is: Inpatient  Remains inpatient appropriate because:Ongoing diagnostic testing needed not appropriate for outpatient work up, Unsafe d/c plan, IV treatments appropriate due to intensity of illness or inability to take PO and Inpatient level of care appropriate due to severity of illness   Dispo: The patient is from: Home              Anticipated d/c is to: Home              Anticipated d/c date is: 2 days              Patient currently is not medically stable to d/c.  Consultants:   Neurology  Cardiology   Procedures:  ECHOCARDIOGRAM IMPRESSIONS    1. Left ventricular ejection fraction, by estimation, is 35 to 40%. The  left ventricle has moderately decreased function. The left ventricle  demonstrates regional wall motion abnormalities (see scoring  diagram/findings  for description). The left  ventricular internal cavity size was mildly dilated. There is mild  concentric left ventricular hypertrophy. Left ventricular diastolic  parameters are consistent with Grade II diastolic dysfunction  (pseudonormalization). There is moderate hypokinesis of  the left ventricular, mid-apical inferoseptal wall and inferior wall.  There is severe hypokinesis of the left ventricular, mid-apical  anteroseptal wall.  2. Right ventricular systolic function is normal. The right ventricular  size is normal.  3. Left atrial size was severely dilated.  4. Right atrial size was mildly dilated.  5. Likely moderate  eccentric MR.. The mitral valve is abnormal. Moderate  mitral valve regurgitation.  6. Fixed right coronary cusp with prominent focal calcification. Suspect  bicuspid valve, but obscured by significant calcification. Severe aortic  regurgitation, best seen on apical images, with mitral valve anterior  leaflet restriction secondary to jet.  Vena contracta 0.65 cm on 5 chamber apical, 0.80 cm on 3 chamber apical,  fills most of LVOT and projects into majority of LV cavity (see image 83).  Some diastolic flow reversal seen in descending aorta but not well  visualized.. The aortic valve has an  indeterminant number of cusps. Aortic valve regurgitation is severe. Mild  aortic valve stenosis.  7. Aortic dilatation noted. There is borderline dilatation of the  ascending aorta measuring 38 mm.  8. The inferior vena cava is dilated in size with <50% respiratory  variability, suggesting right atrial pressure of 15 mmHg.   Comparison(s): No prior Echocardiogram.   Conclusion(s)/Recommendation(s): Abnormal study. Reduced EF with both  global and focal hypokinesis; mild global hypokinesis, with moderate wall  motion abnormalities in the mid to apical inferoseptal and inferior wall,  with severe hypokinesis in the mid  to apical anteroseptal walls. Also with severe aortic regurgitation,  calcified and likely bicuspid aortic valve with mild stenosis.   FINDINGS  Left Ventricle: Left ventricular ejection fraction, by estimation, is 35  to 40%. The left ventricle has moderately decreased function. The left  ventricle demonstrates regional wall motion abnormalities. Moderate  hypokinesis of the left ventricular,  mid-apical inferoseptal wall and inferior wall. Severe hypokinesis of the  left ventricular, mid-apical anteroseptal wall. The left ventricular  internal cavity size was mildly dilated. There is mild concentric left  ventricular hypertrophy. Left  ventricular diastolic parameters are  consistent with Grade II diastolic  dysfunction (pseudonormalization).     LV Wall Scoring:  The mid anteroseptal segment is akinetic. The mid and distal inferior  wall,  basal anteroseptal segment, mid inferoseptal segment, apical septal  segment,  and apex are hypokinetic. The entire anterior wall, entire lateral wall,  basal inferior segment, and basal inferoseptal segment are normal.   Right Ventricle: The right ventricular size is normal. No increase in  right ventricular wall thickness. Right ventricular systolic function is  normal.   Left Atrium: Left atrial size was severely dilated.   Right Atrium: Right atrial size was mildly dilated.   Pericardium: There is no evidence of pericardial effusion.   Mitral Valve: Likely moderate eccentric MR. The mitral valve is abnormal.  Moderate mitral annular calcification. Moderate mitral valve  regurgitation.   Tricuspid Valve: The tricuspid valve is normal in structure. Tricuspid  valve regurgitation is trivial. No evidence of tricuspid stenosis.   Aortic Valve: Fixed right coronary cusp with prominent focal  calcification. Suspect bicuspid valve, but obscured by significant  calcification. Severe aortic regurgitation, best seen on apical images,  with mitral valve anterior leaflet restriction  secondary to  jet. Vena contracta 0.65 cm on 5 chamber apical, 0.80 cm on 3  chamber apical, fills most of LVOT and projects into majority of LV cavity  (see image 83). Some diastolic flow reversal seen in descending aorta but  not well visualized. The  aortic valve has an indeterminant number of cusps. . There is moderate  thickening and moderate calcification of the aortic valve. Aortic valve  regurgitation is severe. Aortic regurgitation PHT measures 266 msec. Mild  aortic stenosis is present. There is  moderate thickening of the aortic valve. There is moderate calcification  of the aortic valve. Aortic valve mean gradient measures  12.0 mmHg. Aortic  valve peak gradient measures 22.3 mmHg. Aortic valve area, by VTI measures  1.76 cm.   Pulmonic Valve: The pulmonic valve was not well visualized. Pulmonic valve  regurgitation is not visualized.   Aorta: Aortic dilatation noted. There is borderline dilatation of the  ascending aorta measuring 38 mm.   Venous: The inferior vena cava is dilated in size with less than 50%  respiratory variability, suggesting right atrial pressure of 15 mmHg.   IAS/Shunts: No atrial level shunt detected by color flow Doppler.     LEFT VENTRICLE  PLAX 2D  LVIDd:     5.54 cm   Diastology  LVIDs:     4.40 cm   LV e' lateral:  5.00 cm/s  LV PW:     1.23 cm   LV E/e' lateral: 21.4  LV IVS:    1.18 cm  LVOT diam:   2.10 cm  LV SV:     70  LV SV Index:  37  LVOT Area:   3.46 cm    LV Volumes (MOD)  LV vol d, MOD A2C: 117.0 ml  LV vol d, MOD A4C: 106.0 ml  LV vol s, MOD A2C: 75.0 ml  LV vol s, MOD A4C: 83.4 ml  LV SV MOD A2C:   42.0 ml  LV SV MOD A4C:   106.0 ml  LV SV MOD BP:   32.1 ml   RIGHT VENTRICLE  RV S prime:   13.20 cm/s  TAPSE (M-mode): 2.7 cm   LEFT ATRIUM       Index    RIGHT ATRIUM      Index  LA diam:    4.30 cm 2.28 cm/m RA Area:   14.80 cm  LA Vol (A2C):  57.4 ml 30.41 ml/m RA Volume:  35.30 ml 18.70 ml/m  LA Vol (A4C):  81.8 ml 43.34 ml/m  LA Biplane Vol: 68.7 ml 36.40 ml/m  AORTIC VALVE  AV Area (Vmax):  1.67 cm  AV Area (Vmean):  1.74 cm  AV Area (VTI):   1.76 cm  AV Vmax:      236.00 cm/s  AV Vmean:     158.500 cm/s  AV VTI:      0.401 m  AV Peak Grad:   22.3 mmHg  AV Mean Grad:   12.0 mmHg  LVOT Vmax:     113.50 cm/s  LVOT Vmean:    79.550 cm/s  LVOT VTI:     0.204 m  LVOT/AV VTI ratio: 0.51  AI PHT:      266 msec    AORTA  Ao Root diam: 3.60 cm  Ao Asc diam: 3.80 cm   MITRAL VALVE  MV Area (PHT): 6.12 cm    SHUNTS  MV Decel Time: 124 msec   Systemic VTI: 0.20 m  MR Peak grad:  101.6  mmHg Systemic Diam: 2.10 cm  MR Mean grad:  63.0 mmHg  MR Vmax:    504.00 cm/s  MR Vmean:    376.0 cm/s  MR PISA:    1.01 cm  MR PISA Radius: 0.40 cm  MV E velocity: 107.00 cm/s  MV A velocity: 69.40 cm/s  MV E/A ratio: 1.54   Antimicrobials:  Anti-infectives (From admission, onward)   None     Subjective: Seen and examined at bedside and he is no longer short of breath.  Thinks he has more motion in his right leg.  No chest pain, lightheadedness or dizziness.  Shortness of breath is improved.  Denies any lightheadedness or dizziness.  No other concerns or complaints at this time.  Objective: Vitals:   03/29/20 0439 03/29/20 0750 03/29/20 1213 03/29/20 1616  BP: 127/69 124/69 109/61 (!) 103/58  Pulse: 90 90 86 76  Resp: 16   18  Temp: 98.1 F (36.7 C) 98.4 F (36.9 C) 98.4 F (36.9 C) 98.2 F (36.8 C)  TempSrc: Oral Oral Oral Oral  SpO2: 96% 97% 94% 92%  Weight: 66.9 kg     Height:        Intake/Output Summary (Last 24 hours) at 03/29/2020 1638 Last data filed at 03/29/2020 0500 Gross per 24 hour  Intake --  Output 725 ml  Net -725 ml   Filed Weights   03/28/20 1420 03/28/20 1503 03/29/20 0439  Weight: (!) 136.4 kg 67.2 kg 66.9 kg   Examination: Physical Exam:  Constitutional: WN/WD chronically ill-appearing Caucasian male currently in NAD and appears calm and comfortable Eyes: Lids and conjunctivae normal, sclerae anicteric  ENMT: External Ears, Nose appear normal. Grossly normal hearing.  Neck: Appears normal, supple, no cervical masses, normal ROM, no appreciable thyromegaly; slight JVD Respiratory: Diminished to auscultation bilaterally with coarse breath sounds and some crackles.  No appreciable wheezing, rales, rhonchi. Normal respiratory effort and patient is not tachypenic. No accessory muscle use.  Wearing supplemental oxygen via nasal cannula Cardiovascular:  RRR, no murmurs / rubs / gallops. S1 and S2 auscultated.  Has 1+ lower extremity edema. Abdomen: Soft, non-tender, non-distended.  Bowel sounds positive.  GU: Deferred. Musculoskeletal: No clubbing / cyanosis of digits/nails. No joint deformity upper and lower extremities.  Skin: No rashes, lesions, ulcers on limited skin evaluation. No induration; Warm and dry.  Neurologic: CN 2-12 grossly intact with no focal deficits. Romberg sign and cerebellar reflexes not assessed.  Psychiatric: Normal judgment and insight. Alert and oriented x 3. Normal mood and appropriate affect.   Data Reviewed: I have personally reviewed following labs and imaging studies  CBC: Recent Labs  Lab 03/27/20 2317 03/28/20 0324 03/29/20 0941  WBC 10.6* 13.2* 13.7*  NEUTROABS 7.5  --  10.7*  HGB 12.9* 13.5 12.9*  HCT 36.8* 39.5 36.9*  MCV 85.6 86.4 84.6  PLT 319 350 342   Basic Metabolic Panel: Recent Labs  Lab 03/27/20 2317 03/28/20 0324 03/29/20 0941  NA 126* 129* 130*  K 3.6 4.0 4.2  CL 93* 96* 96*  CO2 22 22 26   GLUCOSE 344* 286* 221*  BUN 8 9 7*  CREATININE 0.64 0.52* 0.57*  CALCIUM 8.5* 8.7* 8.9  MG 1.4*  --  1.6*  PHOS  --   --  3.6   GFR: Estimated Creatinine Clearance: 86.1 mL/min (A) (by C-G formula based on SCr of 0.57 mg/dL (L)). Liver Function Tests: Recent Labs  Lab 03/27/20 2317 03/28/20 0324 03/29/20 0941  AST 12* 12* 14*  ALT 9 10 11   ALKPHOS 59 62 57  BILITOT 0.3 0.4 0.7  PROT 5.7* 6.0* 5.5*  ALBUMIN 3.1* 3.1* 2.9*   Recent Labs  Lab 03/27/20 2317  LIPASE 17   No results for input(s): AMMONIA in the last 168 hours. Coagulation Profile: Recent Labs  Lab 03/28/20 0324  INR 1.1   Cardiac Enzymes: No results for input(s): CKTOTAL, CKMB, CKMBINDEX, TROPONINI in the last 168 hours. BNP (last 3 results) No results for input(s): PROBNP in the last 8760 hours. HbA1C: Recent Labs    03/28/20 0324  HGBA1C 10.2*   CBG: Recent Labs  Lab 03/29/20 1210  03/29/20 1614  GLUCAP 278* 165*   Lipid Profile: No results for input(s): CHOL, HDL, LDLCALC, TRIG, CHOLHDL, LDLDIRECT in the last 72 hours. Thyroid Function Tests: Recent Labs    03/29/20 0941  TSH 2.465   Anemia Panel: Recent Labs    03/28/20 0811 03/28/20 1221  VITAMINB12 122*  --   FOLATE  --  6.7   Sepsis Labs: No results for input(s): PROCALCITON, LATICACIDVEN in the last 168 hours.  Recent Results (from the past 240 hour(s))  SARS Coronavirus 2 by RT PCR (hospital order, performed in [facility] hospital lab) Nasopharyngeal Nasopharyngeal Swab     Status: None   Collection Time: 03/28/20  2:20 AM   Specimen: Nasopharyngeal Swab  Result Value Ref Range Status   SARS Coronavirus 2 NEGATIVE NEGATIVE Final    Comment: (NOTE) SARS-CoV-2 target nucleic acids are NOT DETECTED.  The SARS-CoV-2 RNA is generally detectable in upper and lower respiratory specimens during the acute phase of infection. The lowest concentration of SARS-CoV-2 viral copies this assay can detect is 250 copies / mL. A negative result does not preclude SARS-CoV-2 infection and should not be used as the sole basis for treatment or other patient management decisions.  A negative result may occur with improper specimen collection / handling, submission of specimen other than nasopharyngeal swab, presence of viral mutation(s) within the areas targeted by this assay, and inadequate number of viral copies (<250 copies / mL). A negative result must be combined with clinical observations, patient history, and epidemiological information.  Fact Sheet for Patients:   BoilerBrush.com.cy  Fact Sheet for Healthcare Providers: https://pope.com/  This test is not yet approved or  cleared by the [place] FDA and has been authorized for detection and/or diagnosis of SARS-CoV-2 by FDA under an Emergency Use Authorization (EUA).  This EUA will remain in effect  (meaning this test can be used) for the duration of the COVID-19 declaration under Section 564(b)(1) of the Act, 21 U.S.C. section 360bbb-3(b)(1), unless the authorization is terminated or revoked sooner.  Performed at [facility], [address], [place], [place] [address]      RN Pressure Injury Documentation:     Estimated body mass index is 23.11 kg/m as calculated from the following:   Height as of this encounter: 5\' 7"  (1.702 m).   Weight as of this encounter: 66.9 kg.  Malnutrition Type:      Malnutrition Characteristics:      Nutrition Interventions:    Radiology Studies: CT Head Wo Contrast  Result Date: 03/28/2020 CLINICAL DATA:  Neuro deficit, acute, stroke suspected Patient reports numbness and tingling of both legs for 2 weeks. EXAM: CT HEAD WITHOUT CONTRAST TECHNIQUE: Contiguous axial images were obtained from the base of the skull through the vertex without intravenous contrast. COMPARISON:  Head CT 09/29/2012 FINDINGS: Brain: Generalized atrophy with slight progression  from prior exam. There is mild chronic small vessel ischemia. No intracranial hemorrhage, mass effect, or midline shift. No hydrocephalus. The basilar cisterns are patent. No evidence of territorial infarct or acute ischemia. No extra-axial or intracranial fluid collection. Vascular: Atherosclerosis of skullbase vasculature without hyperdense vessel or abnormal calcification. Skull: No fracture or focal lesion. Sinuses/Orbits: Paranasal sinuses and mastoid air cells are clear. The visualized orbits are unremarkable. Other: None. IMPRESSION: 1. No acute intracranial abnormality. 2. Generalized atrophy and chronic small vessel ischemia. Electronically Signed   By: Narda Rutherford M.D.   On: 03/28/2020 03:27   CT Lumbar Spine Wo Contrast  Result Date: 03/28/2020 CLINICAL DATA:  Initial evaluation for acute low back pain, lower extremity numbness and tingling. EXAM: CT LUMBAR SPINE WITHOUT CONTRAST  TECHNIQUE: Multidetector CT imaging of the lumbar spine was performed without intravenous contrast administration. Multiplanar CT image reconstructions were also generated. COMPARISON:  None available. FINDINGS: Segmentation: Standard. Alignment: Physiologic.  No listhesis or malalignment. Vertebrae: Chronic compression deformity involving the superior endplate of L4 with mild 30% height loss and trace 2 mm bony retropulsion. Vertebral body height otherwise maintained. No acute osseous abnormality. Visualized sacrum and pelvis intact. SI joints approximated symmetric. No discrete or worrisome osseous lesions. Paraspinal and other soft tissues: Paraspinous soft tissues within normal limits. Moderate distension of the visualized urinary bladder. Enlarged prostate noted. Advanced aorto bi-iliac atherosclerotic disease. Visualized visceral structures otherwise unremarkable. Disc levels: L1-2: Negative interspace. Minimal facet hypertrophy. No stenosis or impingement. L2-3: Mild circumferential disc bulge. Mild bilateral facet hypertrophy. No significant spinal stenosis. Foramina remain patent. L3-4: Mild diffuse disc bulge with disc desiccation. Trace bony retropulsion related to the L4 compression fracture. There is a superimposed broad left foraminal to extraforaminal disc protrusion, closely approximating and potentially affecting the exiting left L3 nerve root (series 4, image 60). Mild facet and ligament flavum hypertrophy. Resultant moderate spinal stenosis. Mild left L3 foraminal narrowing. No significant right foraminal encroachment. L4-5: Diffuse disc bulge. Moderate facet and ligament flavum hypertrophy. Resultant severe canal and bilateral lateral recess stenosis. Mild to moderate bilateral L4 foraminal narrowing. L5-S1: Mild disc bulge. Mild to moderate facet hypertrophy. No significant spinal stenosis. Foramina remain patent. IMPRESSION: 1. No acute abnormality within the lumbar spine. 2. Multifactorial  degenerative changes at L4-5 with resultant severe spinal stenosis. 3. Left foraminal to extraforaminal disc protrusion at L3-4, potentially affecting the exiting left L3 nerve root. 4. Chronic L4 compression fracture with mild 30% height loss and trace 2 mm bony retropulsion. 5. Aortic Atherosclerosis (ICD10-I70.0). Electronically Signed   By: Rise Mu M.D.   On: 03/28/2020 03:34   MR Brain W and Wo Contrast  Result Date: 03/28/2020 CLINICAL DATA:  Neuro deficit EXAM: MRI HEAD WITHOUT AND WITH CONTRAST TECHNIQUE: Multiplanar, multiecho pulse sequences of the brain and surrounding structures were obtained without and with intravenous contrast. CONTRAST:  6.6mL GADAVIST GADOBUTROL 1 MMOL/ML IV SOLN COMPARISON:  03/28/2020 head CT and prior. FINDINGS: Please note that motion artifact limits evaluation. Brain: Scattered T2 hyperintense foci involving the periventricular/subcortical white matter and pons are nonspecific however commonly associated with chronic microvascular ischemic changes. No acute infarct or intracranial hemorrhage. No midline shift, ventriculomegaly or extra-axial fluid collection. No mass lesion. No abnormal enhancement. Vascular: Normal flow voids. Skull and upper cervical spine: Normal marrow signal. Sinuses/Orbits: Normal orbits. Clear paranasal sinuses. Trace bilateral mastoid effusion. Other: None. IMPRESSION: No acute intracranial process. Mild chronic microvascular ischemic changes. Electronically Signed   By: Thelma Barge.D.  On: 03/28/2020 17:39   MR Lumbar Spine W Wo Contrast  Result Date: 03/28/2020 CLINICAL DATA:  Low back pain EXAM: MRI LUMBAR SPINE WITHOUT AND WITH CONTRAST TECHNIQUE: Multiplanar and multiecho pulse sequences of the lumbar spine were obtained without and with intravenous contrast. CONTRAST:  6.38mL GADAVIST GADOBUTROL 1 MMOL/ML IV SOLN COMPARISON:  03/28/2020 CT L-spine. FINDINGS: Please note that motion artifact limits evaluation.  Segmentation:  Standard. Alignment:  Straightening of lordosis.  No listhesis. Vertebrae: Normal bone marrow signal intensity. 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 Chronic superior L4 endplate fracture deformity with approximately 30% height loss. No significant retropulsion. No acute fracture. Conus medullaris and cauda equina: Conus extends to the L1 level. Conus and cauda equina appear normal. Disc levels: Multilevel desiccation, endplate osteophytosis and mild disc space loss. 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 L1-2: Disc bulge with superimposed right foraminal protrusion and bilateral facet hypertrophy. Patent spinal canal and bilateral neural foramen. L2-3: Disc bulge abutting the bilateral descending L3 nerve roots and bilateral facet hypertrophy. Patent spinal canal. Mild left and moderate right neural foraminal narrowing. L3-4: Disc bulge abutting the exiting L3 and descending L4 nerve roots. Superimposed left foraminal/extraforaminal protrusion. Ligamentum flavum and bilateral facet hypertrophy. Mild spinal canal and bilateral neural foraminal narrowing. L4-5: Disc bulge with superimposed bilateral extraforaminal protrusions. There is also a superimposed central protrusion. Ligamentum flavum and bilateral facet hypertrophy. Moderate to severe spinal canal narrowing. Moderate right and mild left neural foraminal narrowing. L5-S1: Disc bulge abutting the exiting bilateral L5 nerve roots with superimposed central protrusion. Bilateral facet hypertrophy. There is also abutment of the descending bilateral S1 nerve roots. Patent spinal canal and bilateral neural foramina. Paraspinal and other soft tissues: Paraspinal soft tissue within normal limits. 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 No acute osseous abnormality. Chronic L4 compression fracture deformity with mild height loss. Moderate to severe spinal canal narrowing at the L4-5 level. Moderate  right L2-3, right L4-5 neural foraminal narrowing. L3-4 disc bulge abutment of the exiting L3, descending L4 nerve roots. L5-S1 disc bulge abutment of the exiting L5 and descending S1 nerve roots. Electronically Signed   By: Stana Bunting M.D.   On: 03/28/2020 17:58   DG CHEST PORT 1 VIEW  Result Date: 03/29/2020 CLINICAL DATA:  Hypoxia EXAM: PORTABLE CHEST 1 VIEW COMPARISON:  03/28/2020 FINDINGS: Cardiac shadow is stable. The lungs are well aerated bilaterally. Skin fold is noted over the left base laterally. Diffuse interstitial edema is again identified and stable. No focal infiltrate is seen. IMPRESSION: Stable interstitial edema. Electronically Signed   By: Alcide Clever M.D.   On: 03/29/2020 07:20   DG Chest Portable 1 View  Result Date: 03/28/2020 CLINICAL DATA:  Initial evaluation for acute onset shortness of breath. EXAM: PORTABLE CHEST 1 VIEW COMPARISON:  Prior radiograph from 03/28/2020. FINDINGS: Cardiomegaly, stable. Mediastinal silhouette within normal limits. Aortic atherosclerosis. Lungs well inflated. Suspected underlying emphysematous changes. Superimposed interstitial prominence with a few scattered Kerley B lines, suggestive of a degree of superimposed interstitial edema. No consolidative airspace disease. No definite pleural effusion. No pneumothorax. 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 No acute osseous finding. IMPRESSION: 1. Cardiomegaly with mild diffuse pulmonary interstitial edema. 2. Suspected underlying emphysema. 3.  Aortic Atherosclerosis (ICD10-I70.0). Electronically Signed   By: Rise Mu M.D.   On: 03/28/2020 04:04   DG Chest Portable 1 View  Result Date: 03/28/2020 CLINICAL DATA:  Weakness. Patient reports numbness and tingling of both legs.  EXAM: PORTABLE CHEST 1 VIEW COMPARISON:  Radiograph 09/29/2012 FINDINGS: Borderline cardiomegaly. Mild aortic tortuosity. Diffuse interstitial thickening with suggestion of  Kerley B-lines. Possible nodular density in the right mid lung, indeterminate. No evidence of pneumonia. No significant pleural effusion. No pneumothorax. Stable sclerotic density in the left proximal humerus likely an enchondroma. IMPRESSION: 1. Borderline cardiomegaly with diffuse interstitial thickening, suspicious for pulmonary edema. 2. Possible pulmonary nodule in the right mid lung. Consider chest CT on an elective basis for further evaluation. Electronically Signed   By: Narda Rutherford M.D.   On: 03/28/2020 00:41   CT ANGIO CHEST AORTA W/CM & OR WO/CM  Result Date: 03/29/2020 CLINICAL DATA:  Exclude aortic dissection.  Preop AVR planning. EXAM: CT ANGIOGRAPHY CHEST WITH CONTRAST TECHNIQUE: Multidetector CT imaging of the chest was performed using the standard protocol during bolus administration of intravenous contrast. Multiplanar CT image reconstructions and MIPs were obtained to evaluate the vascular anatomy. CONTRAST:  75mL OMNIPAQUE IOHEXOL 350 MG/ML SOLN COMPARISON:  None. FINDINGS: Cardiovascular: Heart is normal size. There is a small amount of pericardial fluid present. Moderate calcified plaque over the 3 vessel coronary arteries. Mild ectasia of the ascending thoracic aorta measuring 3.7 cm in AP diameter. Aortic root measures 4.1 cm and sinotubular junction 3 cm. No evidence of dissection. Mild calcified plaque over the thoracic aorta. Mild focal noncalcified plaque along the lateral aspect of the arch just distal to the takeoff of the left subclavian artery. Normal 3 vessel takeoff from the aortic arch. Noncalcified plaque over the origin of the left subclavian artery. Remaining vascular structures are unremarkable. Mediastinum/Nodes: 1.1 cm right paratracheal lymph node likely reactive. No significant hilar adenopathy. Remaining mediastinal structures are normal. Lungs/Pleura: Moderate size bilateral pleural effusions with associated bibasilar atelectasis. Patchy areas of ground-glass  opacification over the mid to upper lungs which may be due to infection. 3 mm and 4 mm nodules mild debris along the distal tracheal wall of uncertain clinical significance. Remaining airways are unremarkable. Over the right lower lobe. Upper Abdomen: Previous cholecystectomy. Calcified and noncalcified plaque over the abdominal aorta. No acute findings. Musculoskeletal: Mild degenerative change of the spine. Review of the MIP images confirms the above findings. IMPRESSION: 1. No evidence of aortic dissection. Mild ectasia of the ascending thoracic aorta measuring 3.7 cm in AP diameter. 2. Moderate size bilateral pleural effusions with associated bibasilar atelectasis. Patchy ground-glass opacification over the mid to upper lungs which may be due to infection or inflammatory process. 1.1 cm right paratracheal lymph node likely reactive. 3. Aortic atherosclerosis. Atherosclerotic coronary artery disease. Aortic Atherosclerosis (ICD10-I70.0). Electronically Signed   By: Elberta Fortis M.D.   On: 03/29/2020 15:26   ECHOCARDIOGRAM COMPLETE  Result Date: 03/28/2020    ECHOCARDIOGRAM REPORT   Patient Name:   Scott Rios Date of Exam: 03/28/2020 Medical Rec #:  [id number]      Height:       67.0 in Accession #:    [id number]     Weight:       170.0 lb Date of Birth:  [birth year]      BSA:          1.887 m Patient Age:    64 years       BP:           128/68 mmHg Patient Gender: M              HR:           89 bpm. Exam Location:  Inpatient Procedure: 2D Echo STAT ECHO Indications:    elevated troponin  History:        Patient has no prior history of Echocardiogram examinations.                 Signs/Symptoms:elevated troponin; Risk Factors:Hypertension,                 Diabetes and Current Smoker.  Sonographer:    Delcie Roch RDCS Referring Phys: [id number] OLADAPO ADEFESO IMPRESSIONS  1. Left ventricular ejection fraction, by estimation, is 35 to 40%. The left ventricle has moderately decreased function. The left  ventricle demonstrates regional wall motion abnormalities (see scoring diagram/findings for description). The left ventricular internal cavity size was mildly dilated. There is mild concentric left ventricular hypertrophy. Left ventricular diastolic parameters are consistent with Grade II diastolic dysfunction (pseudonormalization). There is moderate hypokinesis of the left ventricular, mid-apical inferoseptal wall and inferior wall. There is severe hypokinesis of the left ventricular, mid-apical anteroseptal wall.  2. Right ventricular systolic function is normal. The right ventricular size is normal.  3. Left atrial size was severely dilated.  4. Right atrial size was mildly dilated.  5. Likely moderate eccentric MR.. The mitral valve is abnormal. Moderate mitral valve regurgitation.  6. Fixed right coronary cusp with prominent focal calcification. Suspect bicuspid valve, but obscured by significant calcification. Severe aortic regurgitation, best seen on apical images, with mitral valve anterior leaflet restriction secondary to jet.  Vena contracta 0.65 cm on 5 chamber apical, 0.80 cm on 3 chamber apical, fills most of LVOT and projects into majority of LV cavity (see image 83). Some diastolic flow reversal seen in descending aorta but not well visualized.. The aortic valve has an indeterminant number of cusps. Aortic valve regurgitation is severe. Mild aortic valve stenosis.  7. Aortic dilatation noted. There is borderline dilatation of the ascending aorta measuring 38 mm.  8. The inferior vena cava is dilated in size with <50% respiratory variability, suggesting right atrial pressure of 15 mmHg. Comparison(s): No prior Echocardiogram. Conclusion(s)/Recommendation(s): Abnormal study. Reduced EF with both global and focal hypokinesis; mild global hypokinesis, with moderate wall motion abnormalities in the mid to apical inferoseptal and inferior wall, with severe hypokinesis in the mid to apical anteroseptal  walls. Also with severe aortic regurgitation, calcified and likely bicuspid aortic valve with mild stenosis. FINDINGS  Left Ventricle: Left ventricular ejection fraction, by estimation, is 35 to 40%. The left ventricle has moderately decreased function. The left ventricle demonstrates regional wall motion abnormalities. Moderate hypokinesis of the left ventricular, mid-apical inferoseptal wall and inferior wall. Severe hypokinesis of the left ventricular, mid-apical anteroseptal wall. The left ventricular internal cavity size was mildly dilated. There is mild concentric left ventricular hypertrophy. Left ventricular diastolic parameters are consistent with Grade II diastolic dysfunction (pseudonormalization).  LV Wall Scoring: The mid anteroseptal segment is akinetic. The mid and distal inferior wall, basal anteroseptal segment, mid inferoseptal segment, apical septal segment, and apex are hypokinetic. The entire anterior wall, entire lateral wall, basal inferior segment, and basal inferoseptal segment are normal. Right Ventricle: The right ventricular size is normal. No increase in right ventricular wall thickness. Right ventricular systolic function is normal. Left Atrium: Left atrial size was severely dilated. Right Atrium: Right atrial size was mildly dilated. Pericardium: There is no evidence of pericardial effusion. Mitral Valve: Likely moderate eccentric MR. The mitral valve is abnormal. Moderate mitral annular calcification. Moderate mitral valve regurgitation. Tricuspid Valve: The tricuspid valve is normal in structure. Tricuspid valve  regurgitation is trivial. No evidence of tricuspid stenosis. Aortic Valve: Fixed right coronary cusp with prominent focal calcification. Suspect bicuspid valve, but obscured by significant calcification. Severe aortic regurgitation, best seen on apical images, with mitral valve anterior leaflet restriction secondary to jet. Vena contracta 0.65 cm on 5 chamber apical, 0.80 cm  on 3 chamber apical, fills most of LVOT and projects into majority of LV cavity (see image 83). Some diastolic flow reversal seen in descending aorta but not well visualized. The aortic valve has an indeterminant number of cusps. . There is moderate thickening and moderate calcification of the aortic valve. Aortic valve regurgitation is severe. Aortic regurgitation PHT measures 266 msec. Mild aortic stenosis is present. There is moderate thickening of the aortic valve. There is moderate calcification of the aortic valve. Aortic valve mean gradient measures 12.0 mmHg. Aortic valve peak gradient measures 22.3 mmHg. Aortic valve area, by VTI measures 1.76 cm. Pulmonic Valve: The pulmonic valve was not well visualized. Pulmonic valve regurgitation is not visualized. Aorta: Aortic dilatation noted. There is borderline dilatation of the ascending aorta measuring 38 mm. Venous: The inferior vena cava is dilated in size with less than 50% respiratory variability, suggesting right atrial pressure of 15 mmHg. IAS/Shunts: No atrial level shunt detected by color flow Doppler.  LEFT VENTRICLE PLAX 2D LVIDd:         5.54 cm      Diastology LVIDs:         4.40 cm      LV e' lateral:   5.00 cm/s LV PW:         1.23 cm      LV E/e' lateral: 21.4 LV IVS:        1.18 cm LVOT diam:     2.10 cm LV SV:         70 LV SV Index:   37 LVOT Area:     3.46 cm  LV Volumes (MOD) LV vol d, MOD A2C: 117.0 ml LV vol d, MOD A4C: 106.0 ml LV vol s, MOD A2C: 75.0 ml LV vol s, MOD A4C: 83.4 ml LV SV MOD A2C:     42.0 ml LV SV MOD A4C:     106.0 ml LV SV MOD BP:      32.1 ml RIGHT VENTRICLE RV S prime:     13.20 cm/s TAPSE (M-mode): 2.7 cm LEFT ATRIUM             Index       RIGHT ATRIUM           Index LA diam:        4.30 cm 2.28 cm/m  RA Area:     14.80 cm LA Vol (A2C):   57.4 ml 30.41 ml/m RA Volume:   35.30 ml  18.70 ml/m LA Vol (A4C):   81.8 ml 43.34 ml/m LA Biplane Vol: 68.7 ml 36.40 ml/m  AORTIC VALVE AV Area (Vmax):    1.67 cm AV Area  (Vmean):   1.74 cm AV Area (VTI):     1.76 cm AV Vmax:           236.00 cm/s AV Vmean:          158.500 cm/s AV VTI:            0.401 m AV Peak Grad:      22.3 mmHg AV Mean Grad:      12.0 mmHg LVOT Vmax:         113.50  cm/s LVOT Vmean:        79.550 cm/s LVOT VTI:          0.204 m LVOT/AV VTI ratio: 0.51 AI PHT:            266 msec  AORTA Ao Root diam: 3.60 cm Ao Asc diam:  3.80 cm MITRAL VALVE MV Area (PHT): 6.12 cm     SHUNTS MV Decel Time: 124 msec     Systemic VTI:  0.20 m MR Peak grad:   101.6 mmHg  Systemic Diam: 2.10 cm MR Mean grad:   63.0 mmHg MR Vmax:        504.00 cm/s MR Vmean:       376.0 cm/s MR PISA:        1.01 cm MR PISA Radius: 0.40 cm MV E velocity: 107.00 cm/s MV A velocity: 69.40 cm/s MV E/A ratio:  1.54 Jodelle Red MD Electronically signed by Jodelle Red MD Signature Date/Time: 03/28/2020/12:53:56 PM    Final    VAS [place] LOWER EXTREMITY VENOUS (DVT)  Result Date: 03/29/2020  Lower Venous DVTStudy Indications: Swelling.  Comparison Study: No prior study Performing Technologist: Gertie Fey MHA, RDMS, RVT, RDCS  Examination Guidelines: A complete evaluation includes B-mode imaging, spectral Doppler, color Doppler, and power Doppler as needed of all accessible portions of each vessel. Bilateral testing is considered an integral part of a complete examination. Limited examinations for reoccurring indications may be performed as noted. The reflux portion of the exam is performed with the patient in reverse Trendelenburg.  +-----+---------------+---------+-----------+----------+--------------+ RIGHTCompressibilityPhasicitySpontaneityPropertiesThrombus Aging +-----+---------------+---------+-----------+----------+--------------+ CFV  Full           No       Yes                                 +-----+---------------+---------+-----------+----------+--------------+   +---------+---------------+---------+-----------+----------+--------------+ LEFT      CompressibilityPhasicitySpontaneityPropertiesThrombus Aging +---------+---------------+---------+-----------+----------+--------------+ CFV      Full           No       Yes                                 +---------+---------------+---------+-----------+----------+--------------+ SFJ      Full                                                        +---------+---------------+---------+-----------+----------+--------------+ FV Prox  Full                                                        +---------+---------------+---------+-----------+----------+--------------+ FV Mid   Full                                                        +---------+---------------+---------+-----------+----------+--------------+ FV DistalFull                                                        +---------+---------------+---------+-----------+----------+--------------+  PFV      Full                                                        +---------+---------------+---------+-----------+----------+--------------+ POP      Full           No       Yes                                 +---------+---------------+---------+-----------+----------+--------------+ PTV      Full                                                        +---------+---------------+---------+-----------+----------+--------------+ PERO     Full                                                        +---------+---------------+---------+-----------+----------+--------------+     Summary: RIGHT: - No evidence of common femoral vein obstruction.  LEFT: - There is no evidence of deep vein thrombosis in the lower extremity.  - No cystic structure found in the popliteal fossa. Lower extremity venous flow is pulsatile, suggestive of possibly elevated right heart pressure.  *See table(s) above for measurements and observations. Electronically signed by Fabienne Bruns MD on 03/29/2020 at 12:28:51 PM.    Final     Scheduled Meds: . aspirin EC  81 mg Oral Daily  . atorvastatin  80 mg Oral Daily  . Chlorhexidine Gluconate Cloth  6 each Topical Daily  . cyanocobalamin  1,000 mcg Intramuscular Daily  . feeding supplement (GLUCERNA SHAKE)  237 mL Oral TID BM  . furosemide  40 mg Intravenous BID  . insulin aspart  0-5 Units Subcutaneous QHS  . insulin aspart  0-9 Units Subcutaneous TID WC  . losartan  25 mg Oral Daily  . metoprolol tartrate  12.5 mg Oral Q6H  . multivitamin with minerals  1 tablet Oral Daily  . nicotine  21 mg Transdermal Daily  . polyethylene glycol  17 g Oral BID  . senna-docusate  1 tablet Oral BID  . spironolactone  12.5 mg Oral Daily   Continuous Infusions: . [START ON 03/30/2020] thiamine injection    . thiamine injection 500 mg (03/29/20 1236)    LOS: 1 day   Merlene Laughter, DO [facility] PAGER is on AMION  If 7PM-7AM, please contact night-coverage [email or web address]

## 2020-03-29 NOTE — Evaluation (Signed)
Physical Therapy Evaluation Patient Details Name: Scott Rios MRN: [id number] DOB: 08-17-1955 Today's Date: 03/29/2020   History of Present Illness  64 y.o. male with a hx of DM and HTN, ETOH abuse and tobacco abusewho is being seen in consultation for the evaluation of acute pulmonary edema, LV dysfunction, moderate MR and severe ARat the request of Marguerita Merles, MD.  Clinical Impression  Pt admitted with above diagnosis. Pt was able to ambulate in hallway with RW with min assist overall due to need for cues for sequecing steps and RW as well as cues to stay close to rW. Son states he always assists pt at home.  HHOT appropriate for pt and pt does well with RW therefore recommend obtaining a RW as well.  Pt currently with functional limitations due to the deficits listed below (see PT Problem List). Pt will benefit from skilled PT to increase their independence and safety with mobility to allow discharge to the venue listed below.      Follow Up Recommendations Home health PT;Supervision/Assistance - 24 hour (HHOT)    Equipment Recommendations  Rolling walker with 5" wheels    Recommendations for Other Services       Precautions / Restrictions Precautions Precautions: Fall Restrictions Weight Bearing Restrictions: No      Mobility  Bed Mobility Overal bed mobility: Needs Assistance Bed Mobility: Supine to Sit     Supine to sit: Min guard     General bed mobility comments: needs cues per son  Transfers Overall transfer level: Needs assistance Equipment used: Rolling walker (2 wheeled) Transfers: Sit to/from Stand Sit to Stand: Min assist         General transfer comment: Needed min assist and use of grab bar from toilet which pt was sitting on on arrival to room.   Ambulation/Gait Ambulation/Gait assistance: Min assist Gait Distance (Feet): 120 Feet Assistive device: Rolling walker (2 wheeled) Gait Pattern/deviations: Step-through pattern;Decreased stride  length;Drifts right/left;Trunk flexed   Gait velocity interpretation: <1.31 ft/sec, indicative of household ambulator General Gait Details: Pt was able to ambulate with RW with min assist for stability at times and to sequence steps and RW as well as to stay close to RW. Son states he helps pt at home at all times.   Stairs            Wheelchair Mobility    Modified Rankin (Stroke Patients Only)       Balance Overall balance assessment: Needs assistance Sitting-balance support: No upper extremity supported;Feet supported Sitting balance-Leahy Scale: Fair     Standing balance support: Bilateral upper extremity supported;During functional activity Standing balance-Leahy Scale: Poor Standing balance comment: relies on UE support                              Pertinent Vitals/Pain Pain Assessment: Faces Faces Pain Scale: Hurts little more Pain Location: bil LES Pain Descriptors / Indicators: Discomfort Pain Intervention(s): Limited activity within patient's tolerance;Monitored during session;Repositioned    Home Living Family/patient expects to be discharged to:: Private residence Living Arrangements: Children Available Help at Discharge: Family;Available 24 hours/day Type of Home: House Home Access: Stairs to enter Entrance Stairs-Rails: None Entrance Stairs-Number of Steps: 3 Home Layout: One level Home Equipment: Cane - single point      Prior Function Level of Independence: Independent with assistive device(s)         Comments: doing well with cane PTa, bathes and dresses self.  Son present and stated that he needed help wtih walking as he was unbalanced at times     Hand Dominance   Dominant Hand: Right    Extremity/Trunk Assessment   Upper Extremity Assessment Upper Extremity Assessment: Defer to OT evaluation    Lower Extremity Assessment Lower Extremity Assessment: Generalized weakness, right LE slightly weaker than left but grossly  3/5    Cervical / Trunk Assessment Cervical / Trunk Assessment: Kyphotic  Communication   Communication: No difficulties  Cognition Arousal/Alertness: Awake/alert Behavior During Therapy: WFL for tasks assessed/performed Overall Cognitive Status: Within Functional Limits for tasks assessed                                        General Comments  Reports numbness bil LEs is better.     Exercises     Assessment/Plan    PT Assessment Patient needs continued PT services  PT Problem List Decreased activity tolerance;Decreased balance;Decreased mobility;Decreased knowledge of use of DME;Decreased safety awareness;Decreased knowledge of precautions       PT Treatment Interventions DME instruction;Gait training;Stair training;Functional mobility training;Therapeutic activities;Therapeutic exercise;Balance training;Patient/family education    PT Goals (Current goals can be found in the Care Plan section)  Acute Rehab PT Goals Patient Stated Goal: to go home PT Goal Formulation: With patient Time For Goal Achievement: 04/12/20 Potential to Achieve Goals: Good    Frequency Min 3X/week   Barriers to discharge        Co-evaluation               AM-PAC PT "6 Clicks" Mobility  Outcome Measure Help needed turning from your back to your side while in a flat bed without using bedrails?: None Help needed moving from lying on your back to sitting on the side of a flat bed without using bedrails?: A Little Help needed moving to and from a bed to a chair (including a wheelchair)?: A Little Help needed standing up from a chair using your arms (e.g., wheelchair or bedside chair)?: A Little Help needed to walk in hospital room?: A Little Help needed climbing 3-5 steps with a railing? : A Little 6 Click Score: 19    End of Session Equipment Utilized During Treatment: Gait belt Activity Tolerance: Patient limited by fatigue Patient left: in chair;with call  bell/phone within reach;with chair alarm set;with family/visitor present Nurse Communication: Mobility status PT Visit Diagnosis: Unsteadiness on feet (R26.81);Muscle weakness (generalized) (M62.81)    Time: 1829-9371 PT Time Calculation (min) (ACUTE ONLY): 23 min   Charges:   PT Evaluation $PT Eval Moderate Complexity: 1 Mod PT Treatments $Gait Training: 8-22 mins        Takeshi Teasdale W,PT Acute Rehabilitation Services Pager:  [phone]  Office:  [phone]    Berline Lopes 03/29/2020, 1:09 PM

## 2020-03-29 NOTE — Evaluation (Signed)
Occupational Therapy Evaluation Patient Details Name: Scott Rios MRN: [id number] DOB: 11-05-1955 Today's Date: 03/29/2020    History of Present Illness 64 y.o. male with a hx of DM and HTN, ETOH abuse and tobacco abusewho is being seen in consultation for the evaluation of acute pulmonary edema, LV dysfunction, moderate MR and severe ARat the request of Marguerita Merles, MD.   Clinical Impression   This 64 y/o male presents with the above. PTA pt reports being mod independent with ADL and functional mobility. Today pt requiring minA for room level mobility using RW, up to minA for ADL. Pt with x1 posterior LOB with initial attempts to side step towards [facility] requiring minA for safe descent to EOB. VSS throughout on RA. Pt reports he lives with son who is able to provide 24hr supervision/assist PRN after discharge. Pt will benefit from continued acute OT services and currently recommend follow up HHOT services to maximize his overall safety and independence with ADL and mobility.     Follow Up Recommendations  Home health OT;Supervision/Assistance - 24 hour    Equipment Recommendations  3 in 1 bedside commode (for use in shower)           Precautions / Restrictions Precautions Precautions: Fall Restrictions Weight Bearing Restrictions: No      Mobility Bed Mobility Overal bed mobility: Needs Assistance Bed Mobility: Sit to Supine     Supine to sit: Min guard Sit to supine: Min guard   General bed mobility comments: pt using UEs to self assist LEs onto EOB  Transfers Overall transfer level: Needs assistance Equipment used: Rolling walker (2 wheeled) Transfers: Sit to/from Stand Sit to Stand: Min assist         General transfer comment: requiring assist to rise and stabilize at RW, VCs for safety, pt with x1 posterior LOB onto EOB when attempting to step towards [facility]    Balance Overall balance assessment: Needs assistance Sitting-balance support: No upper extremity  supported;Feet supported Sitting balance-Leahy Scale: Fair     Standing balance support: Bilateral upper extremity supported;During functional activity Standing balance-Leahy Scale: Poor Standing balance comment: relies on UE support, x1 posterior LOB with attempting to side step towards [facility] initially                            ADL either performed or assessed with clinical judgement   ADL Overall ADL's : Needs assistance/impaired Eating/Feeding: Modified independent;Sitting Eating/Feeding Details (indicate cue type and reason): pt finishing lunch upon arrival Grooming: Set up;Sitting   Upper Body Bathing: Supervision/ safety;Sitting   Lower Body Bathing: Minimal assistance;Sit to/from stand   Upper Body Dressing : Set up;Sitting   Lower Body Dressing: Minimal assistance;Sit to/from stand;Sitting/lateral leans   Toilet Transfer: Minimal assistance;Ambulation;RW Toilet Transfer Details (indicate cue type and reason): simulated via transfer to EOB Toileting- Clothing Manipulation and Hygiene: Minimal assistance;Sitting/lateral lean;Sit to/from stand       Functional mobility during ADLs: Minimal assistance;Rolling walker General ADL Comments: session limited as pt needing to go down for CT , pt able to mobilize from recliner around bed to the other side prior to transitioning to supine                         Pertinent Vitals/Pain Pain Assessment: Faces Faces Pain Scale: No hurt Pain Location: bil LES Pain Descriptors / Indicators: Discomfort Pain Intervention(s): Monitored during session     Hand  Dominance Right   Extremity/Trunk Assessment Upper Extremity Assessment Upper Extremity Assessment: Generalized weakness   Lower Extremity Assessment Lower Extremity Assessment: Defer to PT evaluation   Cervical / Trunk Assessment Cervical / Trunk Assessment: Kyphotic   Communication Communication Communication: No difficulties   Cognition  Arousal/Alertness: Awake/alert Behavior During Therapy: WFL for tasks assessed/performed Overall Cognitive Status: Within Functional Limits for tasks assessed                                 General Comments: eager to mobilize    General Comments  VSS on RA    Exercises     Shoulder Instructions      Home Living Family/patient expects to be discharged to:: Private residence Living Arrangements: Children Available Help at Discharge: Family;Available 24 hours/day Type of Home: House Home Access: Stairs to enter [employer] of Steps: 3 Entrance Stairs-Rails: None Home Layout: One level     Bathroom Shower/Tub: Chief Strategy Officer: Standard     Home Equipment: Cane - single point          Prior Functioning/Environment Level of Independence: Independent with assistive device(s)        Comments: doing well with cane PTa, bathes and dresses self.  Son present and stated that he needed help wtih walking as he was unbalanced at times        OT Problem List: Decreased strength;Decreased range of motion;Decreased activity tolerance;Impaired balance (sitting and/or standing);Decreased knowledge of use of DME or AE      OT Treatment/Interventions: Self-care/ADL training;Therapeutic exercise;Energy conservation;DME and/or AE instruction;Therapeutic activities;Patient/family education;Balance training    OT Goals(Current goals can be found in the care plan section) Acute Rehab OT Goals Patient Stated Goal: to go home OT Goal Formulation: With patient Time For Goal Achievement: 04/12/20 Potential to Achieve Goals: Good  OT Frequency: Min 2X/week   Barriers to D/C:            Co-evaluation              AM-PAC OT "6 Clicks" Daily Activity     Outcome Measure Help from another person eating meals?: None Help from another person taking care of personal grooming?: A Little Help from another person toileting, which includes  using toliet, bedpan, or urinal?: A Little Help from another person bathing (including washing, rinsing, drying)?: A Little Help from another person to put on and taking off regular upper body clothing?: A Little Help from another person to put on and taking off regular lower body clothing?: A Little 6 Click Score: 19   End of Session Equipment Utilized During Treatment: Rolling walker Nurse Communication: Mobility status  Activity Tolerance: Patient tolerated treatment well Patient left: in bed;with call bell/phone within reach;with bed alarm set  OT Visit Diagnosis: Muscle weakness (generalized) (M62.81);Unsteadiness on feet (R26.81)                Time: 6767-2094 OT Time Calculation (min): 12 min Charges:  OT General Charges $OT Visit: 1 Visit OT Evaluation $OT Eval Moderate Complexity: 1 Mod  Marcy Siren, OT Acute Rehabilitation Services Pager [phone] Office [phone]  Scott Rios 03/29/2020, 1:43 PM

## 2020-03-30 ENCOUNTER — Inpatient Hospital Stay (HOSPITAL_COMMUNITY): Payer: Medicare Other

## 2020-03-30 ENCOUNTER — Encounter (HOSPITAL_COMMUNITY)
Admission: EM | Disposition: A | Discharge status: Home / Self Care | Payer: Self-pay | Source: Home / Self Care | Attending: Surgery

## 2020-03-30 DIAGNOSIS — E1165 Type 2 diabetes mellitus with hyperglycemia: Secondary | ICD-10-CM

## 2020-03-30 DIAGNOSIS — R778 Other specified abnormalities of plasma proteins: Secondary | ICD-10-CM | POA: Diagnosis not present

## 2020-03-30 DIAGNOSIS — I5021 Acute systolic (congestive) heart failure: Secondary | ICD-10-CM | POA: Diagnosis not present

## 2020-03-30 DIAGNOSIS — R29898 Other symptoms and signs involving the musculoskeletal system: Secondary | ICD-10-CM | POA: Diagnosis not present

## 2020-03-30 DIAGNOSIS — I2510 Atherosclerotic heart disease of native coronary artery without angina pectoris: Secondary | ICD-10-CM | POA: Diagnosis not present

## 2020-03-30 DIAGNOSIS — I351 Nonrheumatic aortic (valve) insufficiency: Secondary | ICD-10-CM | POA: Diagnosis not present

## 2020-03-30 HISTORY — PX: RIGHT/LEFT HEART CATH AND CORONARY ANGIOGRAPHY: CATH118266

## 2020-03-30 LAB — CBC WITH DIFFERENTIAL/PLATELET
Abs Immature Granulocytes: 0.05 10*3/uL (ref 0.00–0.07)
Basophils Absolute: 0.1 10*3/uL (ref 0.0–0.1)
Basophils Relative: 1 %
Eosinophils Absolute: 0.3 10*3/uL (ref 0.0–0.5)
Eosinophils Relative: 3 %
HCT: 36.5 % — ABNORMAL LOW (ref 39.0–52.0)
Hemoglobin: 12.7 g/dL — ABNORMAL LOW (ref 13.0–17.0)
Immature Granulocytes: 0 %
Lymphocytes Relative: 20 %
Lymphs Abs: 2.4 10*3/uL (ref 0.7–4.0)
MCH: 29.9 pg (ref 26.0–34.0)
MCHC: 34.8 g/dL (ref 30.0–36.0)
MCV: 85.9 fL (ref 80.0–100.0)
Monocytes Absolute: 0.9 10*3/uL (ref 0.1–1.0)
Monocytes Relative: 8 %
Neutro Abs: 8.3 10*3/uL — ABNORMAL HIGH (ref 1.7–7.7)
Neutrophils Relative %: 68 %
Platelets: 356 10*3/uL (ref 150–400)
RBC: 4.25 MIL/uL (ref 4.22–5.81)
RDW: 13.5 % (ref 11.5–15.5)
WBC: 12 10*3/uL — ABNORMAL HIGH (ref 4.0–10.5)
nRBC: 0 % (ref 0.0–0.2)

## 2020-03-30 LAB — COMPREHENSIVE METABOLIC PANEL
ALT: 13 U/L (ref 0–44)
AST: 16 U/L (ref 15–41)
Albumin: 2.9 g/dL — ABNORMAL LOW (ref 3.5–5.0)
Alkaline Phosphatase: 74 U/L (ref 38–126)
Anion gap: 9 (ref 5–15)
BUN: 10 mg/dL (ref 8–23)
CO2: 25 mmol/L (ref 22–32)
Calcium: 8.6 mg/dL — ABNORMAL LOW (ref 8.9–10.3)
Chloride: 94 mmol/L — ABNORMAL LOW (ref 98–111)
Creatinine, Ser: 0.78 mg/dL (ref 0.61–1.24)
GFR calc Af Amer: >60 mL/min (ref >60)
GFR calc non Af Amer: >60 mL/min (ref >60)
Glucose, Bld: 264 mg/dL — ABNORMAL HIGH (ref 70–99)
Potassium: 4.4 mmol/L (ref 3.5–5.1)
Sodium: 128 mmol/L — ABNORMAL LOW (ref 135–145)
Total Bilirubin: 0.3 mg/dL (ref 0.3–1.2)
Total Protein: 5.6 g/dL — ABNORMAL LOW (ref 6.5–8.1)

## 2020-03-30 LAB — MAGNESIUM: Magnesium: 2 mg/dL (ref 1.7–2.4)

## 2020-03-30 LAB — POCT I-STAT EG7
Acid-Base Excess: 3 mmol/L — ABNORMAL HIGH (ref 0.0–2.0)
Bicarbonate: 28.9 mmol/L — ABNORMAL HIGH (ref 20.0–28.0)
Calcium, Ion: 1.21 mmol/L (ref 1.15–1.40)
HCT: 37 % — ABNORMAL LOW (ref 39.0–52.0)
Hemoglobin: 12.6 g/dL — ABNORMAL LOW (ref 13.0–17.0)
O2 Saturation: 57 %
Potassium: 4.1 mmol/L (ref 3.5–5.1)
Sodium: 132 mmol/L — ABNORMAL LOW (ref 135–145)
TCO2: 30 mmol/L (ref 22–32)
pCO2, Ven: 46.5 mmHg (ref 44.0–60.0)
pH, Ven: 7.402 (ref 7.250–7.430)
pO2, Ven: 30 mmHg — CL (ref 32.0–45.0)

## 2020-03-30 LAB — POCT I-STAT 7, (LYTES, BLD GAS, ICA,H+H)
Acid-Base Excess: 2 mmol/L (ref 0.0–2.0)
Bicarbonate: 26.8 mmol/L (ref 20.0–28.0)
Calcium, Ion: 1.13 mmol/L — ABNORMAL LOW (ref 1.15–1.40)
HCT: 35 % — ABNORMAL LOW (ref 39.0–52.0)
Hemoglobin: 11.9 g/dL — ABNORMAL LOW (ref 13.0–17.0)
O2 Saturation: 90 %
Potassium: 3.9 mmol/L (ref 3.5–5.1)
Sodium: 133 mmol/L — ABNORMAL LOW (ref 135–145)
TCO2: 28 mmol/L (ref 22–32)
pCO2 arterial: 40.7 mmHg (ref 32.0–48.0)
pH, Arterial: 7.427 (ref 7.350–7.450)
pO2, Arterial: 58 mmHg — ABNORMAL LOW (ref 83.0–108.0)

## 2020-03-30 LAB — PHOSPHORUS: Phosphorus: 5.4 mg/dL — ABNORMAL HIGH (ref 2.5–4.6)

## 2020-03-30 LAB — GLUCOSE, CAPILLARY
Glucose-Capillary: 228 mg/dL — ABNORMAL HIGH (ref 70–99)
Glucose-Capillary: 161 mg/dL — ABNORMAL HIGH (ref 70–99)
Glucose-Capillary: 131 mg/dL — ABNORMAL HIGH (ref 70–99)
Glucose-Capillary: 281 mg/dL — ABNORMAL HIGH (ref 70–99)

## 2020-03-30 SURGERY — RIGHT/LEFT HEART CATH AND CORONARY ANGIOGRAPHY
Anesthesia: LOCAL

## 2020-03-30 MED ORDER — SODIUM CHLORIDE 0.9 % IV SOLN: INTRAVENOUS | Status: AC

## 2020-03-30 MED ORDER — FENTANYL CITRATE (PF) 100 MCG/2ML IJ SOLN
INTRAMUSCULAR | Status: DC | PRN
  Administered 2020-03-30 (×2): 25 ug via INTRAVENOUS

## 2020-03-30 MED ORDER — VERAPAMIL HCL 2.5 MG/ML IV SOLN
INTRAVENOUS | Status: AC
  Filled 2020-03-30: qty 2

## 2020-03-30 MED ORDER — IOHEXOL 350 MG/ML SOLN
INTRAVENOUS | Status: DC | PRN
  Administered 2020-03-30: 80 mL

## 2020-03-30 MED ORDER — MIDAZOLAM HCL 2 MG/2ML IJ SOLN
INTRAMUSCULAR | Status: AC
  Filled 2020-03-30: qty 2

## 2020-03-30 MED ORDER — METOPROLOL TARTRATE 25 MG PO TABS
25.0000 mg | ORAL_TABLET | Freq: Two times a day (BID) | ORAL | Status: DC
  Administered 2020-03-30 – 2020-04-08 (×19): 25 mg via ORAL
  Filled 2020-03-30 (×19): qty 1

## 2020-03-30 MED ORDER — LIDOCAINE HCL (PF) 1 % IJ SOLN
INTRAMUSCULAR | Status: AC
  Filled 2020-03-30: qty 30

## 2020-03-30 MED ORDER — VERAPAMIL HCL 2.5 MG/ML IV SOLN
INTRAVENOUS | Status: DC | PRN
  Administered 2020-03-30: 10 mL via INTRA_ARTERIAL

## 2020-03-30 MED ORDER — FENTANYL CITRATE (PF) 100 MCG/2ML IJ SOLN
INTRAMUSCULAR | Status: AC
  Filled 2020-03-30: qty 2

## 2020-03-30 MED ORDER — SODIUM CHLORIDE 0.9 % IV SOLN: INTRAVENOUS | Status: DC

## 2020-03-30 MED ORDER — HEPARIN SODIUM (PORCINE) 1000 UNIT/ML IJ SOLN
INTRAMUSCULAR | Status: DC | PRN
  Administered 2020-03-30: 3500 [IU] via INTRAVENOUS

## 2020-03-30 MED ORDER — INSULIN GLARGINE 100 UNIT/ML ~~LOC~~ SOLN
12.0000 [IU] | Freq: Every day | SUBCUTANEOUS | Status: DC
  Filled 2020-03-30: qty 0.12

## 2020-03-30 MED ORDER — HEPARIN (PORCINE) IN NACL 1000-0.9 UT/500ML-% IV SOLN
INTRAVENOUS | Status: DC | PRN
  Administered 2020-03-30 (×2): 500 mL

## 2020-03-30 MED ORDER — LABETALOL HCL 5 MG/ML IV SOLN: 10.0000 mg | INTRAVENOUS | Status: AC | PRN

## 2020-03-30 MED ORDER — INSULIN GLARGINE 100 UNIT/ML ~~LOC~~ SOLN
12.0000 [IU] | Freq: Every day | SUBCUTANEOUS | Status: DC
  Administered 2020-03-30 – 2020-03-31 (×2): 12 [IU] via SUBCUTANEOUS
  Filled 2020-03-30 (×3): qty 0.12

## 2020-03-30 MED ORDER — HEPARIN (PORCINE) IN NACL 1000-0.9 UT/500ML-% IV SOLN
INTRAVENOUS | Status: AC
  Filled 2020-03-30: qty 1000

## 2020-03-30 MED ORDER — SODIUM CHLORIDE 0.9% FLUSH
3.0000 mL | Freq: Two times a day (BID) | INTRAVENOUS | Status: DC
  Administered 2020-03-31 – 2020-04-08 (×13): 3 mL via INTRAVENOUS

## 2020-03-30 MED ORDER — ACETAMINOPHEN 325 MG PO TABS
650.0000 mg | ORAL_TABLET | ORAL | Status: DC | PRN
  Administered 2020-04-03: 650 mg via ORAL
  Filled 2020-03-30: qty 2

## 2020-03-30 MED ORDER — SODIUM CHLORIDE 0.9 % IV SOLN: 250.0000 mL | INTRAVENOUS | Status: DC | PRN

## 2020-03-30 MED ORDER — LIDOCAINE HCL (PF) 1 % IJ SOLN
INTRAMUSCULAR | Status: DC | PRN
  Administered 2020-03-30: 2 mL

## 2020-03-30 MED ORDER — HYDRALAZINE HCL 20 MG/ML IJ SOLN: 10.0000 mg | INTRAMUSCULAR | Status: AC | PRN

## 2020-03-30 MED ORDER — MIDAZOLAM HCL 2 MG/2ML IJ SOLN
INTRAMUSCULAR | Status: DC | PRN
  Administered 2020-03-30: 1 mg via INTRAVENOUS
  Administered 2020-03-30: 2 mg via INTRAVENOUS

## 2020-03-30 MED ORDER — ONDANSETRON HCL 4 MG/2ML IJ SOLN: 4.0000 mg | Freq: Four times a day (QID) | INTRAMUSCULAR | Status: DC | PRN

## 2020-03-30 MED ORDER — SODIUM CHLORIDE 0.9% FLUSH: 3.0000 mL | INTRAVENOUS | Status: DC | PRN

## 2020-03-30 SURGICAL SUPPLY — 12 items
CATH 5FR JL3.5 JR4 ANG PIG MP (CATHETERS) ×1 IMPLANT
CATH BALLN WEDGE 5F 110CM (CATHETERS) ×1 IMPLANT
DEVICE RAD COMP TR BAND LRG (VASCULAR PRODUCTS) ×1 IMPLANT
GLIDESHEATH SLEND SS 6F .021 (SHEATH) ×1 IMPLANT
GUIDEWIRE INQWIRE 1.5J.035X260 (WIRE) IMPLANT
INQWIRE 1.5J .035X260CM (WIRE) ×2
KIT HEART LEFT (KITS) ×2 IMPLANT
PACK CARDIAC CATHETERIZATION (CUSTOM PROCEDURE TRAY) ×2 IMPLANT
SHEATH GLIDE SLENDER 4/5FR (SHEATH) ×1 IMPLANT
SYR MEDRAD MARK 7 150ML (SYRINGE) ×2 IMPLANT
TRANSDUCER W/STOPCOCK (MISCELLANEOUS) ×2 IMPLANT
TUBING CIL FLEX 10 FLL-RA (TUBING) ×2 IMPLANT

## 2020-03-30 NOTE — Progress Notes (Signed)
Progress Note  Patient Name: Scott Rios Date of Encounter: 03/30/2020  [facility] Cardiologist: No primary care provider on file. New--Dr. Mayford Knife (though lives in [place])  Subjective   States that he is scared, denies any chest pain, no significant shortness of breath, comfortable in bed  Inpatient Medications    Scheduled Meds: . aspirin EC  81 mg Oral Daily  . atorvastatin  80 mg Oral Daily  . Chlorhexidine Gluconate Cloth  6 each Topical Daily  . cyanocobalamin  1,000 mcg Intramuscular Daily  . feeding supplement (GLUCERNA SHAKE)  237 mL Oral TID BM  . furosemide  40 mg Intravenous BID  . heparin injection (subcutaneous)  5,000 Units Subcutaneous Q8H  . insulin aspart  0-5 Units Subcutaneous QHS  . insulin aspart  0-9 Units Subcutaneous TID WC  . losartan  25 mg Oral Daily  . metoprolol tartrate  12.5 mg Oral Q6H  . multivitamin with minerals  1 tablet Oral Daily  . nicotine  21 mg Transdermal Daily  . polyethylene glycol  17 g Oral BID  . senna-docusate  1 tablet Oral BID  . spironolactone  12.5 mg Oral Daily   Continuous Infusions: . sodium chloride 10 mL/hr at 03/30/20 0609  . thiamine injection 250 mg (03/30/20 0220)   PRN Meds: acetaminophen   Vital Signs    Vitals:   03/30/20 0048 03/30/20 0400 03/30/20 0609 03/30/20 0828  BP: (!) 109/53 110/79  (!) 113/53  Pulse: 79 71 73 69  Resp: Temp: 98.1 F (36.7 C) 98.7 F (37.1 C)  98.1 F (36.7 C)  TempSrc: Oral Oral  Oral  SpO2: 91% 93%  96%  Weight:  67.6 kg    Height:        Intake/Output Summary (Last 24 hours) at 03/30/2020 0857 Last data filed at 03/30/2020 0829 Gross per 24 hour  Intake 294.6 ml  Output 1900 ml  Net -1605.4 ml   Last 3 Weights 03/30/2020 03/29/2020 03/28/2020  Weight (lbs) 149 lb 1.6 oz 147 lb 8.9 oz 148 lb 2.4 oz  Weight (kg) 67.631 kg 66.93 kg 67.2 kg      Telemetry    Occasional first-degree AV block no adverse arrhythmias- Personally Reviewed  ECG     No new- Personally Reviewed  Physical Exam   GEN: No acute distress.   Neck: No JVD Cardiac: RRR, soft systolic murmur, difficult to appreciate diastolic murmur Respiratory: Clear to auscultation bilaterally. GI: Soft, nontender, non-distended  MS: No edema; No deformity. Neuro:  Nonfocal  Psych: Normal affect   Labs    High Sensitivity Troponin:   Recent Labs  Lab 03/27/20 2317 03/28/20 0324 03/28/20 0819 03/28/20 1006 03/29/20 0941  TROPONINIHS 27* 121* 1,479* 1,747* 629*      Chemistry Recent Labs  Lab 03/28/20 0324 03/29/20 0941 03/30/20 0309  NA 129* 130* 128*  K 4.0 4.2 4.4  CL 96* 96* 94*  CO2 GLUCOSE 286* 221* 264*  BUN 9 7* 10  CREATININE 0.52* 0.57* 0.78  CALCIUM 8.7* 8.9 8.6*  PROT 6.0* 5.5* 5.6*  ALBUMIN 3.1* 2.9* 2.9*  AST 12* 14* 16  ALT ALKPHOS 62 57 74  BILITOT 0.4 0.7 0.3  GFRNONAA >60 >60 >60  GFRAA >60 >60 >60  ANIONGAP Hematology Recent Labs  Lab 03/28/20 0324 03/29/20 0941 03/30/20 0309  WBC 13.2* 13.7* 12.0*  RBC 4.57 4.36  4.25  HGB 13.5 12.9* 12.7*  HCT 39.5 36.9* 36.5*  MCV 86.4 84.6 85.9  MCH 29.5 29.6 29.9  MCHC 34.2 35.0 34.8  RDW 13.3 13.2 13.5  PLT 350 342 356    BNPNo results for input(s): BNP, PROBNP in the last 168 hours.   DDimer  Recent Labs  Lab 03/28/20 2216  DDIMER 0.84*     Radiology    MR Brain W and Wo Contrast  Result Date: 03/28/2020 CLINICAL DATA:  Neuro deficit EXAM: MRI HEAD WITHOUT AND WITH CONTRAST TECHNIQUE: Multiplanar, multiecho pulse sequences of the brain and surrounding structures were obtained without and with intravenous contrast. CONTRAST:  6.66mL GADAVIST GADOBUTROL 1 MMOL/ML IV SOLN COMPARISON:  03/28/2020 head CT and prior. FINDINGS: Please note that motion artifact limits evaluation. Brain: Scattered T2 hyperintense foci involving the periventricular/subcortical white matter and pons are nonspecific however commonly associated with chronic  microvascular ischemic changes. No acute infarct or intracranial hemorrhage. No midline shift, ventriculomegaly or extra-axial fluid collection. No mass lesion. No abnormal enhancement. Vascular: Normal flow voids. Skull and upper cervical spine: Normal marrow signal. Sinuses/Orbits: Normal orbits. Clear paranasal sinuses. Trace bilateral mastoid effusion. Other: None. IMPRESSION: No acute intracranial process. Mild chronic microvascular ischemic changes. Electronically Signed   By: Stana Bunting M.D.   On: 03/28/2020 17:39   MR Lumbar Spine W Wo Contrast  Result Date: 03/28/2020 CLINICAL DATA:  Low back pain EXAM: MRI LUMBAR SPINE WITHOUT AND WITH CONTRAST TECHNIQUE: Multiplanar and multiecho pulse sequences of the lumbar spine were obtained without and with intravenous contrast. CONTRAST:  6.37mL GADAVIST GADOBUTROL 1 MMOL/ML IV SOLN COMPARISON:  03/28/2020 CT L-spine. FINDINGS: Please note that motion artifact limits evaluation. Segmentation:  Standard. Alignment:  Straightening of lordosis.  No listhesis. Vertebrae: Normal bone marrow signal intensity. 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 Chronic superior L4 endplate fracture deformity with approximately 30% height loss. No significant retropulsion. No acute fracture. Conus medullaris and cauda equina: Conus extends to the L1 level. Conus and cauda equina appear normal. Disc levels: Multilevel desiccation, endplate osteophytosis and mild disc space loss. 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 L1-2: Disc bulge with superimposed right foraminal protrusion and bilateral facet hypertrophy. Patent spinal canal and bilateral neural foramen. L2-3: Disc bulge abutting the bilateral descending L3 nerve roots and bilateral facet hypertrophy. Patent spinal canal. Mild left and moderate right neural foraminal narrowing. L3-4: Disc bulge abutting the exiting L3 and descending L4 nerve roots. Superimposed left foraminal/extraforaminal protrusion.  Ligamentum flavum and bilateral facet hypertrophy. Mild spinal canal and bilateral neural foraminal narrowing. L4-5: Disc bulge with superimposed bilateral extraforaminal protrusions. There is also a superimposed central protrusion. Ligamentum flavum and bilateral facet hypertrophy. Moderate to severe spinal canal narrowing. Moderate right and mild left neural foraminal narrowing. L5-S1: Disc bulge abutting the exiting bilateral L5 nerve roots with superimposed central protrusion. Bilateral facet hypertrophy. There is also abutment of the descending bilateral S1 nerve roots. Patent spinal canal and bilateral neural foramina. Paraspinal and other soft tissues: Paraspinal soft tissue within normal limits. 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 No acute osseous abnormality. Chronic L4 compression fracture deformity with mild height loss. Moderate to severe spinal canal narrowing at the L4-5 level. Moderate right L2-3, right L4-5 neural foraminal narrowing. L3-4 disc bulge abutment of the exiting L3, descending L4 nerve roots. L5-S1 disc bulge abutment of the exiting L5 and descending S1 nerve roots. Electronically Signed   By: Stana Bunting M.D.   On: 03/28/2020 17:58  DG CHEST PORT 1 VIEW  Result Date: 03/30/2020 CLINICAL DATA:  Short of breath EXAM: PORTABLE CHEST 1 VIEW COMPARISON:  CT 03/29/2020 FINDINGS: Normal cardiac silhouette. There is bilateral lower lobe lower lobe atelectasis and effusion similar to CT 1 day prior. No focal consolidation. No pneumothorax. IMPRESSION: 1. No interval change. 2. Bibasilar atelectasis and effusions. Electronically Signed   By: Genevive BiStewart  Edmunds M.D.   On: 03/30/2020 06:12   DG CHEST PORT 1 VIEW  Result Date: 03/29/2020 CLINICAL DATA:  Hypoxia EXAM: PORTABLE CHEST 1 VIEW COMPARISON:  03/28/2020 FINDINGS: Cardiac shadow is stable. The lungs are well aerated bilaterally. Skin fold is noted over the left base laterally. Diffuse  interstitial edema is again identified and stable. No focal infiltrate is seen. IMPRESSION: Stable interstitial edema. Electronically Signed   By: Alcide CleverMark  Lukens M.D.   On: 03/29/2020 07:20   CT ANGIO CHEST AORTA W/CM & OR WO/CM  Result Date: 03/29/2020 CLINICAL DATA:  Exclude aortic dissection.  Preop AVR planning. EXAM: CT ANGIOGRAPHY CHEST WITH CONTRAST TECHNIQUE: Multidetector CT imaging of the chest was performed using the standard protocol during bolus administration of intravenous contrast. Multiplanar CT image reconstructions and MIPs were obtained to evaluate the vascular anatomy. CONTRAST:  75mL OMNIPAQUE IOHEXOL 350 MG/ML SOLN COMPARISON:  None. FINDINGS: Cardiovascular: Heart is normal size. There is a small amount of pericardial fluid present. Moderate calcified plaque over the 3 vessel coronary arteries. Mild ectasia of the ascending thoracic aorta measuring 3.7 cm in AP diameter. Aortic root measures 4.1 cm and sinotubular junction 3 cm. No evidence of dissection. Mild calcified plaque over the thoracic aorta. Mild focal noncalcified plaque along the lateral aspect of the arch just distal to the takeoff of the left subclavian artery. Normal 3 vessel takeoff from the aortic arch. Noncalcified plaque over the origin of the left subclavian artery. Remaining vascular structures are unremarkable. Mediastinum/Nodes: 1.1 cm right paratracheal lymph node likely reactive. No significant hilar adenopathy. Remaining mediastinal structures are normal. Lungs/Pleura: Moderate size bilateral pleural effusions with associated bibasilar atelectasis. Patchy areas of ground-glass opacification over the mid to upper lungs which may be due to infection. 3 mm and 4 mm nodules mild debris along the distal tracheal wall of uncertain clinical significance. Remaining airways are unremarkable. Over the right lower lobe. Upper Abdomen: Previous cholecystectomy. Calcified and noncalcified plaque over the abdominal aorta. No  acute findings. Musculoskeletal: Mild degenerative change of the spine. Review of the MIP images confirms the above findings. IMPRESSION: 1. No evidence of aortic dissection. Mild ectasia of the ascending thoracic aorta measuring 3.7 cm in AP diameter. 2. Moderate size bilateral pleural effusions with associated bibasilar atelectasis. Patchy ground-glass opacification over the mid to upper lungs which may be due to infection or inflammatory process. 1.1 cm right paratracheal lymph node likely reactive. 3. Aortic atherosclerosis. Atherosclerotic coronary artery disease. Aortic Atherosclerosis (ICD10-I70.0). Electronically Signed   By: Elberta Fortisaniel  Boyle M.D.   On: 03/29/2020 15:26   ECHOCARDIOGRAM COMPLETE  Result Date: 03/28/2020    ECHOCARDIOGRAM REPORT   Patient Name:   Adron BeneHOMAS E Novakovich Date of Exam: 03/28/2020 Medical Rec #:  [id number]      Height:       67.0 in Accession #:    [id number]     Weight:       170.0 lb Date of Birth:  09/02/1955      BSA:          1.887 m Patient Age:    [age] years  BP:           128/68 mmHg Patient Gender: M              HR:           89 bpm. Exam Location:  Inpatient Procedure: 2D Echo STAT ECHO Indications:    elevated troponin  History:        Patient has no prior history of Echocardiogram examinations.                 Signs/Symptoms:elevated troponin; Risk Factors:Hypertension,                 Diabetes and Current Smoker.  Sonographer:    Delcie Roch RDCS Referring Phys: [id number] OLADAPO ADEFESO IMPRESSIONS  1. Left ventricular ejection fraction, by estimation, is 35 to 40%. The left ventricle has moderately decreased function. The left ventricle demonstrates regional wall motion abnormalities (see scoring diagram/findings for description). The left ventricular internal cavity size was mildly dilated. There is mild concentric left ventricular hypertrophy. Left ventricular diastolic parameters are consistent with Grade II diastolic dysfunction (pseudonormalization). There is  moderate hypokinesis of the left ventricular, mid-apical inferoseptal wall and inferior wall. There is severe hypokinesis of the left ventricular, mid-apical anteroseptal wall.  2. Right ventricular systolic function is normal. The right ventricular size is normal.  3. Left atrial size was severely dilated.  4. Right atrial size was mildly dilated.  5. Likely moderate eccentric MR.. The mitral valve is abnormal. Moderate mitral valve regurgitation.  6. Fixed right coronary cusp with prominent focal calcification. Suspect bicuspid valve, but obscured by significant calcification. Severe aortic regurgitation, best seen on apical images, with mitral valve anterior leaflet restriction secondary to jet.  Vena contracta 0.65 cm on 5 chamber apical, 0.80 cm on 3 chamber apical, fills most of LVOT and projects into majority of LV cavity (see image 83). Some diastolic flow reversal seen in descending aorta but not well visualized.. The aortic valve has an indeterminant number of cusps. Aortic valve regurgitation is severe. Mild aortic valve stenosis.  7. Aortic dilatation noted. There is borderline dilatation of the ascending aorta measuring 38 mm.  8. The inferior vena cava is dilated in size with <50% respiratory variability, suggesting right atrial pressure of 15 mmHg. Comparison(s): No prior Echocardiogram. Conclusion(s)/Recommendation(s): Abnormal study. Reduced EF with both global and focal hypokinesis; mild global hypokinesis, with moderate wall motion abnormalities in the mid to apical inferoseptal and inferior wall, with severe hypokinesis in the mid to apical anteroseptal walls. Also with severe aortic regurgitation, calcified and likely bicuspid aortic valve with mild stenosis. FINDINGS  Left Ventricle: Left ventricular ejection fraction, by estimation, is 35 to 40%. The left ventricle has moderately decreased function. The left ventricle demonstrates regional wall motion abnormalities. Moderate hypokinesis of the  left ventricular, mid-apical inferoseptal wall and inferior wall. Severe hypokinesis of the left ventricular, mid-apical anteroseptal wall. The left ventricular internal cavity size was mildly dilated. There is mild concentric left ventricular hypertrophy. Left ventricular diastolic parameters are consistent with Grade II diastolic dysfunction (pseudonormalization).  LV Wall Scoring: The mid anteroseptal segment is akinetic. The mid and distal inferior wall, basal anteroseptal segment, mid inferoseptal segment, apical septal segment, and apex are hypokinetic. The entire anterior wall, entire lateral wall, basal inferior segment, and basal inferoseptal segment are normal. Right Ventricle: The right ventricular size is normal. No increase in right ventricular wall thickness. Right ventricular systolic function is normal. Left Atrium: Left atrial size was severely dilated. Right  Atrium: Right atrial size was mildly dilated. Pericardium: There is no evidence of pericardial effusion. Mitral Valve: Likely moderate eccentric MR. The mitral valve is abnormal. Moderate mitral annular calcification. Moderate mitral valve regurgitation. Tricuspid Valve: The tricuspid valve is normal in structure. Tricuspid valve regurgitation is trivial. No evidence of tricuspid stenosis. Aortic Valve: Fixed right coronary cusp with prominent focal calcification. Suspect bicuspid valve, but obscured by significant calcification. Severe aortic regurgitation, best seen on apical images, with mitral valve anterior leaflet restriction secondary to jet. Vena contracta 0.65 cm on 5 chamber apical, 0.80 cm on 3 chamber apical, fills most of LVOT and projects into majority of LV cavity (see image 83). Some diastolic flow reversal seen in descending aorta but not well visualized. The aortic valve has an indeterminant number of cusps. . There is moderate thickening and moderate calcification of the aortic valve. Aortic valve regurgitation is severe.  Aortic regurgitation PHT measures 266 msec. Mild aortic stenosis is present. There is moderate thickening of the aortic valve. There is moderate calcification of the aortic valve. Aortic valve mean gradient measures 12.0 mmHg. Aortic valve peak gradient measures 22.3 mmHg. Aortic valve area, by VTI measures 1.76 cm. Pulmonic Valve: The pulmonic valve was not well visualized. Pulmonic valve regurgitation is not visualized. Aorta: Aortic dilatation noted. There is borderline dilatation of the ascending aorta measuring 38 mm. Venous: The inferior vena cava is dilated in size with less than 50% respiratory variability, suggesting right atrial pressure of 15 mmHg. IAS/Shunts: No atrial level shunt detected by color flow Doppler.  LEFT VENTRICLE PLAX 2D LVIDd:         5.54 cm      Diastology LVIDs:         4.40 cm      LV e' lateral:   5.00 cm/s LV PW:         1.23 cm      LV E/e' lateral: 21.4 LV IVS:        1.18 cm LVOT diam:     2.10 cm LV SV:         70 LV SV Index:   37 LVOT Area:     3.46 cm  LV Volumes (MOD) LV vol d, MOD A2C: 117.0 ml LV vol d, MOD A4C: 106.0 ml LV vol s, MOD A2C: 75.0 ml LV vol s, MOD A4C: 83.4 ml LV SV MOD A2C:     42.0 ml LV SV MOD A4C:     106.0 ml LV SV MOD BP:      32.1 ml RIGHT VENTRICLE RV S prime:     13.20 cm/s TAPSE (M-mode): 2.7 cm LEFT ATRIUM             Index       RIGHT ATRIUM           Index LA diam:        4.30 cm 2.28 cm/m  RA Area:     14.80 cm LA Vol (A2C):   57.4 ml 30.41 ml/m RA Volume:   35.30 ml  18.70 ml/m LA Vol (A4C):   81.8 ml 43.34 ml/m LA Biplane Vol: 68.7 ml 36.40 ml/m  AORTIC VALVE AV Area (Vmax):    1.67 cm AV Area (Vmean):   1.74 cm AV Area (VTI):     1.76 cm AV Vmax:           236.00 cm/s AV Vmean:          158.500 cm/s AV VTI:  0.401 m AV Peak Grad:      22.3 mmHg AV Mean Grad:      12.0 mmHg LVOT Vmax:         113.50 cm/s LVOT Vmean:        79.550 cm/s LVOT VTI:          0.204 m LVOT/AV VTI ratio: 0.51 AI PHT:            266 msec  AORTA Ao  Root diam: 3.60 cm Ao Asc diam:  3.80 cm MITRAL VALVE MV Area (PHT): 6.12 cm     SHUNTS MV Decel Time: 124 msec     Systemic VTI:  0.20 m MR Peak grad:   101.6 mmHg  Systemic Diam: 2.10 cm MR Mean grad:   63.0 mmHg MR Vmax:        504.00 cm/s MR Vmean:       376.0 cm/s MR PISA:        1.01 cm MR PISA Radius: 0.40 cm MV E velocity: 107.00 cm/s MV A velocity: 69.40 cm/s MV E/A ratio:  1.54 Jodelle Red MD Electronically signed by Jodelle Red MD Signature Date/Time: 03/28/2020/12:53:56 PM    Final    VAS [place] LOWER EXTREMITY VENOUS (DVT)  Result Date: 03/29/2020  Lower Venous DVTStudy Indications: Swelling.  Comparison Study: No prior study Performing Technologist: Gertie Fey MHA, RDMS, RVT, RDCS  Examination Guidelines: A complete evaluation includes B-mode imaging, spectral Doppler, color Doppler, and power Doppler as needed of all accessible portions of each vessel. Bilateral testing is considered an integral part of a complete examination. Limited examinations for reoccurring indications may be performed as noted. The reflux portion of the exam is performed with the patient in reverse Trendelenburg.  +-----+---------------+---------+-----------+----------+--------------+ RIGHTCompressibilityPhasicitySpontaneityPropertiesThrombus Aging +-----+---------------+---------+-----------+----------+--------------+ CFV  Full           No       Yes                                 +-----+---------------+---------+-----------+----------+--------------+   +---------+---------------+---------+-----------+----------+--------------+ LEFT     CompressibilityPhasicitySpontaneityPropertiesThrombus Aging +---------+---------------+---------+-----------+----------+--------------+ CFV      Full           No       Yes                                 +---------+---------------+---------+-----------+----------+--------------+ SFJ      Full                                                         +---------+---------------+---------+-----------+----------+--------------+ FV Prox  Full                                                        +---------+---------------+---------+-----------+----------+--------------+ FV Mid   Full                                                        +---------+---------------+---------+-----------+----------+--------------+  FV DistalFull                                                        +---------+---------------+---------+-----------+----------+--------------+ PFV      Full                                                        +---------+---------------+---------+-----------+----------+--------------+ POP      Full           No       Yes                                 +---------+---------------+---------+-----------+----------+--------------+ PTV      Full                                                        +---------+---------------+---------+-----------+----------+--------------+ PERO     Full                                                        +---------+---------------+---------+-----------+----------+--------------+     Summary: RIGHT: - No evidence of common femoral vein obstruction.  LEFT: - There is no evidence of deep vein thrombosis in the lower extremity.  - No cystic structure found in the popliteal fossa. Lower extremity venous flow is pulsatile, suggestive of possibly elevated right heart pressure.  *See table(s) above for measurements and observations. Electronically signed by Fabienne Bruns MD on 03/29/2020 at 12:28:51 PM.    Final     Cardiac Studies   2D echo 03/28/2020 1. Left ventricular ejection fraction, by estimation, is 35 to 40%. The  left ventricle has moderately decreased function. The left ventricle  demonstrates regional wall motion abnormalities (see scoring  diagram/findings for description). The left  ventricular internal cavity size was mildly dilated.  There is mild  concentric left ventricular hypertrophy. Left ventricular diastolic  parameters are consistent with Grade II diastolic dysfunction  (pseudonormalization). There is moderate hypokinesis of  the left ventricular, mid-apical inferoseptal wall and inferior wall.  There is severe hypokinesis of the left ventricular, mid-apical  anteroseptal wall.  2. Right ventricular systolic function is normal. The right ventricular  size is normal.  3. Left atrial size was severely dilated.  4. Right atrial size was mildly dilated.  5. Likely moderate eccentric MR.. The mitral valve is abnormal. Moderate  mitral valve regurgitation.  6. Fixed right coronary cusp with prominent focal calcification. Suspect  bicuspid valve, but obscured by significant calcification. Severe aortic  regurgitation, best seen on apical images, with mitral valve anterior  leaflet restriction secondary to jet.  Vena contracta 0.65 cm on 5 chamber apical, 0.80 cm on 3 chamber apical,  fills most of LVOT and projects into majority of LV cavity (see image 83).  Some diastolic flow reversal seen in descending aorta but not well  visualized.. The aortic valve has an  indeterminant number of cusps. Aortic valve regurgitation is severe. Mild  aortic valve stenosis.  7. Aortic dilatation noted. There is borderline dilatation of the  ascending aorta measuring 38 mm.  8. The inferior vena cava is dilated in size with <50% respiratory  variability, suggesting right atrial pressure of 15 mmHg.   Patient Profile     63 y.o. male with a hx of DM and HTN, ETOH abuse and tobacco abusewho is being seen in consultation for the evaluation of acute pulmonary edema, LV dysfunction, moderate MR and severe ARat the request of Marguerita Merles, MD.  Assessment & Plan    Acute systolic heart failure with acute respiratory failure/pulmonary edema -Right and left heart catheterization today Currently on Lasix 40 mg IV twice  daily -Started during this hospitalization on losartan 25 and spironolactone 12.5 a day. -Currently on metoprolol 12.5 every 6 hours, I will go ahead and start to consolidate with 25 twice daily.  Transition to metoprolol succinate on discharge.  Elevated troponin -Troponin increased from 27 up to 1700 down to 629.  This rise and fall is compatible with significant demand ischemia in the setting of underlying valvular issues as well as acute systolic heart failure. -Right and left heart catheterization.  Question the possibility of underlying triple-vessel disease given the degree of troponin elevation.  Severe aortic regurgitation -Possible bicuspid aortic valve, right coronary cusp is fixed with prominent focal calcification associated with severe aortic insufficiency. -CT scan of chest did not show any evidence of dissection.  Aortic dimensions are minimally dilated/upper normal 3.7 cm. -Getting heart cath today, we will try to set up for TEE tomorrow.  Moderate mitral regurgitation -Anterior mitral valve leaflet restriction due to severe aortic insufficiency jet -TEE possibly tomorrow to assess mitral regurgitation.  Tobacco use -Continue to encourage cessation.  Type 2 diabetes with cardiomyopathy -Hemoglobin A1c 10.2, statin, ARB, consider SGLT2 inhibitor. Per main team  Hyponatremia -Possibly from underlying cardiomyopathy.  Improved with diuresis.       For questions or updates, please contact [facility] Please consult [email or web address] for contact info under        Signed, Donato Schultz, MD  03/30/2020, 8:57 AM

## 2020-03-30 NOTE — TOC Initial Note (Signed)
Transition of Care [facility]) - Initial/Assessment Note    Patient Details  Name: Scott Rios MRN: [id number] Date of Birth: Feb 21, 1956  [facility] CM/SW Contact:    Durenda Guthrie, RN Phone Number: [phone] 03/30/2020, 11:21 AM  Clinical Narrative: Patient is a  64 y.o. male with a hx of DM and HTN, ETOH abuse (quit 10 yrs ago) and tobacco abusewho is being seen in consultation for the evaluation of acute pulmonary edema, LV dysfunction, moderate MR and severe AR. Patient c/o weakness in left leg. Case manager spoke with patient's son, [name] via telephone to discuss Home Health needs, [name] and his dad live together and he is available to assist his dad.   Choice of Home Health agencies offered, Referral called to Lorenza Chick, [facility] liaison. DME has bee requested through [employer] and will be delivered to patient's room. TOC Team will continue to monitor.               Expected Discharge Plan: Home w Home Health Services Barriers to Discharge: Continued Medical Work up   Patient Goals and CMS Choice Patient states their goals for this hospitalization and ongoing recovery are:: per son: get betetr CMS Medicare.gov Compare Post Acute Care list provided to:: Patient Represenative (must comment) (son: [name]) Choice offered to / list presented to : Adult Children  Expected Discharge Plan and Services Expected Discharge Plan: Home w Home Health Services   Discharge Planning Services: CM Consult Post Acute Care Choice: Durable Medical Equipment, Home Health Living arrangements for the past 2 months: Single Family Home                 DME Arranged: 3-N-1, Walker rolling DME Agency: [employer] Date DME Agency Contacted: 03/30/20 Time DME Agency Contacted: 1115 Representative spoke with at DME Agency: Oletha Cruel HH Arranged: PT, OT HH Agency: [facility] Date [facility] Agency Contacted: 03/30/20 Time HH Agency Contacted: [phone] Representative spoke with at [facility]  Agency: Lorenza Chick  Prior Living Arrangements/Services Living arrangements for the past 2 months: Single Family Home Lives with:: Adult Children Patient language and need for interpreter reviewed:: Yes Do you feel safe going back to the place where you live?: Yes      Need for Family Participation in Patient Care: Yes (Comment) Care giver support system in place?: Yes (comment)   Criminal Activity/Legal Involvement Pertinent to Current Situation/Hospitalization: No - Comment as needed  Activities of Daily Living Home Assistive Devices/Equipment: Cane (specify quad or straight) ADL Screening (condition at time of admission) Patient's cognitive ability adequate to safely complete daily activities?: No Is the patient deaf or have difficulty hearing?: No Does the patient have difficulty seeing, even when wearing glasses/contacts?: No Does the patient have difficulty concentrating, remembering, or making decisions?: No Patient able to express need for assistance with ADLs?: Yes Does the patient have difficulty dressing or bathing?: No Independently performs ADLs?: Yes (appropriate for developmental age) Does the patient have difficulty walking or climbing stairs?: No Weakness of Legs: Both Weakness of Arms/Hands: None  Permission Sought/Granted   Permission granted to share information with : Yes, Verbal Permission Granted     Permission granted to share info w AGENCY: [facility]        Emotional Assessment       Orientation: : Oriented to Self, Oriented to Place, Oriented to  Time, Oriented to Situation Alcohol / Substance Use: Not Applicable Psych Involvement: No (comment)  Admission diagnosis:  Hyponatremia [  E87.1] Acute pulmonary edema (HCC) [J81.0] Elevated troponin [R77.8] Right leg weakness [R29.898] Weakness of right lower extremity [R29.898] Patient Active Problem List   Diagnosis Date Noted  . Right leg weakness 03/28/2020  . Flash pulmonary edema  (HCC) 03/28/2020  . Hypomagnesemia 03/28/2020  . Hypoalbuminemia 03/28/2020  . Hyperglycemia 03/28/2020  . Hyponatremia 03/28/2020  . Elevated troponin 03/28/2020  . Pulmonary nodule 03/28/2020  . Acute respiratory failure with hypoxia (HCC) 03/28/2020  . Tobacco abuse 03/28/2020  . Acute pulmonary edema (HCC)   . Weakness of right lower extremity   . Acute systolic heart failure (HCC)   . Nonrheumatic mitral valve regurgitation   . Nonrheumatic aortic valve insufficiency    PCP:  Elfredia Nevins, MD Pharmacy:  No Pharmacies Listed    Social Determinants of Health (SDOH) Interventions    Readmission Risk Interventions No flowsheet data found.

## 2020-03-30 NOTE — Progress Notes (Signed)
    He will have transesophageal echocardiogram performed tomorrow 9:00 with Dr. Cristal Deer to evaluate aortic and mitral valve.  Donato Schultz, MD

## 2020-03-30 NOTE — Interval H&P Note (Signed)
Cath Lab Visit (complete for each Cath Lab visit)  Clinical Evaluation Leading to the Procedure:   ACS: Yes.    Non-ACS:    Anginal Classification: CCS IV  Anti-ischemic medical therapy: Minimal Therapy (1 class of medications)  Non-Invasive Test Results: No non-invasive testing performed  Prior CABG: No previous CABG      History and Physical Interval Note:  03/30/2020 2:44 PM  Scott Rios  has presented today for surgery, with the diagnosis of NSTEMI.  The various methods of treatment have been discussed with the patient and family. After consideration of risks, benefits and other options for treatment, the patient has consented to  Procedure(s): RIGHT/LEFT HEART CATH AND CORONARY ANGIOGRAPHY (N/A) as a surgical intervention.  The patient's history has been reviewed, patient examined, no change in status, stable for surgery.  I have reviewed the patient's chart and labs.  Questions were answered to the patient's satisfaction.     Lance Muss

## 2020-03-30 NOTE — Progress Notes (Signed)
Inpatient Diabetes Program Recommendations  AACE/ADA: New Consensus Statement on Inpatient Glycemic Control (2015)  Target Ranges:  Prepandial:   less than 140 mg/dL      Peak postprandial:   less than 180 mg/dL (1-2 hours)      Critically ill patients:  140 - 180 mg/dL   Lab Results  Component Value Date   GLUCAP 228 (H) 03/30/2020   HGBA1C 10.2 (H) 03/28/2020    Review of Glycemic Control Results for Scott Rios, Scott Rios (MRN [id number]) as of 03/30/2020 11:47  Ref. Range 03/29/2020 12:10 03/29/2020 16:14 03/29/2020 21:35 03/30/2020 08:25  Glucose-Capillary Latest Ref Range: 70 - 99 mg/dL 389 (H) 373 (H) 428 (H) 228 (H)   Diabetes history: DM2 Outpatient Diabetes medications: None Current orders for Inpatient glycemic control: Novolog sensitive 0-9 units tid + hs 0-5 units  Inpatient Diabetes Program Recommendations:   Noted patient has hx diabetes but no medications @ home. -Start Lantus 12 units daily (0.2 units/kg x 67.6 kg = 13.5 units) Secure chat to Dr. Marguerita Merles.  Thank you, Billy Fischer. Kinsly Hild, RN, MSN, CDE  Diabetes Coordinator Inpatient Glycemic Control Team Team Pager [phone] (8am-5pm) 03/30/2020 11:52 AM

## 2020-03-30 NOTE — Progress Notes (Signed)
PROGRESS NOTE    Scott Rios  JKK:[id number] DOB: [birth year] DOA: 03/27/2020 PCP: Elfredia Nevins, MD   Brief Narrative:  HPI per Dr. Frankey Shown on 03/28/20 Scott Rios is a 64 y.o. male with medical history significant for hypertension and diabetes not on home medication, tobacco abuse and prior history of alcohol abuse (quit 10 years ago) who presents to the emergency department due to 8-month history of right leg weakness.  Patient states that the right leg sometimes go out on him.  He complained of difficulty in being able to stand after using the toilet last evening and he ended up crawling into the living room.  Right leg was numb and weak and with more difficulty in being able to raise the leg.  He complained of chronic constipation with last bowel movement pain 8 days ago, however there was no complaint of back pain, bowel or bladder changes.  Right hand was only significant for occasional tingling sensation.  He denies fever, chills, chest pain, shortness of breath or having any sick contact.  ED Course:  In the emergency department, he was hemodynamically stable.  Work-up in the ED showed mild leukocytosis, hyponatremia, hyperglycemia, magnesium 1.4, troponin x 2 27> 121, hypoalbuminemia.  SARS coronavirus was negative.  Initial chest x-ray showed borderline cardiomegaly with diffuse interstitial thickening suspicious for pulmonary edema, possible pulmonary nodule was also noted in the right midlung.  Patient suddenly became acutely hypoxic, tachypneic and gasping for breath when he returned from CT.  IV Lasix was given, IV fluids were stopped and patient was placed on BiPAP, repeat a chest x-ray was consistent with mild diffuse pulmonary interstitial edema.  CT of head showed no acute acute abnormality.  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   IV thiamine 500 mg x 1 was given.  Hospitalist was asked to admit patient for further evaluation and management.  **Interim History  Patient's shortness of breath is improved with the Lasix and his leg weakness is improving as well and he was able to walk with PT.  States that he has more mobility in his leg today than he did yesterday.  Cardiology following and recommending a CTA of the chest to rule out dissection and planning on doing a cardiac catheterization later today and a TEE in the AM. Will add Lantus for his Uncontrolled DM Type 2 with Hyperglycemia.   Assessment & Plan:   Principal Problem:   Right leg weakness Active Problems:   Flash pulmonary edema (HCC)   Hypomagnesemia   Hypoalbuminemia   Hyperglycemia   Hyponatremia   Elevated troponin   Pulmonary nodule   Acute respiratory failure with hypoxia (HCC)   Tobacco abuse   Acute pulmonary edema (HCC)   Weakness of right lower extremity   Acute systolic heart failure (HCC)   Nonrheumatic mitral valve regurgitation   Nonrheumatic aortic valve insufficiency  Right leg weakness rule out structural lesion in brain/lumbar spine, thiamine deficiency, CIDP, improving  -CT of head showed no acute acute abnormality.   -CT lumbar spine without contrast showed no acute abnormality within the lumbar spine but did show multifactorial degenerative changes at L4-5 with resultant severe spinal stenosis. -Continue IV thiamine 500 mg 3 times a day for 2 consecutive days, then -Continue IV thiamine 250mg  daily for 5 days (combination with other B vitamins) and then continue Thiamine well p.o. daily Nonemergent  MRI brain and lumbar spine with and without contrast will be done per teleneurologist recommendation. 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 No acute osseous abnormality. Chronic L4 compression fracture deformity with  mild height loss. Moderate to severe spinal canal narrowing at the L4-5 level. Moderate right L2-3, right L4-5 neural foraminal narrowing. L3-4 disc bulge abutment of the exiting L3, descending L4 nerve roots. L5-S1 disc bulge abutment of the exiting L5 and descending S1 nerve roots." -Neurology Dr. Iver Nestle evaluated the case and feels that this is a chronic issue and that there is no acute process. -PT/OT to evaluate and Treat recommending Home Health as he was able to walk with them  Acute Respiratory failure with hypoxia requiring NIPPV secondary to flash pulmonary edema in the setting of Combined Acute Systolic and Diastolic CHF, improved -Patient went into acute respiratory distress on arrival from CT -Initial CXR showed "Cardiomegaly with mild diffuse pulmonary interstitial edema.  Suspected underlying emphysema.  Aortic Atherosclerosis." -Repeat CXR showed diffuse interstitial edema  -D-Dimer was 0.84 -Initially  was placed on BiPAP, IV Lasix 40 Mg x1 was given with subsequent improvement; Continuing IV Lasix 40 mg q12h -Echocardiogram checked and he has a new Cardiomyopathy -SpO2: 93 % O2 Flow Rate (L/min): 2 L/min FiO2 (%): 45 % ; Was OFF of Supplemental O2 today  -Cardiology will be consulted and recommended CTA Chest Aorta to r/o Dissection -CTA showed "No evidence of aortic dissection. Mild ectasia of the ascending thoracic aorta measuring 3.7 cm in AP diameter. Moderate size bilateral pleural effusions with associated bibasilar atelectasis. Patchy ground-glass opacification over the mid to upper lungs which may be due to infection or inflammatory process. 1.1 cm right paratracheal lymph node likely reactive.  Aortic atherosclerosis. Atherosclerotic coronary artery disease.." -C/w Diuresis with IV Lasix 40 mg BID per Cardiology Reccs -ECHO showed EF of 35-40% and Grade 2 DD -Also has Significant AR and MR -Repeat CXR in the AM -Cardiology recommending Right and Left Heart  Cath -Cardiology has started Losartan 25 mg po Daily and Spironolactone 12.5 mg po Daily   -Cardiology had also started scheduled Metoprolol and have consolidated it today to 25 mg po BID  Elevated Troponin possibly secondary to flash pulmonary edema with associated bilateral pleural effusions  -Troponin 27 -> 121 -> 1479 -> 1747 -> 629 -EKG shows sinus rhythm at rate of 90 bpm with no significant change from prior EKG done on arrival. -Patient does not complain of any chest pain but did have shortness of breath and this could be an anginal equivalent given his uncontrolled diabetes mellitus type 2 -This may be due to type II demand ischemia vs NSTEMI -Continue to trend troponin -Cardiology consulted for further evaluation recommendations and feel that his elevation could be due to acute pulmonary edema in the setting of undiagnosed dilated cardiomyopathy but also could be due to CAD and they are recommending him undergoing a left and right heart cath but wanted to rule out aortic dissection first and CTA of the chest aorta showed no evidence of any dissection -Cardiology started on atorvastatin 80 mg p.o. daily  Dilated cardiomyopathy -He has an EF of 35 to 40% with wall motion abnormalities as well so we are left atrial enlargement -TSH has been normal -Cardiology starting low-dose ARB with losartan 25 mg p.o. daily as well as spironolactone 12.5 mg daily -Further care per cardiology; Cardiology also  started the patient on Metoprolol Tartrate 12.5 mg po q6h and have consolidated it to 25 mg po BID   Elevated D-Dimer -CTA of the chest as above -Checking lower extremity venous duplex and showed no evidence of DVT.  Severe aortic regurgitation -Echocardiogram done -Cardiology recommending TEE for further evaluation and will be done 03/31/20  Mitral regurgitation  -Cardiology recommending TEE to assess his mitral regurgitation and feel that this mitral regurgitation is likely exacerbated by  his cardiomyopathy; TEE to be done 03/31/20 at 9:00 AM   Hypomagnesemia -Mg 1.4 on admission and repeat today 2.0 -Continue to Monitor and Replete as Necessary -Repeat Magnesium in the AM  Hypoalbuminemia possibly secondary to mild protein calorie malnutrition -Albumin was 2.9; -Protein supplement will be provided -Dietitian will be consulted for further evaluation and recommendations  Uncontrolled Diabetes Mellitus Type 2 with Hyperglycemia -Blood glucose level was 344, hemoglobin A1c was 10.2 -Continue with sensitive NovoLog sliding cell insulin before meals and at bedtime  -Continue to monitor blood sugars carefully; CBGs ranging from 161-278 and had a blood sugar of 264 on CMP today. -Will start him on Lantus 12 units daily -Diabetes Education Coordidnator consulted for further evaluation and recommendations -Cardiology started him on atorvastatin 80 mg daily  Incidental pulmonary nodule/paratracheal lymph node -Chest x-ray showed possible pulmonary nodule in the right mid lung -CT chest per recommendation will be considered when patient is more stable, considering that he developed flash pulmonary edema when he had CT of lumbar spine -CTA of the chest evaluating for aortic dissection showed patchy groundglass opacification of the mid to upper lungs which may be due to infection inflammatory process and there is a 1.1 cm right paratracheal lymph node which was likely reactive -Continue to monitor and follow in outpatient setting  Low Back Pain -CT lumbar spine without contrast showed no acute abnormality within the lumbar spine, but it showed:  Multifactorial degenerative changes at L4-5 with resultant severe spinal stenosis.  Left foraminal to extraforaminal disc protrusion at L3-4, potentially affecting the exiting left L3 nerve root. Chronic L4 compression fracture with mild 30% height loss and trace 2 mm bony retropulsion. -Continue Tylenol as needed for back  pain -Continue PT/OT eval and treat. -MRI of the lumbar spine as above  Tobacco Abuse -Patient counseled on tobacco abuse cessation and he acknowledged understanding discussion -Continue nicotine patch 21 mg TD   Hyponatremia -In the setting of hypervolemia as well as hyperglycemia -Continue with diuresis and control blood sugars -Sodium was slowly improving and has gone from 129 -> 130 -> 128 -Continue monitor and trend and repeat CMP in a.m.  Leukocytosis -Likely reactive -WBC went from 13.2 -> 13.7 -> 12.0 -Continue to Monitor and Trend -Repeat CBC in the AM  Normocytic Anemia -Patient's Hgb/Hct went from 13.5/39.5 -> 12.9/36.9 -> 12.7/36.5 -Check Anemia Panel in the AM -Continue to Monitor for S/Sx of Bleeding; Currently no overt bleeding noted -Repeat CBC in AM   DVT prophylaxis: Will add Heparin 5,000 units sq q8h Code Status: FULL CODE  Family Communication: Discussed with Son at bedside  Disposition Plan: Pending further cardiac clearance and evaluation and likely will be home with home health PT OT is recommended this. He is going for a Cardiac Cath today   Status is: Inpatient  Remains inpatient appropriate because:Ongoing diagnostic testing needed not appropriate for outpatient work up, Unsafe d/c plan, IV treatments appropriate due to intensity of illness or inability to take PO and Inpatient level of care appropriate due  to severity of illness   Dispo: The patient is from: Home              Anticipated d/c is to: Home              Anticipated d/c date is: 2 days              Patient currently is not medically stable to d/c.  Consultants:   Neurology  Cardiology   Procedures:  ECHOCARDIOGRAM IMPRESSIONS    1. Left ventricular ejection fraction, by estimation, is 35 to 40%. The  left ventricle has moderately decreased function. The left ventricle  demonstrates regional wall motion abnormalities (see scoring  diagram/findings for description). The  left  ventricular internal cavity size was mildly dilated. There is mild  concentric left ventricular hypertrophy. Left ventricular diastolic  parameters are consistent with Grade II diastolic dysfunction  (pseudonormalization). There is moderate hypokinesis of  the left ventricular, mid-apical inferoseptal wall and inferior wall.  There is severe hypokinesis of the left ventricular, mid-apical  anteroseptal wall.  2. Right ventricular systolic function is normal. The right ventricular  size is normal.  3. Left atrial size was severely dilated.  4. Right atrial size was mildly dilated.  5. Likely moderate eccentric MR.. The mitral valve is abnormal. Moderate  mitral valve regurgitation.  6. Fixed right coronary cusp with prominent focal calcification. Suspect  bicuspid valve, but obscured by significant calcification. Severe aortic  regurgitation, best seen on apical images, with mitral valve anterior  leaflet restriction secondary to jet.  Vena contracta 0.65 cm on 5 chamber apical, 0.80 cm on 3 chamber apical,  fills most of LVOT and projects into majority of LV cavity (see image 83).  Some diastolic flow reversal seen in descending aorta but not well  visualized.. The aortic valve has an  indeterminant number of cusps. Aortic valve regurgitation is severe. Mild  aortic valve stenosis.  7. Aortic dilatation noted. There is borderline dilatation of the  ascending aorta measuring 38 mm.  8. The inferior vena cava is dilated in size with <50% respiratory  variability, suggesting right atrial pressure of 15 mmHg.   Comparison(s): No prior Echocardiogram.   Conclusion(s)/Recommendation(s): Abnormal study. Reduced EF with both  global and focal hypokinesis; mild global hypokinesis, with moderate wall  motion abnormalities in the mid to apical inferoseptal and inferior wall,  with severe hypokinesis in the mid  to apical anteroseptal walls. Also with severe aortic regurgitation,   calcified and likely bicuspid aortic valve with mild stenosis.   FINDINGS  Left Ventricle: Left ventricular ejection fraction, by estimation, is 35  to 40%. The left ventricle has moderately decreased function. The left  ventricle demonstrates regional wall motion abnormalities. Moderate  hypokinesis of the left ventricular,  mid-apical inferoseptal wall and inferior wall. Severe hypokinesis of the  left ventricular, mid-apical anteroseptal wall. The left ventricular  internal cavity size was mildly dilated. There is mild concentric left  ventricular hypertrophy. Left  ventricular diastolic parameters are consistent with Grade II diastolic  dysfunction (pseudonormalization).     LV Wall Scoring:  The mid anteroseptal segment is akinetic. The mid and distal inferior  wall,  basal anteroseptal segment, mid inferoseptal segment, apical septal  segment,  and apex are hypokinetic. The entire anterior wall, entire lateral wall,  basal inferior segment, and basal inferoseptal segment are normal.   Right Ventricle: The right ventricular size is normal. No increase in  right ventricular wall thickness. Right ventricular systolic function  is  normal.   Left Atrium: Left atrial size was severely dilated.   Right Atrium: Right atrial size was mildly dilated.   Pericardium: There is no evidence of pericardial effusion.   Mitral Valve: Likely moderate eccentric MR. The mitral valve is abnormal.  Moderate mitral annular calcification. Moderate mitral valve  regurgitation.   Tricuspid Valve: The tricuspid valve is normal in structure. Tricuspid  valve regurgitation is trivial. No evidence of tricuspid stenosis.   Aortic Valve: Fixed right coronary cusp with prominent focal  calcification. Suspect bicuspid valve, but obscured by significant  calcification. Severe aortic regurgitation, best seen on apical images,  with mitral valve anterior leaflet restriction  secondary to jet. Vena  contracta 0.65 cm on 5 chamber apical, 0.80 cm on 3  chamber apical, fills most of LVOT and projects into majority of LV cavity  (see image 83). Some diastolic flow reversal seen in descending aorta but  not well visualized. The  aortic valve has an indeterminant number of cusps. . There is moderate  thickening and moderate calcification of the aortic valve. Aortic valve  regurgitation is severe. Aortic regurgitation PHT measures 266 msec. Mild  aortic stenosis is present. There is  moderate thickening of the aortic valve. There is moderate calcification  of the aortic valve. Aortic valve mean gradient measures 12.0 mmHg. Aortic  valve peak gradient measures 22.3 mmHg. Aortic valve area, by VTI measures  1.76 cm.   Pulmonic Valve: The pulmonic valve was not well visualized. Pulmonic valve  regurgitation is not visualized.   Aorta: Aortic dilatation noted. There is borderline dilatation of the  ascending aorta measuring 38 mm.   Venous: The inferior vena cava is dilated in size with less than 50%  respiratory variability, suggesting right atrial pressure of 15 mmHg.   IAS/Shunts: No atrial level shunt detected by color flow Doppler.     LEFT VENTRICLE  PLAX 2D  LVIDd:     5.54 cm   Diastology  LVIDs:     4.40 cm   LV e' lateral:  5.00 cm/s  LV PW:     1.23 cm   LV E/e' lateral: 21.4  LV IVS:    1.18 cm  LVOT diam:   2.10 cm  LV SV:     70  LV SV Index:  37  LVOT Area:   3.46 cm    LV Volumes (MOD)  LV vol d, MOD A2C: 117.0 ml  LV vol d, MOD A4C: 106.0 ml  LV vol s, MOD A2C: 75.0 ml  LV vol s, MOD A4C: 83.4 ml  LV SV MOD A2C:   42.0 ml  LV SV MOD A4C:   106.0 ml  LV SV MOD BP:   32.1 ml   RIGHT VENTRICLE  RV S prime:   13.20 cm/s  TAPSE (M-mode): 2.7 cm   LEFT ATRIUM       Index    RIGHT ATRIUM      Index  LA diam:    4.30 cm 2.28 cm/m RA Area:   14.80 cm  LA Vol (A2C):  57.4 ml 30.41 ml/m RA  Volume:  35.30 ml 18.70 ml/m  LA Vol (A4C):  81.8 ml 43.34 ml/m  LA Biplane Vol: 68.7 ml 36.40 ml/m  AORTIC VALVE  AV Area (Vmax):  1.67 cm  AV Area (Vmean):  1.74 cm  AV Area (VTI):   1.76 cm  AV Vmax:      236.00 cm/s  AV Vmean:  158.500 cm/s  AV VTI:      0.401 m  AV Peak Grad:   22.3 mmHg  AV Mean Grad:   12.0 mmHg  LVOT Vmax:     113.50 cm/s  LVOT Vmean:    79.550 cm/s  LVOT VTI:     0.204 m  LVOT/AV VTI ratio: 0.51  AI PHT:      266 msec    AORTA  Ao Root diam: 3.60 cm  Ao Asc diam: 3.80 cm   MITRAL VALVE  MV Area (PHT): 6.12 cm   SHUNTS  MV Decel Time: 124 msec   Systemic VTI: 0.20 m  MR Peak grad:  101.6 mmHg Systemic Diam: 2.10 cm  MR Mean grad:  63.0 mmHg  MR Vmax:    504.00 cm/s  MR Vmean:    376.0 cm/s  MR PISA:    1.01 cm  MR PISA Radius: 0.40 cm  MV E velocity: 107.00 cm/s  MV A velocity: 69.40 cm/s  MV E/A ratio: 1.54   Cardiac Cath to be Done today   Antimicrobials:  Anti-infectives (From admission, onward)   None     Subjective: Seen and examined at bedside and patient was weaned off of oxygen and was little anxious and states that he did not really sleep last night because of his anxiety.  No nausea or vomiting.  Denies any shortness of breath today and feels that his leg is doing much better today.  No other concerns or complaints at this time.  Objective: Vitals:   03/30/20 0400 03/30/20 0609 03/30/20 0828 03/30/20 1210  BP: 110/79  (!) 113/53 124/66  Pulse: 71 73 69 75  Resp: 20  18 18   Temp: 98.7 F (37.1 C)  98.1 F (36.7 C) 97.8 F (36.6 C)  TempSrc: Oral  Oral Oral  SpO2: 93%  96% 93%  Weight: 67.6 kg     Height:        Intake/Output Summary (Last 24 hours) at 03/30/2020 1247 Last data filed at 03/30/2020 1210 Gross per 24 hour  Intake 294.6 ml  Output 2300 ml  Net -2005.4 ml   Filed Weights   03/28/20 1503 03/29/20 0439 03/30/20 0400  Weight:  67.2 kg 66.9 kg 67.6 kg   Examination: Physical Exam:  Constitutional: Thin chronically ill-appearing Caucasian male in NAD and appears anxious Eyes: Lids and conjunctivae normal, sclerae anicteric  ENMT: External Ears, Nose appear normal. Grossly normal hearing.  Neck: Appears normal, supple, no cervical masses, normal ROM, no appreciable thyromegaly; no JVD Respiratory: Diminished to auscultation bilaterally with coarse breath sounds and some crackles, no wheezing, rales, rhonchi or crackles. Normal respiratory effort and patient is not tachypenic. No accessory muscle use. Unlabored breathing  Cardiovascular: RRR, no murmurs / rubs / gallops. Had 1+ LE edema Abdomen: Soft, non-tender, non-distended. Bowel sounds positive.  GU: Deferred. Musculoskeletal: No clubbing / cyanosis of digits/nails. No joint deformity upper and lower extremities.  Skin: No rashes, lesions, ulcers on a limited skin evaluation. No induration; Warm and dry.  Neurologic: CN 2-12 grossly intact with no focal deficits. Romberg sign and cerebellar reflexes not assessed.  Psychiatric: Normal judgment and insight. Alert and oriented x 3. Anxious mood and appropriate affect.   Data Reviewed: I have personally reviewed following labs and imaging studies  CBC: Recent Labs  Lab 03/27/20 2317 03/28/20 0324 03/29/20 0941 03/30/20 0309  WBC 10.6* 13.2* 13.7* 12.0*  NEUTROABS 7.5  --  10.7* 8.3*  HGB 12.9* 13.5 12.9* 12.7*  HCT 36.8* 39.5 36.9* 36.5*  MCV 85.6 86.4 84.6 85.9  PLT 319 350 342 356   Basic Metabolic Panel: Recent Labs  Lab 03/27/20 2317 03/28/20 0324 03/29/20 0941 03/30/20 0309  NA 126* 129* 130* 128*  K 3.6 4.0 4.2 4.4  CL 93* 96* 96* 94*  CO2 22 22 26 25   GLUCOSE 344* 286* 221* 264*  BUN 8 9 7* 10  CREATININE 0.64 0.52* 0.57* 0.78  CALCIUM 8.5* 8.7* 8.9 8.6*  MG 1.4*  --  1.6* 2.0  PHOS  --   --  3.6 5.4*   GFR: Estimated Creatinine Clearance: 86.1 mL/min (by C-G formula based on SCr of  0.78 mg/dL). Liver Function Tests: Recent Labs  Lab 03/27/20 2317 03/28/20 0324 03/29/20 0941 03/30/20 0309  AST 12* 12* 14* 16  ALT 9 10 11 13   ALKPHOS 59 62 57 74  BILITOT 0.3 0.4 0.7 0.3  PROT 5.7* 6.0* 5.5* 5.6*  ALBUMIN 3.1* 3.1* 2.9* 2.9*   Recent Labs  Lab 03/27/20 2317  LIPASE 17   No results for input(s): AMMONIA in the last 168 hours. Coagulation Profile: Recent Labs  Lab 03/28/20 0324  INR 1.1   Cardiac Enzymes: No results for input(s): CKTOTAL, CKMB, CKMBINDEX, TROPONINI in the last 168 hours. BNP (last 3 results) No results for input(s): PROBNP in the last 8760 hours. HbA1C: Recent Labs    03/28/20 0324  HGBA1C 10.2*   CBG: Recent Labs  Lab 03/29/20 1210 03/29/20 1614 03/29/20 2135 03/30/20 0825  GLUCAP 278* 165* 264* 228*   Lipid Profile: No results for input(s): CHOL, HDL, LDLCALC, TRIG, CHOLHDL, LDLDIRECT in the last 72 hours. Thyroid Function Tests: Recent Labs    03/29/20 0941  TSH 2.465   Anemia Panel: Recent Labs    03/28/20 0811 03/28/20 1221  VITAMINB12 122*  --   FOLATE  --  6.7   Sepsis Labs: No results for input(s): PROCALCITON, LATICACIDVEN in the last 168 hours.  Recent Results (from the past 240 hour(s))  SARS Coronavirus 2 by RT PCR (hospital order, performed in [facility] hospital lab) Nasopharyngeal Nasopharyngeal Swab     Status: None   Collection Time: 03/28/20  2:20 AM   Specimen: Nasopharyngeal Swab  Result Value Ref Range Status   SARS Coronavirus 2 NEGATIVE NEGATIVE Final    Comment: (NOTE) SARS-CoV-2 target nucleic acids are NOT DETECTED.  The SARS-CoV-2 RNA is generally detectable in upper and lower respiratory specimens during the acute phase of infection. The lowest concentration of SARS-CoV-2 viral copies this assay can detect is 250 copies / mL. A negative result does not preclude SARS-CoV-2 infection and should not be used as the sole basis for treatment or other patient management decisions.   A negative result may occur with improper specimen collection / handling, submission of specimen other than nasopharyngeal swab, presence of viral mutation(s) within the areas targeted by this assay, and inadequate number of viral copies (<250 copies / mL). A negative result must be combined with clinical observations, patient history, and epidemiological information.  Fact Sheet for Patients:   BoilerBrush.com.cy  Fact Sheet for Healthcare Providers: https://pope.com/  This test is not yet approved or  cleared by the [place] FDA and has been authorized for detection and/or diagnosis of SARS-CoV-2 by FDA under an Emergency Use Authorization (EUA).  This EUA will remain in effect (meaning this test can be used) for the duration of the COVID-19 declaration under Section 564(b)(1) of the Act, 21 U.S.C. section  360bbb-3(b)(1), unless the authorization is terminated or revoked sooner.  Performed at [facility], [address] [place], [place] [address]      RN Pressure Injury Documentation:     Estimated body mass index is 23.35 kg/m as calculated from the following:   Height as of this encounter: 5\' 7"  (1.702 m).   Weight as of this encounter: 67.6 kg.  Malnutrition Type:      Malnutrition Characteristics:      Nutrition Interventions:    Radiology Studies: MR Brain W and Wo Contrast  Result Date: 03/28/2020 CLINICAL DATA:  Neuro deficit EXAM: MRI HEAD WITHOUT AND WITH CONTRAST TECHNIQUE: Multiplanar, multiecho pulse sequences of the brain and surrounding structures were obtained without and with intravenous contrast. CONTRAST:  6.69mL GADAVIST GADOBUTROL 1 MMOL/ML IV SOLN COMPARISON:  03/28/2020 head CT and prior. FINDINGS: Please note that motion artifact limits evaluation. Brain: Scattered T2 hyperintense foci involving the periventricular/subcortical white matter and pons are nonspecific however commonly associated  with chronic microvascular ischemic changes. No acute infarct or intracranial hemorrhage. No midline shift, ventriculomegaly or extra-axial fluid collection. No mass lesion. No abnormal enhancement. Vascular: Normal flow voids. Skull and upper cervical spine: Normal marrow signal. Sinuses/Orbits: Normal orbits. Clear paranasal sinuses. Trace bilateral mastoid effusion. Other: None. IMPRESSION: No acute intracranial process. Mild chronic microvascular ischemic changes. Electronically Signed   By: 03/30/2020 M.D.   On: 03/28/2020 17:39   MR Lumbar Spine W Wo Contrast  Result Date: 03/28/2020 CLINICAL DATA:  Low back pain EXAM: MRI LUMBAR SPINE WITHOUT AND WITH CONTRAST TECHNIQUE: Multiplanar and multiecho pulse sequences of the lumbar spine were obtained without and with intravenous contrast. CONTRAST:  6.50mL GADAVIST GADOBUTROL 1 MMOL/ML IV SOLN COMPARISON:  03/28/2020 CT L-spine. FINDINGS: Please note that motion artifact limits evaluation. Segmentation:  Standard. Alignment:  Straightening of lordosis.  No listhesis. Vertebrae: Normal bone marrow signal intensity. 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 Chronic superior L4 endplate fracture deformity with approximately 30% height loss. No significant retropulsion. No acute fracture. Conus medullaris and cauda equina: Conus extends to the L1 level. Conus and cauda equina appear normal. Disc levels: Multilevel desiccation, endplate osteophytosis and mild disc space loss. 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 L1-2: Disc bulge with superimposed right foraminal protrusion and bilateral facet hypertrophy. Patent spinal canal and bilateral neural foramen. L2-3: Disc bulge abutting the bilateral descending L3 nerve roots and bilateral facet hypertrophy. Patent spinal canal. Mild left and moderate right neural foraminal narrowing. L3-4: Disc bulge abutting the exiting L3 and descending L4 nerve roots. Superimposed left foraminal/extraforaminal  protrusion. Ligamentum flavum and bilateral facet hypertrophy. Mild spinal canal and bilateral neural foraminal narrowing. L4-5: Disc bulge with superimposed bilateral extraforaminal protrusions. There is also a superimposed central protrusion. Ligamentum flavum and bilateral facet hypertrophy. Moderate to severe spinal canal narrowing. Moderate right and mild left neural foraminal narrowing. L5-S1: Disc bulge abutting the exiting bilateral L5 nerve roots with superimposed central protrusion. Bilateral facet hypertrophy. There is also abutment of the descending bilateral S1 nerve roots. Patent spinal canal and bilateral neural foramina. Paraspinal and other soft tissues: Paraspinal soft tissue within normal limits. 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 No acute osseous abnormality. Chronic L4 compression fracture deformity with mild height loss. Moderate to severe spinal canal narrowing at the L4-5 level. Moderate right L2-3, right L4-5 neural foraminal narrowing. L3-4 disc bulge abutment of the exiting L3, descending L4 nerve roots. L5-S1 disc bulge abutment of the exiting L5 and descending  S1 nerve roots. Electronically Signed   By: Stana Bunting M.D.   On: 03/28/2020 17:58   DG CHEST PORT 1 VIEW  Result Date: 03/30/2020 CLINICAL DATA:  Short of breath EXAM: PORTABLE CHEST 1 VIEW COMPARISON:  CT 03/29/2020 FINDINGS: Normal cardiac silhouette. There is bilateral lower lobe lower lobe atelectasis and effusion similar to CT 1 day prior. No focal consolidation. No pneumothorax. IMPRESSION: 1. No interval change. 2. Bibasilar atelectasis and effusions. Electronically Signed   By: Genevive Bi M.D.   On: 03/30/2020 06:12   DG CHEST PORT 1 VIEW  Result Date: 03/29/2020 CLINICAL DATA:  Hypoxia EXAM: PORTABLE CHEST 1 VIEW COMPARISON:  03/28/2020 FINDINGS: Cardiac shadow is stable. The lungs are well aerated bilaterally. Skin fold is noted over the left base  laterally. Diffuse interstitial edema is again identified and stable. No focal infiltrate is seen. IMPRESSION: Stable interstitial edema. Electronically Signed   By: Alcide Clever M.D.   On: 03/29/2020 07:20   CT ANGIO CHEST AORTA W/CM & OR WO/CM  Result Date: 03/29/2020 CLINICAL DATA:  Exclude aortic dissection.  Preop AVR planning. EXAM: CT ANGIOGRAPHY CHEST WITH CONTRAST TECHNIQUE: Multidetector CT imaging of the chest was performed using the standard protocol during bolus administration of intravenous contrast. Multiplanar CT image reconstructions and MIPs were obtained to evaluate the vascular anatomy. CONTRAST:  75mL OMNIPAQUE IOHEXOL 350 MG/ML SOLN COMPARISON:  None. FINDINGS: Cardiovascular: Heart is normal size. There is a small amount of pericardial fluid present. Moderate calcified plaque over the 3 vessel coronary arteries. Mild ectasia of the ascending thoracic aorta measuring 3.7 cm in AP diameter. Aortic root measures 4.1 cm and sinotubular junction 3 cm. No evidence of dissection. Mild calcified plaque over the thoracic aorta. Mild focal noncalcified plaque along the lateral aspect of the arch just distal to the takeoff of the left subclavian artery. Normal 3 vessel takeoff from the aortic arch. Noncalcified plaque over the origin of the left subclavian artery. Remaining vascular structures are unremarkable. Mediastinum/Nodes: 1.1 cm right paratracheal lymph node likely reactive. No significant hilar adenopathy. Remaining mediastinal structures are normal. Lungs/Pleura: Moderate size bilateral pleural effusions with associated bibasilar atelectasis. Patchy areas of ground-glass opacification over the mid to upper lungs which may be due to infection. 3 mm and 4 mm nodules mild debris along the distal tracheal wall of uncertain clinical significance. Remaining airways are unremarkable. Over the right lower lobe. Upper Abdomen: Previous cholecystectomy. Calcified and noncalcified plaque over the  abdominal aorta. No acute findings. Musculoskeletal: Mild degenerative change of the spine. Review of the MIP images confirms the above findings. IMPRESSION: 1. No evidence of aortic dissection. Mild ectasia of the ascending thoracic aorta measuring 3.7 cm in AP diameter. 2. Moderate size bilateral pleural effusions with associated bibasilar atelectasis. Patchy ground-glass opacification over the mid to upper lungs which may be due to infection or inflammatory process. 1.1 cm right paratracheal lymph node likely reactive. 3. Aortic atherosclerosis. Atherosclerotic coronary artery disease. Aortic Atherosclerosis (ICD10-I70.0). Electronically Signed   By: Elberta Fortis M.D.   On: 03/29/2020 15:26   VAS [place] LOWER EXTREMITY VENOUS (DVT)  Result Date: 03/29/2020  Lower Venous DVTStudy Indications: Swelling.  Comparison Study: No prior study Performing Technologist: Gertie Fey MHA, RDMS, RVT, RDCS  Examination Guidelines: A complete evaluation includes B-mode imaging, spectral Doppler, color Doppler, and power Doppler as needed of all accessible portions of each vessel. Bilateral testing is considered an integral part of a complete examination. Limited examinations for reoccurring indications may be  performed as noted. The reflux portion of the exam is performed with the patient in reverse Trendelenburg.  +-----+---------------+---------+-----------+----------+--------------+ RIGHTCompressibilityPhasicitySpontaneityPropertiesThrombus Aging +-----+---------------+---------+-----------+----------+--------------+ CFV  Full           No       Yes                                 +-----+---------------+---------+-----------+----------+--------------+   +---------+---------------+---------+-----------+----------+--------------+ LEFT     CompressibilityPhasicitySpontaneityPropertiesThrombus Aging +---------+---------------+---------+-----------+----------+--------------+ CFV      Full            No       Yes                                 +---------+---------------+---------+-----------+----------+--------------+ SFJ      Full                                                        +---------+---------------+---------+-----------+----------+--------------+ FV Prox  Full                                                        +---------+---------------+---------+-----------+----------+--------------+ FV Mid   Full                                                        +---------+---------------+---------+-----------+----------+--------------+ FV DistalFull                                                        +---------+---------------+---------+-----------+----------+--------------+ PFV      Full                                                        +---------+---------------+---------+-----------+----------+--------------+ POP      Full           No       Yes                                 +---------+---------------+---------+-----------+----------+--------------+ PTV      Full                                                        +---------+---------------+---------+-----------+----------+--------------+ PERO     Full                                                        +---------+---------------+---------+-----------+----------+--------------+  Summary: RIGHT: - No evidence of common femoral vein obstruction.  LEFT: - There is no evidence of deep vein thrombosis in the lower extremity.  - No cystic structure found in the popliteal fossa. Lower extremity venous flow is pulsatile, suggestive of possibly elevated right heart pressure.  *See table(s) above for measurements and observations. Electronically signed by Fabienne Bruns MD on 03/29/2020 at 12:28:51 PM.    Final    Scheduled Meds: . aspirin EC  81 mg Oral Daily  . atorvastatin  80 mg Oral Daily  . Chlorhexidine Gluconate Cloth  6 each Topical Daily  . cyanocobalamin  1,000 mcg  Intramuscular Daily  . feeding supplement (GLUCERNA SHAKE)  237 mL Oral TID BM  . furosemide  40 mg Intravenous BID  . heparin injection (subcutaneous)  5,000 Units Subcutaneous Q8H  . insulin aspart  0-5 Units Subcutaneous QHS  . insulin aspart  0-9 Units Subcutaneous TID WC  . insulin glargine  12 Units Subcutaneous Daily  . losartan  25 mg Oral Daily  . metoprolol tartrate  25 mg Oral BID  . multivitamin with minerals  1 tablet Oral Daily  . nicotine  21 mg Transdermal Daily  . polyethylene glycol  17 g Oral BID  . senna-docusate  1 tablet Oral BID  . spironolactone  12.5 mg Oral Daily   Continuous Infusions: . sodium chloride 10 mL/hr at 03/30/20 0609  . thiamine injection 250 mg (03/30/20 0220)    LOS: 2 days   Merlene Laughter, DO [facility] PAGER is on AMION  If 7PM-7AM, please contact night-coverage [email or web address]

## 2020-03-30 NOTE — H&P (View-Only) (Signed)
Progress Note  Patient Name: Scott Rios Date of Encounter: 03/30/2020  [facility] Cardiologist: No primary care provider on file. New--Dr. Mayford Knife (though lives in [place])  Subjective   States that he is scared, denies any chest pain, no significant shortness of breath, comfortable in bed  Inpatient Medications    Scheduled Meds: . aspirin EC  81 mg Oral Daily  . atorvastatin  80 mg Oral Daily  . Chlorhexidine Gluconate Cloth  6 each Topical Daily  . cyanocobalamin  1,000 mcg Intramuscular Daily  . feeding supplement (GLUCERNA SHAKE)  237 mL Oral TID BM  . furosemide  40 mg Intravenous BID  . heparin injection (subcutaneous)  5,000 Units Subcutaneous Q8H  . insulin aspart  0-5 Units Subcutaneous QHS  . insulin aspart  0-9 Units Subcutaneous TID WC  . losartan  25 mg Oral Daily  . metoprolol tartrate  12.5 mg Oral Q6H  . multivitamin with minerals  1 tablet Oral Daily  . nicotine  21 mg Transdermal Daily  . polyethylene glycol  17 g Oral BID  . senna-docusate  1 tablet Oral BID  . spironolactone  12.5 mg Oral Daily   Continuous Infusions: . sodium chloride 10 mL/hr at 03/30/20 0609  . thiamine injection 250 mg (03/30/20 0220)   PRN Meds: acetaminophen   Vital Signs    Vitals:   03/30/20 0048 03/30/20 0400 03/30/20 0609 03/30/20 0828  BP: (!) 109/53 110/79  (!) 113/53  Pulse: 79 71 73 69  Resp: Temp: 98.1 F (36.7 C) 98.7 F (37.1 C)  98.1 F (36.7 C)  TempSrc: Oral Oral  Oral  SpO2: 91% 93%  96%  Weight:  67.6 kg    Height:        Intake/Output Summary (Last 24 hours) at 03/30/2020 0857 Last data filed at 03/30/2020 0829 Gross per 24 hour  Intake 294.6 ml  Output 1900 ml  Net -1605.4 ml   Last 3 Weights 03/30/2020 03/29/2020 03/28/2020  Weight (lbs) 149 lb 1.6 oz 147 lb 8.9 oz 148 lb 2.4 oz  Weight (kg) 67.631 kg 66.93 kg 67.2 kg      Telemetry    Occasional first-degree AV block no adverse arrhythmias- Personally Reviewed  ECG     No new- Personally Reviewed  Physical Exam   GEN: No acute distress.   Neck: No JVD Cardiac: RRR, soft systolic murmur, difficult to appreciate diastolic murmur Respiratory: Clear to auscultation bilaterally. GI: Soft, nontender, non-distended  MS: No edema; No deformity. Neuro:  Nonfocal  Psych: Normal affect   Labs    High Sensitivity Troponin:   Recent Labs  Lab 03/27/20 2317 03/28/20 0324 03/28/20 0819 03/28/20 1006 03/29/20 0941  TROPONINIHS 27* 121* 1,479* 1,747* 629*      Chemistry Recent Labs  Lab 03/28/20 0324 03/29/20 0941 03/30/20 0309  NA 129* 130* 128*  K 4.0 4.2 4.4  CL 96* 96* 94*  CO2 GLUCOSE 286* 221* 264*  BUN 9 7* 10  CREATININE 0.52* 0.57* 0.78  CALCIUM 8.7* 8.9 8.6*  PROT 6.0* 5.5* 5.6*  ALBUMIN 3.1* 2.9* 2.9*  AST 12* 14* 16  ALT ALKPHOS 62 57 74  BILITOT 0.4 0.7 0.3  GFRNONAA >60 >60 >60  GFRAA >60 >60 >60  ANIONGAP Hematology Recent Labs  Lab 03/28/20 0324 03/29/20 0941 03/30/20 0309  WBC 13.2* 13.7* 12.0*  RBC 4.57 4.36  4.25  HGB 13.5 12.9* 12.7*  HCT 39.5 36.9* 36.5*  MCV 86.4 84.6 85.9  MCH 29.5 29.6 29.9  MCHC 34.2 35.0 34.8  RDW 13.3 13.2 13.5  PLT 350 342 356    BNPNo results for input(s): BNP, PROBNP in the last 168 hours.   DDimer  Recent Labs  Lab 03/28/20 2216  DDIMER 0.84*     Radiology    MR Brain W and Wo Contrast  Result Date: 03/28/2020 CLINICAL DATA:  Neuro deficit EXAM: MRI HEAD WITHOUT AND WITH CONTRAST TECHNIQUE: Multiplanar, multiecho pulse sequences of the brain and surrounding structures were obtained without and with intravenous contrast. CONTRAST:  6.66mL GADAVIST GADOBUTROL 1 MMOL/ML IV SOLN COMPARISON:  03/28/2020 head CT and prior. FINDINGS: Please note that motion artifact limits evaluation. Brain: Scattered T2 hyperintense foci involving the periventricular/subcortical white matter and pons are nonspecific however commonly associated with chronic  microvascular ischemic changes. No acute infarct or intracranial hemorrhage. No midline shift, ventriculomegaly or extra-axial fluid collection. No mass lesion. No abnormal enhancement. Vascular: Normal flow voids. Skull and upper cervical spine: Normal marrow signal. Sinuses/Orbits: Normal orbits. Clear paranasal sinuses. Trace bilateral mastoid effusion. Other: None. IMPRESSION: No acute intracranial process. Mild chronic microvascular ischemic changes. Electronically Signed   By: Stana Bunting M.D.   On: 03/28/2020 17:39   MR Lumbar Spine W Wo Contrast  Result Date: 03/28/2020 CLINICAL DATA:  Low back pain EXAM: MRI LUMBAR SPINE WITHOUT AND WITH CONTRAST TECHNIQUE: Multiplanar and multiecho pulse sequences of the lumbar spine were obtained without and with intravenous contrast. CONTRAST:  6.37mL GADAVIST GADOBUTROL 1 MMOL/ML IV SOLN COMPARISON:  03/28/2020 CT L-spine. FINDINGS: Please note that motion artifact limits evaluation. Segmentation:  Standard. Alignment:  Straightening of lordosis.  No listhesis. Vertebrae: Normal bone marrow signal intensity. 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 Chronic superior L4 endplate fracture deformity with approximately 30% height loss. No significant retropulsion. No acute fracture. Conus medullaris and cauda equina: Conus extends to the L1 level. Conus and cauda equina appear normal. Disc levels: Multilevel desiccation, endplate osteophytosis and mild disc space loss. 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 L1-2: Disc bulge with superimposed right foraminal protrusion and bilateral facet hypertrophy. Patent spinal canal and bilateral neural foramen. L2-3: Disc bulge abutting the bilateral descending L3 nerve roots and bilateral facet hypertrophy. Patent spinal canal. Mild left and moderate right neural foraminal narrowing. L3-4: Disc bulge abutting the exiting L3 and descending L4 nerve roots. Superimposed left foraminal/extraforaminal protrusion.  Ligamentum flavum and bilateral facet hypertrophy. Mild spinal canal and bilateral neural foraminal narrowing. L4-5: Disc bulge with superimposed bilateral extraforaminal protrusions. There is also a superimposed central protrusion. Ligamentum flavum and bilateral facet hypertrophy. Moderate to severe spinal canal narrowing. Moderate right and mild left neural foraminal narrowing. L5-S1: Disc bulge abutting the exiting bilateral L5 nerve roots with superimposed central protrusion. Bilateral facet hypertrophy. There is also abutment of the descending bilateral S1 nerve roots. Patent spinal canal and bilateral neural foramina. Paraspinal and other soft tissues: Paraspinal soft tissue within normal limits. 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 No acute osseous abnormality. Chronic L4 compression fracture deformity with mild height loss. Moderate to severe spinal canal narrowing at the L4-5 level. Moderate right L2-3, right L4-5 neural foraminal narrowing. L3-4 disc bulge abutment of the exiting L3, descending L4 nerve roots. L5-S1 disc bulge abutment of the exiting L5 and descending S1 nerve roots. Electronically Signed   By: Stana Bunting M.D.   On: 03/28/2020 17:58  DG CHEST PORT 1 VIEW  Result Date: 03/30/2020 CLINICAL DATA:  Short of breath EXAM: PORTABLE CHEST 1 VIEW COMPARISON:  CT 03/29/2020 FINDINGS: Normal cardiac silhouette. There is bilateral lower lobe lower lobe atelectasis and effusion similar to CT 1 day prior. No focal consolidation. No pneumothorax. IMPRESSION: 1. No interval change. 2. Bibasilar atelectasis and effusions. Electronically Signed   By: Genevive BiStewart  Edmunds M.D.   On: 03/30/2020 06:12   DG CHEST PORT 1 VIEW  Result Date: 03/29/2020 CLINICAL DATA:  Hypoxia EXAM: PORTABLE CHEST 1 VIEW COMPARISON:  03/28/2020 FINDINGS: Cardiac shadow is stable. The lungs are well aerated bilaterally. Skin fold is noted over the left base laterally. Diffuse  interstitial edema is again identified and stable. No focal infiltrate is seen. IMPRESSION: Stable interstitial edema. Electronically Signed   By: Alcide CleverMark  Lukens M.D.   On: 03/29/2020 07:20   CT ANGIO CHEST AORTA W/CM & OR WO/CM  Result Date: 03/29/2020 CLINICAL DATA:  Exclude aortic dissection.  Preop AVR planning. EXAM: CT ANGIOGRAPHY CHEST WITH CONTRAST TECHNIQUE: Multidetector CT imaging of the chest was performed using the standard protocol during bolus administration of intravenous contrast. Multiplanar CT image reconstructions and MIPs were obtained to evaluate the vascular anatomy. CONTRAST:  75mL OMNIPAQUE IOHEXOL 350 MG/ML SOLN COMPARISON:  None. FINDINGS: Cardiovascular: Heart is normal size. There is a small amount of pericardial fluid present. Moderate calcified plaque over the 3 vessel coronary arteries. Mild ectasia of the ascending thoracic aorta measuring 3.7 cm in AP diameter. Aortic root measures 4.1 cm and sinotubular junction 3 cm. No evidence of dissection. Mild calcified plaque over the thoracic aorta. Mild focal noncalcified plaque along the lateral aspect of the arch just distal to the takeoff of the left subclavian artery. Normal 3 vessel takeoff from the aortic arch. Noncalcified plaque over the origin of the left subclavian artery. Remaining vascular structures are unremarkable. Mediastinum/Nodes: 1.1 cm right paratracheal lymph node likely reactive. No significant hilar adenopathy. Remaining mediastinal structures are normal. Lungs/Pleura: Moderate size bilateral pleural effusions with associated bibasilar atelectasis. Patchy areas of ground-glass opacification over the mid to upper lungs which may be due to infection. 3 mm and 4 mm nodules mild debris along the distal tracheal wall of uncertain clinical significance. Remaining airways are unremarkable. Over the right lower lobe. Upper Abdomen: Previous cholecystectomy. Calcified and noncalcified plaque over the abdominal aorta. No  acute findings. Musculoskeletal: Mild degenerative change of the spine. Review of the MIP images confirms the above findings. IMPRESSION: 1. No evidence of aortic dissection. Mild ectasia of the ascending thoracic aorta measuring 3.7 cm in AP diameter. 2. Moderate size bilateral pleural effusions with associated bibasilar atelectasis. Patchy ground-glass opacification over the mid to upper lungs which may be due to infection or inflammatory process. 1.1 cm right paratracheal lymph node likely reactive. 3. Aortic atherosclerosis. Atherosclerotic coronary artery disease. Aortic Atherosclerosis (ICD10-I70.0). Electronically Signed   By: Elberta Fortisaniel  Boyle M.D.   On: 03/29/2020 15:26   ECHOCARDIOGRAM COMPLETE  Result Date: 03/28/2020    ECHOCARDIOGRAM REPORT   Patient Name:   Adron BeneHOMAS E Novakovich Date of Exam: 03/28/2020 Medical Rec #:  [id number]      Height:       67.0 in Accession #:    [id number]     Weight:       170.0 lb Date of Birth:  09/02/1955      BSA:          1.887 m Patient Age:    [age] years  BP:           128/68 mmHg Patient Gender: M              HR:           89 bpm. Exam Location:  Inpatient Procedure: 2D Echo STAT ECHO Indications:    elevated troponin  History:        Patient has no prior history of Echocardiogram examinations.                 Signs/Symptoms:elevated troponin; Risk Factors:Hypertension,                 Diabetes and Current Smoker.  Sonographer:    Delcie Roch RDCS Referring Phys: [id number] OLADAPO ADEFESO IMPRESSIONS  1. Left ventricular ejection fraction, by estimation, is 35 to 40%. The left ventricle has moderately decreased function. The left ventricle demonstrates regional wall motion abnormalities (see scoring diagram/findings for description). The left ventricular internal cavity size was mildly dilated. There is mild concentric left ventricular hypertrophy. Left ventricular diastolic parameters are consistent with Grade II diastolic dysfunction (pseudonormalization). There is  moderate hypokinesis of the left ventricular, mid-apical inferoseptal wall and inferior wall. There is severe hypokinesis of the left ventricular, mid-apical anteroseptal wall.  2. Right ventricular systolic function is normal. The right ventricular size is normal.  3. Left atrial size was severely dilated.  4. Right atrial size was mildly dilated.  5. Likely moderate eccentric MR.. The mitral valve is abnormal. Moderate mitral valve regurgitation.  6. Fixed right coronary cusp with prominent focal calcification. Suspect bicuspid valve, but obscured by significant calcification. Severe aortic regurgitation, best seen on apical images, with mitral valve anterior leaflet restriction secondary to jet.  Vena contracta 0.65 cm on 5 chamber apical, 0.80 cm on 3 chamber apical, fills most of LVOT and projects into majority of LV cavity (see image 83). Some diastolic flow reversal seen in descending aorta but not well visualized.. The aortic valve has an indeterminant number of cusps. Aortic valve regurgitation is severe. Mild aortic valve stenosis.  7. Aortic dilatation noted. There is borderline dilatation of the ascending aorta measuring 38 mm.  8. The inferior vena cava is dilated in size with <50% respiratory variability, suggesting right atrial pressure of 15 mmHg. Comparison(s): No prior Echocardiogram. Conclusion(s)/Recommendation(s): Abnormal study. Reduced EF with both global and focal hypokinesis; mild global hypokinesis, with moderate wall motion abnormalities in the mid to apical inferoseptal and inferior wall, with severe hypokinesis in the mid to apical anteroseptal walls. Also with severe aortic regurgitation, calcified and likely bicuspid aortic valve with mild stenosis. FINDINGS  Left Ventricle: Left ventricular ejection fraction, by estimation, is 35 to 40%. The left ventricle has moderately decreased function. The left ventricle demonstrates regional wall motion abnormalities. Moderate hypokinesis of the  left ventricular, mid-apical inferoseptal wall and inferior wall. Severe hypokinesis of the left ventricular, mid-apical anteroseptal wall. The left ventricular internal cavity size was mildly dilated. There is mild concentric left ventricular hypertrophy. Left ventricular diastolic parameters are consistent with Grade II diastolic dysfunction (pseudonormalization).  LV Wall Scoring: The mid anteroseptal segment is akinetic. The mid and distal inferior wall, basal anteroseptal segment, mid inferoseptal segment, apical septal segment, and apex are hypokinetic. The entire anterior wall, entire lateral wall, basal inferior segment, and basal inferoseptal segment are normal. Right Ventricle: The right ventricular size is normal. No increase in right ventricular wall thickness. Right ventricular systolic function is normal. Left Atrium: Left atrial size was severely dilated. Right  Atrium: Right atrial size was mildly dilated. Pericardium: There is no evidence of pericardial effusion. Mitral Valve: Likely moderate eccentric MR. The mitral valve is abnormal. Moderate mitral annular calcification. Moderate mitral valve regurgitation. Tricuspid Valve: The tricuspid valve is normal in structure. Tricuspid valve regurgitation is trivial. No evidence of tricuspid stenosis. Aortic Valve: Fixed right coronary cusp with prominent focal calcification. Suspect bicuspid valve, but obscured by significant calcification. Severe aortic regurgitation, best seen on apical images, with mitral valve anterior leaflet restriction secondary to jet. Vena contracta 0.65 cm on 5 chamber apical, 0.80 cm on 3 chamber apical, fills most of LVOT and projects into majority of LV cavity (see image 83). Some diastolic flow reversal seen in descending aorta but not well visualized. The aortic valve has an indeterminant number of cusps. . There is moderate thickening and moderate calcification of the aortic valve. Aortic valve regurgitation is severe.  Aortic regurgitation PHT measures 266 msec. Mild aortic stenosis is present. There is moderate thickening of the aortic valve. There is moderate calcification of the aortic valve. Aortic valve mean gradient measures 12.0 mmHg. Aortic valve peak gradient measures 22.3 mmHg. Aortic valve area, by VTI measures 1.76 cm. Pulmonic Valve: The pulmonic valve was not well visualized. Pulmonic valve regurgitation is not visualized. Aorta: Aortic dilatation noted. There is borderline dilatation of the ascending aorta measuring 38 mm. Venous: The inferior vena cava is dilated in size with less than 50% respiratory variability, suggesting right atrial pressure of 15 mmHg. IAS/Shunts: No atrial level shunt detected by color flow Doppler.  LEFT VENTRICLE PLAX 2D LVIDd:         5.54 cm      Diastology LVIDs:         4.40 cm      LV e' lateral:   5.00 cm/s LV PW:         1.23 cm      LV E/e' lateral: 21.4 LV IVS:        1.18 cm LVOT diam:     2.10 cm LV SV:         70 LV SV Index:   37 LVOT Area:     3.46 cm  LV Volumes (MOD) LV vol d, MOD A2C: 117.0 ml LV vol d, MOD A4C: 106.0 ml LV vol s, MOD A2C: 75.0 ml LV vol s, MOD A4C: 83.4 ml LV SV MOD A2C:     42.0 ml LV SV MOD A4C:     106.0 ml LV SV MOD BP:      32.1 ml RIGHT VENTRICLE RV S prime:     13.20 cm/s TAPSE (M-mode): 2.7 cm LEFT ATRIUM             Index       RIGHT ATRIUM           Index LA diam:        4.30 cm 2.28 cm/m  RA Area:     14.80 cm LA Vol (A2C):   57.4 ml 30.41 ml/m RA Volume:   35.30 ml  18.70 ml/m LA Vol (A4C):   81.8 ml 43.34 ml/m LA Biplane Vol: 68.7 ml 36.40 ml/m  AORTIC VALVE AV Area (Vmax):    1.67 cm AV Area (Vmean):   1.74 cm AV Area (VTI):     1.76 cm AV Vmax:           236.00 cm/s AV Vmean:          158.500 cm/s AV VTI:  0.401 m AV Peak Grad:      22.3 mmHg AV Mean Grad:      12.0 mmHg LVOT Vmax:         113.50 cm/s LVOT Vmean:        79.550 cm/s LVOT VTI:          0.204 m LVOT/AV VTI ratio: 0.51 AI PHT:            266 msec  AORTA Ao  Root diam: 3.60 cm Ao Asc diam:  3.80 cm MITRAL VALVE MV Area (PHT): 6.12 cm     SHUNTS MV Decel Time: 124 msec     Systemic VTI:  0.20 m MR Peak grad:   101.6 mmHg  Systemic Diam: 2.10 cm MR Mean grad:   63.0 mmHg MR Vmax:        504.00 cm/s MR Vmean:       376.0 cm/s MR PISA:        1.01 cm MR PISA Radius: 0.40 cm MV E velocity: 107.00 cm/s MV A velocity: 69.40 cm/s MV E/A ratio:  1.54 Jodelle Red MD Electronically signed by Jodelle Red MD Signature Date/Time: 03/28/2020/12:53:56 PM    Final    VAS [place] LOWER EXTREMITY VENOUS (DVT)  Result Date: 03/29/2020  Lower Venous DVTStudy Indications: Swelling.  Comparison Study: No prior study Performing Technologist: Gertie Fey MHA, RDMS, RVT, RDCS  Examination Guidelines: A complete evaluation includes B-mode imaging, spectral Doppler, color Doppler, and power Doppler as needed of all accessible portions of each vessel. Bilateral testing is considered an integral part of a complete examination. Limited examinations for reoccurring indications may be performed as noted. The reflux portion of the exam is performed with the patient in reverse Trendelenburg.  +-----+---------------+---------+-----------+----------+--------------+ RIGHTCompressibilityPhasicitySpontaneityPropertiesThrombus Aging +-----+---------------+---------+-----------+----------+--------------+ CFV  Full           No       Yes                                 +-----+---------------+---------+-----------+----------+--------------+   +---------+---------------+---------+-----------+----------+--------------+ LEFT     CompressibilityPhasicitySpontaneityPropertiesThrombus Aging +---------+---------------+---------+-----------+----------+--------------+ CFV      Full           No       Yes                                 +---------+---------------+---------+-----------+----------+--------------+ SFJ      Full                                                         +---------+---------------+---------+-----------+----------+--------------+ FV Prox  Full                                                        +---------+---------------+---------+-----------+----------+--------------+ FV Mid   Full                                                        +---------+---------------+---------+-----------+----------+--------------+  FV DistalFull                                                        +---------+---------------+---------+-----------+----------+--------------+ PFV      Full                                                        +---------+---------------+---------+-----------+----------+--------------+ POP      Full           No       Yes                                 +---------+---------------+---------+-----------+----------+--------------+ PTV      Full                                                        +---------+---------------+---------+-----------+----------+--------------+ PERO     Full                                                        +---------+---------------+---------+-----------+----------+--------------+     Summary: RIGHT: - No evidence of common femoral vein obstruction.  LEFT: - There is no evidence of deep vein thrombosis in the lower extremity.  - No cystic structure found in the popliteal fossa. Lower extremity venous flow is pulsatile, suggestive of possibly elevated right heart pressure.  *See table(s) above for measurements and observations. Electronically signed by Fabienne Bruns MD on 03/29/2020 at 12:28:51 PM.    Final     Cardiac Studies   2D echo 03/28/2020 1. Left ventricular ejection fraction, by estimation, is 35 to 40%. The  left ventricle has moderately decreased function. The left ventricle  demonstrates regional wall motion abnormalities (see scoring  diagram/findings for description). The left  ventricular internal cavity size was mildly dilated.  There is mild  concentric left ventricular hypertrophy. Left ventricular diastolic  parameters are consistent with Grade II diastolic dysfunction  (pseudonormalization). There is moderate hypokinesis of  the left ventricular, mid-apical inferoseptal wall and inferior wall.  There is severe hypokinesis of the left ventricular, mid-apical  anteroseptal wall.  2. Right ventricular systolic function is normal. The right ventricular  size is normal.  3. Left atrial size was severely dilated.  4. Right atrial size was mildly dilated.  5. Likely moderate eccentric MR.. The mitral valve is abnormal. Moderate  mitral valve regurgitation.  6. Fixed right coronary cusp with prominent focal calcification. Suspect  bicuspid valve, but obscured by significant calcification. Severe aortic  regurgitation, best seen on apical images, with mitral valve anterior  leaflet restriction secondary to jet.  Vena contracta 0.65 cm on 5 chamber apical, 0.80 cm on 3 chamber apical,  fills most of LVOT and projects into majority of LV cavity (see image 83).  Some diastolic flow reversal seen in descending aorta but not well  visualized.. The aortic valve has an  indeterminant number of cusps. Aortic valve regurgitation is severe. Mild  aortic valve stenosis.  7. Aortic dilatation noted. There is borderline dilatation of the  ascending aorta measuring 38 mm.  8. The inferior vena cava is dilated in size with <50% respiratory  variability, suggesting right atrial pressure of 15 mmHg.   Patient Profile     63 y.o. male with a hx of DM and HTN, ETOH abuse and tobacco abusewho is being seen in consultation for the evaluation of acute pulmonary edema, LV dysfunction, moderate MR and severe ARat the request of Marguerita Merles, MD.  Assessment & Plan    Acute systolic heart failure with acute respiratory failure/pulmonary edema -Right and left heart catheterization today Currently on Lasix 40 mg IV twice  daily -Started during this hospitalization on losartan 25 and spironolactone 12.5 a day. -Currently on metoprolol 12.5 every 6 hours, I will go ahead and start to consolidate with 25 twice daily.  Transition to metoprolol succinate on discharge.  Elevated troponin -Troponin increased from 27 up to 1700 down to 629.  This rise and fall is compatible with significant demand ischemia in the setting of underlying valvular issues as well as acute systolic heart failure. -Right and left heart catheterization.  Question the possibility of underlying triple-vessel disease given the degree of troponin elevation.  Severe aortic regurgitation -Possible bicuspid aortic valve, right coronary cusp is fixed with prominent focal calcification associated with severe aortic insufficiency. -CT scan of chest did not show any evidence of dissection.  Aortic dimensions are minimally dilated/upper normal 3.7 cm. -Getting heart cath today, we will try to set up for TEE tomorrow.  Moderate mitral regurgitation -Anterior mitral valve leaflet restriction due to severe aortic insufficiency jet -TEE possibly tomorrow to assess mitral regurgitation.  Tobacco use -Continue to encourage cessation.  Type 2 diabetes with cardiomyopathy -Hemoglobin A1c 10.2, statin, ARB, consider SGLT2 inhibitor. Per main team  Hyponatremia -Possibly from underlying cardiomyopathy.  Improved with diuresis.       For questions or updates, please contact [facility] Please consult [email or web address] for contact info under        Signed, Donato Schultz, MD  03/30/2020, 8:57 AM

## 2020-03-31 ENCOUNTER — Inpatient Hospital Stay (HOSPITAL_COMMUNITY): Payer: Medicare Other

## 2020-03-31 ENCOUNTER — Encounter (HOSPITAL_COMMUNITY)
Admission: EM | Disposition: A | Discharge status: Home / Self Care | Payer: Self-pay | Source: Home / Self Care | Attending: Surgery

## 2020-03-31 ENCOUNTER — Encounter (HOSPITAL_COMMUNITY): Payer: Self-pay | Admitting: Interventional Cardiology

## 2020-03-31 ENCOUNTER — Inpatient Hospital Stay (HOSPITAL_COMMUNITY): Payer: Medicare Other | Admitting: Certified Registered Nurse Anesthetist

## 2020-03-31 ENCOUNTER — Other Ambulatory Visit: Payer: Self-pay | Admitting: *Deleted

## 2020-03-31 DIAGNOSIS — R29898 Other symptoms and signs involving the musculoskeletal system: Secondary | ICD-10-CM | POA: Diagnosis not present

## 2020-03-31 DIAGNOSIS — I351 Nonrheumatic aortic (valve) insufficiency: Secondary | ICD-10-CM

## 2020-03-31 DIAGNOSIS — I5021 Acute systolic (congestive) heart failure: Secondary | ICD-10-CM | POA: Diagnosis not present

## 2020-03-31 DIAGNOSIS — I2510 Atherosclerotic heart disease of native coronary artery without angina pectoris: Secondary | ICD-10-CM

## 2020-03-31 DIAGNOSIS — J449 Chronic obstructive pulmonary disease, unspecified: Secondary | ICD-10-CM

## 2020-03-31 DIAGNOSIS — I340 Nonrheumatic mitral (valve) insufficiency: Secondary | ICD-10-CM

## 2020-03-31 DIAGNOSIS — R778 Other specified abnormalities of plasma proteins: Secondary | ICD-10-CM | POA: Diagnosis not present

## 2020-03-31 HISTORY — PX: TEE WITHOUT CARDIOVERSION: SHX5443

## 2020-03-31 LAB — COMPREHENSIVE METABOLIC PANEL
ALT: 16 U/L (ref 0–44)
AST: 18 U/L (ref 15–41)
Albumin: 3.1 g/dL — ABNORMAL LOW (ref 3.5–5.0)
Alkaline Phosphatase: 69 U/L (ref 38–126)
Anion gap: 10 (ref 5–15)
BUN: 15 mg/dL (ref 8–23)
CO2: 29 mmol/L (ref 22–32)
Calcium: 9.3 mg/dL (ref 8.9–10.3)
Chloride: 93 mmol/L — ABNORMAL LOW (ref 98–111)
Creatinine, Ser: 0.74 mg/dL (ref 0.61–1.24)
GFR calc Af Amer: >60 mL/min (ref >60)
GFR calc non Af Amer: >60 mL/min (ref >60)
Glucose, Bld: 88 mg/dL (ref 70–99)
Potassium: 4.5 mmol/L (ref 3.5–5.1)
Sodium: 132 mmol/L — ABNORMAL LOW (ref 135–145)
Total Bilirubin: 0.7 mg/dL (ref 0.3–1.2)
Total Protein: 5.9 g/dL — ABNORMAL LOW (ref 6.5–8.1)

## 2020-03-31 LAB — CBC WITH DIFFERENTIAL/PLATELET
Abs Immature Granulocytes: 0.04 10*3/uL (ref 0.00–0.07)
Basophils Absolute: 0.1 10*3/uL (ref 0.0–0.1)
Basophils Relative: 1 %
Eosinophils Absolute: 0.4 10*3/uL (ref 0.0–0.5)
Eosinophils Relative: 4 %
HCT: 37.3 % — ABNORMAL LOW (ref 39.0–52.0)
Hemoglobin: 12.8 g/dL — ABNORMAL LOW (ref 13.0–17.0)
Immature Granulocytes: 0 %
Lymphocytes Relative: 21 %
Lymphs Abs: 2.3 10*3/uL (ref 0.7–4.0)
MCH: 29.7 pg (ref 26.0–34.0)
MCHC: 34.3 g/dL (ref 30.0–36.0)
MCV: 86.5 fL (ref 80.0–100.0)
Monocytes Absolute: 1 10*3/uL (ref 0.1–1.0)
Monocytes Relative: 9 %
Neutro Abs: 7.3 10*3/uL (ref 1.7–7.7)
Neutrophils Relative %: 65 %
Platelets: 355 10*3/uL (ref 150–400)
RBC: 4.31 MIL/uL (ref 4.22–5.81)
RDW: 13.5 % (ref 11.5–15.5)
WBC: 11.1 10*3/uL — ABNORMAL HIGH (ref 4.0–10.5)
nRBC: 0 % (ref 0.0–0.2)

## 2020-03-31 LAB — GLUCOSE, CAPILLARY
Glucose-Capillary: 98 mg/dL (ref 70–99)
Glucose-Capillary: 137 mg/dL — ABNORMAL HIGH (ref 70–99)
Glucose-Capillary: 217 mg/dL — ABNORMAL HIGH (ref 70–99)
Glucose-Capillary: 310 mg/dL — ABNORMAL HIGH (ref 70–99)

## 2020-03-31 LAB — PHOSPHORUS: Phosphorus: 5.6 mg/dL — ABNORMAL HIGH (ref 2.5–4.6)

## 2020-03-31 LAB — ECHO TEE
MV M vel: 4.6 m/s
MV Peak grad: 84.6 mmHg
P 1/2 time: 459 msec
Radius: 0.8 cm

## 2020-03-31 LAB — MAGNESIUM: Magnesium: 1.8 mg/dL (ref 1.7–2.4)

## 2020-03-31 SURGERY — ECHOCARDIOGRAM, TRANSESOPHAGEAL
Anesthesia: Monitor Anesthesia Care

## 2020-03-31 MED ORDER — PHENYLEPHRINE HCL-NACL 10-0.9 MG/250ML-% IV SOLN
INTRAVENOUS | Status: DC | PRN
Start: 2020-03-31 — End: 2020-03-31
  Administered 2020-03-31: 25 ug/min via INTRAVENOUS

## 2020-03-31 MED ORDER — LIDOCAINE 2% (20 MG/ML) 5 ML SYRINGE
INTRAMUSCULAR | Status: DC | PRN
  Administered 2020-03-31: 60 mg via INTRAVENOUS

## 2020-03-31 MED ORDER — LACTATED RINGERS IV SOLN
INTRAVENOUS | Status: DC | PRN
Start: 2020-03-31 — End: 2020-03-31

## 2020-03-31 MED ORDER — DEXMEDETOMIDINE (PRECEDEX) IN NS 20 MCG/5ML (4 MCG/ML) IV SYRINGE
PREFILLED_SYRINGE | INTRAVENOUS | Status: DC | PRN
  Administered 2020-03-31: 4 ug via INTRAVENOUS
  Administered 2020-03-31: 12 ug via INTRAVENOUS

## 2020-03-31 MED ORDER — PROPOFOL 500 MG/50ML IV EMUL
INTRAVENOUS | Status: DC | PRN
  Administered 2020-03-31: 75 ug/kg/min via INTRAVENOUS

## 2020-03-31 NOTE — Progress Notes (Signed)
Inpatient Diabetes Program Recommendations  AACE/ADA: New Consensus Statement on Inpatient Glycemic Control (2015)  Target Ranges:  Prepandial:   less than 140 mg/dL      Peak postprandial:   less than 180 mg/dL (1-2 hours)      Critically ill patients:  140 - 180 mg/dL   Lab Results  Component Value Date   GLUCAP 137 (H) 03/31/2020   HGBA1C 10.2 (H) 03/28/2020    Review of Glycemic Control Results for Scott Rios, Scott Rios (MRN [id number]) as of 03/31/2020 13:59  Ref. Range 03/30/2020 16:22 03/30/2020 21:48 03/31/2020 08:36 03/31/2020 11:34  Glucose-Capillary Latest Ref Range: 70 - 99 mg/dL 131 (H) 281 (H) 98 137 (H)   Diabetes history: Type 2 DM Outpatient Diabetes medications: none  Current orders for Inpatient glycemic control: Lantus 12 units QD, Novolog 0-9 units TID, Novolog 0-5 units QHS  Inpatient Diabetes Program Recommendations:    Spoke with patient regarding diabetes management. Patient states, "I have not been to a doctor in over 15 years."  Reviewed patient's current A1c of 10.2%. Explained what a A1c is and what it measures. Also reviewed goal A1c with patient, importance of good glucose control @ home, and blood sugar goals. Reviewed survival skills, interventions, patho of DM, vascular changes, impact of glucose control on infection and long term cardiac outcomes, and other commorbidities.  Patient will need a meter at discharge. Blood glucose meter kit (inlcudes lancets and strips) (#[id number]). Encouraged patient to begin learning how to check blood sugars and reviewed recommended frequency of twice per day and stressed the importance of following up with a primary care doctor. He states he will need help arranging this to ensure Medicare covers. Will place CM consult to assist.  Also, reviewed with patient the importance of eliminating sugary beverages from diet. Admits to drinking sodas and juices daily and is motivated to make changes. Reviewed alternatives, plate method and  nutritional labels. Consult placed for dietitian.  Patient has no further questions at this time.  At discharge, anticipate need for oral anti-diabetic medications. Consider: Metformin 500 mg BID and Glipizide 5 mg QD.   Thanks, [name], MSN, RNC-OB Diabetes Coordinator [phone] (8a-5p)

## 2020-03-31 NOTE — Transfer of Care (Signed)
Immediate Anesthesia Transfer of Care Note  Patient: Scott Rios  Procedure(s) Performed: TRANSESOPHAGEAL ECHOCARDIOGRAM (TEE) (N/A )  Patient Location: Endoscopy Unit  Anesthesia Type:MAC  Level of Consciousness: drowsy  Airway & Oxygen Therapy: Patient Spontanous Breathing and Patient connected to nasal cannula oxygen  Post-op Assessment: Report given to RN and Post -op Vital signs reviewed and stable  Post vital signs: Reviewed and stable  Last Vitals:  Vitals Value Taken Time  BP 89/38 03/31/20 1017  Temp    Pulse 58 03/31/20 1019  Resp 16 03/31/20 1019  SpO2 100 % 03/31/20 1019  Vitals shown include unvalidated device data.  Last Pain:  Vitals:   03/31/20 1017  TempSrc:   PainSc: Asleep      Patients Stated Pain Goal: 0 (27/07/86 7544)  Complications: No complications documented.

## 2020-03-31 NOTE — Progress Notes (Signed)
CARDIAC REHAB PHASE I   PRE:  Rate/Rhythm: 73 SR    BP: sitting 112/58    SaO2: 96 RA  MODE:  Ambulation: 150 ft   POST:  Rate/Rhythm: 87 SR    BP: sitting 133/76     SaO2: 95 RA  Pt able to stand and walk with RW. Fairly steady with RW, no major c/o. General weakness, slow pace. Return to recliner, VSS. Gave IS, [phone] mL. Encouraged use. Gave OHS materials but pt feeling overwhelmed and would like to discuss them tomorrow. Will f/u. Son arrived. [phone]   Harriet Masson CES, ACSM 03/31/2020 2:37 PM

## 2020-03-31 NOTE — Progress Notes (Signed)
Nutrition Consult - Diet Education  RD consulted for nutrition education regarding diabetes.   Lab Results  Component Value Date   HGBA1C 10.2 (H) 03/28/2020    RD provided "Heart Healthy Consistent Carbohydrate Nutrition Therapy" handout from the Academy of [employer] and Dietetics. Discussed different food groups and their effects on blood sugar, emphasizing carbohydrate-containing foods. Provided list of carbohydrates and recommended serving sizes of common foods.  Discussed importance of controlled and consistent carbohydrate intake throughout the day. Provided examples of ways to balance meals/snacks and encouraged intake of high-fiber, whole grain complex carbohydrates.   Provided examples on ways to decrease sodium and fat intake in diet. Discouraged intake of processed foods and use of salt shaker. Encouraged fresh fruits and vegetables as well as whole grain sources of carbohydrates to maximize fiber intake. Teach back method used.  Expect good compliance.  Body mass index is 23.34 kg/m. Pt meets criteria for normal weight based on current BMI.  Current diet order is heart healthy, patient is consuming approximately 100% of meals at this time. Labs and medications reviewed. No further nutrition interventions warranted at this time. RD contact information provided. If additional nutrition issues arise, please re-consult RD.  Gabriel Rainwater, RD, LDN, CNSC Please refer to [facility] for contact information.

## 2020-03-31 NOTE — CV Procedure (Signed)
    TRANSESOPHAGEAL ECHOCARDIOGRAM   NAME:  Scott Rios   MRN: [id number] DOB:  1956-06-25   ADMIT DATE: 03/27/2020  INDICATIONS: Aortic and mitral valve dysfunction  PROCEDURE:   Informed consent was obtained prior to the procedure. The risks, benefits and alternatives for the procedure were discussed and the patient comprehended these risks.  Risks include, but are not limited to, cough, sore throat, vomiting, nausea, somnolence, esophageal and stomach trauma or perforation, bleeding, low blood pressure, aspiration, pneumonia, infection, trauma to the teeth and death.    Procedural time out performed. The oropharynx was anesthetized with topical 1% cetacaine.    The patient received monitored anesthesia care under the supervision of Dr. Donavan Foil. He was administered 190 mg of propofol, 16 mcg precedex. He was given neosynephrine to maintain blood pressure throughout the procedure.  The transesophageal probe was inserted in the esophagus and stomach without difficulty and multiple views were obtained.    COMPLICATIONS:    There were no immediate complications.  FINDINGS:  LEFT VENTRICLE: EF = 35-40%, with both regional and global wall motion abnormalities.  RIGHT VENTRICLE: Normal size and function.   LEFT ATRIUM: No thrombus/mass.  LEFT ATRIAL APPENDAGE: No thrombus/mass.   RIGHT ATRIUM: No thrombus/mass.  AORTIC VALVE:  Appears functionally bicuspid. Severe AI based on extension of jet across LV and vena contracta size. No vegetation.  MITRAL VALVE:  Abnormal structure, appears to have some MR due to annular dilation, but there is also a focal eccentric jet of regurgitation. There appears to possible be a cleft or perforation on 3D images adjacent to P2. At least moderate MR. PISA attempted but likely inaccurate due to eccentricity and multiple jets. No vegetation.  TRICUSPID VALVE: Normal structure. Trivial regurgitation. No vegetation.  PULMONIC VALVE: Grossly normal  structure. Trivial regurgitation. No apparent vegetation.  INTERATRIAL SEPTUM: No PFO or ASD seen by color Doppler.  PERICARDIUM: No effusion noted.  DESCENDING AORTA: Moderate diffuse plaque seen   CONCLUSION: Severe AI, functionally bicuspid aortic valve, at least moderate MR that is eccentric, appears partially due to annular dilation but also appears to have a cleft/perforation adjacent to P2.   Jodelle Red, MD, PhD [facility]  [address], [address] [place], [place] [address] [phone]   10:13 AM

## 2020-03-31 NOTE — Anesthesia Postprocedure Evaluation (Signed)
Anesthesia Post Note  Patient: Scott Rios  Procedure(s) Performed: TRANSESOPHAGEAL ECHOCARDIOGRAM (TEE) (N/A )     Patient location during evaluation: PACU Anesthesia Type: MAC Level of consciousness: awake and alert Pain management: pain level controlled Vital Signs Assessment: post-procedure vital signs reviewed and stable Respiratory status: spontaneous breathing, nonlabored ventilation, respiratory function stable and patient connected to nasal cannula oxygen Cardiovascular status: stable and blood pressure returned to baseline Postop Assessment: no apparent nausea or vomiting Anesthetic complications: no   No complications documented.  Last Vitals:  Vitals:   03/31/20 1040 03/31/20 1050  BP: (!) 85/48 (!) 84/46  Pulse: 60 (!) 59  Resp: (!) 25 18  Temp:    SpO2: 95% 96%    Last Pain:  Vitals:   03/31/20 1050  TempSrc:   PainSc: 0-No pain                 Merlinda Frederick

## 2020-03-31 NOTE — Interval H&P Note (Signed)
History and Physical Interval Note:  03/31/2020 9:05 AM  Scott Rios  has presented today for surgery, with the diagnosis of SEVERE AORTIC STENOSIS.  The various methods of treatment have been discussed with the patient and family. After consideration of risks, benefits and other options for treatment, the patient has consented to  Procedure(s): TRANSESOPHAGEAL ECHOCARDIOGRAM (TEE) (N/A) as a surgical intervention.  The patient's history has been reviewed, patient examined, no change in status, stable for surgery.  I have reviewed the patient's chart and labs.  Questions were answered to the patient's satisfaction.     Levent Kornegay Cristal Deer

## 2020-03-31 NOTE — Progress Notes (Signed)
PROGRESS NOTE    Scott Rios  ZOX:[id number] DOB: June 26, 1956 DOA: 03/27/2020 PCP: Elfredia Nevins, MD   Brief Narrative:  HPI per Dr. Frankey Shown on 03/28/20 Scott Rios is a 64 y.o. male with medical history significant for hypertension and diabetes not on home medication, tobacco abuse and prior history of alcohol abuse (quit 10 years ago) who presents to the emergency department due to 55-month history of right leg weakness.  Patient states that the right leg sometimes go out on him.  He complained of difficulty in being able to stand after using the toilet last evening and he ended up crawling into the living room.  Right leg was numb and weak and with more difficulty in being able to raise the leg.  He complained of chronic constipation with last bowel movement pain 8 days ago, however there was no complaint of back pain, bowel or bladder changes.  Right hand was only significant for occasional tingling sensation.  He denies fever, chills, chest pain, shortness of breath or having any sick contact.  ED Course:  In the emergency department, he was hemodynamically stable.  Work-up in the ED showed mild leukocytosis, hyponatremia, hyperglycemia, magnesium 1.4, troponin x 2 27> 121, hypoalbuminemia.  SARS coronavirus was negative.  Initial chest x-ray showed borderline cardiomegaly with diffuse interstitial thickening suspicious for pulmonary edema, possible pulmonary nodule was also noted in the right midlung.  Patient suddenly became acutely hypoxic, tachypneic and gasping for breath when he returned from CT.  IV Lasix was given, IV fluids were stopped and patient was placed on BiPAP, repeat a chest x-ray was consistent with mild diffuse pulmonary interstitial edema.  CT of head showed no acute acute abnormality.  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   IV thiamine 500 mg x 1 was given.  Hospitalist was asked to admit patient for further evaluation and management.  **Interim History  Patient's shortness of breath is improved with the Lasix and his leg weakness is improving as well and he was able to walk with PT.  States that he has more mobility in his leg today than he did yesterday.  Cardiology following and recommending a CTA of the chest to rule out dissection and patient underwent a cardiac catheterization yesterday and a TEE this morning.. Will add Lantus for his Uncontrolled DM Type 2 with Hyperglycemia.   Given his test results he has severe multivessel disease and significant aortic regurgitation and will require a CABG and AVR and possible mitral valve repair.  Cardiothoracic surgery was consulted and recommending preoperative work-up and obtaining an orthopantogram as well as carotid Dopplers.  Orthopantogram showed a periapical lucency so he requested that dental services be consulted.  Assessment & Plan:   Principal Problem:   Right leg weakness Active Problems:   Flash pulmonary edema (HCC)   Hypomagnesemia   Hypoalbuminemia   Hyperglycemia   Hyponatremia   Elevated troponin   Pulmonary nodule   Acute respiratory failure with hypoxia (HCC)   Tobacco abuse   Acute pulmonary edema (HCC)   Weakness of right lower extremity   Acute systolic heart failure (HCC)   Nonrheumatic mitral valve regurgitation   Nonrheumatic aortic valve insufficiency  Right leg weakness rule out structural lesion in brain/lumbar spine, thiamine deficiency, CIDP, improving  -CT of head showed no acute acute abnormality.   -CT lumbar spine without contrast showed  no acute abnormality within the lumbar spine but did show multifactorial degenerative changes at L4-5 with resultant severe spinal stenosis. -Continue IV thiamine 500 mg 3 times a day for 2 consecutive days, then -Continue IV thiamine 250mg  daily for 5 days (combination with other B vitamins)  and then continue Thiamine well p.o. daily Nonemergent MRI brain and lumbar spine with and without contrast will be done per teleneurologist recommendation. 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 No acute osseous abnormality. Chronic L4 compression fracture deformity with mild height loss. Moderate to severe spinal canal narrowing at the L4-5 level. Moderate right L2-3, right L4-5 neural foraminal narrowing. L3-4 disc bulge abutment of the exiting L3, descending L4 nerve roots. L5-S1 disc bulge abutment of the exiting L5 and descending S1 nerve roots." -Neurology Dr. Iver NestleBhagat evaluated the case and feels that this is a chronic issue and that there is no acute process. -PT/OT to evaluate and Treat recommending Home Health as he was able to walk with them; He feels his leg is much improved   Acute Respiratory failure with hypoxia requiring NIPPV secondary to flash pulmonary edema in the setting of Combined Acute Systolic and Diastolic CHF, improved -Patient went into acute respiratory distress on arrival from CT -Initial CXR showed "Cardiomegaly with mild diffuse pulmonary interstitial edema.  Suspected underlying emphysema.  Aortic Atherosclerosis." -D-Dimer was 0.84 -Initially  was placed on BiPAP, IV Lasix 40 Mg x1 was given with subsequent improvement; Continuing IV Lasix 40 mg q12h -Echocardiogram checked and he has a new Cardiomyopathy -SpO2: 95 % O2 Flow Rate (L/min): 2 L/min FiO2 (%): 45 % ; Was OFF of Supplemental O2 today again -Cardiology will be consulted and recommended CTA Chest Aorta to r/o Dissection -CTA showed "No evidence of aortic dissection. Mild ectasia of the ascending thoracic aorta measuring 3.7 cm in AP diameter. Moderate size bilateral pleural effusions with associated bibasilar atelectasis. Patchy ground-glass opacification over the mid to upper lungs which  may be due to infection or inflammatory process. 1.1 cm right paratracheal lymph node likely reactive.  Aortic atherosclerosis. Atherosclerotic coronary artery disease.." -C/w Diuresis with IV Lasix 40 mg BID per Cardiology Reccs -ECHO showed EF of 35-40% and Grade 2 DD -Also has Significant AR and MR -Repeat CXR today showed "Cardiomegaly with mild pulmonary venous congestion noted on today's exam. Bilateral interstitial prominence again noted. Interstitial edema and/or pneumonitis cannot be excluded. Low lung volumes with persistent bibasilar atelectasis/infiltrates. Small bilateral pleural effusions again noted." -Cardiology recommending Right and Left Heart Cath and showed severe multivessel disease and severe Aortic Regurge and Moderate to Severe Mitral Regurge -He had a TEE today as well  -Cardiology has started Losartan 25 mg po Daily and Spironolactone 12.5 mg po Daily   -Cardiology had also started scheduled Metoprolol and have consolidated it today to 25 mg po BID  NSTEMI and Elevated Troponin possibly secondary to flash pulmonary edema with associated bilateral pleural effusions  -Troponin 27 -> 121 -> 1479 -> 1747 -> 629 -EKG shows sinus rhythm at rate of 90 bpm with no significant change from prior EKG done on arrival. -Patient does not complain of any chest pain but did have shortness of breath and this could be an anginal equivalent given his uncontrolled diabetes mellitus type 2 -This is likely from underlying severe CAD  -Continue to trend troponin -Cardiology consulted for further evaluation recommendations and feel that his  elevation could be due to acute pulmonary edema in the setting of undiagnosed dilated cardiomyopathy but also could be due to CAD and they are recommending him undergoing a left and right heart cath but wanted to rule out aortic dissection first and CTA of the chest aorta showed no evidence of any dissection -Cardiology started on atorvastatin 80 mg p.o.  daily -Cardiothoracic Surgery consulted and they feel patient would benefit from a CABG and AVR and likely Mitral Valve repair; CVTS requested an Orthopantogram; Orthopantogram showed ". Right mandible tooth fragment with inflammatory periapical lucency. Otherwise there are only solitary posterior maxillary molars." -Carotid Dopplers pending to be done  -Cardiothoracic Surgery recommending Dental Evaluation prior to Surgery; Will need to reach out to Dr. Kristin Bruins and have left a message for him at [phone]  Dilated cardiomyopathy -He has an EF of 35 to 40% with wall motion abnormalities as well so we are left atrial enlargement -TSH has been normal -Cardiology starting low-dose ARB with losartan 25 mg p.o. daily as well as spironolactone 12.5 mg daily -Further care per cardiology; Cardiology also started the patient on Metoprolol Tartrate 12.5 mg po q6h and have consolidated it to 25 mg po BID  -Cath Results as below -Continues to Get IV Lasix 40 mg q12h   Elevated D-Dimer -CTA of the chest as above -Checking lower extremity venous duplex and showed no evidence of DVT.  Severe aortic regurgitation -Echocardiogram done -Cardiology recommending TEE for further evaluation and will be done 03/31/20 -TEE done and showed "Severe AI, functionally bicuspid aortic valve, at least moderate MR that is eccentric, appears partially due to annular dilation but also appears to have a cleft/perforation adjacent to P2" -Patient will need an AVR  Mitral regurgitation  -Cardiology recommending TEE to assess his mitral regurgitation and feel that this mitral regurgitation is likely exacerbated by his cardiomyopathy; TEE to be done 03/31/20 at 9:00 AM  -TEE showed "Severe AI, functionally bicuspid aortic valve, at least moderate MR that is eccentric, appears partially due to annular dilation but also appears to have a cleft/perforation adjacent to P2"  Hypomagnesemia -Mg 1.4 on admission and repeat today  1.8 -Continue to Monitor and Replete as Necessary -Repeat Magnesium in the AM  Hypoalbuminemia possibly secondary to mild protein calorie malnutrition -Albumin was 3.1; -Protein supplement will be provided -Dietitian will be consulted for further evaluation and recommendations  Uncontrolled Diabetes Mellitus Type 2 with Hyperglycemia -Blood glucose level was 344, hemoglobin A1c was 10.2 -Continue with sensitive NovoLog sliding cell insulin before meals and at bedtime  -Continue to monitor blood sugars carefully; CBGs ranging from 98-281 and had a blood sugar of 88 on CMP today. -Will start him on Lantus 12 units daily -Diabetes Education Coordidnator consulted for further evaluation and recommendations -Cardiology started him on atorvastatin 80 mg daily  Incidental pulmonary nodule/paratracheal lymph node -Chest x-ray showed possible pulmonary nodule in the right mid lung -CT chest per recommendation will be considered when patient is more stable, considering that he developed flash pulmonary edema when he had CT of lumbar spine -CTA of the chest evaluating for aortic dissection showed patchy groundglass opacification of the mid to upper lungs which may be due to infection inflammatory process and there is a 1.1 cm right paratracheal lymph node which was likely reactive -Continue to monitor and follow in outpatient setting  Low Back Pain -CT lumbar spine without contrast showed no acute abnormality within the lumbar spine, but it showed:  Multifactorial degenerative changes at L4-5  with resultant severe spinal stenosis.  Left foraminal to extraforaminal disc protrusion at L3-4, potentially affecting the exiting left L3 nerve root. Chronic L4 compression fracture with mild 30% height loss and trace 2 mm bony retropulsion. -Continue Tylenol as needed for back pain -Continue PT/OT eval and treat. -MRI of the lumbar spine as above  Tobacco Abuse -Patient counseled on tobacco abuse  cessation and he acknowledged understanding discussion -Continue nicotine patch 21 mg TD   Hyponatremia -In the setting of hypervolemia as well as hyperglycemia -Continue with diuresis and control blood sugars -Sodium was slowly improving and has gone from 129 -> 130 -> 128 -> 132 -Continue monitor and trend and repeat CMP in a.m.  Leukocytosis -Likely reactive -WBC went from 13.2 -> 13.7 -> 12.0 -> 11.1 -Continue to Monitor and Trend -Repeat CBC in the AM  Normocytic Anemia -Patient's Hgb/Hct went from 13.5/39.5 -> 12.9/36.9 -> 12.7/36.5 -> 12.8/37.3 -Check Anemia Panel in the AM -Continue to Monitor for S/Sx of Bleeding; Currently no overt bleeding noted -Repeat CBC in AM   DVT prophylaxis: Will add Heparin 5,000 units sq q8h Code Status: FULL CODE  Family Communication: Discussed with Son at bedside  Disposition Plan: Pending further cardiac clearance and evaluation and likely will be home with home health PT OT is recommended this. He is going for a Cardiac Cath today   Status is: Inpatient  Remains inpatient appropriate because:Ongoing diagnostic testing needed not appropriate for outpatient work up, Unsafe d/c plan, IV treatments appropriate due to intensity of illness or inability to take PO and Inpatient level of care appropriate due to severity of illness   Dispo: The patient is from: Home              Anticipated d/c is to: Home              Anticipated d/c date is: 2 days              Patient currently is not medically stable to d/c.  Consultants:   Neurology  Cardiology   Cardiothoracic Surgery  Will need to Call Dr. Kristin Bruins of Dentistry  Procedures:  ECHOCARDIOGRAM IMPRESSIONS    1. Left ventricular ejection fraction, by estimation, is 35 to 40%. The  left ventricle has moderately decreased function. The left ventricle  demonstrates regional wall motion abnormalities (see scoring  diagram/findings for description). The left  ventricular internal  cavity size was mildly dilated. There is mild  concentric left ventricular hypertrophy. Left ventricular diastolic  parameters are consistent with Grade II diastolic dysfunction  (pseudonormalization). There is moderate hypokinesis of  the left ventricular, mid-apical inferoseptal wall and inferior wall.  There is severe hypokinesis of the left ventricular, mid-apical  anteroseptal wall.  2. Right ventricular systolic function is normal. The right ventricular  size is normal.  3. Left atrial size was severely dilated.  4. Right atrial size was mildly dilated.  5. Likely moderate eccentric MR.. The mitral valve is abnormal. Moderate  mitral valve regurgitation.  6. Fixed right coronary cusp with prominent focal calcification. Suspect  bicuspid valve, but obscured by significant calcification. Severe aortic  regurgitation, best seen on apical images, with mitral valve anterior  leaflet restriction secondary to jet.  Vena contracta 0.65 cm on 5 chamber apical, 0.80 cm on 3 chamber apical,  fills most of LVOT and projects into majority of LV cavity (see image 83).  Some diastolic flow reversal seen in descending aorta but not well  visualized.. The aortic valve  has an  indeterminant number of cusps. Aortic valve regurgitation is severe. Mild  aortic valve stenosis.  7. Aortic dilatation noted. There is borderline dilatation of the  ascending aorta measuring 38 mm.  8. The inferior vena cava is dilated in size with <50% respiratory  variability, suggesting right atrial pressure of 15 mmHg.   Comparison(s): No prior Echocardiogram.   Conclusion(s)/Recommendation(s): Abnormal study. Reduced EF with both  global and focal hypokinesis; mild global hypokinesis, with moderate wall  motion abnormalities in the mid to apical inferoseptal and inferior wall,  with severe hypokinesis in the mid  to apical anteroseptal walls. Also with severe aortic regurgitation,  calcified and likely  bicuspid aortic valve with mild stenosis.   FINDINGS  Left Ventricle: Left ventricular ejection fraction, by estimation, is 35  to 40%. The left ventricle has moderately decreased function. The left  ventricle demonstrates regional wall motion abnormalities. Moderate  hypokinesis of the left ventricular,  mid-apical inferoseptal wall and inferior wall. Severe hypokinesis of the  left ventricular, mid-apical anteroseptal wall. The left ventricular  internal cavity size was mildly dilated. There is mild concentric left  ventricular hypertrophy. Left  ventricular diastolic parameters are consistent with Grade II diastolic  dysfunction (pseudonormalization).     LV Wall Scoring:  The mid anteroseptal segment is akinetic. The mid and distal inferior  wall,  basal anteroseptal segment, mid inferoseptal segment, apical septal  segment,  and apex are hypokinetic. The entire anterior wall, entire lateral wall,  basal inferior segment, and basal inferoseptal segment are normal.   Right Ventricle: The right ventricular size is normal. No increase in  right ventricular wall thickness. Right ventricular systolic function is  normal.   Left Atrium: Left atrial size was severely dilated.   Right Atrium: Right atrial size was mildly dilated.   Pericardium: There is no evidence of pericardial effusion.   Mitral Valve: Likely moderate eccentric MR. The mitral valve is abnormal.  Moderate mitral annular calcification. Moderate mitral valve  regurgitation.   Tricuspid Valve: The tricuspid valve is normal in structure. Tricuspid  valve regurgitation is trivial. No evidence of tricuspid stenosis.   Aortic Valve: Fixed right coronary cusp with prominent focal  calcification. Suspect bicuspid valve, but obscured by significant  calcification. Severe aortic regurgitation, best seen on apical images,  with mitral valve anterior leaflet restriction  secondary to jet. Vena contracta 0.65 cm on 5  chamber apical, 0.80 cm on 3  chamber apical, fills most of LVOT and projects into majority of LV cavity  (see image 83). Some diastolic flow reversal seen in descending aorta but  not well visualized. The  aortic valve has an indeterminant number of cusps. . There is moderate  thickening and moderate calcification of the aortic valve. Aortic valve  regurgitation is severe. Aortic regurgitation PHT measures 266 msec. Mild  aortic stenosis is present. There is  moderate thickening of the aortic valve. There is moderate calcification  of the aortic valve. Aortic valve mean gradient measures 12.0 mmHg. Aortic  valve peak gradient measures 22.3 mmHg. Aortic valve area, by VTI measures  1.76 cm.   Pulmonic Valve: The pulmonic valve was not well visualized. Pulmonic valve  regurgitation is not visualized.   Aorta: Aortic dilatation noted. There is borderline dilatation of the  ascending aorta measuring 38 mm.   Venous: The inferior vena cava is dilated in size with less than 50%  respiratory variability, suggesting right atrial pressure of 15 mmHg.   IAS/Shunts: No atrial level  shunt detected by color flow Doppler.     LEFT VENTRICLE  PLAX 2D  LVIDd:     5.54 cm   Diastology  LVIDs:     4.40 cm   LV e' lateral:  5.00 cm/s  LV PW:     1.23 cm   LV E/e' lateral: 21.4  LV IVS:    1.18 cm  LVOT diam:   2.10 cm  LV SV:     70  LV SV Index:  37  LVOT Area:   3.46 cm    LV Volumes (MOD)  LV vol d, MOD A2C: 117.0 ml  LV vol d, MOD A4C: 106.0 ml  LV vol s, MOD A2C: 75.0 ml  LV vol s, MOD A4C: 83.4 ml  LV SV MOD A2C:   42.0 ml  LV SV MOD A4C:   106.0 ml  LV SV MOD BP:   32.1 ml   RIGHT VENTRICLE  RV S prime:   13.20 cm/s  TAPSE (M-mode): 2.7 cm   LEFT ATRIUM       Index    RIGHT ATRIUM      Index  LA diam:    4.30 cm 2.28 cm/m RA Area:   14.80 cm  LA Vol (A2C):  57.4 ml 30.41 ml/m RA Volume:  35.30 ml 18.70  ml/m  LA Vol (A4C):  81.8 ml 43.34 ml/m  LA Biplane Vol: 68.7 ml 36.40 ml/m  AORTIC VALVE  AV Area (Vmax):  1.67 cm  AV Area (Vmean):  1.74 cm  AV Area (VTI):   1.76 cm  AV Vmax:      236.00 cm/s  AV Vmean:     158.500 cm/s  AV VTI:      0.401 m  AV Peak Grad:   22.3 mmHg  AV Mean Grad:   12.0 mmHg  LVOT Vmax:     113.50 cm/s  LVOT Vmean:    79.550 cm/s  LVOT VTI:     0.204 m  LVOT/AV VTI ratio: 0.51  AI PHT:      266 msec    AORTA  Ao Root diam: 3.60 cm  Ao Asc diam: 3.80 cm   MITRAL VALVE  MV Area (PHT): 6.12 cm   SHUNTS  MV Decel Time: 124 msec   Systemic VTI: 0.20 m  MR Peak grad:  101.6 mmHg Systemic Diam: 2.10 cm  MR Mean grad:  63.0 mmHg  MR Vmax:    504.00 cm/s  MR Vmean:    376.0 cm/s  MR PISA:    1.01 cm  MR PISA Radius: 0.40 cm  MV E velocity: 107.00 cm/s  MV A velocity: 69.40 cm/s  MV E/A ratio: 1.54   LEFT AND RIGHT CARDIAC CATHETERIZATION   Prox RCA to Mid RCA lesion is 80% stenosed.  RPDA lesion is 90% stenosed.  1st Mrg lesion is 100% stenosed.  3rd Mrg lesion is 75% stenosed.  Prox Cx to Mid Cx lesion is 75% stenosed.  Mid LAD lesion is 100% stenosed.  Mid LAD to Dist LAD lesion is 70% stenosed.  2nd Diag lesion is 40% stenosed.  Prox LAD to Mid LAD lesion is 25% stenosed.  LV end diastolic pressure is normal.  There is moderate (3+) aortic regurgitation.  There is mild left ventricular systolic dysfunction.  The left ventricular ejection fraction is 35-45% by visual estimate.  Right heart results on room air: Ao sat 90%, PA sat 57%, PA pressure 28/12, mean PA 18 mm Hg;  mean PCWP 17 mm Hg; CO 4.1 L/min; CI 2.33   Severe three vessel disease.  Moderate to severe aortic insufficiency.  Plan for TEE tomorrow.   Will need cardiac surgery consult for CABG and likely AVR.  TRANSESOPHAGEAL ECHOCARDIOGRAM FINDINGS:  LEFT VENTRICLE: EF = 35-40%, with  both regional and global wall motion abnormalities.  RIGHT VENTRICLE: Normal size and function.   LEFT ATRIUM: No thrombus/mass.  LEFT ATRIAL APPENDAGE: No thrombus/mass.   RIGHT ATRIUM: No thrombus/mass.  AORTIC VALVE:  Appears functionally bicuspid. Severe AI based on extension of jet across LV and vena contracta size. No vegetation.  MITRAL VALVE:  Abnormal structure, appears to have some MR due to annular dilation, but there is also a focal eccentric jet of regurgitation. There appears to possible be a cleft or perforation on 3D images adjacent to P2. At least moderate MR. PISA attempted but likely inaccurate due to eccentricity and multiple jets. No vegetation.  TRICUSPID VALVE: Normal structure. Trivial regurgitation. No vegetation.  PULMONIC VALVE: Grossly normal structure. Trivial regurgitation. No apparent vegetation.  INTERATRIAL SEPTUM: No PFO or ASD seen by color Doppler.  PERICARDIUM: No effusion noted.  DESCENDING AORTA: Moderate diffuse plaque seen   CONCLUSION: Severe AI, functionally bicuspid aortic valve, at least moderate MR that is eccentric, appears partially due to annular dilation but also appears to have a cleft/perforation adjacent to P2.  Antimicrobials:  Anti-infectives (From admission, onward)   None     Subjective: Seen and examined at bedside after his TEE and he states that he is not short of breath.  No nausea or vomiting.  Thinks his legs are doing better.  Understands that the Cardiothoracic Surgeons will come talk with him.  No other concerns or complaints at this time.  Objective: Vitals:   03/31/20 1126 03/31/20 1300 03/31/20 1305 03/31/20 1404  BP: 119/71 (!) 104/51  (!) 112/58  Pulse: 65 (!) 117 (!) 130 72  Resp:  18 18   Temp: 98.3 F (36.8 C)     TempSrc: Oral     SpO2:  97% 97% 95%  Weight:      Height:        Intake/Output Summary (Last 24 hours) at 03/31/2020 1625 Last data filed at 03/31/2020 1403 Gross per 24  hour  Intake 699 ml  Output 1600 ml  Net -901 ml   Filed Weights   03/30/20 0400 03/31/20 0338 03/31/20 0907  Weight: 67.6 kg 67.4 kg 67.6 kg   Examination: Physical Exam:  Constitutional: The patient is a thin chronically ill-appearing Caucasian male currently no acute distress sitting in a chair bedside and is a little anxious. Eyes: Lids and conjunctivae normal, sclerae anicteric  ENMT: External Ears, Nose appear normal. Grossly normal hearing;  Poor dentition Neck: Appears normal, supple, no cervical masses, normal ROM, no appreciable thyromegaly; no appreciable JVD Respiratory: Diminished to auscultation bilaterally with coarse breath sounds and some slight crackles., no wheezing, rales, rhonchi or crackles. Normal respiratory effort and patient is not tachypenic. No accessory muscle use.  Unlabored breathing and is not wearing any supplemental oxygen via nasal cannula Cardiovascular: RRR, no murmurs / rubs / gallops. S1 and S2 auscultated.  Mild extremity edema.  Abdomen: Soft, non-tender, non-distended. Bowel sounds positive.  GU: Deferred. Musculoskeletal: No clubbing / cyanosis of digits/nails. No joint deformity upper and lower extremities.  Skin: No rashes, lesions, ulcers on limited skin evaluation. No induration; Warm and dry.  Neurologic: CN 2-12 grossly intact with no focal deficits. Romberg  sign and cerebellar reflexes not assessed.  Psychiatric: Normal judgment and insight. Alert and oriented x 3. Slightly anxious mood and appropriate affect.   Data Reviewed: I have personally reviewed following labs and imaging studies  CBC: Recent Labs  Lab 03/27/20 2317 03/27/20 2317 03/28/20 0324 03/28/20 0324 03/29/20 0941 03/30/20 0309 03/30/20 1459 03/30/20 1500 03/31/20 0341  WBC 10.6*  --  13.2*  --  13.7* 12.0*  --   --  11.1*  NEUTROABS 7.5  --   --   --  10.7* 8.3*  --   --  7.3  HGB 12.9*   < > 13.5   < > 12.9* 12.7* 11.9* 12.6* 12.8*  HCT 36.8*   < > 39.5   < >  36.9* 36.5* 35.0* 37.0* 37.3*  MCV 85.6  --  86.4  --  84.6 85.9  --   --  86.5  PLT 319  --  350  --  342 356  --   --  355   < > = values in this interval not displayed.   Basic Metabolic Panel: Recent Labs  Lab 03/27/20 2317 03/27/20 2317 03/28/20 0324 03/28/20 0324 03/29/20 0941 03/30/20 0309 03/30/20 1459 03/30/20 1500 03/31/20 0341  NA 126*   < > 129*   < > 130* 128* 133* 132* 132*  K 3.6   < > 4.0   < > 4.2 4.4 3.9 4.1 4.5  CL 93*  --  96*  --  96* 94*  --   --  93*  CO2 22  --  22  --  26 25  --   --  29  GLUCOSE 344*  --  286*  --  221* 264*  --   --  88  BUN 8  --  9  --  7* 10  --   --  15  CREATININE 0.64  --  0.52*  --  0.57* 0.78  --   --  0.74  CALCIUM 8.5*  --  8.7*  --  8.9 8.6*  --   --  9.3  MG 1.4*  --   --   --  1.6* 2.0  --   --  1.8  PHOS  --   --   --   --  3.6 5.4*  --   --  5.6*   < > = values in this interval not displayed.   GFR: Estimated Creatinine Clearance: 86.1 mL/min (by C-G formula based on SCr of 0.74 mg/dL). Liver Function Tests: Recent Labs  Lab 03/27/20 2317 03/28/20 0324 03/29/20 0941 03/30/20 0309 03/31/20 0341  AST 12* 12* 14* 16 18  ALT 9 10 11 13 16   ALKPHOS 59 62 57 74 69  BILITOT 0.3 0.4 0.7 0.3 0.7  PROT 5.7* 6.0* 5.5* 5.6* 5.9*  ALBUMIN 3.1* 3.1* 2.9* 2.9* 3.1*   Recent Labs  Lab 03/27/20 2317  LIPASE 17   No results for input(s): AMMONIA in the last 168 hours. Coagulation Profile: Recent Labs  Lab 03/28/20 0324  INR 1.1   Cardiac Enzymes: No results for input(s): CKTOTAL, CKMB, CKMBINDEX, TROPONINI in the last 168 hours. BNP (last 3 results) No results for input(s): PROBNP in the last 8760 hours. HbA1C: No results for input(s): HGBA1C in the last 72 hours. CBG: Recent Labs  Lab 03/30/20 1205 03/30/20 1622 03/30/20 2148 03/31/20 0836 03/31/20 1134  GLUCAP 161* 131* 281* 98 137*   Lipid Profile: No results for input(s): CHOL, HDL, LDLCALC, TRIG,  CHOLHDL, LDLDIRECT in the last 72 hours. Thyroid  Function Tests: Recent Labs    03/29/20 0941  TSH 2.465   Anemia Panel: No results for input(s): VITAMINB12, FOLATE, FERRITIN, TIBC, IRON, RETICCTPCT in the last 72 hours. Sepsis Labs: No results for input(s): PROCALCITON, LATICACIDVEN in the last 168 hours.  Recent Results (from the past 240 hour(s))  SARS Coronavirus 2 by RT PCR (hospital order, performed in [facility] hospital lab) Nasopharyngeal Nasopharyngeal Swab     Status: None   Collection Time: 03/28/20  2:20 AM   Specimen: Nasopharyngeal Swab  Result Value Ref Range Status   SARS Coronavirus 2 NEGATIVE NEGATIVE Final    Comment: (NOTE) SARS-CoV-2 target nucleic acids are NOT DETECTED.  The SARS-CoV-2 RNA is generally detectable in upper and lower respiratory specimens during the acute phase of infection. The lowest concentration of SARS-CoV-2 viral copies this assay can detect is 250 copies / mL. A negative result does not preclude SARS-CoV-2 infection and should not be used as the sole basis for treatment or other patient management decisions.  A negative result may occur with improper specimen collection / handling, submission of specimen other than nasopharyngeal swab, presence of viral mutation(s) within the areas targeted by this assay, and inadequate number of viral copies (<250 copies / mL). A negative result must be combined with clinical observations, patient history, and epidemiological information.  Fact Sheet for Patients:   BoilerBrush.com.cy  Fact Sheet for Healthcare Providers: https://pope.com/  This test is not yet approved or  cleared by the [place] FDA and has been authorized for detection and/or diagnosis of SARS-CoV-2 by FDA under an Emergency Use Authorization (EUA).  This EUA will remain in effect (meaning this test can be used) for the duration of the COVID-19 declaration under Section 564(b)(1) of the Act, 21 U.S.C. section  360bbb-3(b)(1), unless the authorization is terminated or revoked sooner.  Performed at [facility], [address], [place], [place] [address]      RN Pressure Injury Documentation:     Estimated body mass index is 23.34 kg/m as calculated from the following:   Height as of this encounter: 5\' 7"  (1.702 m).   Weight as of this encounter: 67.6 kg.  Malnutrition Type:      Malnutrition Characteristics:      Nutrition Interventions:    Radiology Studies: DG Orthopantogram  Result Date: 03/31/2020 CLINICAL DATA:  64 year old male preoperative study. EXAM: ORTHOPANTOGRAM/PANORAMIC COMPARISON:  Head CT 03/28/2020. FINDINGS: Largely absent dentition. There are solitary bilateral posterior maxillary molars still in place. Along the mandible there is a tooth fragment on the right with periapical lucency, probably a bicuspid. No other acute osseous abnormality identified. Maxillary sinuses appear to remain well pneumatized. IMPRESSION: 1. Right mandible tooth fragment with inflammatory periapical lucency. 2. Otherwise there are only solitary posterior maxillary molars. Electronically Signed   By: 03/30/2020 M.D.   On: 03/31/2020 14:58   CARDIAC CATHETERIZATION  Addendum Date: 03/30/2020    Prox RCA to Mid RCA lesion is 80% stenosed.  RPDA lesion is 90% stenosed.  1st Mrg lesion is 100% stenosed.  3rd Mrg lesion is 75% stenosed.  Prox Cx to Mid Cx lesion is 75% stenosed.  Mid LAD lesion is 100% stenosed.  Mid LAD to Dist LAD lesion is 70% stenosed.  2nd Diag lesion is 40% stenosed.  Prox LAD to Mid LAD lesion is 25% stenosed.  LV end diastolic pressure is normal.  There is moderate (3+) aortic regurgitation.  There  is mild left ventricular systolic dysfunction.  The left ventricular ejection fraction is 35-45% by visual estimate.  Right heart results on room air: Ao sat 90%, PA sat 57%, PA pressure 28/12, mean PA 18 mm Hg; mean PCWP 17 mm Hg; CO 4.1 L/min; CI 2.33  Severe three  vessel disease.  Moderate to severe aortic insufficiency. Plan for TEE tomorrow. Will need cardiac surgery consult for CABG and likely AVR. Results conveyed to his son, Aurelio Brash.   Addendum Date: 03/30/2020    Prox RCA to Mid RCA lesion is 80% stenosed.  RPDA lesion is 90% stenosed.  1st Mrg lesion is 100% stenosed.  3rd Mrg lesion is 75% stenosed.  Prox Cx to Mid Cx lesion is 75% stenosed.  Mid LAD lesion is 100% stenosed.  Mid LAD to Dist LAD lesion is 70% stenosed.  2nd Diag lesion is 40% stenosed.  Prox LAD to Mid LAD lesion is 25% stenosed.  LV end diastolic pressure is normal.  There is moderate (3+) aortic regurgitation.  There is mild left ventricular systolic dysfunction.  The left ventricular ejection fraction is 35-45% by visual estimate.  Right heart results on room air: Ao sat 90%, PA sat 57%, PA pressure 28/12, mean PA 18 mm Hg; mean PCWP 17 mm Hg; CO 4.1 L/min; CI 2.33  Severe three vessel disease.  Moderate to severe aortic insufficiency. Plan for TEE tomorrow. Will need cardiac surgery consult for CABG and likely AVR. Results conveyed to his son, Aurelio Brash.   Result Date: 03/30/2020  Prox RCA to Mid RCA lesion is 80% stenosed.  RPDA lesion is 90% stenosed.  1st Mrg lesion is 100% stenosed.  3rd Mrg lesion is 75% stenosed.  Prox Cx to Mid Cx lesion is 75% stenosed.  Mid LAD lesion is 100% stenosed.  Mid LAD to Dist LAD lesion is 70% stenosed.  2nd Diag lesion is 40% stenosed.  Prox LAD to Mid LAD lesion is 25% stenosed.  LV end diastolic pressure is normal.  There is moderate (3+) aortic regurgitation.  There is mild left ventricular systolic dysfunction.  The left ventricular ejection fraction is 35-45% by visual estimate.  Severe three vessel disease.  Moderate to severe aortic insufficiency. Plan for TEE tomorrow. Will need cardiac surgery consult for CABG and likely AVR.   DG CHEST PORT 1 VIEW  Result Date: 03/31/2020 CLINICAL DATA:  Shortness of breath. EXAM: PORTABLE  CHEST 1 VIEW COMPARISON:  Chest x-ray 03/30/2020. FINDINGS: Mediastinum and hilar structures normal. Cardiomegaly. Mild pulmonary venous congestion. Bilateral interstitial prominence again noted. Interstitial edema and/or pneumonitis cannot be excluded. Low lung volumes with bibasilar atelectasis/infiltrates. Small bilateral pleural effusions again noted. No pneumothorax. No acute bony abnormality. 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 IMPRESSION: 1. Cardiomegaly with mild pulmonary venous congestion noted on today's exam. 2. Bilateral interstitial prominence again noted. Interstitial edema and/or pneumonitis cannot be excluded. 3. Low lung volumes with persistent bibasilar atelectasis/infiltrates. Small bilateral pleural effusions again noted. Electronically Signed   By: Maisie Fus  Register   On: 03/31/2020 07:30   DG CHEST PORT 1 VIEW  Result Date: 03/30/2020 CLINICAL DATA:  Short of breath EXAM: PORTABLE CHEST 1 VIEW COMPARISON:  CT 03/29/2020 FINDINGS: Normal cardiac silhouette. There is bilateral lower lobe lower lobe atelectasis and effusion similar to CT 1 day prior. No focal consolidation. No pneumothorax. IMPRESSION: 1. No interval change. 2. Bibasilar atelectasis and effusions. Electronically Signed   By: Genevive Bi M.D.   On: 03/30/2020  06:12   Scheduled Meds: . aspirin EC  81 mg Oral Daily  . atorvastatin  80 mg Oral Daily  . Chlorhexidine Gluconate Cloth  6 each Topical Daily  . cyanocobalamin  1,000 mcg Intramuscular Daily  . feeding supplement (GLUCERNA SHAKE)  237 mL Oral TID BM  . furosemide  40 mg Intravenous BID  . heparin injection (subcutaneous)  5,000 Units Subcutaneous Q8H  . insulin aspart  0-5 Units Subcutaneous QHS  . insulin aspart  0-9 Units Subcutaneous TID WC  . insulin glargine  12 Units Subcutaneous Daily  . losartan  25 mg Oral Daily  . metoprolol tartrate  25 mg Oral BID  . multivitamin with minerals  1 tablet Oral  Daily  . nicotine  21 mg Transdermal Daily  . polyethylene glycol  17 g Oral BID  . senna-docusate  1 tablet Oral BID  . sodium chloride flush  3 mL Intravenous Q12H  . spironolactone  12.5 mg Oral Daily   Continuous Infusions: . sodium chloride    . thiamine injection 250 mg (03/30/20 2344)    LOS: 3 days   Merlene Laughter, DO [facility] PAGER is on AMION  If 7PM-7AM, please contact night-coverage [email or web address]

## 2020-03-31 NOTE — Progress Notes (Signed)
  Echocardiogram Echocardiogram Transesophageal has been performed.  Gerda Diss 03/31/2020, 10:44 AM

## 2020-03-31 NOTE — Anesthesia Procedure Notes (Signed)
Procedure Name: MAC Performed by: Valda Favia, CRNA Pre-anesthesia Checklist: Patient identified, Emergency Drugs available, Suction available, Patient being monitored and Timeout performed Patient Re-evaluated:Patient Re-evaluated prior to induction Oxygen Delivery Method: Nasal cannula Preoxygenation: Pre-oxygenation with 100% oxygen Induction Type: IV induction Airway Equipment and Method: Bite block Placement Confirmation: positive ETCO2 Dental Injury: Teeth and Oropharynx as per pre-operative assessment

## 2020-03-31 NOTE — Consult Note (Signed)
[address]       [place] [address]             [phone]      Cardiothoracic Surgery Consultation  Reason for Consult: Severe multivessel coronary disease, severe aortic insufficiency, moderate to severe mitral regurgitation.  Referring Physician: Dr. Sherian Rein is an 64 y.o. male.  HPI:   The patient is a 64 year old gentleman with a history of untreated and poorly controlled diabetes, hypertension, ongoing heavy smoking and COPD, and previous EtOH abuse who reports being in his usual state of health until last Friday when he tried to get up out of a chair and could not stand on his right leg due to weakness and numbness.  He says that he has never had this symptom before although there is some mention in the cardiology consultation note that he had some symptoms over the previous 2 months.  He denies any other extremity weakness.  He had no difficulty with speech or swallowing.  There were no visual changes.  He came to the emergency department and was hemodynamically stable.  Work-up in the emergency room showed hyponatremia, hyperglycemia, magnesium 1.4, and elevated troponin of 27 and 121.  SARS coronavirus was negative.  His initial chest x-ray showed borderline cardiomegaly with diffuse interstitial thickening suspicious for pulmonary edema.  He had a CT scan of the chest which showed no evidence of aortic dissection.  There is mild ectasia of the ascending aorta at 3.7 cm.  There were moderate bilateral pleural effusions with associated bibasilar atelectasis and some patchy groundglass opacification of the mid upper lungs suggesting possibility of infection or inflammation.  He became acutely short of breath after the CT scan and was given IV Lasix and placed on BiPAP with improvement.  Chest x-ray showed mild diffuse pulmonary interstitial edema.  He had a CT scan of the head which showed no acute abnormality.  A CT of the lumbar spine without  contrast showed no acute abnormality within the lumbar spine but did show multifactorial degenerative changes at L4-5 with resultant severe spinal stenosis.  He was transferred to [facility] and had an MRI of the head which showed chronic microvascular changes but no acute stroke.  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  His symptoms improved gradually over admission and he was walking the hall yesterday with a walker.  He feels like his strength is close to normal.  He had a 2D echocardiogram showing an ejection fraction of 35 to 40% with regional wall motion abnormalities and grade 2 diastolic dysfunction.  There was moderate hypokinesis of the left ventricular, mid apical, inferior septal, and inferior walls.  There is moderate mitral regurgitation.  There was severe aortic insufficiency with possible bicuspid aortic valve.  There is mild aortic stenosis.  The ascending aorta was measured at 38 mm.  Cardiac catheterization showed severe multivessel coronary disease.  The LAD was occluded in its midportion with filling beyond that by collaterals from a large diagonal branch that had about 40 to 50% stenosis.  The left circumflex had 75% proximal stenosis at the takeoff of a first marginal branch that was occluded.  There is a third marginal branch that 75% stenosis.  The right coronary artery had 80% mid vessel stenosis and was diffusely diseased.  There is 90% PDA stenosis.  PA pressure was 28/12.  LVEDP was 14.  TEE today showed an EF of 35 to 40%.  The aortic valve appeared functionally bicuspid with severe AI.  The mitral valve had annular dilatation with at least moderate central MR.  There is a focal eccentric jet with possible cleft or perforation adjacent to P2.  He denies any history of chest pain or pressure.  Had some mild shortness of breath a few days prior to admission.  He denies orthopnea and PND.  He has had some lower extremity edema.  He denies dizziness and syncope.  He  has had exertional fatigue for several months.  The patient does not work.  He lives in [place] with his son.  He has a long history of alcohol abuse but quit about 10 years ago.  He has a long smoking history previous 2 and half pack per day for close to 50 years.  He has cut down over the past year to about 1 pack/day.  He has not seen a dentist or physician in at least 15 years.    Past Medical History:  Diagnosis Date  . Diabetes mellitus without complication (HCC)   . Hypertension     Past Surgical History:  Procedure Laterality Date  . CHOLECYSTECTOMY    . HERNIA REPAIR    . RIGHT/LEFT HEART CATH AND CORONARY ANGIOGRAPHY N/A 03/30/2020   Procedure: RIGHT/LEFT HEART CATH AND CORONARY ANGIOGRAPHY;  Surgeon: Corky Crafts, MD;  Location: [facility] INVASIVE CV LAB;  Service: Cardiovascular;  Laterality: N/A;    Family History  Problem Relation Age of Onset  . CAD Father        died of MI in 19's    Social History:  reports that he has been smoking cigarettes. He has been smoking about 1.00 pack per day. He has never used smokeless tobacco. He reports that he does not drink alcohol and does not use drugs.  Allergies:  Allergies  Allergen Reactions  . Penicillins      Unknown reaction was told it happened as a child    Medications:  I have reviewed the patient's current medications. Prior to Admission:  No medications prior to admission.   Scheduled: . aspirin EC  81 mg Oral Daily  . atorvastatin  80 mg Oral Daily  . Chlorhexidine Gluconate Cloth  6 each Topical Daily  . cyanocobalamin  1,000 mcg Intramuscular Daily  . feeding supplement (GLUCERNA SHAKE)  237 mL Oral TID BM  . furosemide  40 mg Intravenous BID  . heparin injection (subcutaneous)  5,000 Units Subcutaneous Q8H  . insulin aspart  0-5 Units Subcutaneous QHS  . insulin aspart  0-9 Units Subcutaneous TID WC  . insulin glargine  12 Units Subcutaneous Daily  . losartan  25 mg Oral Daily  . metoprolol  tartrate  25 mg Oral BID  . multivitamin with minerals  1 tablet Oral Daily  . nicotine  21 mg Transdermal Daily  . polyethylene glycol  17 g Oral BID  . senna-docusate  1 tablet Oral BID  . sodium chloride flush  3 mL Intravenous Q12H  . spironolactone  12.5 mg Oral Daily   Continuous: . sodium chloride    . thiamine injection 250 mg (03/30/20 2344)   GNF:AOZHYQ chloride, acetaminophen, ondansetron (ZOFRAN) IV, sodium chloride flush Anti-infectives (From admission, onward)   None      Results for orders placed or performed during the hospital encounter of 03/27/20 (from the past 48 hour(s))  Glucose, capillary     Status: Abnormal   Collection Time: 03/29/20  4:14 PM  Result Value Ref  Range   Glucose-Capillary 165 (H) 70 - 99 mg/dL    Comment: Glucose reference range applies only to samples taken after fasting for at least 8 hours.  Glucose, capillary     Status: Abnormal   Collection Time: 03/29/20  9:35 PM  Result Value Ref Range   Glucose-Capillary 264 (H) 70 - 99 mg/dL    Comment: Glucose reference range applies only to samples taken after fasting for at least 8 hours.  CBC with Differential/Platelet     Status: Abnormal   Collection Time: 03/30/20  3:09 AM  Result Value Ref Range   WBC 12.0 (H) 4.0 - 10.5 K/uL   RBC 4.25 4.22 - 5.81 MIL/uL   Hemoglobin 12.7 (L) 13.0 - 17.0 g/dL   HCT 16.1 (L) 39 - 52 %   MCV 85.9 80.0 - 100.0 fL   MCH 29.9 26.0 - 34.0 pg   MCHC 34.8 30.0 - 36.0 g/dL   RDW 09.6 04.5 - 40.9 %   Platelets 356 150 - 400 K/uL   nRBC 0.0 0.0 - 0.2 %   Neutrophils Relative % 68 %   Neutro Abs 8.3 (H) 1.7 - 7.7 K/uL   Lymphocytes Relative 20 %   Lymphs Abs 2.4 0.7 - 4.0 K/uL   Monocytes Relative 8 %   Monocytes Absolute 0.9 0 - 1 K/uL   Eosinophils Relative 3 %   Eosinophils Absolute 0.3 0 - 0 K/uL   Basophils Relative 1 %   Basophils Absolute 0.1 0 - 0 K/uL   Immature Granulocytes 0 %   Abs Immature Granulocytes 0.05 0.00 - 0.07 K/uL    Comment:  Performed at [facility], [address], [place], [place] [address]  Comprehensive metabolic panel     Status: Abnormal   Collection Time: 03/30/20  3:09 AM  Result Value Ref Range   Sodium 128 (L) 135 - 145 mmol/L   Potassium 4.4 3.5 - 5.1 mmol/L   Chloride 94 (L) 98 - 111 mmol/L   CO2 25 22 - 32 mmol/L   Glucose, Bld 264 (H) 70 - 99 mg/dL    Comment: Glucose reference range applies only to samples taken after fasting for at least 8 hours.   BUN 10 8 - 23 mg/dL   Creatinine, Ser 4.78 0.61 - 1.24 mg/dL   Calcium 8.6 (L) 8.9 - 10.3 mg/dL   Total Protein 5.6 (L) 6.5 - 8.1 g/dL   Albumin 2.9 (L) 3.5 - 5.0 g/dL   AST 16 15 - 41 U/L   ALT 13 0 - 44 U/L   Alkaline Phosphatase 74 38 - 126 U/L   Total Bilirubin 0.3 0.3 - 1.2 mg/dL   GFR calc non Af Amer >60 >60 mL/min   GFR calc Af Amer >60 >60 mL/min   Anion gap 9 5 - 15    Comment: Performed at [facility], [address], [place], [place] [address]  Magnesium     Status: None   Collection Time: 03/30/20  3:09 AM  Result Value Ref Range   Magnesium 2.0 1.7 - 2.4 mg/dL    Comment: Performed at [facility], [address], [place], [place] [address]  Phosphorus     Status: Abnormal   Collection Time: 03/30/20  3:09 AM  Result Value Ref Range   Phosphorus 5.4 (H) 2.5 - 4.6 mg/dL    Comment: Performed at [facility], [address], [place], [place] [address]  Glucose,  capillary     Status: Abnormal   Collection Time: 03/30/20  8:25 AM  Result Value Ref Range   Glucose-Capillary 228 (H) 70 - 99 mg/dL    Comment: Glucose reference range applies only to samples taken after fasting for at least 8 hours.  Glucose, capillary     Status: Abnormal   Collection Time: 03/30/20 12:05 PM  Result Value Ref Range   Glucose-Capillary 161 (H) 70 - 99 mg/dL    Comment: Glucose reference range applies only to samples taken after fasting for at least 8 hours.  I-STAT 7, (LYTES, BLD GAS, ICA, H+H)     Status: Abnormal    Collection Time: 03/30/20  2:59 PM  Result Value Ref Range   pH, Arterial 7.427 7.35 - 7.45   pCO2 arterial 40.7 32 - 48 mmHg   pO2, Arterial 58 (L) 83 - 108 mmHg   Bicarbonate 26.8 20.0 - 28.0 mmol/L   TCO2 28 22 - 32 mmol/L   O2 Saturation 90.0 %   Acid-Base Excess 2.0 0.0 - 2.0 mmol/L   Sodium 133 (L) 135 - 145 mmol/L   Potassium 3.9 3.5 - 5.1 mmol/L   Calcium, Ion 1.13 (L) 1.15 - 1.40 mmol/L   HCT 35.0 (L) 39 - 52 %   Hemoglobin 11.9 (L) 13.0 - 17.0 g/dL   Sample type ARTERIAL   POCT I-Stat EG7     Status: Abnormal   Collection Time: 03/30/20  3:00 PM  Result Value Ref Range   pH, Ven 7.402 7.25 - 7.43   pCO2, Ven 46.5 44 - 60 mmHg   pO2, Ven 30.0 (LL) 32 - 45 mmHg   Bicarbonate 28.9 (H) 20.0 - 28.0 mmol/L   TCO2 30 22 - 32 mmol/L   O2 Saturation 57.0 %   Acid-Base Excess 3.0 (H) 0.0 - 2.0 mmol/L   Sodium 132 (L) 135 - 145 mmol/L   Potassium 4.1 3.5 - 5.1 mmol/L   Calcium, Ion 1.21 1.15 - 1.40 mmol/L   HCT 37.0 (L) 39 - 52 %   Hemoglobin 12.6 (L) 13.0 - 17.0 g/dL   Sample type MIXED VENOUS SAMPLE   Glucose, capillary     Status: Abnormal   Collection Time: 03/30/20  4:22 PM  Result Value Ref Range   Glucose-Capillary 131 (H) 70 - 99 mg/dL    Comment: Glucose reference range applies only to samples taken after fasting for at least 8 hours.  Glucose, capillary     Status: Abnormal   Collection Time: 03/30/20  9:48 PM  Result Value Ref Range   Glucose-Capillary 281 (H) 70 - 99 mg/dL    Comment: Glucose reference range applies only to samples taken after fasting for at least 8 hours.  Comprehensive metabolic panel     Status: Abnormal   Collection Time: 03/31/20  3:41 AM  Result Value Ref Range   Sodium 132 (L) 135 - 145 mmol/L   Potassium 4.5 3.5 - 5.1 mmol/L   Chloride 93 (L) 98 - 111 mmol/L   CO2 29 22 - 32 mmol/L   Glucose, Bld 88 70 - 99 mg/dL    Comment: Glucose reference range applies only to samples taken after fasting for at least 8 hours.   BUN 15 8 - 23  mg/dL   Creatinine, Ser 1.61 0.61 - 1.24 mg/dL   Calcium 9.3 8.9 - 09.6 mg/dL   Total Protein 5.9 (L) 6.5 - 8.1 g/dL   Albumin 3.1 (L) 3.5 - 5.0 g/dL  AST 18 15 - 41 U/L   ALT 16 0 - 44 U/L   Alkaline Phosphatase 69 38 - 126 U/L   Total Bilirubin 0.7 0.3 - 1.2 mg/dL   GFR calc non Af Amer >60 >60 mL/min   GFR calc Af Amer >60 >60 mL/min   Anion gap 10 5 - 15    Comment: Performed at [facility], [address] [place], [place] [address]  CBC with Differential/Platelet     Status: Abnormal   Collection Time: 03/31/20  3:41 AM  Result Value Ref Range   WBC 11.1 (H) 4.0 - 10.5 K/uL   RBC 4.31 4.22 - 5.81 MIL/uL   Hemoglobin 12.8 (L) 13.0 - 17.0 g/dL   HCT 60.4 (L) 39 - 52 %   MCV 86.5 80.0 - 100.0 fL   MCH 29.7 26.0 - 34.0 pg   MCHC 34.3 30.0 - 36.0 g/dL   RDW 54.0 98.1 - 19.1 %   Platelets 355 150 - 400 K/uL   nRBC 0.0 0.0 - 0.2 %   Neutrophils Relative % 65 %   Neutro Abs 7.3 1.7 - 7.7 K/uL   Lymphocytes Relative 21 %   Lymphs Abs 2.3 0.7 - 4.0 K/uL   Monocytes Relative 9 %   Monocytes Absolute 1.0 0 - 1 K/uL   Eosinophils Relative 4 %   Eosinophils Absolute 0.4 0 - 0 K/uL   Basophils Relative 1 %   Basophils Absolute 0.1 0 - 0 K/uL   Immature Granulocytes 0 %   Abs Immature Granulocytes 0.04 0.00 - 0.07 K/uL    Comment: Performed at [facility], [address], [place], [place] [address]  Magnesium     Status: None   Collection Time: 03/31/20  3:41 AM  Result Value Ref Range   Magnesium 1.8 1.7 - 2.4 mg/dL    Comment: Performed at [facility], [address], [place], [place] [address]  Phosphorus     Status: Abnormal   Collection Time: 03/31/20  3:41 AM  Result Value Ref Range   Phosphorus 5.6 (H) 2.5 - 4.6 mg/dL    Comment: Performed at [facility], [address], [place], [place] [address]  Glucose, capillary     Status: None   Collection Time: 03/31/20  8:36 AM  Result Value Ref Range   Glucose-Capillary 98 70 - 99 mg/dL     Comment: Glucose reference range applies only to samples taken after fasting for at least 8 hours.  Glucose, capillary     Status: Abnormal   Collection Time: 03/31/20 11:34 AM  Result Value Ref Range   Glucose-Capillary 137 (H) 70 - 99 mg/dL    Comment: Glucose reference range applies only to samples taken after fasting for at least 8 hours.    CARDIAC CATHETERIZATION  Addendum Date: 03/30/2020    Prox RCA to Mid RCA lesion is 80% stenosed.  RPDA lesion is 90% stenosed.  1st Mrg lesion is 100% stenosed.  3rd Mrg lesion is 75% stenosed.  Prox Cx to Mid Cx lesion is 75% stenosed.  Mid LAD lesion is 100% stenosed.  Mid LAD to Dist LAD lesion is 70% stenosed.  2nd Diag lesion is 40% stenosed.  Prox LAD to Mid LAD lesion is 25% stenosed.  LV end diastolic pressure is normal.  There is moderate (3+) aortic regurgitation.  There is mild left ventricular systolic dysfunction.  The left ventricular ejection fraction is 35-45% by visual  estimate.  Right heart results on room air: Ao sat 90%, PA sat 57%, PA pressure 28/12, mean PA 18 mm Hg; mean PCWP 17 mm Hg; CO 4.1 L/min; CI 2.33  Severe three vessel disease.  Moderate to severe aortic insufficiency. Plan for TEE tomorrow. Will need cardiac surgery consult for CABG and likely AVR. Results conveyed to his son, Aurelio Brash.   Addendum Date: 03/30/2020    Prox RCA to Mid RCA lesion is 80% stenosed.  RPDA lesion is 90% stenosed.  1st Mrg lesion is 100% stenosed.  3rd Mrg lesion is 75% stenosed.  Prox Cx to Mid Cx lesion is 75% stenosed.  Mid LAD lesion is 100% stenosed.  Mid LAD to Dist LAD lesion is 70% stenosed.  2nd Diag lesion is 40% stenosed.  Prox LAD to Mid LAD lesion is 25% stenosed.  LV end diastolic pressure is normal.  There is moderate (3+) aortic regurgitation.  There is mild left ventricular systolic dysfunction.  The left ventricular ejection fraction is 35-45% by visual estimate.  Right heart results on room air: Ao sat 90%, PA  sat 57%, PA pressure 28/12, mean PA 18 mm Hg; mean PCWP 17 mm Hg; CO 4.1 L/min; CI 2.33  Severe three vessel disease.  Moderate to severe aortic insufficiency. Plan for TEE tomorrow. Will need cardiac surgery consult for CABG and likely AVR. Results conveyed to his son, Aurelio Brash.   Result Date: 03/30/2020  Prox RCA to Mid RCA lesion is 80% stenosed.  RPDA lesion is 90% stenosed.  1st Mrg lesion is 100% stenosed.  3rd Mrg lesion is 75% stenosed.  Prox Cx to Mid Cx lesion is 75% stenosed.  Mid LAD lesion is 100% stenosed.  Mid LAD to Dist LAD lesion is 70% stenosed.  2nd Diag lesion is 40% stenosed.  Prox LAD to Mid LAD lesion is 25% stenosed.  LV end diastolic pressure is normal.  There is moderate (3+) aortic regurgitation.  There is mild left ventricular systolic dysfunction.  The left ventricular ejection fraction is 35-45% by visual estimate.  Severe three vessel disease.  Moderate to severe aortic insufficiency. Plan for TEE tomorrow. Will need cardiac surgery consult for CABG and likely AVR.   DG CHEST PORT 1 VIEW  Result Date: 03/31/2020 CLINICAL DATA:  Shortness of breath. EXAM: PORTABLE CHEST 1 VIEW COMPARISON:  Chest x-ray 03/30/2020. FINDINGS: Mediastinum and hilar structures normal. Cardiomegaly. Mild pulmonary venous congestion. Bilateral interstitial prominence again noted. Interstitial edema and/or pneumonitis cannot be excluded. Low lung volumes with bibasilar atelectasis/infiltrates. Small bilateral pleural effusions again noted. No pneumothorax. No acute bony abnormality. 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 IMPRESSION: 1. Cardiomegaly with mild pulmonary venous congestion noted on today's exam. 2. Bilateral interstitial prominence again noted. Interstitial edema and/or pneumonitis cannot be excluded. 3. Low lung volumes with persistent bibasilar atelectasis/infiltrates. Small bilateral pleural effusions again noted. Electronically  Signed   By: Maisie Fus  Register   On: 03/31/2020 07:30   DG CHEST PORT 1 VIEW  Result Date: 03/30/2020 CLINICAL DATA:  Short of breath EXAM: PORTABLE CHEST 1 VIEW COMPARISON:  CT 03/29/2020 FINDINGS: Normal cardiac silhouette. There is bilateral lower lobe lower lobe atelectasis and effusion similar to CT 1 day prior. No focal consolidation. No pneumothorax. IMPRESSION: 1. No interval change. 2. Bibasilar atelectasis and effusions. Electronically Signed   By: Genevive Bi M.D.   On: 03/30/2020 06:12    Review of Systems  Constitutional: Positive for activity change and fatigue.  HENT: Positive for dental problem.        Has not seen a dentist at least 15 years  Eyes: Negative.   Respiratory: Positive for cough and shortness of breath. Negative for chest tightness.   Cardiovascular: Positive for leg swelling. Negative for chest pain and palpitations.  Gastrointestinal: Negative.   Endocrine: Negative.   Genitourinary: Negative.   Allergic/Immunologic: Negative.   Neurological: Positive for weakness. Negative for dizziness, syncope, facial asymmetry, speech difficulty and headaches.       Acute right lower extremity weakness and numbness prior to admission  Hematological: Negative.   Psychiatric/Behavioral: Negative.    Blood pressure (!) 104/51, pulse (!) 130, temperature 98.3 F (36.8 C), temperature source Oral, resp. rate 18, height 5\' 7"  (1.702 m), weight 67.6 kg, SpO2 97 %. Physical Exam Constitutional:      Appearance: Normal appearance. He is normal weight.  HENT:     Head: Normocephalic and atraumatic.     Mouth/Throat:     Comments: Poor dentition with several broken off teeth remaining Eyes:     Extraocular Movements: Extraocular movements intact.     Conjunctiva/sclera: Conjunctivae normal.     Pupils: Pupils are equal, round, and reactive to light.  Neck:     Vascular: No carotid bruit.  Cardiovascular:     Rate and Rhythm: Normal rate and regular rhythm.      Heart sounds: Murmur heard.      Comments: 2/6 systolic murmur along the left lower sternal border.  Faint diastolic AI murmur. Pulmonary:     Effort: Pulmonary effort is normal.     Breath sounds: Normal breath sounds. No wheezing, rhonchi or rales.  Abdominal:     General: Abdomen is flat. Bowel sounds are normal. There is no distension.     Palpations: Abdomen is soft.     Tenderness: There is no abdominal tenderness.  Musculoskeletal:        General: Normal range of motion.     Cervical back: Neck supple. No tenderness.     Comments: Mild bilateral ankle swelling greater on the left.  Skin:    General: Skin is warm and dry.  Neurological:     General: No focal deficit present.     Mental Status: He is alert and oriented to person, place, and time.  Psychiatric:        Mood and Affect: Mood normal.        Behavior: Behavior normal.   Physicians  Panel Physicians Referring Physician Case Authorizing Physician  Corky Crafts, MD (Primary)    Procedures  RIGHT/LEFT HEART CATH AND CORONARY ANGIOGRAPHY  Conclusion    Prox RCA to Mid RCA lesion is 80% stenosed.  RPDA lesion is 90% stenosed.  1st Mrg lesion is 100% stenosed.  3rd Mrg lesion is 75% stenosed.  Prox Cx to Mid Cx lesion is 75% stenosed.  Mid LAD lesion is 100% stenosed.  Mid LAD to Dist LAD lesion is 70% stenosed.  2nd Diag lesion is 40% stenosed.  Prox LAD to Mid LAD lesion is 25% stenosed.  LV end diastolic pressure is normal.  There is moderate (3+) aortic regurgitation.  There is mild left ventricular systolic dysfunction.  The left ventricular ejection fraction is 35-45% by visual estimate.  Right heart results on room air: Ao sat 90%, PA sat 57%, PA pressure 28/12, mean PA 18 mm Hg; mean PCWP 17 mm Hg; CO 4.1 L/min; CI 2.33   Severe three vessel disease.  Moderate  to severe aortic insufficiency.  Plan for TEE tomorrow.   Will need cardiac surgery consult for CABG and likely  AVR.  Results conveyed to his son, Aurelio Brash.  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  The patient verbalized understanding and wanted to proceed.  Informed written consent was obtained.  PROCEDURE TECHNIQUE:  After Xylocaine anesthesia, a 5 French sheath was placed in the right antecubital area in exchange for a peripheral vein. A 5 French balloontipped Swan-Ganz catheter was advanced to the pulmonary artery under fluoroscopic guidance. Hemodynamic pressures were obtained. Oxygen saturations were obtained. After Xylocaine anesthesia, a 46F sheath was placed in the right radial artery with a single anterior needle wall stick.   Left coronary angiography was done using a Judkins L4 guide catheter.  Left heart cath and Right coronary angiography was done using a Judkins R4 guide catheter.   Supravalvular aortography was done using a pigtail catheter and power injection.      Contrast: 80 cc  Estimated blood loss <50 mL.   During this procedure medications were administered to achieve and maintain moderate conscious sedation while the patient's heart rate, blood pressure, and oxygen saturation were continuously monitored and I was present face-to-face 100% of this time.  Medications (Filter: Administrations occurring from 1430 to 1528 on 03/30/20) (important) Continuous medications are totaled by the amount administered until 03/30/20 1528.  midazolam (VERSED) injection (mg) Total dose:  3 mg Date/Time  Rate/Dose/Volume Action  03/30/20 1444  2 mg Given  1502  1 mg Given    fentaNYL (SUBLIMAZE) injection (mcg) Total dose:  50 mcg Date/Time  Rate/Dose/Volume Action  03/30/20 1445  25 mcg Given    1502  25 mcg Given    Heparin (Porcine) in NaCl 1000-0.9 UT/500ML-% SOLN (mL) Total volume:  1,000 mL Date/Time  Rate/Dose/Volume Action  03/30/20 1445  500 mL Given  1445  500 mL Given    lidocaine (PF) (XYLOCAINE) 1 % injection (mL) Total volume:  2 mL Date/Time  Rate/Dose/Volume Action  03/30/20 1449  2 mL Given    Radial Cocktail/Verapamil only (mL) Total volume:  10 mL Date/Time  Rate/Dose/Volume Action  03/30/20 1452  10 mL Given    heparin sodium (porcine) injection (Units) Total dose:  3,500 Units Date/Time  Rate/Dose/Volume Action  03/30/20 1500  3,500 Units Given    iohexol (OMNIPAQUE) 350 MG/ML injection (mL) Total volume:  80 mL Date/Time  Rate/Dose/Volume Action  03/30/20 1512  80 mL Given    aspirin EC tablet 81 mg (mg) Total dose:  Cannot be calculated* Dosing weight:  66.9 *Administration dose not documented Date/Time  Rate/Dose/Volume Action  03/30/20 1439  *Not included in total MAR Hold    atorvastatin (LIPITOR) tablet 80 mg (mg) Total dose:  Cannot be calculated* Dosing weight:  66.9 *Administration dose not documented Date/Time  Rate/Dose/Volume Action  03/30/20 1439  *Not included in total MAR Hold    Chlorhexidine Gluconate Cloth 2 % PADS 6 each (each) Total dose:  Cannot be calculated* Dosing weight:  67.2 *Administration dose not documented Date/Time  Rate/Dose/Volume Action  03/30/20 1439  *Not included in total MAR Hold    cyanocobalamin ((VITAMIN B-12)) injection 1,000 mcg (mcg) Total dose:  Cannot be calculated* Dosing weight:  77.1 *Administration dose not documented Date/Time  Rate/Dose/Volume Action  03/30/20 1439  *Not included in total MAR Hold    feeding supplement (GLUCERNA SHAKE) (GLUCERNA SHAKE) liquid 237 mL (mL) Total dose:  Cannot be calculated* Dosing weight:  77.1 *Administration dose not documented Date/Time  Rate/Dose/Volume Action  03/30/20 1439  *Not included in total MAR Hold    furosemide (LASIX) injection 40  mg (mg) Total dose:  Cannot be calculated* Dosing weight:  67.2 *Administration dose not documented Date/Time  Rate/Dose/Volume Action  03/30/20 1439  *Not included in total MAR Hold    heparin injection 5,000 Units (Units) Total dose:  Cannot be calculated* Dosing weight:  66.9 *Administration dose not documented Date/Time  Rate/Dose/Volume Action  03/30/20 1439  *Not included in total MAR Hold    insulin aspart (novoLOG) injection 0-5 Units (Units) Total dose:  Cannot be calculated* Dosing weight:  66.9 *Administration dose not documented Date/Time  Rate/Dose/Volume Action  03/30/20 1439  *Not included in total MAR Hold    insulin aspart (novoLOG) injection 0-9 Units (Units) Total dose:  Cannot be calculated* Dosing weight:  66.9 *Administration dose not documented Date/Time  Rate/Dose/Volume Action  03/30/20 1439  *Not included in total MAR Hold    insulin glargine (LANTUS) injection 12 Units (Units) Total dose:  Cannot be calculated* Dosing weight:  67.6 *Administration dose not documented Date/Time  Rate/Dose/Volume Action  03/30/20 1439  *Not included in total MAR Hold    losartan (COZAAR) tablet 25 mg (mg) Total dose:  Cannot be calculated* Dosing weight:  67.2 *Administration dose not documented Date/Time  Rate/Dose/Volume Action  03/30/20 1439  *Not included in total MAR Hold    metoprolol tartrate (LOPRESSOR) tablet 25 mg (mg) Total dose:  Cannot be calculated* Dosing weight:  67.6 *Administration dose not documented Date/Time  Rate/Dose/Volume Action  03/30/20 1439  *Not included in total MAR Hold    multivitamin with minerals tablet 1 tablet (tablet) Total dose:  Cannot be calculated* Dosing weight:  77.1 *Administration dose not documented Date/Time  Rate/Dose/Volume Action  03/30/20 1439  *Not included in total MAR Hold  1500  *Not included in total Automatically Held    nicotine (NICODERM CQ - dosed in mg/24 hours) patch 21 mg (mg) Total dose:  Cannot  be calculated* Dosing weight:  77.1 *Administration dose not documented Date/Time  Rate/Dose/Volume Action  03/30/20 1439  *Not included in total MAR Hold    polyethylene glycol (MIRALAX / GLYCOLAX) packet 17 g (g) Total dose:  Cannot be calculated* Dosing weight:  67.2 *Administration dose not documented Date/Time  Rate/Dose/Volume Action  03/30/20 1439  *Not included in total MAR Hold    senna-docusate (Senokot-S) tablet 1 tablet (tablet) Total dose:  Cannot be calculated* Dosing weight:  67.2 *Administration dose not documented Date/Time  Rate/Dose/Volume Action  03/30/20 1439  *Not included in total MAR Hold    spironolactone (ALDACTONE) tablet 12.5 mg (mg) Total dose:  Cannot be calculated* Dosing weight:  67.2 *Administration dose not documented Date/Time  Rate/Dose/Volume Action  03/30/20 1439  *Not included in total MAR Hold  1500  *Not included in total Automatically Held    thiamine (B-1) 250 mg in sodium chloride 0.9 % 50 mL IVPB (mL/hr) Total dose:  Cannot be calculated* Dosing weight:  77.1 *Administration dose not documented Date/Time  Rate/Dose/Volume Action  03/30/20 1439  *Not included in total MAR Hold    acetaminophen (TYLENOL) tablet 650 mg (mg) Total dose:  Cannot be calculated* Dosing weight:  77.1 *  Administration dose not documented Date/Time  Rate/Dose/Volume Action  03/30/20 1439  *Not included in total MAR Hold    Sedation Time  Sedation Time Physician-1: 26 minutes 42 seconds  Contrast  Medication Name Total Dose  iohexol (OMNIPAQUE) 350 MG/ML injection 80 mL    Radiation/Fluoro  Fluoro time: 3.6 (min) DAP: 19246 (mGycm2) Cumulative Air Kerma: 325 (mGy)  Complications  Complications documented before study signed (03/30/2020 3:54 PM)   No complications were associated with this study.  Documented by Corky Crafts, MD - 03/30/2020 3:35 PM    Coronary Findings  Diagnostic Dominance: Right Left Anterior Descending    Collaterals  Dist LAD filled by collaterals from RPDA.    Collaterals  Dist LAD filled by collaterals from 2nd Diag.    Prox LAD to Mid LAD lesion is 25% stenosed.  Mid LAD lesion is 100% stenosed. The lesion is chronically occluded with right-to-left and left-to-left collateral flow.  Mid LAD to Dist LAD lesion is 70% stenosed.  Second Diagonal Branch  2nd Diag lesion is 40% stenosed.  Left Circumflex  Prox Cx to Mid Cx lesion is 75% stenosed.  First Obtuse Marginal Branch  1st Mrg lesion is 100% stenosed.  Third Obtuse Marginal Branch  3rd Mrg lesion is 75% stenosed.  Right Coronary Artery  There is moderate diffuse disease throughout the vessel.  Prox RCA to Mid RCA lesion is 80% stenosed. The lesion is moderately calcified.  Right Posterior Descending Artery  RPDA lesion is 90% stenosed.  Intervention  No interventions have been documented. Right Heart  Right Heart Pressures Right heart results on room air: Ao sat 90%, PA sat 57%, PA pressure 28/12, mean PA 18 mm Hg; mean PCWP 17 mm Hg; CO 4.1 L/min; CI 2.33  Left Heart  Left Ventricle The left ventricular size is normal. There is mild left ventricular systolic dysfunction. LV end diastolic pressure is normal. The left ventricular ejection fraction is 35-45% by visual estimate.  Aortic Valve There is moderate (3+) aortic regurgitation.  Coronary Diagrams  Diagnostic Dominance: Right  Intervention  Implants   No implant documentation for this case.  Syngo Images  Show images for CARDIAC CATHETERIZATION Images on Long Term Storage  Show images for Race, Latour to Procedure Log  Procedure Log    Hemo Data   Most Recent Value  Fick Cardiac Output 4.17 L/min  Fick Cardiac Output Index 2.33 (L/min)/BSA  RA A Wave 5 mmHg  RA V Wave 3 mmHg  RA Mean 2 mmHg  RV Systolic Pressure 31 mmHg  RV Diastolic Pressure 1 mmHg  RV EDP 5 mmHg  PA Systolic Pressure 28 mmHg  PA Diastolic Pressure 12 mmHg  PA  Mean 18 mmHg  PW A Wave 18 mmHg  PW V Wave 25 mmHg  PW Mean 17 mmHg  AO Systolic Pressure 89 mmHg  AO Diastolic Pressure 43 mmHg  AO Mean 61 mmHg  LV Systolic Pressure 94 mmHg  LV Diastolic Pressure 3 mmHg  LV EDP 14 mmHg  AOp Systolic Pressure 89 mmHg  AOp Diastolic Pressure 43 mmHg  AOp Mean Pressure 61 mmHg  LVp Systolic Pressure 96 mmHg  LVp Diastolic Pressure 5 mmHg  LVp EDP Pressure 16 mmHg  QP/QS 1  TPVR Index 7.72 HRUI  TSVR Index 26.18 HRUI  PVR SVR Ratio 0.02  TPVR/TSVR Ratio 0.3    ECHOCARDIOGRAM REPORT       Patient Name:  DEBRA CALABRETTA Date of Exam: 03/28/2020  Medical  Rec #: [id number]   Height:    67.0 in  Accession #:  [id number]   Weight:    170.0 lb  Date of Birth: 06-26-1956   BSA:     1.887 m  Patient Age:  64 years    BP:      128/68 mmHg  Patient Gender: M       HR:      89 bpm.  Exam Location: Inpatient   Procedure: 2D Echo   STAT ECHO   Indications:  elevated troponin    History:    Patient has no prior history of Echocardiogram  examinations.         Signs/Symptoms:elevated troponin; Risk  Factors:Hypertension,         Diabetes and Current Smoker.    Sonographer:  Delcie Roch RDCS  Referring Phys: [id number] OLADAPO ADEFESO   IMPRESSIONS    1. Left ventricular ejection fraction, by estimation, is 35 to 40%. The  left ventricle has moderately decreased function. The left ventricle  demonstrates regional wall motion abnormalities (see scoring  diagram/findings for description). The left  ventricular internal cavity size was mildly dilated. There is mild  concentric left ventricular hypertrophy. Left ventricular diastolic  parameters are consistent with Grade II diastolic dysfunction  (pseudonormalization). There is moderate hypokinesis of  the left ventricular, mid-apical inferoseptal wall and inferior wall.  There is severe hypokinesis of the left  ventricular, mid-apical  anteroseptal wall.  2. Right ventricular systolic function is normal. The right ventricular  size is normal.  3. Left atrial size was severely dilated.  4. Right atrial size was mildly dilated.  5. Likely moderate eccentric MR.. The mitral valve is abnormal. Moderate  mitral valve regurgitation.  6. Fixed right coronary cusp with prominent focal calcification. Suspect  bicuspid valve, but obscured by significant calcification. Severe aortic  regurgitation, best seen on apical images, with mitral valve anterior  leaflet restriction secondary to jet.  Vena contracta 0.65 cm on 5 chamber apical, 0.80 cm on 3 chamber apical,  fills most of LVOT and projects into majority of LV cavity (see image 83).  Some diastolic flow reversal seen in descending aorta but not well  visualized.. The aortic valve has an  indeterminant number of cusps. Aortic valve regurgitation is severe. Mild  aortic valve stenosis.  7. Aortic dilatation noted. There is borderline dilatation of the  ascending aorta measuring 38 mm.  8. The inferior vena cava is dilated in size with <50% respiratory  variability, suggesting right atrial pressure of 15 mmHg.   Comparison(s): No prior Echocardiogram.   Conclusion(s)/Recommendation(s): Abnormal study. Reduced EF with both  global and focal hypokinesis; mild global hypokinesis, with moderate wall  motion abnormalities in the mid to apical inferoseptal and inferior wall,  with severe hypokinesis in the mid  to apical anteroseptal walls. Also with severe aortic regurgitation,  calcified and likely bicuspid aortic valve with mild stenosis.   FINDINGS  Left Ventricle: Left ventricular ejection fraction, by estimation, is 35  to 40%. The left ventricle has moderately decreased function. The left  ventricle demonstrates regional wall motion abnormalities. Moderate  hypokinesis of the left ventricular,  mid-apical inferoseptal wall and inferior  wall. Severe hypokinesis of the  left ventricular, mid-apical anteroseptal wall. The left ventricular  internal cavity size was mildly dilated. There is mild concentric left  ventricular hypertrophy. Left  ventricular diastolic parameters are consistent with Grade II diastolic  dysfunction (pseudonormalization).     LV Wall Scoring:  The  mid anteroseptal segment is akinetic. The mid and distal inferior  wall,  basal anteroseptal segment, mid inferoseptal segment, apical septal  segment,  and apex are hypokinetic. The entire anterior wall, entire lateral wall,  basal inferior segment, and basal inferoseptal segment are normal.   Right Ventricle: The right ventricular size is normal. No increase in  right ventricular wall thickness. Right ventricular systolic function is  normal.   Left Atrium: Left atrial size was severely dilated.   Right Atrium: Right atrial size was mildly dilated.   Pericardium: There is no evidence of pericardial effusion.   Mitral Valve: Likely moderate eccentric MR. The mitral valve is abnormal.  Moderate mitral annular calcification. Moderate mitral valve  regurgitation.   Tricuspid Valve: The tricuspid valve is normal in structure. Tricuspid  valve regurgitation is trivial. No evidence of tricuspid stenosis.   Aortic Valve: Fixed right coronary cusp with prominent focal  calcification. Suspect bicuspid valve, but obscured by significant  calcification. Severe aortic regurgitation, best seen on apical images,  with mitral valve anterior leaflet restriction  secondary to jet. Vena contracta 0.65 cm on 5 chamber apical, 0.80 cm on 3  chamber apical, fills most of LVOT and projects into majority of LV cavity  (see image 83). Some diastolic flow reversal seen in descending aorta but  not well visualized. The  aortic valve has an indeterminant number of cusps. . There is moderate  thickening and moderate calcification of the aortic valve. Aortic valve    regurgitation is severe. Aortic regurgitation PHT measures 266 msec. Mild  aortic stenosis is present. There is  moderate thickening of the aortic valve. There is moderate calcification  of the aortic valve. Aortic valve mean gradient measures 12.0 mmHg. Aortic  valve peak gradient measures 22.3 mmHg. Aortic valve area, by VTI measures  1.76 cm.   Pulmonic Valve: The pulmonic valve was not well visualized. Pulmonic valve  regurgitation is not visualized.   Aorta: Aortic dilatation noted. There is borderline dilatation of the  ascending aorta measuring 38 mm.   Venous: The inferior vena cava is dilated in size with less than 50%  respiratory variability, suggesting right atrial pressure of 15 mmHg.   IAS/Shunts: No atrial level shunt detected by color flow Doppler.     LEFT VENTRICLE  PLAX 2D  LVIDd:     5.54 cm   Diastology  LVIDs:     4.40 cm   LV e' lateral:  5.00 cm/s  LV PW:     1.23 cm   LV E/e' lateral: 21.4  LV IVS:    1.18 cm  LVOT diam:   2.10 cm  LV SV:     70  LV SV Index:  37  LVOT Area:   3.46 cm    LV Volumes (MOD)  LV vol d, MOD A2C: 117.0 ml  LV vol d, MOD A4C: 106.0 ml  LV vol s, MOD A2C: 75.0 ml  LV vol s, MOD A4C: 83.4 ml  LV SV MOD A2C:   42.0 ml  LV SV MOD A4C:   106.0 ml  LV SV MOD BP:   32.1 ml   RIGHT VENTRICLE  RV S prime:   13.20 cm/s  TAPSE (M-mode): 2.7 cm   LEFT ATRIUM       Index    RIGHT ATRIUM      Index  LA diam:    4.30 cm 2.28 cm/m RA Area:   14.80 cm  LA Vol (A2C):  57.4  ml 30.41 ml/m RA Volume:  35.30 ml 18.70 ml/m  LA Vol (A4C):  81.8 ml 43.34 ml/m  LA Biplane Vol: 68.7 ml 36.40 ml/m  AORTIC VALVE  AV Area (Vmax):  1.67 cm  AV Area (Vmean):  1.74 cm  AV Area (VTI):   1.76 cm  AV Vmax:      236.00 cm/s  AV Vmean:     158.500 cm/s  AV VTI:      0.401 m  AV Peak Grad:   22.3 mmHg  AV Mean Grad:   12.0 mmHg  LVOT  Vmax:     113.50 cm/s  LVOT Vmean:    79.550 cm/s  LVOT VTI:     0.204 m  LVOT/AV VTI ratio: 0.51  AI PHT:      266 msec    AORTA  Ao Root diam: 3.60 cm  Ao Asc diam: 3.80 cm   MITRAL VALVE  MV Area (PHT): 6.12 cm   SHUNTS  MV Decel Time: 124 msec   Systemic VTI: 0.20 m  MR Peak grad:  101.6 mmHg Systemic Diam: 2.10 cm  MR Mean grad:  63.0 mmHg  MR Vmax:    504.00 cm/s  MR Vmean:    376.0 cm/s  MR PISA:    1.01 cm  MR PISA Radius: 0.40 cm  MV E velocity: 107.00 cm/s  MV A velocity: 69.40 cm/s  MV E/A ratio: 1.54   Jodelle RedBridgette Christopher MD  Electronically signed by Jodelle RedBridgette Christopher MD  Signature Date/Time: 03/28/2020/12:53:56 PM     TRANSESOPHAGEAL ECHOCARDIOGRAM  FINDINGS:  LEFT VENTRICLE: EF = 35-40%, with both regional and global wall motion abnormalities.  RIGHT VENTRICLE: Normal size and function.   LEFT ATRIUM: No thrombus/mass.  LEFT ATRIAL APPENDAGE: No thrombus/mass.   RIGHT ATRIUM: No thrombus/mass.  AORTIC VALVE:  Appears functionally bicuspid. Severe AI based on extension of jet across LV and vena contracta size. No vegetation.  MITRAL VALVE:  Abnormal structure, appears to have some MR due to annular dilation, but there is also a focal eccentric jet of regurgitation. There appears to possible be a cleft or perforation on 3D images adjacent to P2. At least moderate MR. PISA attempted but likely inaccurate due to eccentricity and multiple jets. No vegetation.  TRICUSPID VALVE: Normal structure. Trivial regurgitation. No vegetation.  PULMONIC VALVE: Grossly normal structure. Trivial regurgitation. No apparent vegetation.  INTERATRIAL SEPTUM: No PFO or ASD seen by color Doppler.  PERICARDIUM: No effusion noted.  DESCENDING AORTA: Moderate diffuse plaque seen   CONCLUSION: Severe AI, functionally bicuspid aortic valve, at least moderate MR that is eccentric, appears partially due to annular  dilation but also appears to have a cleft/perforation adjacent to P2.   Jodelle RedBridgette Christopher, MD, PhD [facility]  [facility]  [address], [address] [place], [place] [address] [phone]    Assessment/Plan:  This 64 year old poorly controlled diabetic smoker has severe three-vessel coronary disease with a reduced ejection fraction of 35 to 40%, severe aortic insufficiency, and moderate to severe mitral regurgitation.  He has never had any chest discomfort but has had some recent lower extremity edema and shortness of breath as well as exertional fatigue.  He presented with acute right lower extremity weakness and numbness this sounds a could have been a TIA or stroke although is MRI of the brain showed no evidence of acute stroke.  Carotid Dopplers are pending.  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  His admission symptoms of right lower extremity weakness and numbness have completely resolved and he is now ambulatory.  I think the best treatment for his cardiac disease is to proceed with coronary artery bypass graft surgery as well as aortic valve replacement and probable mitral valve repair.  His operative risk is increased due to the complexity of the surgery needed as well as his poorly controlled diabetes with a hemoglobin A1c of 10.7 and COPD with ongoing heavy smoking.  He has a few remaining teeth in place which appear fragmented and orthopantogram today showed a right mandible to fragment with inflammatory periapical lucency.  We will request a dental consultation anticipate that he will need extractions. I discussed the operative procedure with the patient  including alternatives, benefits and risks; including but not limited to  bleeding, blood transfusion, infection, stroke, myocardial infarction, graft failure, heart block requiring a permanent pacemaker, organ dysfunction, and death.  Adron Bene understands and agrees to proceed.  We will decide on surgical date after dental evaluation and treatment.  I spent 60 minutes performing this consultation and > 50% of this time was spent face to face counseling and coordinating the care of this patient's severe multivessel coronary disease, severe aortic insufficiency, and moderate to severe mitral regurgitation.  Alleen Borne 03/31/2020, 1:56 PM

## 2020-03-31 NOTE — Progress Notes (Signed)
Occupational Therapy Treatment Patient Details Name: Scott Rios MRN: [id number] DOB: Aug 27, 1955 Today's Date: 03/31/2020    History of present illness 64 y.o. male with a hx of DM and HTN, ETOH abuse and tobacco abusewho is being seen in consultation for the evaluation of acute pulmonary edema, LV dysfunction, moderate MR and severe ARat the request of Marguerita Merles, MD.   OT comments  Pt progressing towards established OT goals. Pt performing functional mobility to/from bathroom with Min A and RW; continues to present with decreased balance. Pt performing toileting, hand hygiene, and LB bathing at sink with Min Guard A for safety and balance. VSS on RA. Continue to recommend dc to home with HHOT and will continue to follow acutely as admitted.   Follow Up Recommendations  Home health OT;Supervision/Assistance - 24 hour    Equipment Recommendations  3 in 1 bedside commode (for use in shower)    Recommendations for Other Services      Precautions / Restrictions Precautions Precautions: Fall       Mobility Bed Mobility Overal bed mobility: Needs Assistance Bed Mobility: Supine to Sit     Supine to sit: Supervision     General bed mobility comments: Supervision for safety. No physical A needed  Transfers Overall transfer level: Needs assistance Equipment used: Rolling walker (2 wheeled) Transfers: Sit to/from Stand Sit to Stand: Min guard         General transfer comment: Min Guard A for safety    Balance Overall balance assessment: Needs assistance Sitting-balance support: No upper extremity supported;Feet supported Sitting balance-Leahy Scale: Fair     Standing balance support: Bilateral upper extremity supported;During functional activity Standing balance-Leahy Scale: Poor Standing balance comment: relies on UE support                           ADL either performed or assessed with clinical judgement   ADL Overall ADL's : Needs  assistance/impaired     Grooming: Min guard;Standing;Wash/dry hands Grooming Details (indicate cue type and reason): Min Guard A for safety     Lower Body Bathing: Min guard;Sit to/from stand Lower Body Bathing Details (indicate cue type and reason): Pt performing peri care at sink with Min Guard A for safety         Toilet Transfer: Min guard;Ambulation;Regular Toilet;RW Toilet Transfer Details (indicate cue type and reason): Min guard A for safety Toileting- Clothing Manipulation and Hygiene: Sitting/lateral lean;Supervision/safety       Functional mobility during ADLs: Minimal assistance;Rolling walker General ADL Comments: Pt performing toileting and grooming at sink. Also performing peri care while at sink. Pt requiring Min guard-Min A due to decreased balance. very motivated to participate in therapy     Vision       Perception     Praxis      Cognition Arousal/Alertness: Awake/alert Behavior During Therapy: WFL for tasks assessed/performed Overall Cognitive Status: Within Functional Limits for tasks assessed                                 General Comments: eager to mobilize         Exercises     Shoulder Instructions       General Comments VSS on RA. BP 141/53    Pertinent Vitals/ Pain       Pain Assessment: Faces Faces Pain Scale: No hurt Pain Intervention(s): Monitored during  session  Home Living Family/patient expects to be discharged to:: Private residence Living Arrangements: Children Available Help at Discharge: Family;Available 24 hours/day Type of Home: House Home Access: Stairs to enter [employer] of Steps: 3 Entrance Stairs-Rails: None Home Layout: One level     Bathroom Shower/Tub: Chief Strategy Officer: Standard     Home Equipment: Cane - single point          Prior Functioning/Environment Level of Independence: Independent with assistive device(s)        Comments: doing well with  cane PTa, bathes and dresses self.  Son present and stated that he needed help wtih walking as he was unbalanced at times   Frequency  Min 2X/week        Progress Toward Goals  OT Goals(current goals can now be found in the care plan section)  Progress towards OT goals: Progressing toward goals  Acute Rehab OT Goals Patient Stated Goal: to go home OT Goal Formulation: With patient Time For Goal Achievement: 04/12/20 Potential to Achieve Goals: Good ADL Goals Pt Will Perform Grooming: standing;with supervision Pt Will Perform Lower Body Bathing: sit to/from stand;sitting/lateral leans;with supervision Pt Will Perform Lower Body Dressing: sit to/from stand;sitting/lateral leans;with supervision Pt Will Transfer to Toilet: with supervision;ambulating Pt Will Perform Toileting - Clothing Manipulation and hygiene: with supervision;sit to/from stand;sitting/lateral leans Additional ADL Goal #1: Pt will identify at least 3 ways to reduce risk for falls.  Plan Discharge plan remains appropriate    Co-evaluation                 AM-PAC OT "6 Clicks" Daily Activity     Outcome Measure   Help from another person eating meals?: None Help from another person taking care of personal grooming?: A Little Help from another person toileting, which includes using toliet, bedpan, or urinal?: A Little Help from another person bathing (including washing, rinsing, drying)?: A Little Help from another person to put on and taking off regular upper body clothing?: A Little Help from another person to put on and taking off regular lower body clothing?: A Little 6 Click Score: 19    End of Session Equipment Utilized During Treatment: Rolling walker;Gait belt  OT Visit Diagnosis: Muscle weakness (generalized) (M62.81);Unsteadiness on feet (R26.81)   Activity Tolerance Patient tolerated treatment well   Patient Left in chair (with transport team)   Nurse Communication Mobility status         Time: 2778-2423 OT Time Calculation (min): 16 min  Charges: OT General Charges $OT Visit: 1 Visit OT Treatments $Self Care/Home Management : 8-22 mins  Euva Rundell MSOT, OTR/L Acute Rehab Pager: [phone] Office: [phone]   Scott Rios 03/31/2020, 10:52 AM

## 2020-03-31 NOTE — Progress Notes (Signed)
Progress Note  Patient Name: Scott Rios Date of Encounter: 03/31/2020  [facility] Cardiologist: No primary care provider on file.   Subjective   Scared but no further CP, no SOB. Awaiting TEE  Inpatient Medications    Scheduled Meds: . aspirin EC  81 mg Oral Daily  . atorvastatin  80 mg Oral Daily  . Chlorhexidine Gluconate Cloth  6 each Topical Daily  . cyanocobalamin  1,000 mcg Intramuscular Daily  . feeding supplement (GLUCERNA SHAKE)  237 mL Oral TID BM  . furosemide  40 mg Intravenous BID  . heparin injection (subcutaneous)  5,000 Units Subcutaneous Q8H  . insulin aspart  0-5 Units Subcutaneous QHS  . insulin aspart  0-9 Units Subcutaneous TID WC  . insulin glargine  12 Units Subcutaneous Daily  . losartan  25 mg Oral Daily  . metoprolol tartrate  25 mg Oral BID  . multivitamin with minerals  1 tablet Oral Daily  . nicotine  21 mg Transdermal Daily  . polyethylene glycol  17 g Oral BID  . senna-docusate  1 tablet Oral BID  . sodium chloride flush  3 mL Intravenous Q12H  . spironolactone  12.5 mg Oral Daily   Continuous Infusions: . sodium chloride    . thiamine injection 250 mg (03/30/20 2344)   PRN Meds: sodium chloride, acetaminophen, ondansetron (ZOFRAN) IV, sodium chloride flush   Vital Signs    Vitals:   03/30/20 1834 03/30/20 1934 03/31/20 0050 03/31/20 0338  BP: 118/62 (!) 120/58 106/62 107/65  Pulse: 83 81 69 70  Resp:  17  16  Temp:  97.7 F (36.5 C) 97.8 F (36.6 C) 97.7 F (36.5 C)  TempSrc:  Oral Oral Oral  SpO2: 93% 92% 98% 96%  Weight:    67.4 kg  Height:        Intake/Output Summary (Last 24 hours) at 03/31/2020 0804 Last data filed at 03/31/2020 0155 Gross per 24 hour  Intake 477 ml  Output 1400 ml  Net -923 ml   Last 3 Weights 03/31/2020 03/30/2020 03/29/2020  Weight (lbs) 148 lb 9.1 oz 149 lb 1.6 oz 147 lb 8.9 oz  Weight (kg) 67.39 kg 67.631 kg 66.93 kg      Telemetry    Occasional first-degree AV block no adverse  arrhythmias- Personally Reviewed  ECG    No new - Personally Reviewed  Physical Exam   GEN: No acute distress.   Neck: No JVD Cardiac: RRR, no murmurs, rubs, or gallops.  Respiratory: Clear to auscultation bilaterally. GI: Soft, nontender, non-distended  MS: No edema; No deformity. Neuro:  Nonfocal  Psych: Normal affect   Labs    High Sensitivity Troponin:   Recent Labs  Lab 03/27/20 2317 03/28/20 0324 03/28/20 0819 03/28/20 1006 03/29/20 0941  TROPONINIHS 27* 121* 1,479* 1,747* 629*      Chemistry Recent Labs  Lab 03/29/20 0941 03/29/20 0941 03/30/20 0309 03/30/20 0309 03/30/20 1459 03/30/20 1500 03/31/20 0341  NA 130*   < > 128*   < > 133* 132* 132*  K 4.2   < > 4.4   < > 3.9 4.1 4.5  CL 96*  --  94*  --   --   --  93*  CO2 26  --  25  --   --   --  29  GLUCOSE 221*  --  264*  --   --   --  88  BUN 7*  --  10  --   --   --  15  CREATININE 0.57*  --  0.78  --   --   --  0.74  CALCIUM 8.9  --  8.6*  --   --   --  9.3  PROT 5.5*  --  5.6*  --   --   --  5.9*  ALBUMIN 2.9*  --  2.9*  --   --   --  3.1*  AST 14*  --  16  --   --   --  18  ALT 11  --  13  --   --   --  16  ALKPHOS 57  --  74  --   --   --  69  BILITOT 0.7  --  0.3  --   --   --  0.7  GFRNONAA >60  --  >60  --   --   --  >60  GFRAA >60  --  >60  --   --   --  >60  ANIONGAP 8  --  9  --   --   --  10   < > = values in this interval not displayed.     Hematology Recent Labs  Lab 03/29/20 0941 03/29/20 0941 03/30/20 0309 03/30/20 0309 03/30/20 1459 03/30/20 1500 03/31/20 0341  WBC 13.7*  --  12.0*  --   --   --  11.1*  RBC 4.36  --  4.25  --   --   --  4.31  HGB 12.9*   < > 12.7*   < > 11.9* 12.6* 12.8*  HCT 36.9*   < > 36.5*   < > 35.0* 37.0* 37.3*  MCV 84.6  --  85.9  --   --   --  86.5  MCH 29.6  --  29.9  --   --   --  29.7  MCHC 35.0  --  34.8  --   --   --  34.3  RDW 13.2  --  13.5  --   --   --  13.5  PLT 342  --  356  --   --   --  355   < > = values in this interval not  displayed.    BNPNo results for input(s): BNP, PROBNP in the last 168 hours.   DDimer  Recent Labs  Lab 03/28/20 2216  DDIMER 0.84*     Radiology    CARDIAC CATHETERIZATION  Addendum Date: 03/30/2020    Prox RCA to Mid RCA lesion is 80% stenosed.  RPDA lesion is 90% stenosed.  1st Mrg lesion is 100% stenosed.  3rd Mrg lesion is 75% stenosed.  Prox Cx to Mid Cx lesion is 75% stenosed.  Mid LAD lesion is 100% stenosed.  Mid LAD to Dist LAD lesion is 70% stenosed.  2nd Diag lesion is 40% stenosed.  Prox LAD to Mid LAD lesion is 25% stenosed.  LV end diastolic pressure is normal.  There is moderate (3+) aortic regurgitation.  There is mild left ventricular systolic dysfunction.  The left ventricular ejection fraction is 35-45% by visual estimate.  Right heart results on room air: Ao sat 90%, PA sat 57%, PA pressure 28/12, mean PA 18 mm Hg; mean PCWP 17 mm Hg; CO 4.1 L/min; CI 2.33  Severe three vessel disease.  Moderate to severe aortic insufficiency. Plan for TEE tomorrow. Will need cardiac surgery consult for CABG and likely AVR. Results conveyed to his son, Aurelio Brash.   Addendum Date: 03/30/2020  Prox RCA to Mid RCA lesion is 80% stenosed.  RPDA lesion is 90% stenosed.  1st Mrg lesion is 100% stenosed.  3rd Mrg lesion is 75% stenosed.  Prox Cx to Mid Cx lesion is 75% stenosed.  Mid LAD lesion is 100% stenosed.  Mid LAD to Dist LAD lesion is 70% stenosed.  2nd Diag lesion is 40% stenosed.  Prox LAD to Mid LAD lesion is 25% stenosed.  LV end diastolic pressure is normal.  There is moderate (3+) aortic regurgitation.  There is mild left ventricular systolic dysfunction.  The left ventricular ejection fraction is 35-45% by visual estimate.  Right heart results on room air: Ao sat 90%, PA sat 57%, PA pressure 28/12, mean PA 18 mm Hg; mean PCWP 17 mm Hg; CO 4.1 L/min; CI 2.33  Severe three vessel disease.  Moderate to severe aortic insufficiency. Plan for TEE tomorrow. Will need  cardiac surgery consult for CABG and likely AVR. Results conveyed to his son, Aurelio Brash.   Result Date: 03/30/2020  Prox RCA to Mid RCA lesion is 80% stenosed.  RPDA lesion is 90% stenosed.  1st Mrg lesion is 100% stenosed.  3rd Mrg lesion is 75% stenosed.  Prox Cx to Mid Cx lesion is 75% stenosed.  Mid LAD lesion is 100% stenosed.  Mid LAD to Dist LAD lesion is 70% stenosed.  2nd Diag lesion is 40% stenosed.  Prox LAD to Mid LAD lesion is 25% stenosed.  LV end diastolic pressure is normal.  There is moderate (3+) aortic regurgitation.  There is mild left ventricular systolic dysfunction.  The left ventricular ejection fraction is 35-45% by visual estimate.  Severe three vessel disease.  Moderate to severe aortic insufficiency. Plan for TEE tomorrow. Will need cardiac surgery consult for CABG and likely AVR.   DG CHEST PORT 1 VIEW  Result Date: 03/31/2020 CLINICAL DATA:  Shortness of breath. EXAM: PORTABLE CHEST 1 VIEW COMPARISON:  Chest x-ray 03/30/2020. FINDINGS: Mediastinum and hilar structures normal. Cardiomegaly. Mild pulmonary venous congestion. Bilateral interstitial prominence again noted. Interstitial edema and/or pneumonitis cannot be excluded. Low lung volumes with bibasilar atelectasis/infiltrates. Small bilateral pleural effusions again noted. No pneumothorax. No acute bony abnormality. 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 IMPRESSION: 1. Cardiomegaly with mild pulmonary venous congestion noted on today's exam. 2. Bilateral interstitial prominence again noted. Interstitial edema and/or pneumonitis cannot be excluded. 3. Low lung volumes with persistent bibasilar atelectasis/infiltrates. Small bilateral pleural effusions again noted. Electronically Signed   By: Maisie Fus  Register   On: 03/31/2020 07:30   DG CHEST PORT 1 VIEW  Result Date: 03/30/2020 CLINICAL DATA:  Short of breath EXAM: PORTABLE CHEST 1 VIEW COMPARISON:  CT 03/29/2020  FINDINGS: Normal cardiac silhouette. There is bilateral lower lobe lower lobe atelectasis and effusion similar to CT 1 day prior. No focal consolidation. No pneumothorax. IMPRESSION: 1. No interval change. 2. Bibasilar atelectasis and effusions. Electronically Signed   By: Genevive Bi M.D.   On: 03/30/2020 06:12   CT ANGIO CHEST AORTA W/CM & OR WO/CM  Result Date: 03/29/2020 CLINICAL DATA:  Exclude aortic dissection.  Preop AVR planning. EXAM: CT ANGIOGRAPHY CHEST WITH CONTRAST TECHNIQUE: Multidetector CT imaging of the chest was performed using the standard protocol during bolus administration of intravenous contrast. Multiplanar CT image reconstructions and MIPs were obtained to evaluate the vascular anatomy. CONTRAST:  51mL OMNIPAQUE IOHEXOL 350 MG/ML SOLN COMPARISON:  None. FINDINGS: Cardiovascular: Heart is normal size. There is a small amount of pericardial  fluid present. Moderate calcified plaque over the 3 vessel coronary arteries. Mild ectasia of the ascending thoracic aorta measuring 3.7 cm in AP diameter. Aortic root measures 4.1 cm and sinotubular junction 3 cm. No evidence of dissection. Mild calcified plaque over the thoracic aorta. Mild focal noncalcified plaque along the lateral aspect of the arch just distal to the takeoff of the left subclavian artery. Normal 3 vessel takeoff from the aortic arch. Noncalcified plaque over the origin of the left subclavian artery. Remaining vascular structures are unremarkable. Mediastinum/Nodes: 1.1 cm right paratracheal lymph node likely reactive. No significant hilar adenopathy. Remaining mediastinal structures are normal. Lungs/Pleura: Moderate size bilateral pleural effusions with associated bibasilar atelectasis. Patchy areas of ground-glass opacification over the mid to upper lungs which may be due to infection. 3 mm and 4 mm nodules mild debris along the distal tracheal wall of uncertain clinical significance. Remaining airways are unremarkable.  Over the right lower lobe. Upper Abdomen: Previous cholecystectomy. Calcified and noncalcified plaque over the abdominal aorta. No acute findings. Musculoskeletal: Mild degenerative change of the spine. Review of the MIP images confirms the above findings. IMPRESSION: 1. No evidence of aortic dissection. Mild ectasia of the ascending thoracic aorta measuring 3.7 cm in AP diameter. 2. Moderate size bilateral pleural effusions with associated bibasilar atelectasis. Patchy ground-glass opacification over the mid to upper lungs which may be due to infection or inflammatory process. 1.1 cm right paratracheal lymph node likely reactive. 3. Aortic atherosclerosis. Atherosclerotic coronary artery disease. Aortic Atherosclerosis (ICD10-I70.0). Electronically Signed   By: Elberta Fortis M.D.   On: 03/29/2020 15:26   VAS [place] LOWER EXTREMITY VENOUS (DVT)  Result Date: 03/29/2020  Lower Venous DVTStudy Indications: Swelling.  Comparison Study: No prior study Performing Technologist: Gertie Fey MHA, RDMS, RVT, RDCS  Examination Guidelines: A complete evaluation includes B-mode imaging, spectral Doppler, color Doppler, and power Doppler as needed of all accessible portions of each vessel. Bilateral testing is considered an integral part of a complete examination. Limited examinations for reoccurring indications may be performed as noted. The reflux portion of the exam is performed with the patient in reverse Trendelenburg.  +-----+---------------+---------+-----------+----------+--------------+ RIGHTCompressibilityPhasicitySpontaneityPropertiesThrombus Aging +-----+---------------+---------+-----------+----------+--------------+ CFV  Full           No       Yes                                 +-----+---------------+---------+-----------+----------+--------------+   +---------+---------------+---------+-----------+----------+--------------+ LEFT     CompressibilityPhasicitySpontaneityPropertiesThrombus  Aging +---------+---------------+---------+-----------+----------+--------------+ CFV      Full           No       Yes                                 +---------+---------------+---------+-----------+----------+--------------+ SFJ      Full                                                        +---------+---------------+---------+-----------+----------+--------------+ FV Prox  Full                                                        +---------+---------------+---------+-----------+----------+--------------+  FV Mid   Full                                                        +---------+---------------+---------+-----------+----------+--------------+ FV DistalFull                                                        +---------+---------------+---------+-----------+----------+--------------+ PFV      Full                                                        +---------+---------------+---------+-----------+----------+--------------+ POP      Full           No       Yes                                 +---------+---------------+---------+-----------+----------+--------------+ PTV      Full                                                        +---------+---------------+---------+-----------+----------+--------------+ PERO     Full                                                        +---------+---------------+---------+-----------+----------+--------------+     Summary: RIGHT: - No evidence of common femoral vein obstruction.  LEFT: - There is no evidence of deep vein thrombosis in the lower extremity.  - No cystic structure found in the popliteal fossa. Lower extremity venous flow is pulsatile, suggestive of possibly elevated right heart pressure.  *See table(s) above for measurements and observations. Electronically signed by Fabienne Bruns MD on 03/29/2020 at 12:28:51 PM.    Final     Cardiac Studies   ECHO 03/31/20:  1. Left ventricular  ejection fraction, by estimation, is 35 to 40%. The  left ventricle has moderately decreased function. The left ventricle  demonstrates regional wall motion abnormalities (see scoring  diagram/findings for description). The left  ventricular internal cavity size was mildly dilated. There is mild  concentric left ventricular hypertrophy. Left ventricular diastolic  parameters are consistent with Grade II diastolic dysfunction  (pseudonormalization). There is moderate hypokinesis of  the left ventricular, mid-apical inferoseptal wall and inferior wall.  There is severe hypokinesis of the left ventricular, mid-apical  anteroseptal wall.  2. Right ventricular systolic function is normal. The right ventricular  size is normal.  3. Left atrial size was severely dilated.  4. Right atrial size was mildly dilated.  5. Likely moderate eccentric MR.. The mitral valve is abnormal. Moderate  mitral valve regurgitation.  6. Fixed right coronary cusp with  prominent focal calcification. Suspect  bicuspid valve, but obscured by significant calcification. Severe aortic  regurgitation, best seen on apical images, with mitral valve anterior  leaflet restriction secondary to jet.  Vena contracta 0.65 cm on 5 chamber apical, 0.80 cm on 3 chamber apical,  fills most of LVOT and projects into majority of LV cavity (see image 83).  Some diastolic flow reversal seen in descending aorta but not well  visualized.. The aortic valve has an  indeterminant number of cusps. Aortic valve regurgitation is severe. Mild  aortic valve stenosis.  7. Aortic dilatation noted. There is borderline dilatation of the  ascending aorta measuring 38 mm.  8. The inferior vena cava is dilated in size with <50% respiratory  variability, suggesting right atrial pressure of 15 mmHg.   Comparison(s): No prior Echocardiogram.   Conclusion(s)/Recommendation(s): Abnormal study. Reduced EF with both  global and focal hypokinesis;  mild global hypokinesis, with moderate wall  motion abnormalities in the mid to apical inferoseptal and inferior wall,  with severe hypokinesis in the mid  to apical anteroseptal walls. Also with severe aortic regurgitation,  calcified and likely bicuspid aortic valve with mild stenosis.   Cath 03/30/20:   Prox RCA to Mid RCA lesion is 80% stenosed.  RPDA lesion is 90% stenosed.  1st Mrg lesion is 100% stenosed.  3rd Mrg lesion is 75% stenosed.  Prox Cx to Mid Cx lesion is 75% stenosed.  Mid LAD lesion is 100% stenosed.  Mid LAD to Dist LAD lesion is 70% stenosed.  2nd Diag lesion is 40% stenosed.  Prox LAD to Mid LAD lesion is 25% stenosed.  LV end diastolic pressure is normal.  There is moderate (3+) aortic regurgitation.  There is mild left ventricular systolic dysfunction.  The left ventricular ejection fraction is 35-45% by visual estimate.  Right heart results on room air: Ao sat 90%, PA sat 57%, PA pressure 28/12, mean PA 18 mm Hg; mean PCWP 17 mm Hg; CO 4.1 L/min; CI 2.33   Severe three vessel disease.  Moderate to severe aortic insufficiency.  Diagnostic Dominance: Right    Plan for TEE    Patient Profile     64 y.o. male with severe aortic insufficiency, moderate mitral regurgitation, diabetes hypertension with newly discovered severe triple-vessel coronary artery disease here with acute systolic heart failure, flash pulmonary edema  Assessment & Plan    Severe triple-vessel disease with underlying severe aortic insufficiency and moderate mitral regurgitation -TEE today to further clarify mitral valve and aortic valve disease. -After this information is gathered, cardiothoracic surgery consultation.  Non-ST elevation myocardial infarction -Troponin increased to 1700 now trending downward.  This is likely representative of demand ischemia process in the setting of his underlying triple-vessel disease and underlying valvular disease.  Severe aortic  regurgitation -TEE today.  Anticipating aortic valve replacement.  Possible bicuspid valve.  Aortic dimensions are minimally dilated/normal at 3.7 cm.  Moderate mitral regurgitation -Anterior mitral jet is restricted due to severe aortic insufficiency.  Assess.  Tobacco use -Continue to encourage cessation  Type 2 diabetes with cardiomyopathy -Hemoglobin A1c 10.2  Hyponatremia -Possibly from underlying cardiomyopathy.  Improved with diuresis.     For questions or updates, please contact [facility] Please consult [email or web address] for contact info under        Signed, Donato SchultzMark Devun Anna, MD  03/31/2020, 8:04 AM

## 2020-03-31 NOTE — Progress Notes (Signed)
Physical Therapy Treatment Patient Details Name: Scott Rios MRN: [id number] DOB: [birth year] Today's Date: 03/31/2020    History of Present Illness 64 y.o. male with a hx of DM and HTN, ETOH abuse and tobacco abusewho is being seen in consultation for the evaluation of acute pulmonary edema, LV dysfunction, moderate MR and severe ARat the request of Marguerita Merles, MD.    PT Comments    Pt is being seen by acute PT to restore his mobility and strengthen LE's.  Pt is tolerating resisted there ex to BLE's, standing balance gait with cues for direction al assist.  Will need to continue therapy as pt is having issues of incoordination, weakness esp RLE and uneven weight distribution on legs.  Focus on balance and endurance to benefit gait.  Follow Up Recommendations  Home health PT;Supervision/Assistance - 24 hour     Equipment Recommendations  Rolling walker with 5" wheels    Recommendations for Other Services       Precautions / Restrictions Precautions Precautions: Fall Precaution Comments: monitor HR and sats Restrictions Weight Bearing Restrictions: No    Mobility  Bed Mobility Overal bed mobility: Needs Assistance             General bed mobility comments: up in chair when PT arrived  Transfers Overall transfer level: Needs assistance Equipment used: Rolling walker (2 wheeled) Transfers: Sit to/from Stand Sit to Stand: Min guard         General transfer comment: contact to cue for safety with gaining control of standing  Ambulation/Gait Ambulation/Gait assistance: Min assist;Min guard Gait Distance (Feet): 300 Feet Assistive device: Rolling walker (2 wheeled) Gait Pattern/deviations: Step-through pattern;Decreased stride length;Drifts right/left;Trunk flexed     General Gait Details: per son was getting help to walk at home   Stairs             Wheelchair Mobility    Modified Rankin (Stroke Patients Only)       Balance     Sitting  balance-Leahy Scale: Good     Standing balance support: Bilateral upper extremity supported;During functional activity Standing balance-Leahy Scale: Fair                              Cognition Arousal/Alertness: Awake/alert Behavior During Therapy: WFL for tasks assessed/performed Overall Cognitive Status: Within Functional Limits for tasks assessed                                 General Comments: motivated      Exercises      General Comments General comments (skin integrity, edema, etc.): o2 sats remain over 90% but HR was up to 124      Pertinent Vitals/Pain Pain Assessment: Faces Faces Pain Scale: No hurt Pain Intervention(s): Monitored during session;Repositioned    Home Living                      Prior Function            PT Goals (current goals can now be found in the care plan section) Acute Rehab PT Goals Patient Stated Goal: to go home Progress towards PT goals: Progressing toward goals    Frequency    Min 3X/week      PT Plan Current plan remains appropriate    Co-evaluation  AM-PAC PT "6 Clicks" Mobility   Outcome Measure  Help needed turning from your back to your side while in a flat bed without using bedrails?: None Help needed moving from lying on your back to sitting on the side of a flat bed without using bedrails?: A Little Help needed moving to and from a bed to a chair (including a wheelchair)?: A Little Help needed standing up from a chair using your arms (e.g., wheelchair or bedside chair)?: A Little Help needed to walk in hospital room?: A Little Help needed climbing 3-5 steps with a railing? : A Little 6 Click Score: 19    End of Session Equipment Utilized During Treatment: Gait belt Activity Tolerance: Patient limited by fatigue Patient left: in chair;with call bell/phone within reach;with chair alarm set;with family/visitor present Nurse Communication: Mobility  status PT Visit Diagnosis: Unsteadiness on feet (R26.81);Muscle weakness (generalized) (M62.81)     Time: 1720-1743 PT Time Calculation (min) (ACUTE ONLY): 23 min  Charges:  $Gait Training: 8-22 mins $Therapeutic Exercise: 8-22 mins                   Ivar Drape 03/31/2020, 10:24 PM  Samul Dada, PT MS Acute Rehab Dept. Number: [facility] [id number] and [facility] [phone]

## 2020-03-31 NOTE — Anesthesia Preprocedure Evaluation (Addendum)
Anesthesia Evaluation   Patient awake    Reviewed: Allergy & Precautions, NPO status , Patient's Chart, lab work & pertinent test results  Airway Mallampati: I  TM Distance: >3 FB Neck ROM: Full    Dental  (+) Poor Dentition   Pulmonary Current Smoker,    + rhonchi        Cardiovascular hypertension, +CHF and + DOE  + Valvular Problems/Murmurs (AR)  Rhythm:Regular Rate:Normal + Systolic murmurs and + Diastolic murmurs  Prox RCA to Mid RCA lesion is 80% stenosed.  RPDA lesion is 90% stenosed.  1st Mrg lesion is 100% stenosed.  3rd Mrg lesion is 75% stenosed.  Prox Cx to Mid Cx lesion is 75% stenosed.  Mid LAD lesion is 100% stenosed.  Mid LAD to Dist LAD lesion is 70% stenosed.  2nd Diag lesion is 40% stenosed.  Prox LAD to Mid LAD lesion is 25% stenosed.  LV end diastolic pressure is normal.  There is moderate (3+) aortic regurgitation.  There is mild left ventricular systolic dysfunction.  The left ventricular ejection fraction is 35-45% by visual estimate.  Right heart results on room air: Ao sat 90%, PA sat 57%, PA pressure 28/12, mean PA 18 mm Hg; mean PCWP 17 mm Hg; CO 4.1 L/min; CI 2.33   Severe three vessel disease.  Moderate to severe aortic insufficiency.    Will need cardiac surgery consult for CABG and likely AVR.     Neuro/Psych negative neurological ROS  negative psych ROS   GI/Hepatic negative GI ROS, Neg liver ROS,   Endo/Other  diabetes, Poorly Controlled, Type 2  Renal/GU negative Renal ROS  negative genitourinary   Musculoskeletal   Abdominal Normal abdominal exam  (+)   Peds  Hematology  (+) anemia ,   Anesthesia Other Findings   Reproductive/Obstetrics negative OB ROS                           Anesthesia Physical Anesthesia Plan  ASA: IV  Anesthesia Plan: MAC   Post-op Pain Management:    Induction: Intravenous  PONV Risk Score and  Plan: TIVA, Propofol infusion and Treatment may vary due to age or medical condition  Airway Management Planned: Natural Airway and Simple Face Mask  Additional Equipment:   Intra-op Plan:   Post-operative Plan:   Informed Consent: I have reviewed the patients History and Physical, chart, labs and discussed the procedure including the risks, benefits and alternatives for the proposed anesthesia with the patient or authorized representative who has indicated his/her understanding and acceptance.     Dental advisory given  Plan Discussed with:   Anesthesia Plan Comments:         Anesthesia Quick Evaluation

## 2020-04-01 ENCOUNTER — Inpatient Hospital Stay (HOSPITAL_COMMUNITY): Payer: Medicare Other

## 2020-04-01 ENCOUNTER — Other Ambulatory Visit (HOSPITAL_COMMUNITY): Payer: Self-pay | Admitting: Respiratory Therapy

## 2020-04-01 DIAGNOSIS — I5021 Acute systolic (congestive) heart failure: Secondary | ICD-10-CM | POA: Diagnosis not present

## 2020-04-01 DIAGNOSIS — I2510 Atherosclerotic heart disease of native coronary artery without angina pectoris: Secondary | ICD-10-CM | POA: Diagnosis not present

## 2020-04-01 DIAGNOSIS — Z01810 Encounter for preprocedural cardiovascular examination: Secondary | ICD-10-CM | POA: Diagnosis not present

## 2020-04-01 LAB — CBC WITH DIFFERENTIAL/PLATELET
Abs Immature Granulocytes: 0.04 10*3/uL (ref 0.00–0.07)
Basophils Absolute: 0.1 10*3/uL (ref 0.0–0.1)
Basophils Relative: 1 %
Eosinophils Absolute: 0.4 10*3/uL (ref 0.0–0.5)
Eosinophils Relative: 3 %
HCT: 36.9 % — ABNORMAL LOW (ref 39.0–52.0)
Hemoglobin: 12.2 g/dL — ABNORMAL LOW (ref 13.0–17.0)
Immature Granulocytes: 0 %
Lymphocytes Relative: 20 %
Lymphs Abs: 2.1 10*3/uL (ref 0.7–4.0)
MCH: 28.6 pg (ref 26.0–34.0)
MCHC: 33.1 g/dL (ref 30.0–36.0)
MCV: 86.4 fL (ref 80.0–100.0)
Monocytes Absolute: 0.8 10*3/uL (ref 0.1–1.0)
Monocytes Relative: 8 %
Neutro Abs: 7.2 10*3/uL (ref 1.7–7.7)
Neutrophils Relative %: 68 %
Platelets: 331 10*3/uL (ref 150–400)
RBC: 4.27 MIL/uL (ref 4.22–5.81)
RDW: 13.6 % (ref 11.5–15.5)
WBC: 10.6 10*3/uL — ABNORMAL HIGH (ref 4.0–10.5)
nRBC: 0 % (ref 0.0–0.2)

## 2020-04-01 LAB — PULMONARY FUNCTION TEST
FEF 25-75 Pre: 0.89 L/sec
FEF2575-%Pred-Pre: 36 %
FEV1-%Pred-Pre: 51 %
FEV1-Pre: 1.57 L
FEV1FVC-%Pred-Pre: 93 %
FEV6-%Pred-Pre: 56 %
FEV6-Pre: 2.19 L
FEV6FVC-%Pred-Pre: 102 %
FVC-%Pred-Pre: 54 %
FVC-Pre: 2.25 L
Pre FEV1/FVC ratio: 70 %
Pre FEV6/FVC Ratio: 97 %

## 2020-04-01 LAB — COMPREHENSIVE METABOLIC PANEL
ALT: 15 U/L (ref 0–44)
AST: 14 U/L — ABNORMAL LOW (ref 15–41)
Albumin: 2.8 g/dL — ABNORMAL LOW (ref 3.5–5.0)
Alkaline Phosphatase: 73 U/L (ref 38–126)
Anion gap: 7 (ref 5–15)
BUN: 15 mg/dL (ref 8–23)
CO2: 30 mmol/L (ref 22–32)
Calcium: 8.7 mg/dL — ABNORMAL LOW (ref 8.9–10.3)
Chloride: 93 mmol/L — ABNORMAL LOW (ref 98–111)
Creatinine, Ser: 0.74 mg/dL (ref 0.61–1.24)
GFR calc Af Amer: >60 mL/min (ref >60)
GFR calc non Af Amer: >60 mL/min (ref >60)
Glucose, Bld: 170 mg/dL — ABNORMAL HIGH (ref 70–99)
Potassium: 4 mmol/L (ref 3.5–5.1)
Sodium: 130 mmol/L — ABNORMAL LOW (ref 135–145)
Total Bilirubin: 0.3 mg/dL (ref 0.3–1.2)
Total Protein: 5.4 g/dL — ABNORMAL LOW (ref 6.5–8.1)

## 2020-04-01 LAB — VITAMIN B1: Vitamin B1 (Thiamine): 226.8 nmol/L — ABNORMAL HIGH (ref 66.5–200.0)

## 2020-04-01 LAB — GLUCOSE, CAPILLARY
Glucose-Capillary: 294 mg/dL — ABNORMAL HIGH (ref 70–99)
Glucose-Capillary: 276 mg/dL — ABNORMAL HIGH (ref 70–99)
Glucose-Capillary: 154 mg/dL — ABNORMAL HIGH (ref 70–99)
Glucose-Capillary: 199 mg/dL — ABNORMAL HIGH (ref 70–99)

## 2020-04-01 LAB — PHOSPHORUS: Phosphorus: 4.2 mg/dL (ref 2.5–4.6)

## 2020-04-01 LAB — MAGNESIUM: Magnesium: 1.8 mg/dL (ref 1.7–2.4)

## 2020-04-01 MED ORDER — EPINEPHRINE HCL 5 MG/250ML IV SOLN IN NS
0.0000 ug/min | INTRAVENOUS | Status: DC
  Filled 2020-04-01: qty 250

## 2020-04-01 MED ORDER — NOREPINEPHRINE 4 MG/250ML-% IV SOLN
0.0000 ug/min | INTRAVENOUS | Status: DC
  Filled 2020-04-01: qty 250

## 2020-04-01 MED ORDER — MILRINONE LACTATE IN DEXTROSE 20-5 MG/100ML-% IV SOLN
0.3000 ug/kg/min | INTRAVENOUS | Status: DC
  Filled 2020-04-01: qty 100

## 2020-04-01 MED ORDER — TRANEXAMIC ACID 1000 MG/10ML IV SOLN
1.5000 mg/kg/h | INTRAVENOUS | Status: DC
  Filled 2020-04-01: qty 25

## 2020-04-01 MED ORDER — POTASSIUM CHLORIDE 2 MEQ/ML IV SOLN
80.0000 meq | INTRAVENOUS | Status: DC
  Filled 2020-04-01: qty 40

## 2020-04-01 MED ORDER — PHENYLEPHRINE HCL-NACL 20-0.9 MG/250ML-% IV SOLN
30.0000 ug/min | INTRAVENOUS | Status: DC
  Filled 2020-04-01: qty 250

## 2020-04-01 MED ORDER — LEVOFLOXACIN IN D5W 500 MG/100ML IV SOLN
500.0000 mg | INTRAVENOUS | Status: DC
  Filled 2020-04-01: qty 100

## 2020-04-01 MED ORDER — CLINDAMYCIN PHOSPHATE 600 MG/50ML IV SOLN
600.0000 mg | Freq: Once | INTRAVENOUS | Status: AC
  Administered 2020-04-03: 600 mg via INTRAVENOUS
  Filled 2020-04-01 (×2): qty 50

## 2020-04-01 MED ORDER — TRANEXAMIC ACID (OHS) PUMP PRIME SOLUTION
2.0000 mg/kg | INTRAVENOUS | Status: DC
  Filled 2020-04-01: qty 1.35

## 2020-04-01 MED ORDER — SODIUM CHLORIDE 0.9 % IV SOLN
INTRAVENOUS | Status: DC
  Filled 2020-04-01: qty 30

## 2020-04-01 MED ORDER — MAGNESIUM SULFATE 50 % IJ SOLN
40.0000 meq | INTRAMUSCULAR | Status: DC
  Filled 2020-04-01: qty 9.85

## 2020-04-01 MED ORDER — DIPHENHYDRAMINE HCL 25 MG PO CAPS
25.0000 mg | ORAL_CAPSULE | Freq: Every evening | ORAL | Status: DC | PRN
  Administered 2020-04-04 – 2020-04-06 (×4): 25 mg via ORAL
  Filled 2020-04-01 (×4): qty 1

## 2020-04-01 MED ORDER — INSULIN REGULAR(HUMAN) IN NACL 100-0.9 UT/100ML-% IV SOLN
INTRAVENOUS | Status: DC
  Filled 2020-04-01: qty 100

## 2020-04-01 MED ORDER — INSULIN GLARGINE 100 UNIT/ML ~~LOC~~ SOLN
14.0000 [IU] | Freq: Every day | SUBCUTANEOUS | Status: DC
  Administered 2020-04-01 – 2020-04-08 (×8): 14 [IU] via SUBCUTANEOUS
  Filled 2020-04-01 (×8): qty 0.14

## 2020-04-01 MED ORDER — VANCOMYCIN HCL 1250 MG/250ML IV SOLN
1250.0000 mg | INTRAVENOUS | Status: DC
  Filled 2020-04-01: qty 250

## 2020-04-01 MED ORDER — PLASMA-LYTE 148 IV SOLN
INTRAVENOUS | Status: DC
  Filled 2020-04-01: qty 2.5

## 2020-04-01 MED ORDER — TRANEXAMIC ACID (OHS) BOLUS VIA INFUSION: 15.0000 mg/kg | INTRAVENOUS | Status: DC

## 2020-04-01 MED ORDER — NITROGLYCERIN IN D5W 200-5 MCG/ML-% IV SOLN
2.0000 ug/min | INTRAVENOUS | Status: DC
  Filled 2020-04-01: qty 250

## 2020-04-01 MED ORDER — DEXMEDETOMIDINE HCL IN NACL 400 MCG/100ML IV SOLN
0.1000 ug/kg/h | INTRAVENOUS | Status: DC
  Filled 2020-04-01: qty 100

## 2020-04-01 NOTE — Consult Note (Signed)
DENTAL CONSULTATION  Date of Consultation:  04/01/2020 Patient Name:   Scott Rios Date of Birth:   08/01/1956 Medical Record Number: [id number]  VITALS: BP 115/76 (BP Location: Left Arm)   Pulse 82   Temp 98.2 F (36.8 C) (Oral)   Resp 18   Ht 5\' 7"  (1.702 m)   Wt 67.5 kg   SpO2 95%   BMI 23.31 kg/m   CHIEF COMPLAINT: Patient referred by Dr. Laneta SimmersBartle for dental consultation.  HPI: Scott Rios is a 64 year old male recently diagnosed with severe aortic insufficiency, moderate to severe mitral regurgitation, three-vessel coronary artery disease.  Patient with anticipated aortic valve replacement, mitral valve repair, and coronary artery bypass graft procedure with Dr. Laneta SimmersBartle.  Patient is now seen as part of medically necessary preheart valve surgery dental protocol examination.  Patient currently denies acute toothaches, swellings, or abscesses.  Patient last saw dentist more than 15 years ago patient had a tooth pulled at that time with no complications.  Patient denies having any dentures.  Patient denies having dental phobia.  Patient denies knowing that he has upper right and upper left third molar impactions.  PROBLEM LIST: Patient Active Problem List   Diagnosis Date Noted  . Right leg weakness 03/28/2020  . Flash pulmonary edema (HCC) 03/28/2020  . Hypomagnesemia 03/28/2020  . Hypoalbuminemia 03/28/2020  . Hyperglycemia 03/28/2020  . Hyponatremia 03/28/2020  . Elevated troponin 03/28/2020  . Pulmonary nodule 03/28/2020  . Acute respiratory failure with hypoxia (HCC) 03/28/2020  . Tobacco abuse 03/28/2020  . Acute pulmonary edema (HCC)   . Weakness of right lower extremity   . Acute systolic heart failure (HCC)   . Nonrheumatic mitral valve regurgitation   . Nonrheumatic aortic valve insufficiency     PMH: Past Medical History:  Diagnosis Date  . Diabetes mellitus without complication (HCC)   . Hypertension     PSH: Past Surgical History:  Procedure  Laterality Date  . CHOLECYSTECTOMY    . HERNIA REPAIR    . RIGHT/LEFT HEART CATH AND CORONARY ANGIOGRAPHY N/A 03/30/2020   Procedure: RIGHT/LEFT HEART CATH AND CORONARY ANGIOGRAPHY;  Surgeon: Corky CraftsVaranasi, Jayadeep S, MD;  Location: [facility] IN[facility]ASIVE [facility];  Service: Cardiovascular;  Laterality: N/A;    ALLERGIES: Allergies  Allergen Reactions  . Penicillins      Unknown reaction was told it happened as a child    MEDICATIONS: Current Facility-Administered Medications  Medication Dose Route Frequency Provider Last Rate Last Admin  . 0.9 %  sodium chloride infusion  250 mL Intravenous PRN Jodelle Redhristopher, Bridgette, MD      . acetaminophen (TYLENOL) tablet 650 mg  650 mg Oral Q4H PRN Jodelle Redhristopher, Bridgette, MD      . aspirin EC tablet 81 mg  81 mg Oral Daily Jodelle Redhristopher, Bridgette, MD   81 mg at 04/01/20 0852  . atorvastatin (LIPITOR) tablet 80 mg  80 mg Oral Daily Jodelle Redhristopher, Bridgette, MD   80 mg at 04/01/20 0851  . Chlorhexidine Gluconate Cloth 2 % PADS 6 each  6 each Topical Daily Jodelle Redhristopher, Bridgette, MD   6 each at 03/29/20 [phone]  . cyanocobalamin ((VITAMIN B-12)) injection 1,000 mcg  1,000 mcg Intramuscular Daily Jodelle Redhristopher, Bridgette, MD   1,000 mcg at 04/01/20 0850  . diphenhydrAMINE (BENADRYL) capsule 25 mg  25 mg Oral QHS PRN Shalhoub, Deno LungerGeorge J, MD      . feeding supplement (GLUCERNA SHAKE) (GLUCERNA SHAKE) liquid 237 mL  237 mL Oral TID BM Jodelle Redhristopher, Bridgette, MD   [phone]  mL at 03/31/20 2119  . furosemide (LASIX) injection 40 mg  40 mg Intravenous BID Jodelle Red, MD   40 mg at 03/31/20 1700  . heparin injection 5,000 Units  5,000 Units Subcutaneous Q8H Jodelle Red, MD   5,000 Units at 04/01/20 [phone]  . insulin aspart (novoLOG) injection 0-5 Units  0-5 Units Subcutaneous QHS Jodelle Red, MD   4 Units at 03/31/20 2120  . insulin aspart (novoLOG) injection 0-9 Units  0-9 Units Subcutaneous TID WC Jodelle Red, MD   5 Units at 04/01/20 0850  .  insulin glargine (LANTUS) injection 12 Units  12 Units Subcutaneous Daily Jodelle Red, MD   12 Units at 03/31/20 2120  . losartan (COZAAR) tablet 25 mg  25 mg Oral Daily Jodelle Red, MD   25 mg at 04/01/20 0851  . metoprolol tartrate (LOPRESSOR) tablet 25 mg  25 mg Oral BID Jodelle Red, MD   25 mg at 04/01/20 0851  . multivitamin with minerals tablet 1 tablet  1 tablet Oral Daily Jodelle Red, MD   1 tablet at 04/01/20 0851  . nicotine (NICODERM CQ - dosed in mg/24 hours) patch 21 mg  21 mg Transdermal Daily Jodelle Red, MD      . ondansetron [facility]) injection 4 mg  4 mg Intravenous Q6H PRN Jodelle Red, MD      . polyethylene glycol (MIRALAX / GLYCOLAX) packet 17 g  17 g Oral BID Jodelle Red, MD   17 g at 04/01/20 0851  . senna-docusate (Senokot-S) tablet 1 tablet  1 tablet Oral BID Jodelle Red, MD   1 tablet at 04/01/20 0851  . sodium chloride flush (NS) 0.9 % injection 3 mL  3 mL Intravenous Q12H Jodelle Red, MD   3 mL at 03/31/20 2121  . sodium chloride flush (NS) 0.9 % injection 3 mL  3 mL Intravenous PRN Jodelle Red, MD      . spironolactone (ALDACTONE) tablet 12.5 mg  12.5 mg Oral Daily Jodelle Red, MD   12.5 mg at 04/01/20 0851  . thiamine (B-1) 250 mg in sodium chloride 0.9 % 50 mL IVPB  250 mg Intravenous Q24H Jodelle Red, MD   Stopped at 04/01/20 0110    LABS: Lab Results  Component Value Date   WBC 10.6 (H) 04/01/2020   HGB 12.2 (L) 04/01/2020   HCT 36.9 (L) 04/01/2020   MCV 86.4 04/01/2020   PLT 331 04/01/2020      Component Value Date/Time   NA 130 (L) 04/01/2020 0647   K 4.0 04/01/2020 0647   CL 93 (L) 04/01/2020 0647   CO2 30 04/01/2020 0647   GLUCOSE 170 (H) 04/01/2020 0647   BUN 15 04/01/2020 0647   CREATININE 0.74 04/01/2020 0647   CALCIUM 8.7 (L) 04/01/2020 0647   GFRNONAA >60 04/01/2020 0647   GFRAA >60 04/01/2020 0647   Lab  Results  Component Value Date   INR 1.1 03/28/2020   No results found for: PTT  SOCIAL HISTORY: Social History   Socioeconomic History  . Marital status: Widowed    Spouse name: Not on file  . Number of children: Not on file  . Years of education: Not on file  . Highest education level: Not on file  Occupational History  . Not on file  Tobacco Use  . Smoking status: Current Every Day Smoker    Packs/day: 1.00    Types: Cigarettes  . Smokeless tobacco: Never Used  Vaping Use  . Vaping Use: Never used  Substance  and Sexual Activity  . Alcohol use: No  . Drug use: No  . Sexual activity: Not on file  Other Topics Concern  . Not on file  Social History Narrative  . Not on file   Social Determinants of Health   Financial Resource Strain:   . Difficulty of Paying Living Expenses:   Food Insecurity:   . Worried About Programme researcher, broadcasting/film/video in the Last Year:   . Barista in the Last Year:   Transportation Needs:   . Freight forwarder (Medical):   Marland Kitchen Lack of Transportation (Non-Medical):   Physical Activity:   . Days of Exercise per Week:   . Minutes of Exercise per Session:   Stress:   . Feeling of Stress :   Social Connections:   . Frequency of Communication with Friends and Family:   . Frequency of Social Gatherings with Friends and Family:   . Attends Religious Services:   . Active Member of Clubs or Organizations:   . Attends Banker Meetings:   Marland Kitchen Marital Status:   Intimate Partner Violence:   . Fear of Current or Ex-Partner:   . Emotionally Abused:   Marland Kitchen Physically Abused:   . Sexually Abused:     FAMILY HISTORY: Family History  Problem Relation Age of Onset  . CAD Father        died of MI in 32's    REVIEW OF SYSTEMS: Reviewed with the patient as per History of present illness. Psych: Patient denies having dental phobia.  DENTAL HISTORY: CHIEF COMPLAINT: Patient referred by Dr. Laneta Simmers for dental consultation.  HPI: Scott Rios is a 64 year old male recently diagnosed with severe aortic insufficiency, moderate to severe mitral regurgitation, three-vessel coronary artery disease.  Patient with anticipated aortic valve replacement, mitral valve repair, and coronary artery bypass graft procedure with Dr. Laneta Simmers.  Patient is now seen as part of medically necessary preheart valve surgery dental protocol examination.  Patient currently denies acute toothaches, swellings, or abscesses.  Patient last saw dentist more than 15 years ago patient had a tooth pulled at that time with no complications.  Patient denies having any dentures.  Patient denies having dental phobia.  Patient denies knowing that he has upper right and upper left third molar impactions.   DENTAL EXAMINATION: GENERAL: The patient is well-developed, well-nourished male in no acute distress HEAD AND NECK: No palpable neck lymphadenopathy.  The patient denies acute TMJ symptoms INTRAORAL EXAM: Patient has normal saliva.  There is no evidence of oral abscess formation.  Patient has bilateral mandibular lingual tori. DENTITION: Patient has impacted tooth numbers 1 and 16 that are NOT exposed to the oral environment.  Patient also has retained root segments in the area tooth numbers 11, 22, 23, 26, 27, 28, and 29.  All other teeth appear to be missing. PERIODONTAL: Patient has chronic periodontitis with plaque calculus accumulations, gingival recession, and moderate bone loss. DENTAL CARIES/SUBOPTIMAL RESTORATIONS: Patient has extensive caries involving retained root segments. ENDODONTIC: Patient denies having acute pulpitis symptoms.  Patient does appear to have periapical pathology associated with retained root segments. CROWN AND BRIDGE: There are no crown or bridge restorations PROSTHODONTIC: Patient denies having upper and lower complete dentures but is interested in having complete dentures in the future. OCCLUSION: Patient has a poor occlusal scheme  secondary to multiple missing teeth, presence of multiple retained root segments, and lack replacing the missing teeth with dental prostheses  RADIOGRAPHIC INTERPRETATION: An orthopantogram  was taken.  This is suboptimal. There are impacted tooth numbers 1 and 16.  There are multiple retained root segments noted although clinical detail is lacking secondary to suboptimal orthopantogram.  There is periapical radiolucency associated the retained root segment in the area of #29.   ASSESSMENTS: 1.  Severe aortic insufficiency 2.  Three-vessel coronary disease 3.  Moderate to severe mitral regurgitation 4.  Preheart valve surgery dental protocol  5.  Chronic apical periodontitis 6.  Multiple retained root segments 7.  Dental caries 8.  Impacted tooth numbers 1 and 16 9.  Multiple missing teeth 10.  Chronic periodontitis with bone loss 11.  Gingival recession 12.  Accretions 13.  No history of dentures 14.  Poor occlusal scheme and malocclusion 15.  Bilateral mandibular lingual tori 16.  Risk for complications with anticipated invasive dental procedures in the operating room with general anesthesia up to and including death due to his overall cardiovascular compromise.   PLAN/RECOMMENDATIONS: 1. I discussed the risks, benefits, and complications of various treatment options with the patient in relationship to his medical and dental conditions, anticipated coronary artery bypass graft and heart valve replacement/repair procedures, and risk for endocarditis. We discussed various treatment options to include no treatment, multiple extractions with alveoloplasty, pre-prosthetic surgery as indicated, periodontal therapy, dental restorations, root canal therapy, crown and bridge therapy, implant therapy, and replacement of missing teeth as indicated.  We also discussed leaving the impacted tooth numbers 1 and 16 for an oral surgeon to remove in the future if so desired.  The patient currently wishes  to proceed with all remaining retaining teeth (with the exception of tooth numbers 1 and 16 ) along with alveoloplasty and bilateral mandibular lingual tori reductions in the operating room with general anesthesia.  This has been scheduled for Friday, 04/03/2020 at 7:30 AM [facility].  The patient will then proceed with heart valve surgery at the discretion of Dr. Laneta Simmers after adequate healing from the dental extractions.  Patient understands that he will need to follow-up with a dentist of his choice for fabrication of upper and lower complete dentures after adequate healing and once medically stable from anticipated heart valve surgery.  Patient may also consult an oral surgeon for extraction of impacted wisdom teeth 1 and 16 as indicated.   2. Discussion of findings with medical team and coordination of future medical and dental care as needed.    Charlynne Pander, DDS

## 2020-04-01 NOTE — Progress Notes (Signed)
Progress Note  Patient Name: Scott Rios Date of Encounter: 04/01/2020  [facility] Cardiologist: No primary care provider on file.   Subjective   No chest pain no shortness of breath  Inpatient Medications    Scheduled Meds: . aspirin EC  81 mg Oral Daily  . atorvastatin  80 mg Oral Daily  . Chlorhexidine Gluconate Cloth  6 each Topical Daily  . cyanocobalamin  1,000 mcg Intramuscular Daily  . feeding supplement (GLUCERNA SHAKE)  237 mL Oral TID BM  . furosemide  40 mg Intravenous BID  . heparin injection (subcutaneous)  5,000 Units Subcutaneous Q8H  . insulin aspart  0-5 Units Subcutaneous QHS  . insulin aspart  0-9 Units Subcutaneous TID WC  . insulin glargine  12 Units Subcutaneous Daily  . losartan  25 mg Oral Daily  . metoprolol tartrate  25 mg Oral BID  . multivitamin with minerals  1 tablet Oral Daily  . nicotine  21 mg Transdermal Daily  . polyethylene glycol  17 g Oral BID  . senna-docusate  1 tablet Oral BID  . sodium chloride flush  3 mL Intravenous Q12H  . spironolactone  12.5 mg Oral Daily   Continuous Infusions: . sodium chloride    . thiamine injection Stopped (04/01/20 0110)   PRN Meds: sodium chloride, acetaminophen, diphenhydrAMINE, ondansetron (ZOFRAN) IV, sodium chloride flush   Vital Signs    Vitals:   03/31/20 2040 04/01/20 0555 04/01/20 0832 04/01/20 0850  BP: (!) 128/59 135/66 115/76   Pulse: 81 80 79 82  Resp: 18 17 18    Temp: 98.2 F (36.8 C) 98.1 F (36.7 C) 98.2 F (36.8 C)   TempSrc: Oral Oral Oral   SpO2: 94% 93% 95%   Weight:  67.5 kg    Height:        Intake/Output Summary (Last 24 hours) at 04/01/2020 0957 Last data filed at 04/01/2020 0610 Gross per 24 hour  Intake 444 ml  Output 2325 ml  Net -1881 ml   Last 3 Weights 04/01/2020 03/31/2020 03/31/2020  Weight (lbs) 148 lb 13.3 oz 149 lb 148 lb 9.1 oz  Weight (kg) 67.51 kg 67.586 kg 67.39 kg      Telemetry    No adverse arrhythmias- Personally  Reviewed    Physical Exam   GEN: No acute distress.   Neck: No JVD Cardiac: RRR, no murmurs, rubs, or gallops.  Respiratory: Clear to auscultation bilaterally. GI: Soft, nontender, non-distended  MS: No edema; No deformity. Neuro:  Nonfocal  Psych: Normal affect   Labs    High Sensitivity Troponin:   Recent Labs  Lab 03/27/20 2317 03/28/20 0324 03/28/20 0819 03/28/20 1006 03/29/20 0941  TROPONINIHS 27* 121* 1,479* 1,747* 629*      Chemistry Recent Labs  Lab 03/30/20 0309 03/30/20 1459 03/30/20 1500 03/31/20 0341 04/01/20 0647  NA 128*   < > 132* 132* 130*  K 4.4   < > 4.1 4.5 4.0  CL 94*  --   --  93* 93*  CO2 25  --   --  29 30  GLUCOSE 264*  --   --  88 170*  BUN 10  --   --  15 15  CREATININE 0.78  --   --  0.74 0.74  CALCIUM 8.6*  --   --  9.3 8.7*  PROT 5.6*  --   --  5.9* 5.4*  ALBUMIN 2.9*  --   --  3.1* 2.8*  AST 16  --   --  18 14*  ALT 13  --   --  16 15  ALKPHOS 74  --   --  69 73  BILITOT 0.3  --   --  0.7 0.3  GFRNONAA >60  --   --  >60 >60  GFRAA >60  --   --  >60 >60  ANIONGAP 9  --   --  10 7   < > = values in this interval not displayed.     Hematology Recent Labs  Lab 03/30/20 0309 03/30/20 1459 03/30/20 1500 03/31/20 0341 04/01/20 0647  WBC 12.0*  --   --  11.1* 10.6*  RBC 4.25  --   --  4.31 4.27  HGB 12.7*   < > 12.6* 12.8* 12.2*  HCT 36.5*   < > 37.0* 37.3* 36.9*  MCV 85.9  --   --  86.5 86.4  MCH 29.9  --   --  29.7 28.6  MCHC 34.8  --   --  34.3 33.1  RDW 13.5  --   --  13.5 13.6  PLT 356  --   --  355 331   < > = values in this interval not displayed.    BNPNo results for input(s): BNP, PROBNP in the last 168 hours.   DDimer  Recent Labs  Lab 03/28/20 2216  DDIMER 0.84*     Radiology    DG Orthopantogram  Result Date: 03/31/2020 CLINICAL DATA:  64 year old male preoperative study. EXAM: ORTHOPANTOGRAM/PANORAMIC COMPARISON:  Head CT 03/28/2020. FINDINGS: Largely absent dentition. There are solitary  bilateral posterior maxillary molars still in place. Along the mandible there is a tooth fragment on the right with periapical lucency, probably a bicuspid. No other acute osseous abnormality identified. Maxillary sinuses appear to remain well pneumatized. IMPRESSION: 1. Right mandible tooth fragment with inflammatory periapical lucency. 2. Otherwise there are only solitary posterior maxillary molars. Electronically Signed   By: Odessa FlemingH  Hall M.D.   On: 03/31/2020 14:58   CARDIAC CATHETERIZATION  Addendum Date: 03/30/2020    Prox RCA to Mid RCA lesion is 80% stenosed.  RPDA lesion is 90% stenosed.  1st Mrg lesion is 100% stenosed.  3rd Mrg lesion is 75% stenosed.  Prox Cx to Mid Cx lesion is 75% stenosed.  Mid LAD lesion is 100% stenosed.  Mid LAD to Dist LAD lesion is 70% stenosed.  2nd Diag lesion is 40% stenosed.  Prox LAD to Mid LAD lesion is 25% stenosed.  LV end diastolic pressure is normal.  There is moderate (3+) aortic regurgitation.  There is mild left ventricular systolic dysfunction.  The left ventricular ejection fraction is 35-45% by visual estimate.  Right heart results on room air: Ao sat 90%, PA sat 57%, PA pressure 28/12, mean PA 18 mm Hg; mean PCWP 17 mm Hg; CO 4.1 L/min; CI 2.33  Severe three vessel disease.  Moderate to severe aortic insufficiency. Plan for TEE tomorrow. Will need cardiac surgery consult for CABG and likely AVR. Results conveyed to his son, Aurelio BrashJoey.   Addendum Date: 03/30/2020    Prox RCA to Mid RCA lesion is 80% stenosed.  RPDA lesion is 90% stenosed.  1st Mrg lesion is 100% stenosed.  3rd Mrg lesion is 75% stenosed.  Prox Cx to Mid Cx lesion is 75% stenosed.  Mid LAD lesion is 100% stenosed.  Mid LAD to Dist LAD lesion is 70% stenosed.  2nd Diag lesion is 40% stenosed.  Prox LAD to Mid LAD lesion is 25% stenosed.  LV end diastolic pressure is normal.  There is moderate (3+) aortic regurgitation.  There is mild left ventricular systolic dysfunction.  The  left ventricular ejection fraction is 35-45% by visual estimate.  Right heart results on room air: Ao sat 90%, PA sat 57%, PA pressure 28/12, mean PA 18 mm Hg; mean PCWP 17 mm Hg; CO 4.1 L/min; CI 2.33  Severe three vessel disease.  Moderate to severe aortic insufficiency. Plan for TEE tomorrow. Will need cardiac surgery consult for CABG and likely AVR. Results conveyed to his son, Aurelio Brash.   Result Date: 03/30/2020  Prox RCA to Mid RCA lesion is 80% stenosed.  RPDA lesion is 90% stenosed.  1st Mrg lesion is 100% stenosed.  3rd Mrg lesion is 75% stenosed.  Prox Cx to Mid Cx lesion is 75% stenosed.  Mid LAD lesion is 100% stenosed.  Mid LAD to Dist LAD lesion is 70% stenosed.  2nd Diag lesion is 40% stenosed.  Prox LAD to Mid LAD lesion is 25% stenosed.  LV end diastolic pressure is normal.  There is moderate (3+) aortic regurgitation.  There is mild left ventricular systolic dysfunction.  The left ventricular ejection fraction is 35-45% by visual estimate.  Severe three vessel disease.  Moderate to severe aortic insufficiency. Plan for TEE tomorrow. Will need cardiac surgery consult for CABG and likely AVR.   DG CHEST PORT 1 VIEW  Result Date: 03/31/2020 CLINICAL DATA:  Shortness of breath. EXAM: PORTABLE CHEST 1 VIEW COMPARISON:  Chest x-ray 03/30/2020. FINDINGS: Mediastinum and hilar structures normal. Cardiomegaly. Mild pulmonary venous congestion. Bilateral interstitial prominence again noted. Interstitial edema and/or pneumonitis cannot be excluded. Low lung volumes with bibasilar atelectasis/infiltrates. Small bilateral pleural effusions again noted. No pneumothorax. No acute bony abnormality. 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 IMPRESSION: 1. Cardiomegaly with mild pulmonary venous congestion noted on today's exam. 2. Bilateral interstitial prominence again noted. Interstitial edema and/or pneumonitis cannot be excluded. 3. Low lung volumes  with persistent bibasilar atelectasis/infiltrates. Small bilateral pleural effusions again noted. Electronically Signed   By: Maisie Fus  Register   On: 03/31/2020 07:30   ECHO TEE  Result Date: 03/31/2020    TRANSESOPHOGEAL ECHO REPORT   Patient Name:   Scott Rios Date of Exam: 03/31/2020 Medical Rec #:  [id number]      Height:       67.0 in Accession #:    [id number]     Weight:       149.0 lb Date of Birth:  November 20, 1955      BSA:          1.784 m Patient Age:    64 years       BP:           113/52 mmHg Patient Gender: M              HR:           68 bpm. Exam Location:  Inpatient Procedure: 2D Echo, Cardiac Doppler, Color Doppler and 3D Echo Indications:     Aortic valve disorder  History:         Patient has prior history of Echocardiogram examinations, most                  recent 03/28/2020. Aortic Valve Disease and Mitral Valve                  Disease; Risk Factors:Current Smoker.  Sonographer:     Merton Border  Vinnie Level (AE) Referring Phys:  [id number] BRIDGETTE CHRISTOPHER Diagnosing Phys: Jodelle Red MD PROCEDURE: After discussion of the risks and benefits of a TEE, an informed consent was obtained from the patient. The transesophogeal probe was passed without difficulty through the esophogus of the patient. Local oropharyngeal anesthetic was provided with Cetacaine. Sedation performed by different physician. The patient was monitored while under deep sedation. Anesthestetic sedation was provided intravenously by Anesthesiology: 190mg  of Propofol, 60mg  of Lidocaine. Image quality was good. The patient  developed no complications during the procedure. IMPRESSIONS  1. Both global hypokinesis and focal wall motion abnormalities noted, most prominent in septal and inferior walls.. Left ventricular ejection fraction, by estimation, is 35 to 40%. The left ventricle has moderately decreased function. The left ventricle  demonstrates global hypokinesis. The left ventricular internal cavity size was mildly  dilated. There is mild left ventricular hypertrophy.  2. Right ventricular systolic function is normal. The right ventricular size is normal.  3. No left atrial/left atrial appendage thrombus was detected.  4. Abnormal structure, appears to have some MR due to annular dilation, but there is also a focal eccentric jet of regurgitation. There appears to possible be a cleft or perforation on 3D images adjacent to P2. At least moderate MR. PISA attempted but likely inaccurate due to eccentricity and multiple jets. No vegetation. The mitral valve is abnormal. Moderate to severe mitral valve regurgitation. No evidence of mitral stenosis.  5. The aortic valve is abnormal. Aortic valve regurgitation is severe.  6. There is Moderate (Grade III) plaque involving the descending aorta. FINDINGS  Left Ventricle: Both global hypokinesis and focal wall motion abnormalities noted, most prominent in septal and inferior walls. Left ventricular ejection fraction, by estimation, is 35 to 40%. The left ventricle has moderately decreased function. The left ventricle demonstrates global hypokinesis. The left ventricular internal cavity size was mildly dilated. There is mild left ventricular hypertrophy. Right Ventricle: The right ventricular size is normal. No increase in right ventricular wall thickness. Right ventricular systolic function is normal. Left Atrium: Left atrial size was not assessed. No left atrial/left atrial appendage thrombus was detected. Right Atrium: Right atrial size was not assessed. Pericardium: There is no evidence of pericardial effusion. Mitral Valve: Abnormal structure, appears to have some MR due to annular dilation, but there is also a focal eccentric jet of regurgitation. There appears to possible be a cleft or perforation on 3D images adjacent to P2. At least moderate MR. PISA attempted but likely inaccurate due to eccentricity and multiple jets. No vegetation. The mitral valve is abnormal. Moderate to  severe mitral valve regurgitation. No evidence of mitral valve stenosis. Tricuspid Valve: The tricuspid valve is normal in structure. Tricuspid valve regurgitation is trivial. Aortic Valve: Functionally bicuspid, with calcified and fixed RCC. Severe AI based on extension of jet across LV and vena contracta size. No vegetation. The aortic valve is abnormal. Aortic valve regurgitation is severe. Aortic regurgitation PHT measures  459 msec. Pulmonic Valve: The pulmonic valve was grossly normal. Pulmonic valve regurgitation is trivial. Aorta: The aortic root is normal in size and structure. There is moderate (Grade III) plaque involving the descending aorta. IAS/Shunts: No atrial level shunt detected by color flow Doppler.  AORTIC VALVE AI PHT:      459 msec MR Peak grad:    84.6 mmHg MR Mean grad:    48.0 mmHg MR Vmax:         460.00 cm/s MR Vmean:  310.0 cm/s MR PISA:         4.02 cm MR PISA Eff ROA: 34 mm MR PISA Radius:  0.80 cm Jodelle Red MD Electronically signed by Jodelle Red MD Signature Date/Time: 03/31/2020/5:58:08 PM    Final     Cardiac Studies   Diagnostic Dominance: Right   TEE: Severe aortic insufficiency, moderate to severe mitral regurgitation, EF 35%  Patient Profile     64 y.o. male with severe aortic insufficiency moderate to severe mitral regurgitation EF 35% triple-vessel coronary artery disease awaiting CABG/aortic valve replacement/possible mitral valve repair  Assessment & Plan    CAD/non-STEMI/severe aortic insufficiency/moderate mitral regurgitation/ischemic cardiomyopathy/acute systolic heart failure/hyponatremia -Dr. Sharee Pimple note reviewed.  Awaiting CABG/AVR/potential mitral repair -Dental consult reviewed after [place]-planned extractions on Friday -Continue with current management medications reviewed.  Furosemide 40 mg IV twice daily, continue to monitor creatinine -Tobacco cessation.  He told his son to get rid of the cigarettes in the  house.  He also has not had a drink of alcohol in over 30 years.  At his peak weight he was 250 pounds.  Currently 149   For questions or updates, please contact [facility] Please consult [email or web address] for contact info under        Signed, Donato Schultz, MD  04/01/2020, 9:57 AM

## 2020-04-01 NOTE — Progress Notes (Signed)
Pre-CABG testing has been completed. Preliminary results can be found in CV Proc through chart review.   04/01/20 12:21 PM Olen Cordial RVT

## 2020-04-01 NOTE — Progress Notes (Signed)
PROGRESS NOTE    Scott Rios  RDE:[id number] DOB: [birth year] DOA: 03/27/2020 PCP: Elfredia Nevins, MD   Brief Narrative:  HPI on 03/28/2020 by Dr. Frankey Shown Scott Rios is a 64 y.o. male with medical history significant for hypertension and diabetes not on home medication, tobacco abuse and prior history of alcohol abuse (quit 10 years ago) who presents to the emergency department due to 72-month history of right leg weakness.  Patient states that the right leg sometimes go out on him.  He complained of difficulty in being able to stand after using the toilet last evening and he ended up crawling into the living room.  Right leg was numb and weak and with more difficulty in being able to raise the leg.  He complained of chronic constipation with last bowel movement pain 8 days ago, however there was no complaint of back pain, bowel or bladder changes.  Right hand was only significant for occasional tingling sensation.  He denies fever, chills, chest pain, shortness of breath or having any sick contact.  Interim history Patient was admitted with shortness of breath which improved with Lasix.  Also complained of leg weakness which is also improving but slowly.  Cardiology consulted and recommended CTA of the chest to rule out dissection and patient also underwent cardiac catheterization as well as TEE.  Patient was noted to have severe multivessel disease and significant aortic regurgitation require CABG as well as aortic valve replacement and possible mitral valve repair.  Cardiothoracic surgery consulted.  Dentistry also consulted. Assessment & Plan   Right leg weakness/back pain -Rule out structural lesion in the brain/lumbar spine/thiamine deficiency, CIDP -Appears to be improving -CT head showed no acute abnormality -CT lumbar spine without contrast showed no acute abnormality but did show multifactorial degenerative changes at L4-5 with resulting severe spinal stenosis -Patient was  placed on high-dose thiamine -TSH 1.92, repeated 2.465.   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  No acute osseous abnormality.  Chronic L4 compression fracture deformity with mild height loss.  Moderate to severe spinal canal narrowing at L4-5 level.  Moderate right L2-3, right L4-5 neural foraminal narrowing.  L3-4 disc bulge abutment of the existing L3, descending L4 nerve roots.  L5-S1 disc bulge abutment of the extending L5 and descending S1 nerve roots. 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  Mild ectasia of the ascending aorta measuring 3.7 cm in AP diameter.  Moderate bilateral pleural effusions with bibasilar atelectasis.  Patchy groundglass opacification of the mid to upper lungs may be due to infection inflammatory process.  1.1 cm right paratracheal lymph node likely reactive. -Placed on IV Lasix -Echocardiogram showed an EF of 35 to 40%, grade 2 diastolic dysfunction along with significant AR and MR -Cardiology recommended right and left heart cath showing severe multivessel disease and severe aortic regurgitation and moderate to severe mitral regurgitation -TEE showed an EF of 35 to 40%.  No thrombus or mass noted.  Severe AI, moderate MR that is eccentric appears partially due to annular dilation as appears to  have a cleft/perforation adjacent to P2. -Continue losartan, spironolactone,  metoprolol  NSTEMI -Likely secondary to the above, troponin peaked at 1747 and trended downward to 629 -No complaints of chest pain although patient did have shortness of breath -Patient with severe CAD as noted above along with uncontrolled diabetes mellitus, type II -Cardiothoracic surgery consulted and appreciated-feels patient may benefit from CABG/AVR and mitral valve repair -Dentistry also consulted  Elevated D-dimer -Work-up as noted above -Lower extremity Dopplers negative for DVT  Severe aortic regurgitation/mitral regurgitation -Noted on TEE -Patient will benefit from replacement, cardiothoracic surgery already consulted  Diabetes mellitus, type II, uncontrolled with hyperglycemia -Hemoglobin A1c 10.2 -Please coordinator consulted and appreciated -Continue insulin sliding scale and CBG monitoring  Hypomagnesemia -Resolved with replacement, continue to monitor and supplement as needed  Hypoalbuminemia -Nutrition consulted and appreciated  Incidental pulmonary nodule/paratracheal lymph node -Noted on imaging noted above -Patient will need to follow-up outpatient  Tobacco abuse  -Smoking cessation -Continue nicotine patch  Hyponatremia -Continue to monitor BMP  Leukocytosis -Suspect reactive, has resolved  Normocytic anemia -Stable, continue to monitor CBC  DVT Prophylaxis  heparin  Code Status: Full  Family Communication:  None at bedside  Disposition Plan:  Status is: Inpatient  Remains inpatient appropriate because:Ongoing diagnostic testing needed not appropriate for outpatient work up and Inpatient level of care appropriate due to severity of illness   Dispo: The patient is from: Home              Anticipated d/c is to: Home w/ [facility]              Anticipated d/c date is: > 3 days              Patient currently is not medically stable to d/c.  Consultants Cardiology Neurology Cardiothoracic surgery Dentistry  Procedures   Echocardiogram TEE Right and left heart catheterization Lower extremity Doppler  Antibiotics   Anti-infectives (From admission, onward)   Start     Dose/Rate Route Frequency Ordered Stop   04/02/20 0400  vancomycin (VANCOREADY) IVPB 1250 mg/250 mL  Status:  Discontinued        1,250 mg 166.7 mL/hr over 90 Minutes Intravenous To Surgery 04/01/20 1045 04/01/20 1142   04/02/20 0400  levofloxacin (LEVAQUIN) IVPB 500 mg  Status:  Discontinued        500 mg 100 mL/hr over 60 Minutes Intravenous To Surgery 04/01/20 1045 04/01/20 1142      Subjective:   Scott Rios seen and examined today.    Patient denies current chest pain or shortness of breath.  Denies current abdominal pain, nausea or vomiting, diarrhea constipation, dizziness or headache.  Continues to have back pain but feels his right leg weakness is improving.  Feels overwhelmed.  Objective:   Vitals:   04/01/20 0555 04/01/20 0832 04/01/20 0850 04/01/20 1108  BP: 135/66 115/76  (!) 117/51  Pulse: 80 79 82 70  Resp: 17 18  18   Temp: 98.1 F (36.7 C) 98.2 F (36.8 C)  98 F (36.7 C)  TempSrc: Oral Oral  Oral  SpO2: 93% 95%  95%  Weight: 67.5 kg     Height:        Intake/Output Summary (Last 24 hours) at 04/01/2020 1247 Last data filed at 04/01/2020 0610 Gross per 24 hour  Intake 444 ml  Output 2325 ml  Net -1881 ml   Filed Weights   03/31/20 0338 03/31/20 0907 04/01/20 0555  Weight: 67.4 kg 67.6 kg 67.5 kg  Exam  General: Well developed, well nourished, NAD, appears stated age  HEENT: NCAT, mucous membranes moist.  Poor dentition  Cardiovascular: S1 S2 auscultated, RRR  Respiratory: Diminished breath sounds, no wheezing  Abdomen: Soft, nontender, nondistended, + bowel sounds  Extremities: warm dry without cyanosis clubbing or edema  Neuro: AAOx3, nonfocal  Psych: anxious, however appropriate mood and affect   Data Reviewed: I have personally reviewed following labs and imaging  studies  CBC: Recent Labs  Lab 03/27/20 2317 03/27/20 2317 03/28/20 0324 03/28/20 0324 03/29/20 0941 03/29/20 0941 03/30/20 0309 03/30/20 1459 03/30/20 1500 03/31/20 0341 04/01/20 0647  WBC 10.6*   < > 13.2*  --  13.7*  --  12.0*  --   --  11.1* 10.6*  NEUTROABS 7.5  --   --   --  10.7*  --  8.3*  --   --  7.3 7.2  HGB 12.9*   < > 13.5   < > 12.9*   < > 12.7* 11.9* 12.6* 12.8* 12.2*  HCT 36.8*   < > 39.5   < > 36.9*   < > 36.5* 35.0* 37.0* 37.3* 36.9*  MCV 85.6   < > 86.4  --  84.6  --  85.9  --   --  86.5 86.4  PLT 319   < > 350  --  342  --  356  --   --  355 331   < > = values in this interval not displayed.   Basic Metabolic Panel: Recent Labs  Lab 03/27/20 2317 03/27/20 2317 03/28/20 0324 03/28/20 0324 03/29/20 0941 03/29/20 0941 03/30/20 0309 03/30/20 1459 03/30/20 1500 03/31/20 0341 04/01/20 0647  NA 126*   < > 129*   < > 130*   < > 128* 133* 132* 132* 130*  K 3.6   < > 4.0   < > 4.2   < > 4.4 3.9 4.1 4.5 4.0  CL 93*   < > 96*  --  96*  --  94*  --   --  93* 93*  CO2 22   < > 22  --  26  --  25  --   --  29 30  GLUCOSE 344*   < > 286*  --  221*  --  264*  --   --  88 170*  BUN 8   < > 9  --  7*  --  10  --   --  15 15  CREATININE 0.64   < > 0.52*  --  0.57*  --  0.78  --   --  0.74 0.74  CALCIUM 8.5*   < > 8.7*  --  8.9  --  8.6*  --   --  9.3 8.7*  MG 1.4*  --   --   --  1.6*  --  2.0  --   --  1.8 1.8  PHOS  --   --   --   --  3.6  --  5.4*  --   --  5.6* 4.2   < > = values in this interval not displayed.   GFR: Estimated Creatinine Clearance: 86.1 mL/min (by C-G formula based on SCr of 0.74 mg/dL). Liver Function Tests: Recent Labs  Lab 03/28/20 0324 03/29/20 0941 03/30/20 0309 03/31/20 0341 04/01/20 0647  AST 12* 14* 16 18 14*  ALT 10 11 13 16 15   ALKPHOS 62 57 74 69 73  BILITOT 0.4 0.7 0.3 0.7 0.3  PROT 6.0* 5.5* 5.6* 5.9* 5.4*  ALBUMIN 3.1* 2.9* 2.9* 3.1* 2.8*   Recent Labs  Lab 03/27/20 2317  LIPASE 17   No results for input(s):  AMMONIA in the last 168 hours. Coagulation Profile: Recent Labs  Lab 03/28/20 0324  INR 1.1   Cardiac Enzymes: No results for input(s): CKTOTAL, CKMB, CKMBINDEX, TROPONINI in the last 168 hours. BNP (last 3 results) No results for input(s): PROBNP in the last 8760 hours. HbA1C: No results for input(s): HGBA1C in the last 72 hours. CBG: Recent Labs  Lab 03/31/20 1134 03/31/20 1634 03/31/20 2100 04/01/20 0830 04/01/20 1105  GLUCAP 137* 217* 310* 294* 276*   Lipid Profile: No results for input(s): CHOL, HDL, LDLCALC, TRIG, CHOLHDL, LDLDIRECT in the last 72 hours. Thyroid Function Tests: No results for input(s): TSH, T4TOTAL, FREET4, T3FREE, THYROIDAB in the last 72 hours. Anemia Panel: No results for input(s): VITAMINB12, FOLATE, FERRITIN, TIBC, IRON, RETICCTPCT in the last 72 hours. Urine analysis:    Component Value Date/Time   COLORURINE YELLOW 09/29/2012 1710   APPEARANCEUR CLEAR 09/29/2012 1710   LABSPEC >1.030 (H) 09/29/2012 1710   PHURINE 5.5 09/29/2012 1710   GLUCOSEU >1000 (A) 09/29/2012 1710   HGBUR TRACE (A) 09/29/2012 1710   BILIRUBINUR NEGATIVE 09/29/2012 1710   KETONESUR 15 (A) 09/29/2012 1710   PROTEINUR NEGATIVE 09/29/2012 1710   UROBILINOGEN 0.2 09/29/2012 1710   NITRITE NEGATIVE 09/29/2012 1710   LEUKOCYTESUR NEGATIVE 09/29/2012 1710   Sepsis Labs: @LABRCNTIP (procalcitonin:4,lacticidven:4)  ) Recent Results (from the past 240 hour(s))  SARS Coronavirus 2 by RT PCR (hospital order, performed in [facility] hospital lab) Nasopharyngeal Nasopharyngeal Swab     Status: None   Collection Time: 03/28/20  2:20 AM   Specimen: Nasopharyngeal Swab  Result Value Ref Range Status   SARS Coronavirus 2 NEGATIVE NEGATIVE Final    Comment: (NOTE) SARS-CoV-2 target nucleic acids are NOT DETECTED.  The SARS-CoV-2 RNA is generally detectable in upper and lower respiratory specimens during the acute phase of infection. The lowest concentration of SARS-CoV-2  viral copies this assay can detect is 250 copies / mL. A negative result does not preclude SARS-CoV-2 infection and should not be used as the sole basis for treatment or other patient management decisions.  A negative result may occur with improper specimen collection / handling, submission of specimen other than nasopharyngeal swab, presence of viral mutation(s) within the areas targeted by this assay, and inadequate number of viral copies (<250 copies / mL). A negative result must be combined with clinical observations, patient history, and epidemiological information.  Fact Sheet for Patients:   03/30/20  Fact Sheet for Healthcare Providers: BoilerBrush.com.cy  This test is not yet approved or  cleared by the https://pope.com/ FDA and has been authorized for detection and/or diagnosis of SARS-CoV-2 by FDA under an Emergency Use Authorization (EUA).  This EUA will remain in effect (meaning this test can be used) for the duration of the COVID-19 declaration under Section 564(b)(1) of the Act, 21 U.S.C. section 360bbb-3(b)(1), unless the authorization is terminated or revoked sooner.  Performed at [facility], [address], [place], [place]       Radiology Studies: DG Orthopantogram  Result Date: 03/31/2020 CLINICAL DATA:  64 year old male preoperative study. EXAM: ORTHOPANTOGRAM/PANORAMIC COMPARISON:  Head CT 03/28/2020. FINDINGS: Largely absent dentition. There are solitary bilateral posterior maxillary molars still in place. Along the mandible there is a tooth fragment on the right with periapical lucency, probably a bicuspid. No other acute  osseous abnormality identified. Maxillary sinuses appear to remain well pneumatized. IMPRESSION: 1. Right mandible tooth fragment with inflammatory periapical lucency. 2. Otherwise there are only solitary posterior maxillary molars. Electronically Signed   By: Odessa Fleming M.D.   On:  03/31/2020 14:58   CARDIAC CATHETERIZATION  Addendum Date: 03/30/2020    Prox RCA to Mid RCA lesion is 80% stenosed.  RPDA lesion is 90% stenosed.  1st Mrg lesion is 100% stenosed.  3rd Mrg lesion is 75% stenosed.  Prox Cx to Mid Cx lesion is 75% stenosed.  Mid LAD lesion is 100% stenosed.  Mid LAD to Dist LAD lesion is 70% stenosed.  2nd Diag lesion is 40% stenosed.  Prox LAD to Mid LAD lesion is 25% stenosed.  LV end diastolic pressure is normal.  There is moderate (3+) aortic regurgitation.  There is mild left ventricular systolic dysfunction.  The left ventricular ejection fraction is 35-45% by visual estimate.  Right heart results on room air: Ao sat 90%, PA sat 57%, PA pressure 28/12, mean PA 18 mm Hg; mean PCWP 17 mm Hg; CO 4.1 L/min; CI 2.33  Severe three vessel disease.  Moderate to severe aortic insufficiency. Plan for TEE tomorrow. Will need cardiac surgery consult for CABG and likely AVR. Results conveyed to his son, Aurelio Brash.   Addendum Date: 03/30/2020    Prox RCA to Mid RCA lesion is 80% stenosed.  RPDA lesion is 90% stenosed.  1st Mrg lesion is 100% stenosed.  3rd Mrg lesion is 75% stenosed.  Prox Cx to Mid Cx lesion is 75% stenosed.  Mid LAD lesion is 100% stenosed.  Mid LAD to Dist LAD lesion is 70% stenosed.  2nd Diag lesion is 40% stenosed.  Prox LAD to Mid LAD lesion is 25% stenosed.  LV end diastolic pressure is normal.  There is moderate (3+) aortic regurgitation.  There is mild left ventricular systolic dysfunction.  The left ventricular ejection fraction is 35-45% by visual estimate.  Right heart results on room air: Ao sat 90%, PA sat 57%, PA pressure 28/12, mean PA 18 mm Hg; mean PCWP 17 mm Hg; CO 4.1 L/min; CI 2.33  Severe three vessel disease.  Moderate to severe aortic insufficiency. Plan for TEE tomorrow. Will need cardiac surgery consult for CABG and likely AVR. Results conveyed to his son, Aurelio Brash.   Result Date: 03/30/2020  Prox RCA to Mid RCA lesion is  80% stenosed.  RPDA lesion is 90% stenosed.  1st Mrg lesion is 100% stenosed.  3rd Mrg lesion is 75% stenosed.  Prox Cx to Mid Cx lesion is 75% stenosed.  Mid LAD lesion is 100% stenosed.  Mid LAD to Dist LAD lesion is 70% stenosed.  2nd Diag lesion is 40% stenosed.  Prox LAD to Mid LAD lesion is 25% stenosed.  LV end diastolic pressure is normal.  There is moderate (3+) aortic regurgitation.  There is mild left ventricular systolic dysfunction.  The left ventricular ejection fraction is 35-45% by visual estimate.  Severe three vessel disease.  Moderate to severe aortic insufficiency. Plan for TEE tomorrow. Will need cardiac surgery consult for CABG and likely AVR.   DG CHEST PORT 1 VIEW  Result Date: 03/31/2020 CLINICAL DATA:  Shortness of breath. EXAM: PORTABLE CHEST 1 VIEW COMPARISON:  Chest x-ray 03/30/2020. FINDINGS: Mediastinum and hilar structures normal. Cardiomegaly. Mild pulmonary venous congestion. Bilateral interstitial prominence again noted. Interstitial edema and/or pneumonitis cannot be excluded. Low lung volumes with bibasilar atelectasis/infiltrates. Small bilateral pleural effusions again noted. No  pneumothorax. No acute bony abnormality. 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 IMPRESSION: 1. Cardiomegaly with mild pulmonary venous congestion noted on today's exam. 2. Bilateral interstitial prominence again noted. Interstitial edema and/or pneumonitis cannot be excluded. 3. Low lung volumes with persistent bibasilar atelectasis/infiltrates. Small bilateral pleural effusions again noted. Electronically Signed   By: Maisie Fus  Register   On: 03/31/2020 07:30   ECHO TEE  Result Date: 03/31/2020    TRANSESOPHOGEAL ECHO REPORT   Patient Name:   Scott Rios Date of Exam: 03/31/2020 Medical Rec #:  [id number]      Height:       67.0 in Accession #:    [id number]     Weight:       149.0 lb Date of Birth:  1955-12-16      BSA:          1.784 m  Patient Age:    64 years       BP:           113/52 mmHg Patient Gender: M              HR:           68 bpm. Exam Location:  Inpatient Procedure: 2D Echo, Cardiac Doppler, Color Doppler and 3D Echo Indications:     Aortic valve disorder  History:         Patient has prior history of Echocardiogram examinations, most                  recent 03/28/2020. Aortic Valve Disease and Mitral Valve                  Disease; Risk Factors:Current Smoker.  Sonographer:     Ross Ludwig RDCS (AE) Referring Phys:  [id number] BRIDGETTE CHRISTOPHER Diagnosing Phys: Jodelle Red MD PROCEDURE: After discussion of the risks and benefits of a TEE, an informed consent was obtained from the patient. The transesophogeal probe was passed without difficulty through the esophogus of the patient. Local oropharyngeal anesthetic was provided with Cetacaine. Sedation performed by different physician. The patient was monitored while under deep sedation. Anesthestetic sedation was provided intravenously by Anesthesiology:  of Propofol,  of Lidocaine. Image quality was good. The patient  developed no complications during the procedure. IMPRESSIONS  1. Both global hypokinesis and focal wall motion abnormalities noted, most prominent in septal and inferior walls.. Left ventricular ejection fraction, by estimation, is 35 to 40%. The left ventricle has moderately decreased function. The left ventricle  demonstrates global hypokinesis. The left ventricular internal cavity size was mildly dilated. There is mild left ventricular hypertrophy.  2. Right ventricular systolic function is normal. The right ventricular size is normal.  3. No left atrial/left atrial appendage thrombus was detected.  4. Abnormal structure, appears to have some MR due to annular dilation, but there is also a focal eccentric jet of regurgitation. There appears to possible be a cleft or perforation on 3D images adjacent to P2. At least moderate MR. PISA attempted but  likely inaccurate due to eccentricity and multiple jets. No vegetation. The mitral valve is abnormal. Moderate to severe mitral valve regurgitation. No evidence of mitral stenosis.  5. The aortic valve is abnormal. Aortic valve regurgitation is severe.  6. There is Moderate (Grade III) plaque involving the descending aorta. FINDINGS  Left Ventricle: Both global hypokinesis and focal wall motion abnormalities noted, most prominent in septal and inferior walls. Left ventricular ejection fraction, by  estimation, is 35 to 40%. The left ventricle has moderately decreased function. The left ventricle demonstrates global hypokinesis. The left ventricular internal cavity size was mildly dilated. There is mild left ventricular hypertrophy. Right Ventricle: The right ventricular size is normal. No increase in right ventricular wall thickness. Right ventricular systolic function is normal. Left Atrium: Left atrial size was not assessed. No left atrial/left atrial appendage thrombus was detected. Right Atrium: Right atrial size was not assessed. Pericardium: There is no evidence of pericardial effusion. Mitral Valve: Abnormal structure, appears to have some MR due to annular dilation, but there is also a focal eccentric jet of regurgitation. There appears to possible be a cleft or perforation on 3D images adjacent to P2. At least moderate MR. PISA attempted but likely inaccurate due to eccentricity and multiple jets. No vegetation. The mitral valve is abnormal. Moderate to severe mitral valve regurgitation. No evidence of mitral valve stenosis. Tricuspid Valve: The tricuspid valve is normal in structure. Tricuspid valve regurgitation is trivial. Aortic Valve: Functionally bicuspid, with calcified and fixed RCC. Severe AI based on extension of jet across LV and vena contracta size. No vegetation. The aortic valve is abnormal. Aortic valve regurgitation is severe. Aortic regurgitation PHT measures  459 msec. Pulmonic Valve: The  pulmonic valve was grossly normal. Pulmonic valve regurgitation is trivial. Aorta: The aortic root is normal in size and structure. There is moderate (Grade III) plaque involving the descending aorta. IAS/Shunts: No atrial level shunt detected by color flow Doppler.  AORTIC VALVE AI PHT:      459 msec MR Peak grad:    84.6 mmHg MR Mean grad:    48.0 mmHg MR Vmax:         460.00 cm/s MR Vmean:        310.0 cm/s MR PISA:         4.02 cm MR PISA Eff ROA: 34 mm MR PISA Radius:  0.80 cm Jodelle Red MD Electronically signed by Jodelle Red MD Signature Date/Time: 03/31/2020/5:58:08 PM    Final    VAS US DOPPLER PRE CABG  Result Date: 04/01/2020 PREOPERATIVE VASCULAR EVALUATION  Indications:      Pre-CABG. Risk Factors:     Current smoker. Comparison Study: No prior studies. Performing Technologist: Olen Cordial RVT  Examination Guidelines: A complete evaluation includes B-mode imaging, spectral Doppler, color Doppler, and power Doppler as needed of all accessible portions of each vessel. Bilateral testing is considered an integral part of a complete examination. Limited examinations for reoccurring indications may be performed as noted.  Right Carotid Findings: +----------+--------+--------+--------+-----------------------+--------+           PSV cm/sEDV cm/sStenosisDescribe               Comments +----------+--------+--------+--------+-----------------------+--------+ CCA Prox  60      7               smooth and heterogenous         +----------+--------+--------+--------+-----------------------+--------+ CCA Distal63      7               smooth and heterogenous         +----------+--------+--------+--------+-----------------------+--------+ ICA Prox  85      12              smooth and heterogenous         +----------+--------+--------+--------+-----------------------+--------+ ICA Distal52      13  tortuous  +----------+--------+--------+--------+-----------------------+--------+ ECA       132     0                                               +----------+--------+--------+--------+-----------------------+--------+ Portions of this table do not appear on this page. +----------+--------+-------+--------+------------+           PSV cm/sEDV cmsDescribeArm Pressure +----------+--------+-------+--------+------------+ Subclavian67                                  +----------+--------+-------+--------+------------+ +---------+--------+--+--------+-+---------+ VertebralPSV cm/s48EDV cm/s8Antegrade +---------+--------+--+--------+-+---------+ Left Carotid Findings: +----------+--------+--------+--------+-----------------------+--------+           PSV cm/sEDV cm/sStenosisDescribe               Comments +----------+--------+--------+--------+-----------------------+--------+ CCA Prox  77      12              smooth and heterogenous         +----------+--------+--------+--------+-----------------------+--------+ CCA Distal84      13              smooth and heterogenous         +----------+--------+--------+--------+-----------------------+--------+ ICA Prox  83      22              smooth and heterogenous         +----------+--------+--------+--------+-----------------------+--------+ ICA Distal62      18                                     tortuous +----------+--------+--------+--------+-----------------------+--------+ ECA       96                                                      +----------+--------+--------+--------+-----------------------+--------+ +----------+--------+--------+--------+------------+ SubclavianPSV cm/sEDV cm/sDescribeArm Pressure +----------+--------+--------+--------+------------+           97                                   +----------+--------+--------+--------+------------+ +---------+--------+--+--------+--+---------+  VertebralPSV cm/s66EDV cm/s14Antegrade +---------+--------+--+--------+--+---------+  ABI Findings: +--------+------------------+-----+---------+--------+ Right   Rt Pressure (mmHg)IndexWaveform Comment  +--------+------------------+-----+---------+--------+ [id number]                    triphasic         +--------+------------------+-----+---------+--------+ PTA     120               0.96 biphasic          +--------+------------------+-----+---------+--------+ DP      97                0.78 biphasic          +--------+------------------+-----+---------+--------+ +--------+------------------+-----+---------+-------+ Left    Lt Pressure (mmHg)IndexWaveform Comment +--------+------------------+-----+---------+-------+ [id number]                    triphasic        +--------+------------------+-----+---------+-------+ PTA     102               0.82  biphasic         +--------+------------------+-----+---------+-------+ DP      85                0.68 biphasic         +--------+------------------+-----+---------+-------+ +-------+---------------+----------------+ ABI/TBIToday's ABI/TBIPrevious ABI/TBI +-------+---------------+----------------+ Right  0.96                            +-------+---------------+----------------+ Left   0.82                            +-------+---------------+----------------+  Right Doppler Findings: +--------+--------+-----+---------+--------+ Site    PressureIndexDoppler  Comments +--------+--------+-----+---------+--------+ [id number]          triphasic         +--------+--------+-----+---------+--------+ Radial               triphasic         +--------+--------+-----+---------+--------+ Ulnar                triphasic         +--------+--------+-----+---------+--------+  Left Doppler Findings: +--------+--------+-----+---------+--------+ Site    PressureIndexDoppler  Comments  +--------+--------+-----+---------+--------+ [id number]          triphasic         +--------+--------+-----+---------+--------+ Radial               triphasic         +--------+--------+-----+---------+--------+ Ulnar                triphasic         +--------+--------+-----+---------+--------+  Summary: Right Carotid: Velocities in the right ICA are consistent with a 1-39% stenosis. Left Carotid: Velocities in the left ICA are consistent with a 1-39% stenosis. Vertebrals: Bilateral vertebral arteries demonstrate antegrade flow. Right ABI: Resting right ankle-brachial index is within normal range. No evidence of significant right lower extremity arterial disease. Left ABI: Resting left ankle-brachial index indicates mild left lower extremity arterial disease. Right Upper Extremity: Doppler waveform obliterate with right radial compression. Doppler waveform obliterate with right ulnar compression. Left Upper Extremity: Doppler waveform obliterate with left radial compression. Doppler waveform obliterate with left ulnar compression.    Preliminary      Scheduled Meds: . aspirin EC  81 mg Oral Daily  . atorvastatin  80 mg Oral Daily  . Chlorhexidine Gluconate Cloth  6 each Topical Daily  . cyanocobalamin  1,000 mcg Intramuscular Daily  . feeding supplement (GLUCERNA SHAKE)  237 mL Oral TID BM  . furosemide  40 mg Intravenous BID  . heparin injection (subcutaneous)  5,000 Units Subcutaneous Q8H  . insulin aspart  0-5 Units Subcutaneous QHS  . insulin aspart  0-9 Units Subcutaneous TID WC  . insulin glargine  12 Units Subcutaneous Daily  . losartan  25 mg Oral Daily  . metoprolol tartrate  25 mg Oral BID  . multivitamin with minerals  1 tablet Oral Daily  . nicotine  21 mg Transdermal Daily  . polyethylene glycol  17 g Oral BID  . senna-docusate  1 tablet Oral BID  . sodium chloride flush  3 mL Intravenous Q12H  . spironolactone  12.5 mg Oral Daily   Continuous Infusions: .  sodium chloride    . thiamine injection Stopped (04/01/20 0110)     LOS: 4 days   Time Spent in minutes   45 minutes  Nhyira Leano D.O. on 04/01/2020 at 12:47 PM  Between 7am  to 7pm - Please see pager noted on [email or web address]  After 7pm go to [email or web address]  And look for the night coverage person covering for me after hours  [facility] Office  [phone]

## 2020-04-01 NOTE — Progress Notes (Addendum)
Inpatient Diabetes Program Recommendations  AACE/ADA: New Consensus Statement on Inpatient Glycemic Control (2015)  Target Ranges:  Prepandial:   less than 140 mg/dL      Peak postprandial:   less than 180 mg/dL (1-2 hours)      Critically ill patients:  140 - 180 mg/dL   Lab Results  Component Value Date   GLUCAP 294 (H) 04/01/2020   HGBA1C 10.2 (H) 03/28/2020    Review of Glycemic Control  Results for COBURN, KNAUS (MRN [id number]) as of 04/01/2020 10:44  Ref. Range 03/31/2020 11:34 03/31/2020 16:34 03/31/2020 21:00 04/01/2020 08:30  Glucose-Capillary Latest Ref Range: 70 - 99 mg/dL 137 (H) 217 (H) 310 (H) 294 (H)   Diabetes history: Type 2 DM Outpatient Diabetes medications: none  Current orders for Inpatient glycemic control: Lantus 12 units QHS, Novolog 0-9 units TID, Novolog 0-5 units QHS  Inpatient Diabetes Program Recommendations:    Consider adding Novolog 3 units TID (assuming patient is consuming >50% of meal) and increasing Lantus to 14 units QHS.   Blood glucose meter kit (inlcudes lancets and strips) (#[id number]).  At discharge, anticipate need for oral anti-diabetic medications. Consider: Metformin 500 mg BID and Glipizide 5 mg QD.   Thanks, [name], MSN, RNC-OB Diabetes Coordinator [phone] (8a-5p)

## 2020-04-01 NOTE — Progress Notes (Signed)
CARDIAC REHAB PHASE I   PRE:  Rate/Rhythm: 75 SR    BP: sitting 132/68    SaO2: 95 RA  MODE:  Ambulation: 260 ft   POST:  Rate/Rhythm: 89 SR    BP: sitting 143/65     SaO2: 96 RA  Pt eager to ambulate. Able to stand and walk with RW. Fairly steady, able to conserve energy appropriately, no major c/o. VSS, return to recliner. On return to room, discussed sternal precautions, IS (has been practicing, consistently hitting 1000 mL x5 often), mobility post op and d/c planning. He had read materials and has good understanding. His son lives with him. 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 Pt sts he has a supportive neighbor as well that can check on him. Pt practiced standing following sternal precautions. He was unsuccessful on first attempt with legs buckling. Second attempt I placed my hand on arm and RW in front of him for security. He was able to rock to stand with min assist. Encouraged continued practice, ambulation, and IS. [phone]  Harriet Masson CES, ACSM 04/01/2020 2:34 PM

## 2020-04-02 ENCOUNTER — Encounter (HOSPITAL_COMMUNITY): Payer: Self-pay | Admitting: Cardiology

## 2020-04-02 DIAGNOSIS — I2510 Atherosclerotic heart disease of native coronary artery without angina pectoris: Secondary | ICD-10-CM | POA: Diagnosis not present

## 2020-04-02 DIAGNOSIS — I5021 Acute systolic (congestive) heart failure: Secondary | ICD-10-CM | POA: Diagnosis not present

## 2020-04-02 LAB — BASIC METABOLIC PANEL
Anion gap: 9 (ref 5–15)
BUN: 15 mg/dL (ref 8–23)
CO2: 31 mmol/L (ref 22–32)
Calcium: 9.3 mg/dL (ref 8.9–10.3)
Chloride: 91 mmol/L — ABNORMAL LOW (ref 98–111)
Creatinine, Ser: 0.74 mg/dL (ref 0.61–1.24)
GFR calc Af Amer: >60 mL/min (ref >60)
GFR calc non Af Amer: >60 mL/min (ref >60)
Glucose, Bld: 154 mg/dL — ABNORMAL HIGH (ref 70–99)
Potassium: 4.4 mmol/L (ref 3.5–5.1)
Sodium: 131 mmol/L — ABNORMAL LOW (ref 135–145)

## 2020-04-02 LAB — CBC
HCT: 37.8 % — ABNORMAL LOW (ref 39.0–52.0)
Hemoglobin: 12.6 g/dL — ABNORMAL LOW (ref 13.0–17.0)
MCH: 29 pg (ref 26.0–34.0)
MCHC: 33.3 g/dL (ref 30.0–36.0)
MCV: 86.9 fL (ref 80.0–100.0)
Platelets: 344 10*3/uL (ref 150–400)
RBC: 4.35 MIL/uL (ref 4.22–5.81)
RDW: 13.3 % (ref 11.5–15.5)
WBC: 11 10*3/uL — ABNORMAL HIGH (ref 4.0–10.5)
nRBC: 0 % (ref 0.0–0.2)

## 2020-04-02 LAB — GLUCOSE, CAPILLARY
Glucose-Capillary: 145 mg/dL — ABNORMAL HIGH (ref 70–99)
Glucose-Capillary: 262 mg/dL — ABNORMAL HIGH (ref 70–99)
Glucose-Capillary: 296 mg/dL — ABNORMAL HIGH (ref 70–99)
Glucose-Capillary: 190 mg/dL — ABNORMAL HIGH (ref 70–99)

## 2020-04-02 MED ORDER — ACETAMINOPHEN 500 MG PO TABS
1000.0000 mg | ORAL_TABLET | Freq: Once | ORAL | Status: AC
  Administered 2020-04-03: 1000 mg via ORAL
  Filled 2020-04-02: qty 2

## 2020-04-02 NOTE — Progress Notes (Signed)
Occupational Therapy Treatment Patient Details Name: Scott Rios MRN: [id number] DOB: 01-Oct-1955 Today's Date: 04/02/2020    History of present illness 64 yo male with a hx of DM and HTN, ETOH abuse and tobacco abuse who is being seen in consultation for the evaluation of acute pulmonary edema, LV dysfunction, moderate MR and severe AR. Pt awaiting CABG/AVR/potential mitral repair at some point after dental extractions on 8/20.   OT comments  This 64 yo male admitted with above and awaiting above presents to acute with working on sit<>stand from various surfaces without use of Bil UEs (due to pending CABG), working on standing balance and LB ADLs. He will continue to benefit from acute OT with follow up HHOT.(pending how he does post CABG).  Follow Up Recommendations  Home health OT;Supervision/Assistance - 24 hour    Equipment Recommendations  3 in 1 bedside commode       Precautions / Restrictions Precautions Precautions: Fall Precaution Comments: monitor HR and sats (stable on 8/19) Restrictions Weight Bearing Restrictions: No       Mobility Bed Mobility Overal bed mobility: Independent (without use arms with HOB ~30 degrees)                Transfers Overall transfer level: Needs assistance Equipment used: Rolling walker (2 wheeled) Transfers: Sit to/from Stand Sit to Stand: Min assist;Min guard         General transfer comment: depends on height of surface (easier from higher than lower ). Pt trying to do all surface heights without using his arms    Balance Overall balance assessment: Needs assistance Sitting-balance support: No upper extremity supported;Feet supported Sitting balance-Leahy Scale: Good     Standing balance support: No upper extremity supported;During functional activity Standing balance-Leahy Scale: Fair Standing balance comment: standing at sink to wash face, definitely his ankles were working hard on his balance as you could see the  sway in them                           ADL either performed or assessed with clinical judgement   ADL Overall ADL's : Needs assistance/impaired     Grooming: Min guard;Standing;Wash/dry face                 Lower Body Dressing Details (indicate cue type and reason): Min A sit<>stand. He can cross his LLE over RLE well, but has to use momentum or his hands to get his RLE crossed over LLE (for doffing and donning socks) Toilet Transfer: Minimal assistance;Ambulation;RW;Regular Teacher, adult education Details (indicate cue type and reason): Pt trying to stand not using his arms as he will have to do post CABG from standard height toilet--needed Min A                 Vision Patient Visual Report: No change from baseline            Cognition Arousal/Alertness: Awake/alert Behavior During Therapy: WFL for tasks assessed/performed Overall Cognitive Status: Within Functional Limits for tasks assessed                                                     Pertinent Vitals/ Pain       Pain Assessment: No/denies pain         Frequency  Min 2X/week        Progress Toward Goals  OT Goals(current goals can now be found in the care plan section)  Progress towards OT goals: Progressing toward goals     Plan Discharge plan remains appropriate       AM-PAC OT "6 Clicks" Daily Activity     Outcome Measure   Help from another person eating meals?: None Help from another person taking care of personal grooming?: A Little Help from another person toileting, which includes using toliet, bedpan, or urinal?: A Little Help from another person bathing (including washing, rinsing, drying)?: A Little Help from another person to put on and taking off regular upper body clothing?: A Little Help from another person to put on and taking off regular lower body clothing?: A Little 6 Click Score: 19    End of Session Equipment Utilized During  Treatment: Rolling walker;Gait belt  OT Visit Diagnosis: Muscle weakness (generalized) (M62.81);Unsteadiness on feet (R26.81)   Activity Tolerance Patient tolerated treatment well   Patient Left  (sitting EOB)   Nurse Communication          Time: 2080-2233 OT Time Calculation (min): 16 min  Charges: OT General Charges $OT Visit: 1 Visit OT Treatments $Self Care/Home Management : 8-22 mins  Ignacia Palma, OTR/L Acute [employer] Pager [phone] Office [phone]      Evette Georges 04/02/2020, 12:22 PM

## 2020-04-02 NOTE — Progress Notes (Signed)
Physical Therapy Treatment Patient Details Name: Scott Rios MRN: [id number] DOB: 07-Jan-1956 Today's Date: 04/02/2020    History of Present Illness 64 y.o. male with a hx of DM and HTN, ETOH abuse and tobacco abuseAdmitted 03/27/20 for treatment of acute pulmonary edema, LV dysfunction, moderate MR and severe AR.    PT Comments    Pt sitting in chair on entry. Discussed with pt what his deficits are and what it is keeping him from doing. Pt reports he had to give up golf because of his decreased balance. Worked with pt on balance activities. Pt then requested to walk down hall before dinner. Pt limited in safe mobility by R LE weakness, decreased balance and generalized weakness. Pt is min guard for transfers and ambulation with RW. D/c plans remain appropriate. PT will continue to follow acutely.     Follow Up Recommendations  Home health PT;Supervision/Assistance - 24 hour     Equipment Recommendations  Rolling walker with 5" wheels       Precautions / Restrictions Precautions Precautions: Fall Precaution Comments: monitor HR and sats (stable on 8/19)  Restrictions Weight Bearing Restrictions: No    Mobility  Bed Mobility Overal bed mobility: Needs Assistance             General bed mobility comments: up in chair when PT arrived  Transfers Overall transfer level: Needs assistance Equipment used: Rolling walker (2 wheeled) Transfers: Sit to/from Stand Sit to Stand: Min guard         General transfer comment: min guard for safety   Ambulation/Gait Ambulation/Gait assistance: Min guard Gait Distance (Feet): 300 Feet Assistive device: Rolling walker (2 wheeled) Gait Pattern/deviations: Step-through pattern;Decreased stride length;Drifts right/left;Trunk flexed Gait velocity: WFL Gait velocity interpretation: <1.8 ft/sec, indicate of risk for recurrent falls General Gait Details: min guard for safety, pt with increased difficulty with advancement of R LE after  approx 250 feet, pt with standing rest break and able to progress with improved R LE function       Balance Overall balance assessment: Needs assistance Sitting-balance support: No upper extremity supported;Feet supported Sitting balance-Leahy Scale: Good     Standing balance support: No upper extremity supported Standing balance-Leahy Scale: Fair           Rhomberg - Eyes Opened: 30 Rhomberg - Eyes Closed: 30                Cognition Arousal/Alertness: Awake/alert Behavior During Therapy: WFL for tasks assessed/performed Overall Cognitive Status: Within Functional Limits for tasks assessed                                 General Comments: motivated      Exercises Other Exercises Other Exercises: balancing in Rhomberg and sharpened Rhomberg     General Comments General comments (skin integrity, edema, etc.): VSS on RA      Pertinent Vitals/Pain Pain Assessment: No/denies pain Faces Pain Scale: No hurt           PT Goals (current goals can now be found in the care plan section) Acute Rehab PT Goals Patient Stated Goal: to go home PT Goal Formulation: With patient Time For Goal Achievement: 04/12/20 Potential to Achieve Goals: Good Progress towards PT goals: Progressing toward goals    Frequency    Min 3X/week      PT Plan Current plan remains appropriate       AM-PAC PT "6  Clicks" Mobility   Outcome Measure  Help needed turning from your back to your side while in a flat bed without using bedrails?: None Help needed moving from lying on your back to sitting on the side of a flat bed without using bedrails?: A Little Help needed moving to and from a bed to a chair (including a wheelchair)?: A Little Help needed standing up from a chair using your arms (e.g., wheelchair or bedside chair)?: A Little Help needed to walk in hospital room?: A Little Help needed climbing 3-5 steps with a railing? : A Little 6 Click Score: 19     End of Session Equipment Utilized During Treatment: Gait belt Activity Tolerance: Patient limited by fatigue Patient left: in chair;with call bell/phone within reach;with chair alarm set;with family/visitor present Nurse Communication: Mobility status PT Visit Diagnosis: Unsteadiness on feet (R26.81);Muscle weakness (generalized) (M62.81)     Time: 7858-8502 PT Time Calculation (min) (ACUTE ONLY): 24 min  Charges:  $Gait Training: 8-22 mins $Therapeutic Exercise: 8-22 mins                     Monalisa Bayless B. Beverely Risen PT, DPT Acute Rehabilitation Services Pager [phone] Office [phone]    Elon Alas Fleet 04/02/2020, 6:10 PM

## 2020-04-02 NOTE — Progress Notes (Signed)
Progress Note  Patient Name: Scott Rios Date of Encounter: 04/02/2020  [facility] Cardiologist: No primary care provider on file.   Subjective   No CP. No SOB, was sleeping, got up to urinate.  Inpatient Medications    Scheduled Meds: . aspirin EC  81 mg Oral Daily  . atorvastatin  80 mg Oral Daily  . Chlorhexidine Gluconate Cloth  6 each Topical Daily  . cyanocobalamin  1,000 mcg Intramuscular Daily  . feeding supplement (GLUCERNA SHAKE)  237 mL Oral TID BM  . furosemide  40 mg Intravenous BID  . heparin injection (subcutaneous)  5,000 Units Subcutaneous Q8H  . insulin aspart  0-5 Units Subcutaneous QHS  . insulin aspart  0-9 Units Subcutaneous TID WC  . insulin glargine  14 Units Subcutaneous Daily  . losartan  25 mg Oral Daily  . metoprolol tartrate  25 mg Oral BID  . multivitamin with minerals  1 tablet Oral Daily  . nicotine  21 mg Transdermal Daily  . polyethylene glycol  17 g Oral BID  . senna-docusate  1 tablet Oral BID  . sodium chloride flush  3 mL Intravenous Q12H  . spironolactone  12.5 mg Oral Daily   Continuous Infusions: . sodium chloride    . clindamycin (CLEOCIN) IV    . thiamine injection 250 mg (04/01/20 2325)   PRN Meds: sodium chloride, acetaminophen, diphenhydrAMINE, ondansetron (ZOFRAN) IV, sodium chloride flush   Vital Signs    Vitals:   04/01/20 2021 04/02/20 0003 04/02/20 0544 04/02/20 0838  BP: (!) 113/53 121/63 (!) 118/58 129/65  Pulse: 73 67 78 78  Resp: 20 18 20    Temp: 98 F (36.7 C) 98.2 F (36.8 C) 97.9 F (36.6 C)   TempSrc: Oral Oral Oral   SpO2: 93% 90% 95%   Weight:   65.6 kg   Height:        Intake/Output Summary (Last 24 hours) at 04/02/2020 1027 Last data filed at 04/02/2020 04/04/2020 Gross per 24 hour  Intake 118 ml  Output 3275 ml  Net -3157 ml   Last 3 Weights 04/02/2020 04/01/2020 03/31/2020  Weight (lbs) 144 lb 9.6 oz 148 lb 13.3 oz 149 lb  Weight (kg) 65.59 kg 67.51 kg 67.586 kg      Telemetry    No  adverse arrhythmias- Personally Reviewed  Physical Exam   GEN: Well nourished, well developed, in no acute distress  HEENT: normal  Neck: no JVD, carotid bruits, or masses Cardiac: RRR; no murmurs, rubs, or gallops,no edema  Respiratory:  clear to auscultation bilaterally, normal work of breathing GI: soft, nontender, nondistended, + BS MS: no deformity or atrophy  Skin: warm and dry, no rash Neuro:  Alert and Oriented x 3, Strength and sensation are intact Psych: euthymic mood, full affect    Labs    High Sensitivity Troponin:   Recent Labs  Lab 03/27/20 2317 03/28/20 0324 03/28/20 0819 03/28/20 1006 03/29/20 0941  TROPONINIHS 27* 121* 1,479* 1,747* 629*      Chemistry Recent Labs  Lab 03/30/20 0309 03/30/20 1459 03/31/20 0341 04/01/20 0647 04/02/20 0430  NA 128*   < > 132* 130* 131*  K 4.4   < > 4.5 4.0 4.4  CL 94*  --  93* 93* 91*  CO2 25  --  29 30 31   GLUCOSE 264*  --  88 170* 154*  BUN 10  --  15 15 15   CREATININE 0.78  --  0.74 0.74 0.74  CALCIUM 8.6*  --  9.3 8.7* 9.3  PROT 5.6*  --  5.9* 5.4*  --   ALBUMIN 2.9*  --  3.1* 2.8*  --   AST 16  --  18 14*  --   ALT 13  --  16 15  --   ALKPHOS 74  --  69 73  --   BILITOT 0.3  --  0.7 0.3  --   GFRNONAA >60  --  >60 >60 >60  GFRAA >60  --  >60 >60 >60  ANIONGAP 9  --  10 7 9    < > = values in this interval not displayed.     Hematology Recent Labs  Lab 03/31/20 0341 04/01/20 0647 04/02/20 0430  WBC 11.1* 10.6* 11.0*  RBC 4.31 4.27 4.35  HGB 12.8* 12.2* 12.6*  HCT 37.3* 36.9* 37.8*  MCV 86.5 86.4 86.9  MCH 29.7 28.6 29.0  MCHC 34.3 33.1 33.3  RDW 13.5 13.6 13.3  PLT 355 331 344    BNPNo results for input(s): BNP, PROBNP in the last 168 hours.   DDimer  Recent Labs  Lab 03/28/20 2216  DDIMER 0.84*     Radiology    DG Orthopantogram  Result Date: 03/31/2020 CLINICAL DATA:  64 year old male preoperative study. EXAM: ORTHOPANTOGRAM/PANORAMIC COMPARISON:  Head CT 03/28/2020.  FINDINGS: Largely absent dentition. There are solitary bilateral posterior maxillary molars still in place. Along the mandible there is a tooth fragment on the right with periapical lucency, probably a bicuspid. No other acute osseous abnormality identified. Maxillary sinuses appear to remain well pneumatized. IMPRESSION: 1. Right mandible tooth fragment with inflammatory periapical lucency. 2. Otherwise there are only solitary posterior maxillary molars. Electronically Signed   By: 03/30/2020 M.D.   On: 03/31/2020 14:58   VAS 04/02/2020 DOPPLER PRE CABG  Result Date: 04/01/2020 PREOPERATIVE VASCULAR EVALUATION  Indications:      Pre-CABG. Risk Factors:     Current smoker. Comparison Study: No prior studies. Performing Technologist: 04/03/2020 RVT  Examination Guidelines: A complete evaluation includes B-mode imaging, spectral Doppler, color Doppler, and power Doppler as needed of all accessible portions of each vessel. Bilateral testing is considered an integral part of a complete examination. Limited examinations for reoccurring indications may be performed as noted.  Right Carotid Findings: +----------+--------+--------+--------+-----------------------+--------+           PSV cm/sEDV cm/sStenosisDescribe               Comments +----------+--------+--------+--------+-----------------------+--------+ CCA Prox  60      7               smooth and heterogenous         +----------+--------+--------+--------+-----------------------+--------+ CCA Distal63      7               smooth and heterogenous         +----------+--------+--------+--------+-----------------------+--------+ ICA Prox  85      12              smooth and heterogenous         +----------+--------+--------+--------+-----------------------+--------+ ICA Distal52      13                                     tortuous +----------+--------+--------+--------+-----------------------+--------+ ECA       132     0                                                +----------+--------+--------+--------+-----------------------+--------+  Portions of this table do not appear on this page. +----------+--------+-------+--------+------------+           PSV cm/sEDV cmsDescribeArm Pressure +----------+--------+-------+--------+------------+ Subclavian67                                  +----------+--------+-------+--------+------------+ +---------+--------+--+--------+-+---------+ VertebralPSV cm/s48EDV cm/s8Antegrade +---------+--------+--+--------+-+---------+ Left Carotid Findings: +----------+--------+--------+--------+-----------------------+--------+           PSV cm/sEDV cm/sStenosisDescribe               Comments +----------+--------+--------+--------+-----------------------+--------+ CCA Prox  77      12              smooth and heterogenous         +----------+--------+--------+--------+-----------------------+--------+ CCA Distal84      13              smooth and heterogenous         +----------+--------+--------+--------+-----------------------+--------+ ICA Prox  83      22              smooth and heterogenous         +----------+--------+--------+--------+-----------------------+--------+ ICA Distal62      18                                     tortuous +----------+--------+--------+--------+-----------------------+--------+ ECA       96                                                      +----------+--------+--------+--------+-----------------------+--------+ +----------+--------+--------+--------+------------+ SubclavianPSV cm/sEDV cm/sDescribeArm Pressure +----------+--------+--------+--------+------------+           97                                   +----------+--------+--------+--------+------------+ +---------+--------+--+--------+--+---------+ VertebralPSV cm/s66EDV cm/s14Antegrade +---------+--------+--+--------+--+---------+  ABI Findings:  +--------+------------------+-----+---------+--------+ Right   Rt Pressure (mmHg)IndexWaveform Comment  +--------+------------------+-----+---------+--------+ [id number]                    triphasic         +--------+------------------+-----+---------+--------+ PTA     120               0.96 biphasic          +--------+------------------+-----+---------+--------+ DP      97                0.78 biphasic          +--------+------------------+-----+---------+--------+ +--------+------------------+-----+---------+-------+ Left    Lt Pressure (mmHg)IndexWaveform Comment +--------+------------------+-----+---------+-------+ [id number]                    triphasic        +--------+------------------+-----+---------+-------+ PTA     102               0.82 biphasic         +--------+------------------+-----+---------+-------+ DP      85                0.68 biphasic         +--------+------------------+-----+---------+-------+ +-------+---------------+----------------+ ABI/TBIToday's ABI/TBIPrevious ABI/TBI +-------+---------------+----------------+ Right  0.96                            +-------+---------------+----------------+  Left   0.82                            +-------+---------------+----------------+  Right Doppler Findings: +--------+--------+-----+---------+--------+ Site    PressureIndexDoppler  Comments +--------+--------+-----+---------+--------+ [id number]          triphasic         +--------+--------+-----+---------+--------+ Radial               triphasic         +--------+--------+-----+---------+--------+ Ulnar                triphasic         +--------+--------+-----+---------+--------+  Left Doppler Findings: +--------+--------+-----+---------+--------+ Site    PressureIndexDoppler  Comments +--------+--------+-----+---------+--------+ [id number]          triphasic          +--------+--------+-----+---------+--------+ Radial               triphasic         +--------+--------+-----+---------+--------+ Ulnar                triphasic         +--------+--------+-----+---------+--------+  Summary: Right Carotid: Velocities in the right ICA are consistent with a 1-39% stenosis. Left Carotid: Velocities in the left ICA are consistent with a 1-39% stenosis. Vertebrals: Bilateral vertebral arteries demonstrate antegrade flow. Right ABI: Resting right ankle-brachial index is within normal range. No evidence of significant right lower extremity arterial disease. Left ABI: Resting left ankle-brachial index indicates mild left lower extremity arterial disease. Right Upper Extremity: Doppler waveform obliterate with right radial compression. Doppler waveform obliterate with right ulnar compression. Left Upper Extremity: Doppler waveform obliterate with left radial compression. Doppler waveform obliterate with left ulnar compression.  Electronically signed by Gretta Beganodd Early MD on 04/01/2020 at 5:04:17 PM.    Final     Cardiac Studies   Diagnostic Dominance: Right   TEE: Severe aortic insufficiency, moderate to severe mitral regurgitation, EF 35%  Patient Profile     64 y.o. male with severe aortic insufficiency moderate to severe mitral regurgitation EF 35% triple-vessel coronary artery disease awaiting CABG/aortic valve replacement/possible mitral valve repair  Assessment & Plan    CAD/non-STEMI/severe aortic insufficiency/moderate mitral regurgitation/ischemic cardiomyopathy/acute systolic heart failure/hyponatremia -Dr. Sharee PimpleBartle's note reviewed.  Awaiting CABG/AVR/potential mitral repair -Dental consult reviewed after [place]-planned extractions on Friday -Continue with current management medications reviewed.  Furosemide 40 mg IV twice daily, continue to monitor creatinine -Tobacco cessation.  He told his son to get rid of the cigarettes in the house.  He also has not  had a drink of alcohol in over 30 years.  At his peak weight he was 250 pounds.  Currently 149. --No changes today. Ambulated with rehab.  --Vascular studies as above.    For questions or updates, please contact [facility] Please consult [email or web address] for contact info under        Signed, Donato SchultzMark Shakeeta Godette, MD  04/02/2020, 10:27 AM

## 2020-04-02 NOTE — Progress Notes (Signed)
2 Days Post-Op Procedure(s) (LRB): TRANSESOPHAGEAL ECHOCARDIOGRAM (TEE) (N/A) Subjective: No chest pain or SOB. Scheduled for dental extractions tomorrow. Nervous about heart surgery.  Has been ambulating with walker in hall.  Objective: Vital signs in last 24 hours: Temp:  [97.7 F (36.5 C)-98.3 F (36.8 C)] 97.7 F (36.5 C) (08/19 1106) Pulse Rate:  [67-78] 70 (08/19 1106) Cardiac Rhythm: Normal sinus rhythm (08/19 0800) Resp:  [16-20] 16 (08/19 1106) BP: (109-129)/(48-65) 123/52 (08/19 1106) SpO2:  [90 %-96 %] 96 % (08/19 1106) Weight:  [65.6 kg] 65.6 kg (08/19 0544)  Hemodynamic parameters for last 24 hours:    Intake/Output from previous day: 08/18 0701 - 08/19 0700 In: 118 [P.O.:118] Out: 3275 [Urine:3275] Intake/Output this shift: No intake/output data recorded.  General appearance: alert and cooperative Heart: regular rate and rhythm, S1, S2 normal, 2/6 systolic murmur, faint diastolic murmur at apex.  Lungs: clear to auscultation bilaterally  Lab Results: Recent Labs    04/01/20 0647 04/02/20 0430  WBC 10.6* 11.0*  HGB 12.2* 12.6*  HCT 36.9* 37.8*  PLT 331 344   BMET:  Recent Labs    04/01/20 0647 04/02/20 0430  NA 130* 131*  K 4.0 4.4  CL 93* 91*  CO2 30 31  GLUCOSE 170* 154*  BUN 15 15  CREATININE 0.74 0.74  CALCIUM 8.7* 9.3    PT/INR: No results for input(s): LABPROT, INR in the last 72 hours. ABG    Component Value Date/Time   PHART 7.427 03/30/2020 1459   HCO3 28.9 (H) 03/30/2020 1500   TCO2 30 03/30/2020 1500   O2SAT 57.0 03/30/2020 1500   CBG (last 3)  Recent Labs    04/01/20 2134 04/02/20 0812 04/02/20 1127  GLUCAP 199* 145* 262*    Assessment/Plan:  He has been clinically stable. Plan extractions tomorrow and then will see how he does over the weekend and decide on timing of surgery next week. I tentatively have him on the schedule for next Thursday which is the earliest that I can do it and he should be ok by then.    LOS: 5 days    Alleen Borne 04/02/2020

## 2020-04-02 NOTE — Progress Notes (Signed)
Pt just ambulated with MS. Tolerated well but reported lower BP. Will f/u as time allows. Ethelda Chick CES, ACSM 2:25 PM 04/02/2020

## 2020-04-02 NOTE — Anesthesia Preprocedure Evaluation (Addendum)
Anesthesia Evaluation  Patient identified by MRN, date of birth, ID band Patient awake    Reviewed: Allergy & Precautions, NPO status , Patient's Chart, lab work & pertinent test results  Airway Mallampati: II  TM Distance: >3 FB Neck ROM: Full    Dental  (+) Dental Advisory Given   Pulmonary Current Smoker,    breath sounds clear to auscultation       Cardiovascular hypertension, Pt. on medications  Rhythm:Regular Rate:Normal  3v CAD. Severe AI. Moderate MR, possible P2 cleft or perforation. EF 35-40%   Neuro/Psych negative neurological ROS     GI/Hepatic negative GI ROS, Neg liver ROS,   Endo/Other  diabetes  Renal/GU      Musculoskeletal   Abdominal   Peds  Hematology  (+) anemia ,   Anesthesia Other Findings   Reproductive/Obstetrics                            Lab Results  Component Value Date   WBC 11.0 (H) 04/02/2020   HGB 12.6 (L) 04/02/2020   HCT 37.8 (L) 04/02/2020   MCV 86.9 04/02/2020   PLT 344 04/02/2020   Lab Results  Component Value Date   CREATININE 0.74 04/02/2020   BUN 15 04/02/2020   NA 131 (L) 04/02/2020   K 4.4 04/02/2020   CL 91 (L) 04/02/2020   CO2 31 04/02/2020   Lab Results  Component Value Date   INR 1.1 03/28/2020    Anesthesia Physical Anesthesia Plan  ASA: IV  Anesthesia Plan: General   Post-op Pain Management:    Induction: Intravenous  PONV Risk Score and Plan: 1 and Dexamethasone, Ondansetron and Treatment may vary due to age or medical condition  Airway Management Planned: Oral ETT and Nasal ETT  Additional Equipment:   Intra-op Plan:   Post-operative Plan: Extubation in OR  Informed Consent: I have reviewed the patients History and Physical, chart, labs and discussed the procedure including the risks, benefits and alternatives for the proposed anesthesia with the patient or authorized representative who has indicated his/her  understanding and acceptance.     Dental advisory given  Plan Discussed with: CRNA  Anesthesia Plan Comments:        Anesthesia Quick Evaluation

## 2020-04-02 NOTE — Progress Notes (Signed)
PROGRESS NOTE    Scott Rios  RDE:[id number] DOB: [birth year] DOA: 03/27/2020 PCP: Elfredia Nevins, MD   Brief Narrative:  HPI on 03/28/2020 by Dr. Frankey Shown Scott Rios is a 64 y.o. male with medical history significant for hypertension and diabetes not on home medication, tobacco abuse and prior history of alcohol abuse (quit 10 years ago) who presents to the emergency department due to 72-month history of right leg weakness.  Patient states that the right leg sometimes go out on him.  He complained of difficulty in being able to stand after using the toilet last evening and he ended up crawling into the living room.  Right leg was numb and weak and with more difficulty in being able to raise the leg.  He complained of chronic constipation with last bowel movement pain 8 days ago, however there was no complaint of back pain, bowel or bladder changes.  Right hand was only significant for occasional tingling sensation.  He denies fever, chills, chest pain, shortness of breath or having any sick contact.  Interim history Patient was admitted with shortness of breath which improved with Lasix.  Also complained of leg weakness which is also improving but slowly.  Cardiology consulted and recommended CTA of the chest to rule out dissection and patient also underwent cardiac catheterization as well as TEE.  Patient was noted to have severe multivessel disease and significant aortic regurgitation require CABG as well as aortic valve replacement and possible mitral valve repair.  Cardiothoracic surgery consulted.  Dentistry also consulted. Assessment & Plan   Right leg weakness/back pain -Rule out structural lesion in the brain/lumbar spine/thiamine deficiency, CIDP -Appears to be improving -CT head showed no acute abnormality -CT lumbar spine without contrast showed no acute abnormality but did show multifactorial degenerative changes at L4-5 with resulting severe spinal stenosis -Patient was  placed on high-dose thiamine -TSH 1.92, repeated 2.465.   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  No acute osseous abnormality.  Chronic L4 compression fracture deformity with mild height loss.  Moderate to severe spinal canal narrowing at L4-5 level.  Moderate right L2-3, right L4-5 neural foraminal narrowing.  L3-4 disc bulge abutment of the existing L3, descending L4 nerve roots.  L5-S1 disc bulge abutment of the extending L5 and descending S1 nerve roots. 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  Mild ectasia of the ascending aorta measuring 3.7 cm in AP diameter.  Moderate bilateral pleural effusions with bibasilar atelectasis.  Patchy groundglass opacification of the mid to upper lungs may be due to infection inflammatory process.  1.1 cm right paratracheal lymph node likely reactive. -Placed on IV Lasix -Echocardiogram showed an EF of 35 to 40%, grade 2 diastolic dysfunction along with significant AR and MR -Cardiology recommended right and left heart cath showing severe multivessel disease and severe aortic regurgitation and moderate to severe mitral regurgitation -TEE showed an EF of 35 to 40%.  No thrombus or mass noted.  Severe AI, moderate MR that is eccentric appears partially due to annular dilation as appears to  have a cleft/perforation adjacent to P2. -Continue losartan, spironolactone,  metoprolol  NSTEMI -Likely secondary to the above, troponin peaked at 1747 and trended downward to 629 -No complaints of chest pain although patient did have shortness of breath -Patient with severe CAD as noted above along with uncontrolled diabetes mellitus, type II -Cardiothoracic surgery consulted and appreciated-feels patient may benefit from CABG/AVR and mitral valve repair -Dentistry also consulted  Elevated D-dimer -Work-up as noted above -Lower extremity Dopplers negative for DVT  Severe aortic regurgitation/mitral regurgitation -Noted on TEE -Patient will benefit from replacement, cardiothoracic surgery already consulted  Dental caries -As above, dentistry consulted -Pending surgery on 04/03/2020  Diabetes mellitus, type II, uncontrolled with hyperglycemia -Hemoglobin A1c 10.2 -Please coordinator consulted and appreciated -Continue insulin sliding scale and CBG monitoring  Hypomagnesemia -Resolved with replacement, continue to monitor and supplement as needed  Hypoalbuminemia -Nutrition consulted and appreciated  Incidental pulmonary nodule/paratracheal lymph node -Noted on imaging noted above -Patient will need to follow-up outpatient  Tobacco abuse  -Smoking cessation -Continue nicotine patch  Hyponatremia -Continue to monitor BMP  Leukocytosis -Suspect reactive, has resolved  Normocytic anemia -Stable hemoglobin currently 12.6, continue to monitor CBC  DVT Prophylaxis  heparin  Code Status: Full  Family Communication:  None at bedside  Disposition Plan:  Status is: Inpatient  Remains inpatient appropriate because:Ongoing diagnostic testing needed not appropriate for outpatient work up and Inpatient level of care appropriate due to severity of illness   Dispo: The patient is from: Home              Anticipated d/c is to: Home w/ [facility]              Anticipated d/c date is: > 3 days              Patient currently is not medically stable to  d/c.  Consultants Cardiology Neurology Cardiothoracic surgery Dentistry  Procedures  Echocardiogram TEE Right and left heart catheterization Lower extremity Doppler  Antibiotics   Anti-infectives (From admission, onward)   Start     Dose/Rate Route Frequency Ordered Stop   04/02/20 0400  vancomycin (VANCOREADY) IVPB 1250 mg/250 mL  Status:  Discontinued        1,250 mg 166.7 mL/hr over 90 Minutes Intravenous To Surgery 04/01/20 1045 04/01/20 1142   04/02/20 0400  levofloxacin (LEVAQUIN) IVPB 500 mg  Status:  Discontinued        500 mg 100 mL/hr over 60 Minutes Intravenous To Surgery 04/01/20 1045 04/01/20 1142   04/02/20 0000  clindamycin (CLEOCIN) IVPB 600 mg        600 mg 100 mL/hr over 30 Minutes Intravenous  Once 04/01/20 1850        Subjective:   Scott Rios seen and examined today.    Patient denies current chest pain or shortness of breath.  Denies current abdominal pain, nausea or vomiting, dizziness or headache.  Does feel that his right leg weakness is improving and he is able to move and walk more.  Patient feels overwhelmed and is anxious about his upcoming surgeries.    Objective:   Vitals:   04/02/20 0003 04/02/20 0544 04/02/20 0838 04/02/20 1106  BP: 121/63 (!) 118/58 129/65 (!) 123/52  Pulse: 67 78 78 70  Resp: Temp: 98.2 F (36.8 C) 97.9 F (36.6 C)  97.7 F (36.5 C)  TempSrc: Oral Oral  Oral  SpO2: 90% 95%  96%  Weight:  65.6 kg  Height:        Intake/Output Summary (Last 24 hours) at 04/02/2020 1248 Last data filed at 04/02/2020 9767 Gross per 24 hour  Intake 118 ml  Output 3275 ml  Net -3157 ml   Filed Weights   03/31/20 0907 04/01/20 0555 04/02/20 0544  Weight: 67.6 kg 67.5 kg 65.6 kg   Exam  General: Well developed, well nourished, NAD, appears stated age  HEENT: NCAT, mucous membranes moist. Poor dentition  Cardiovascular: S1 S2 auscultated, RRR.  Respiratory: Diminished breath sounds  Abdomen: Soft, nontender,  nondistended, + bowel sounds  Extremities: warm dry without cyanosis clubbing or edema  Neuro: AAOx3, nonfocal  Psych: Anxious, however appropriate mood and affect   Data Reviewed: I have personally reviewed following labs and imaging studies  CBC: Recent Labs  Lab 03/27/20 2317 03/28/20 0324 03/29/20 0941 03/29/20 0941 03/30/20 0309 03/30/20 0309 03/30/20 1459 03/30/20 1500 03/31/20 0341 04/01/20 0647 04/02/20 0430  WBC 10.6*   < > 13.7*  --  12.0*  --   --   --  11.1* 10.6* 11.0*  NEUTROABS 7.5  --  10.7*  --  8.3*  --   --   --  7.3 7.2  --   HGB 12.9*   < > 12.9*   < > 12.7*   < > 11.9* 12.6* 12.8* 12.2* 12.6*  HCT 36.8*   < > 36.9*   < > 36.5*   < > 35.0* 37.0* 37.3* 36.9* 37.8*  MCV 85.6   < > 84.6  --  85.9  --   --   --  86.5 86.4 86.9  PLT 319   < > 342  --  356  --   --   --  355 331 344   < > = values in this interval not displayed.   Basic Metabolic Panel: Recent Labs  Lab 03/27/20 2317 03/28/20 0324 03/29/20 0941 03/29/20 0941 03/30/20 0309 03/30/20 0309 03/30/20 1459 03/30/20 1500 03/31/20 0341 04/01/20 0647 04/02/20 0430  NA 126*   < > 130*   < > 128*   < > 133* 132* 132* 130* 131*  K 3.6   < > 4.2   < > 4.4   < > 3.9 4.1 4.5 4.0 4.4  CL 93*   < > 96*  --  94*  --   --   --  93* 93* 91*  CO2 22   < > 26  --  25  --   --   --  29 30 31   GLUCOSE 344*   < > 221*  --  264*  --   --   --  88 170* 154*  BUN 8   < > 7*  --  10  --   --   --  15 15 15   CREATININE 0.64   < > 0.57*  --  0.78  --   --   --  0.74 0.74 0.74  CALCIUM 8.5*   < > 8.9  --  8.6*  --   --   --  9.3 8.7* 9.3  MG 1.4*  --  1.6*  --  2.0  --   --   --  1.8 1.8  --   PHOS  --   --  3.6  --  5.4*  --   --   --  5.6* 4.2  --    < > = values in this interval not displayed.   GFR: Estimated Creatinine  Clearance: 85.4 mL/min (by C-G formula based on SCr of 0.74 mg/dL). Liver Function Tests: Recent Labs  Lab 03/28/20 0324 03/29/20 0941 03/30/20 0309 03/31/20 0341 04/01/20 0647   AST 12* 14* 16 18 14*  ALT 10 11 13 16 15   ALKPHOS 62 57 74 69 73  BILITOT 0.4 0.7 0.3 0.7 0.3  PROT 6.0* 5.5* 5.6* 5.9* 5.4*  ALBUMIN 3.1* 2.9* 2.9* 3.1* 2.8*   Recent Labs  Lab 03/27/20 2317  LIPASE 17   No results for input(s): AMMONIA in the last 168 hours. Coagulation Profile: Recent Labs  Lab 03/28/20 0324  INR 1.1   Cardiac Enzymes: No results for input(s): CKTOTAL, CKMB, CKMBINDEX, TROPONINI in the last 168 hours. BNP (last 3 results) No results for input(s): PROBNP in the last 8760 hours. HbA1C: No results for input(s): HGBA1C in the last 72 hours. CBG: Recent Labs  Lab 04/01/20 1105 04/01/20 1645 04/01/20 2134 04/02/20 0812 04/02/20 1127  GLUCAP 276* 154* 199* 145* 262*   Lipid Profile: No results for input(s): CHOL, HDL, LDLCALC, TRIG, CHOLHDL, LDLDIRECT in the last 72 hours. Thyroid Function Tests: No results for input(s): TSH, T4TOTAL, FREET4, T3FREE, THYROIDAB in the last 72 hours. Anemia Panel: No results for input(s): VITAMINB12, FOLATE, FERRITIN, TIBC, IRON, RETICCTPCT in the last 72 hours. Urine analysis:    Component Value Date/Time   COLORURINE YELLOW 09/29/2012 1710   APPEARANCEUR CLEAR 09/29/2012 1710   LABSPEC >1.030 (H) 09/29/2012 1710   PHURINE 5.5 09/29/2012 1710   GLUCOSEU >1000 (A) 09/29/2012 1710   HGBUR TRACE (A) 09/29/2012 1710   BILIRUBINUR NEGATIVE 09/29/2012 1710   KETONESUR 15 (A) 09/29/2012 1710   PROTEINUR NEGATIVE 09/29/2012 1710   UROBILINOGEN 0.2 09/29/2012 1710   NITRITE NEGATIVE 09/29/2012 1710   LEUKOCYTESUR NEGATIVE 09/29/2012 1710   Sepsis Labs: @LABRCNTIP (procalcitonin:4,lacticidven:4)  ) Recent Results (from the past 240 hour(s))  SARS Coronavirus 2 by RT PCR (hospital order, performed in [facility] hospital lab) Nasopharyngeal Nasopharyngeal Swab     Status: None   Collection Time: 03/28/20  2:20 AM   Specimen: Nasopharyngeal Swab  Result Value Ref Range Status   SARS Coronavirus 2 NEGATIVE NEGATIVE  Final    Comment: (NOTE) SARS-CoV-2 target nucleic acids are NOT DETECTED.  The SARS-CoV-2 RNA is generally detectable in upper and lower respiratory specimens during the acute phase of infection. The lowest concentration of SARS-CoV-2 viral copies this assay can detect is 250 copies / mL. A negative result does not preclude SARS-CoV-2 infection and should not be used as the sole basis for treatment or other patient management decisions.  A negative result may occur with improper specimen collection / handling, submission of specimen other than nasopharyngeal swab, presence of viral mutation(s) within the areas targeted by this assay, and inadequate number of viral copies (<250 copies / mL). A negative result must be combined with clinical observations, patient history, and epidemiological information.  Fact Sheet for Patients:   [facility]  Fact Sheet for Healthcare Providers: 03/30/20  This test is not yet approved or  cleared by the [employer] FDA and has been authorized for detection and/or diagnosis of SARS-CoV-2 by FDA under an Emergency Use Authorization (EUA).  This EUA will remain in effect (meaning this test can be used) for the duration of the COVID-19 declaration under Section 564(b)(1) of the Act, 21 U.S.C. section 360bbb-3(b)(1), unless the authorization is terminated or revoked sooner.  Performed at [facility], [address], [place], [address] [place]  Radiology Studies: DG Orthopantogram  Result Date: 03/31/2020 CLINICAL DATA:  64 year old male preoperative study. EXAM: ORTHOPANTOGRAM/PANORAMIC COMPARISON:  Head CT 03/28/2020. FINDINGS: Largely absent dentition. There are solitary bilateral posterior maxillary molars still in place. Along the mandible there is a tooth fragment on the right with periapical lucency, probably a bicuspid. No other acute osseous abnormality identified.  Maxillary sinuses appear to remain well pneumatized. IMPRESSION: 1. Right mandible tooth fragment with inflammatory periapical lucency. 2. Otherwise there are only solitary posterior maxillary molars. Electronically Signed   By: Odessa FlemingH  Hall M.D.   On: 03/31/2020 14:58   VAS US DOPPLER PRE CABG  Result Date: 04/01/2020 PREOPERATIVE VASCULAR EVALUATION  Indications:      Pre-CABG. Risk Factors:     Current smoker. Comparison Study: No prior studies. Performing Technologist: Olen Cordialollins, Greg RVT  Examination Guidelines: A complete evaluation includes B-mode imaging, spectral Doppler, color Doppler, and power Doppler as needed of all accessible portions of each vessel. Bilateral testing is considered an integral part of a complete examination. Limited examinations for reoccurring indications may be performed as noted.  Right Carotid Findings: +----------+--------+--------+--------+-----------------------+--------+           PSV cm/sEDV cm/sStenosisDescribe               Comments +----------+--------+--------+--------+-----------------------+--------+ CCA Prox  60      7               smooth and heterogenous         +----------+--------+--------+--------+-----------------------+--------+ CCA Distal63      7               smooth and heterogenous         +----------+--------+--------+--------+-----------------------+--------+ ICA Prox  85      12              smooth and heterogenous         +----------+--------+--------+--------+-----------------------+--------+ ICA Distal52      13                                     tortuous +----------+--------+--------+--------+-----------------------+--------+ ECA       132     0                                               +----------+--------+--------+--------+-----------------------+--------+ Portions of this table do not appear on this page. +----------+--------+-------+--------+------------+           PSV cm/sEDV cmsDescribeArm  Pressure +----------+--------+-------+--------+------------+ Subclavian67                                  +----------+--------+-------+--------+------------+ +---------+--------+--+--------+-+---------+ VertebralPSV cm/s48EDV cm/s8Antegrade +---------+--------+--+--------+-+---------+ Left Carotid Findings: +----------+--------+--------+--------+-----------------------+--------+           PSV cm/sEDV cm/sStenosisDescribe               Comments +----------+--------+--------+--------+-----------------------+--------+ CCA Prox  77      12              smooth and heterogenous         +----------+--------+--------+--------+-----------------------+--------+ CCA Distal84      13              smooth and heterogenous         +----------+--------+--------+--------+-----------------------+--------+ ICA  Prox  83      22              smooth and heterogenous         +----------+--------+--------+--------+-----------------------+--------+ ICA Distal62      18                                     tortuous +----------+--------+--------+--------+-----------------------+--------+ ECA       96                                                      +----------+--------+--------+--------+-----------------------+--------+ +----------+--------+--------+--------+------------+ SubclavianPSV cm/sEDV cm/sDescribeArm Pressure +----------+--------+--------+--------+------------+           97                                   +----------+--------+--------+--------+------------+ +---------+--------+--+--------+--+---------+ VertebralPSV cm/s66EDV cm/s14Antegrade +---------+--------+--+--------+--+---------+  ABI Findings: +--------+------------------+-----+---------+--------+ Right   Rt Pressure (mmHg)IndexWaveform Comment  +--------+------------------+-----+---------+--------+ [id number]                    triphasic          +--------+------------------+-----+---------+--------+ PTA     120               0.96 biphasic          +--------+------------------+-----+---------+--------+ DP      97                0.78 biphasic          +--------+------------------+-----+---------+--------+ +--------+------------------+-----+---------+-------+ Left    Lt Pressure (mmHg)IndexWaveform Comment +--------+------------------+-----+---------+-------+ [id number]                    triphasic        +--------+------------------+-----+---------+-------+ PTA     102               0.82 biphasic         +--------+------------------+-----+---------+-------+ DP      85                0.68 biphasic         +--------+------------------+-----+---------+-------+ +-------+---------------+----------------+ ABI/TBIToday's ABI/TBIPrevious ABI/TBI +-------+---------------+----------------+ Right  0.96                            +-------+---------------+----------------+ Left   0.82                            +-------+---------------+----------------+  Right Doppler Findings: +--------+--------+-----+---------+--------+ Site    PressureIndexDoppler  Comments +--------+--------+-----+---------+--------+ [id number]          triphasic         +--------+--------+-----+---------+--------+ Radial               triphasic         +--------+--------+-----+---------+--------+ Ulnar                triphasic         +--------+--------+-----+---------+--------+  Left Doppler Findings: +--------+--------+-----+---------+--------+ Site    PressureIndexDoppler  Comments +--------+--------+-----+---------+--------+ [id number]          triphasic         +--------+--------+-----+---------+--------+ Radial  triphasic         +--------+--------+-----+---------+--------+ Ulnar                triphasic         +--------+--------+-----+---------+--------+  Summary: Right Carotid:  Velocities in the right ICA are consistent with a 1-39% stenosis. Left Carotid: Velocities in the left ICA are consistent with a 1-39% stenosis. Vertebrals: Bilateral vertebral arteries demonstrate antegrade flow. Right ABI: Resting right ankle-brachial index is within normal range. No evidence of significant right lower extremity arterial disease. Left ABI: Resting left ankle-brachial index indicates mild left lower extremity arterial disease. Right Upper Extremity: Doppler waveform obliterate with right radial compression. Doppler waveform obliterate with right ulnar compression. Left Upper Extremity: Doppler waveform obliterate with left radial compression. Doppler waveform obliterate with left ulnar compression.  Electronically signed by Gretta Began MD on 04/01/2020 at 5:04:17 PM.    Final      Scheduled Meds: . aspirin EC  81 mg Oral Daily  . atorvastatin  80 mg Oral Daily  . Chlorhexidine Gluconate Cloth  6 each Topical Daily  . cyanocobalamin  1,000 mcg Intramuscular Daily  . feeding supplement (GLUCERNA SHAKE)  237 mL Oral TID BM  . furosemide  40 mg Intravenous BID  . heparin injection (subcutaneous)  5,000 Units Subcutaneous Q8H  . insulin aspart  0-5 Units Subcutaneous QHS  . insulin aspart  0-9 Units Subcutaneous TID WC  . insulin glargine  14 Units Subcutaneous Daily  . losartan  25 mg Oral Daily  . metoprolol tartrate  25 mg Oral BID  . multivitamin with minerals  1 tablet Oral Daily  . nicotine  21 mg Transdermal Daily  . polyethylene glycol  17 g Oral BID  . senna-docusate  1 tablet Oral BID  . sodium chloride flush  3 mL Intravenous Q12H  . spironolactone  12.5 mg Oral Daily   Continuous Infusions: . sodium chloride    . clindamycin (CLEOCIN) IV    . thiamine injection 250 mg (04/01/20 2325)     LOS: 5 days   Time Spent in minutes   45 minutes  Kimia Finan D.O. on 04/02/2020 at 12:48 PM  Between 7am to 7pm - Please see pager noted on [email or web address]  After 7pm go to  [email or web address]  And look for the night coverage person covering for me after hours  [facility] Office  [phone]

## 2020-04-02 NOTE — Progress Notes (Signed)
°  Mobility Specialist Criteria Algorithm Info.  Mobility Team: [facility] elevated:Self regulated Activity: Ambulated in hall (In chair before and after ambulation) Range of motion: Active;All extremities Level of assistance: Standby assist, set-up cues, supervision of patient - no hands on Assistive device: Front wheel walker Minutes sitting in chair:  Minutes stood: 5 minutes Minutes ambulated: 5 minutes Distance ambulated (ft): 320 ft Mobility response: Tolerated well;RN notified;Other (Comment) (BP low prior to ambulation, RN notified. Needed standing rest break x1 d/t faitgue in UE's) Bed Position: Chair (Recliner chair)  BP Pre: 95/60 (sitting)  HR Pre: 68 BP Post: 136/65 (sitting) HR Post: 71  Pt tolerated ambulating in hallway well with no complaints. Prior to ambulating in hall his BP was 95/60 but denied feeling any dizziness or lightheadedness.   04/02/2020 2:32 PM

## 2020-04-03 ENCOUNTER — Inpatient Hospital Stay (HOSPITAL_COMMUNITY): Payer: Medicare Other | Admitting: Anesthesiology

## 2020-04-03 ENCOUNTER — Encounter (HOSPITAL_COMMUNITY)
Admission: EM | Disposition: A | Discharge status: Home / Self Care | Payer: Self-pay | Source: Home / Self Care | Attending: Surgery

## 2020-04-03 ENCOUNTER — Inpatient Hospital Stay (HOSPITAL_COMMUNITY): Payer: Medicare Other

## 2020-04-03 HISTORY — PX: MULTIPLE EXTRACTIONS WITH ALVEOLOPLASTY: SHX5342

## 2020-04-03 LAB — BASIC METABOLIC PANEL
Anion gap: 8 (ref 5–15)
BUN: 15 mg/dL (ref 8–23)
CO2: 30 mmol/L (ref 22–32)
Calcium: 8.9 mg/dL (ref 8.9–10.3)
Chloride: 89 mmol/L — ABNORMAL LOW (ref 98–111)
Creatinine, Ser: 0.7 mg/dL (ref 0.61–1.24)
GFR calc Af Amer: >60 mL/min (ref >60)
GFR calc non Af Amer: >60 mL/min (ref >60)
Glucose, Bld: 206 mg/dL — ABNORMAL HIGH (ref 70–99)
Potassium: 4.3 mmol/L (ref 3.5–5.1)
Sodium: 127 mmol/L — ABNORMAL LOW (ref 135–145)

## 2020-04-03 LAB — GLUCOSE, CAPILLARY
Glucose-Capillary: 163 mg/dL — ABNORMAL HIGH (ref 70–99)
Glucose-Capillary: 186 mg/dL — ABNORMAL HIGH (ref 70–99)
Glucose-Capillary: 223 mg/dL — ABNORMAL HIGH (ref 70–99)
Glucose-Capillary: 312 mg/dL — ABNORMAL HIGH (ref 70–99)
Glucose-Capillary: 421 mg/dL — ABNORMAL HIGH (ref 70–99)
Glucose-Capillary: 446 mg/dL — ABNORMAL HIGH (ref 70–99)

## 2020-04-03 LAB — CBC
HCT: 36.9 % — ABNORMAL LOW (ref 39.0–52.0)
Hemoglobin: 12.7 g/dL — ABNORMAL LOW (ref 13.0–17.0)
MCH: 29.8 pg (ref 26.0–34.0)
MCHC: 34.4 g/dL (ref 30.0–36.0)
MCV: 86.6 fL (ref 80.0–100.0)
Platelets: 351 10*3/uL (ref 150–400)
RBC: 4.26 MIL/uL (ref 4.22–5.81)
RDW: 13.4 % (ref 11.5–15.5)
WBC: 10.8 10*3/uL — ABNORMAL HIGH (ref 4.0–10.5)
nRBC: 0 % (ref 0.0–0.2)

## 2020-04-03 SURGERY — MULTIPLE EXTRACTION WITH ALVEOLOPLASTY
Anesthesia: General

## 2020-04-03 MED ORDER — FENTANYL CITRATE (PF) 250 MCG/5ML IJ SOLN
INTRAMUSCULAR | Status: AC
  Filled 2020-04-03: qty 5

## 2020-04-03 MED ORDER — LIDOCAINE-EPINEPHRINE 2 %-1:100000 IJ SOLN
INTRAMUSCULAR | Status: AC
  Filled 2020-04-03: qty 10.2

## 2020-04-03 MED ORDER — LACTATED RINGERS IV SOLN: INTRAVENOUS | Status: DC | PRN

## 2020-04-03 MED ORDER — SODIUM CHLORIDE 0.9 % IV SOLN: INTRAVENOUS | Status: DC

## 2020-04-03 MED ORDER — BOOST / RESOURCE BREEZE PO LIQD CUSTOM
1.0000 | Freq: Three times a day (TID) | ORAL | Status: AC
  Administered 2020-04-03 (×2): 1 via ORAL

## 2020-04-03 MED ORDER — BUPIVACAINE-EPINEPHRINE (PF) 0.5% -1:200000 IJ SOLN
INTRAMUSCULAR | Status: AC
  Filled 2020-04-03: qty 3.6

## 2020-04-03 MED ORDER — HEMOSTATIC AGENTS (NO CHARGE) OPTIME
TOPICAL | Status: DC | PRN
  Administered 2020-04-03: 1 via TOPICAL

## 2020-04-03 MED ORDER — INSULIN ASPART 100 UNIT/ML ~~LOC~~ SOLN
12.0000 [IU] | Freq: Once | SUBCUTANEOUS | Status: AC
  Administered 2020-04-03: 12 [IU] via SUBCUTANEOUS

## 2020-04-03 MED ORDER — SUCCINYLCHOLINE CHLORIDE 200 MG/10ML IV SOSY
PREFILLED_SYRINGE | INTRAVENOUS | Status: DC | PRN
  Administered 2020-04-03: 80 mg via INTRAVENOUS

## 2020-04-03 MED ORDER — LACTATED RINGERS IV SOLN: INTRAVENOUS | Status: DC

## 2020-04-03 MED ORDER — LIDOCAINE 2% (20 MG/ML) 5 ML SYRINGE
INTRAMUSCULAR | Status: AC
  Filled 2020-04-03: qty 5

## 2020-04-03 MED ORDER — FENTANYL CITRATE (PF) 100 MCG/2ML IJ SOLN
INTRAMUSCULAR | Status: DC | PRN
Start: 2020-04-03 — End: 2020-04-03
  Administered 2020-04-03: 50 ug via INTRAVENOUS

## 2020-04-03 MED ORDER — MIDAZOLAM HCL 2 MG/2ML IJ SOLN
INTRAMUSCULAR | Status: DC | PRN
  Administered 2020-04-03: 2 mg via INTRAVENOUS

## 2020-04-03 MED ORDER — DEXAMETHASONE SODIUM PHOSPHATE 10 MG/ML IJ SOLN
INTRAMUSCULAR | Status: AC
  Filled 2020-04-03: qty 1

## 2020-04-03 MED ORDER — ROCURONIUM BROMIDE 10 MG/ML (PF) SYRINGE
PREFILLED_SYRINGE | INTRAVENOUS | Status: DC | PRN
  Administered 2020-04-03: 40 mg via INTRAVENOUS

## 2020-04-03 MED ORDER — SUGAMMADEX SODIUM 200 MG/2ML IV SOLN
INTRAVENOUS | Status: DC | PRN
  Administered 2020-04-03: 200 mg via INTRAVENOUS

## 2020-04-03 MED ORDER — EPHEDRINE 5 MG/ML INJ
INTRAVENOUS | Status: AC
  Filled 2020-04-03: qty 10

## 2020-04-03 MED ORDER — MIDAZOLAM HCL 2 MG/2ML IJ SOLN
INTRAMUSCULAR | Status: AC
  Filled 2020-04-03: qty 2

## 2020-04-03 MED ORDER — FENTANYL CITRATE (PF) 100 MCG/2ML IJ SOLN: 25.0000 ug | INTRAMUSCULAR | Status: DC | PRN

## 2020-04-03 MED ORDER — ONDANSETRON HCL 4 MG/2ML IJ SOLN
INTRAMUSCULAR | Status: DC | PRN
  Administered 2020-04-03: 4 mg via INTRAVENOUS

## 2020-04-03 MED ORDER — OXYMETAZOLINE HCL 0.05 % NA SOLN
NASAL | Status: AC
  Filled 2020-04-03: qty 30

## 2020-04-03 MED ORDER — 0.9 % SODIUM CHLORIDE (POUR BTL) OPTIME
TOPICAL | Status: DC | PRN
  Administered 2020-04-03: 1000 mL

## 2020-04-03 MED ORDER — AMINOCAPROIC ACID SOLUTION 5% (50 MG/ML)
10.0000 mL | ORAL | Status: AC
  Administered 2020-04-03 (×7): 10 mL via ORAL
  Filled 2020-04-03: qty 100

## 2020-04-03 MED ORDER — AMISULPRIDE (ANTIEMETIC) 5 MG/2ML IV SOLN: 10.0000 mg | Freq: Once | INTRAVENOUS | Status: DC | PRN

## 2020-04-03 MED ORDER — GLUCERNA SHAKE PO LIQD
237.0000 mL | Freq: Three times a day (TID) | ORAL | Status: DC
  Administered 2020-04-04 – 2020-04-08 (×11): 237 mL via ORAL
  Filled 2020-04-03 (×3): qty 237

## 2020-04-03 MED ORDER — ONDANSETRON HCL 4 MG/2ML IJ SOLN
INTRAMUSCULAR | Status: AC
  Filled 2020-04-03: qty 2

## 2020-04-03 MED ORDER — THROMBIN (RECOMBINANT) 5000 UNITS EX SOLR
CUTANEOUS | Status: AC
  Filled 2020-04-03: qty 5000

## 2020-04-03 MED ORDER — EPHEDRINE SULFATE-NACL 50-0.9 MG/10ML-% IV SOSY
PREFILLED_SYRINGE | INTRAVENOUS | Status: DC | PRN
  Administered 2020-04-03 (×2): 10 mg via INTRAVENOUS
  Administered 2020-04-03: 15 mg via INTRAVENOUS

## 2020-04-03 MED ORDER — SUCCINYLCHOLINE CHLORIDE 200 MG/10ML IV SOSY
PREFILLED_SYRINGE | INTRAVENOUS | Status: AC
  Filled 2020-04-03: qty 10

## 2020-04-03 MED ORDER — LIDOCAINE 2% (20 MG/ML) 5 ML SYRINGE
INTRAMUSCULAR | Status: DC | PRN
  Administered 2020-04-03: 60 mg via INTRAVENOUS

## 2020-04-03 MED ORDER — AMINOCAPROIC ACID SOLUTION 5% (50 MG/ML)
ORAL | Status: DC | PRN
  Administered 2020-04-03: 10 mL via ORAL

## 2020-04-03 MED ORDER — OXYMETAZOLINE HCL 0.05 % NA SOLN
NASAL | Status: DC | PRN
  Administered 2020-04-03 (×2): 2 via NASAL

## 2020-04-03 MED ORDER — DEXAMETHASONE SODIUM PHOSPHATE 10 MG/ML IJ SOLN
INTRAMUSCULAR | Status: DC | PRN
  Administered 2020-04-03: 4 mg via INTRAVENOUS

## 2020-04-03 MED ORDER — HYDROCODONE-ACETAMINOPHEN 5-325 MG PO TABS
1.0000 | ORAL_TABLET | Freq: Four times a day (QID) | ORAL | Status: DC | PRN
  Administered 2020-04-03: 1 via ORAL
  Administered 2020-04-03: 2 via ORAL
  Filled 2020-04-03: qty 2
  Filled 2020-04-03: qty 1

## 2020-04-03 MED ORDER — LIDOCAINE-EPINEPHRINE 2 %-1:100000 IJ SOLN
INTRAMUSCULAR | Status: DC | PRN
  Administered 2020-04-03: 10.2 mL

## 2020-04-03 MED ORDER — PHENYLEPHRINE HCL-NACL 10-0.9 MG/250ML-% IV SOLN
INTRAVENOUS | Status: DC | PRN
  Administered 2020-04-03: 20 ug/min via INTRAVENOUS

## 2020-04-03 MED ORDER — BUPIVACAINE-EPINEPHRINE 0.5% -1:200000 IJ SOLN
INTRAMUSCULAR | Status: DC | PRN
  Administered 2020-04-03: 3.6 mL

## 2020-04-03 MED ORDER — ROCURONIUM BROMIDE 10 MG/ML (PF) SYRINGE
PREFILLED_SYRINGE | INTRAVENOUS | Status: AC
  Filled 2020-04-03: qty 10

## 2020-04-03 SURGICAL SUPPLY — 39 items
ALCOHOL 70% 16 OZ (MISCELLANEOUS) ×3 IMPLANT
ATTRACTOMAT 16X20 MAGNETIC DRP (DRAPES) ×3 IMPLANT
BANDAGE HEMOSTAT MRDH 4X4 STRL (MISCELLANEOUS) IMPLANT
BLADE SURG 15 STRL LF DISP TIS (BLADE) ×2 IMPLANT
BLADE SURG 15 STRL SS (BLADE) ×6
BNDG HEMOSTAT MRDH 4X4 STRL (MISCELLANEOUS)
COVER SURGICAL LIGHT HANDLE (MISCELLANEOUS) ×3 IMPLANT
GAUZE 4X4 16PLY RFD (DISPOSABLE) ×3 IMPLANT
GAUZE PACKING FOLDED 2  STR (GAUZE/BANDAGES/DRESSINGS) ×3
GAUZE PACKING FOLDED 2 STR (GAUZE/BANDAGES/DRESSINGS) ×1 IMPLANT
GLOVE BIO SURGEON STRL SZ 6.5 (GLOVE) ×2 IMPLANT
GLOVE BIO SURGEONS STRL SZ 6.5 (GLOVE) ×1
GLOVE SURG ORTHO 8.0 STRL STRW (GLOVE) ×3 IMPLANT
GOWN STRL REUS W/ TWL LRG LVL3 (GOWN DISPOSABLE) ×1 IMPLANT
GOWN STRL REUS W/TWL 2XL LVL3 (GOWN DISPOSABLE) ×3 IMPLANT
GOWN STRL REUS W/TWL LRG LVL3 (GOWN DISPOSABLE) ×3
HEMOSTAT SURGICEL 2X14 (HEMOSTASIS) IMPLANT
KIT BASIN OR (CUSTOM PROCEDURE TRAY) ×3 IMPLANT
KIT TURNOVER KIT B (KITS) ×3 IMPLANT
MANIFOLD NEPTUNE II (INSTRUMENTS) ×3 IMPLANT
NEEDLE BLUNT 16X1.5 OR ONLY (NEEDLE) ×3 IMPLANT
NEEDLE DENTAL 27 LONG (NEEDLE) ×6 IMPLANT
NS IRRIG 1000ML POUR BTL (IV SOLUTION) ×3 IMPLANT
PACK EENT II TURBAN DRAPE (CUSTOM PROCEDURE TRAY) ×3 IMPLANT
PAD ARMBOARD 7.5X6 YLW CONV (MISCELLANEOUS) ×3 IMPLANT
SPONGE SURGIFOAM ABS GEL 100 (HEMOSTASIS) ×3 IMPLANT
SPONGE SURGIFOAM ABS GEL 12-7 (HEMOSTASIS) IMPLANT
SPONGE SURGIFOAM ABS GEL SZ50 (HEMOSTASIS) IMPLANT
SUCTION FRAZIER HANDLE 10FR (MISCELLANEOUS) ×3
SUCTION TUBE FRAZIER 10FR DISP (MISCELLANEOUS) ×1 IMPLANT
SUT CHROMIC 3 0 PS 2 (SUTURE) ×9 IMPLANT
SUT CHROMIC 4 0 P 3 18 (SUTURE) IMPLANT
SYR 50ML SLIP (SYRINGE) ×3 IMPLANT
TOWEL GREEN STERILE (TOWEL DISPOSABLE) ×3 IMPLANT
TUBE CONNECTING 12'X1/4 (SUCTIONS) ×1
TUBE CONNECTING 12X1/4 (SUCTIONS) ×2 IMPLANT
WATER STERILE IRR 1000ML POUR (IV SOLUTION) ×3 IMPLANT
WATER TABLETS ICX (MISCELLANEOUS) ×3 IMPLANT
YANKAUER SUCT BULB TIP NO VENT (SUCTIONS) ×3 IMPLANT

## 2020-04-03 NOTE — Progress Notes (Signed)
PRE-OPERATIVE NOTE:  04/03/2020 Scott Rios [id number]  VITALS: BP (!) 110/53 (BP Location: Left Arm)   Pulse 71   Temp 97.9 F (36.6 C) (Oral)   Resp 17   Ht 5\' 7"  (1.702 m)   Wt 65.1 kg   SpO2 93%   BMI 22.49 kg/m   Lab Results  Component Value Date   WBC 10.8 (H) 04/03/2020   HGB 12.7 (L) 04/03/2020   HCT 36.9 (L) 04/03/2020   MCV 86.6 04/03/2020   PLT 351 04/03/2020   BMET    Component Value Date/Time   NA 127 (L) 04/03/2020 0435   K 4.3 04/03/2020 0435   CL 89 (L) 04/03/2020 0435   CO2 30 04/03/2020 0435   GLUCOSE 206 (H) 04/03/2020 0435   BUN 15 04/03/2020 0435   CREATININE 0.70 04/03/2020 0435   CALCIUM 8.9 04/03/2020 0435   GFRNONAA >60 04/03/2020 0435   GFRAA >60 04/03/2020 0435    Lab Results  Component Value Date   INR 1.1 03/28/2020   No results found for: PTT   03/30/2020 presents for multiple dental extractions with alveoloplasty and preprosthetic surgery as needed in the operating room with general anesthesia.     SUBJECTIVE: The patient denies any acute medical or dental changes and agrees to proceed with treatment as planned.  EXAM: No sign of acute dental changes.  ASSESSMENT: Patient is affected by multiple retained root segments, dental caries, chronic periodontitis, phonic apical periodontitis, and bilateral mandibular lingual tori.  PLAN: Patient agrees to proceed with treatment as planned in the operating room as previously discussed and accepts the risks, benefits, and complications of the proposed treatment. Patient is aware of the risk for bleeding, bruising, swelling, infection, pain, nerve damage, soft tissue damage, sinus involvement, root tip fracture, mandible fracture, and the risks of complications associated with the anesthesia. Patient also is aware of the potential for other complications up to and including death due to his overall cardiovascular and respiratory compromise.      Adron Bene, DDS

## 2020-04-03 NOTE — Progress Notes (Signed)
Pt mentation WNL, sensation to lower extremities WNL, denies SOB or tremor.

## 2020-04-03 NOTE — Anesthesia Postprocedure Evaluation (Signed)
Anesthesia Post Note  Patient: Scott Rios  Procedure(s) Performed: Extraction of tooth #'s 11,22,23,26,27,28,29 with alveoloplasty and bilateral mandibular tori reductions (N/A )     Patient location during evaluation: PACU Anesthesia Type: General Level of consciousness: awake and alert Pain management: pain level controlled Vital Signs Assessment: post-procedure vital signs reviewed and stable Respiratory status: spontaneous breathing, nonlabored ventilation, respiratory function stable and patient connected to nasal cannula oxygen Cardiovascular status: blood pressure returned to baseline and stable Postop Assessment: no apparent nausea or vomiting Anesthetic complications: no   No complications documented.  Last Vitals:  Vitals:   04/03/20 0930 04/03/20 1057  BP: 134/61   Pulse: 78   Resp: 16   Temp: 36.5 C (!) 36.3 C  SpO2: 90%     Last Pain:  Vitals:   04/03/20 1057  TempSrc: Axillary  PainSc:                  Kennieth Rad

## 2020-04-03 NOTE — Significant Event (Signed)
Rapid Response Event Note   Reason for Call :  Altered mental status  Initial Focused Assessment:  Received a call from the RN stating that the patient is acting differently than he was and wanted a second set of eyes on the patient.  He just returned from surgery having all of his teeth extracted so he can have heart surgery.  Patient was alert when I walked into the room but he appeared to be in pain.  Patient was crying and anxious.  He had a tremor in his right arm that was new.  I was able to get his tremor to stop by applying manual pressure.  He has full sensation in his extremities.    BP 134/73 (89) Pulse 79 Resp 17 O2 97 [place]    Interventions:  Dr was paged and came to the bedside, vital sign taken, emotional support given to patient, patients mental status checked Patient was asking if he could get out of bed when I was leaving.   Plan of Care:  Told the RN to let me know if she needs anything further.  MD put in a CT order for his head.  RN made aware to let RRT and MD know if patient becomes incontinent of bowel and/or bladder   Event Summary:   MD Notified: 1020 Call Time: 1025 Arrival Time: 1030 End Time: 1037  Andrey Spearman, RN

## 2020-04-03 NOTE — Progress Notes (Addendum)
Upon arrival from surgical removal of all pts teeth, pt confused, and anxious, stating that he could not feel or move legs, developed tremor in right arm, upon visualizing his arm pt acted frightened, began stating unable to breath, placed 02 BNC at 2 liters, vital signs stable, rapid response notified, pt assessed,  MD notified, MD assessed pt in room, order for CT head received, see flow sheet for neuro assessment.

## 2020-04-03 NOTE — Progress Notes (Signed)
Initial Nutrition Assessment  DOCUMENTATION CODES:   Not applicable  INTERVENTION:   -Boost Breeze po TID, each supplement provides 250 kcal and 9 grams of protein -MVI with minerals daily -Once diet is advanced: Glucerna Shake po TID, each supplement provides 220 kcal and 10 grams of protein -RD will follow for diet advancement and adjust supplement regimen as appropriate  NUTRITION DIAGNOSIS:   Increased nutrient needs related to post-op healing as evidenced by estimated needs.  GOAL:   Patient will meet greater than or equal to 90% of their needs  MONITOR:   PO intake, Supplement acceptance, Diet advancement, Labs, Weight trends, Skin, I & O's  REASON FOR ASSESSMENT:   Consult Assessment of nutrition requirement/status  ASSESSMENT:   Scott Rios is a 64 y.o. male with medical history significant for hypertension and diabetes not on home medication, tobacco abuse and prior history of alcohol abuse (quit 10 years ago) who presents to the emergency department due to 39-month history of right leg weakness.  Pt admitted with rt leg weakness (rule out structural lesion in brain and lumbar spine, thiamine deficiency, CIDP) and acute respiratory failure secondary to pulmonary edema.   8/17- s/p TEE- revealed severe AI 8/20- s/p OPERATIONS: Multiple extraction of tooth numbers 11, 22, 23, 26, 27, 28, and 29; Three Quadrants of alveoloplasty; Bilateral mandibular lingual tori reductions  Per CVTS notes, plan to CABG next week.   Attempted to speak with pt, however, receiving nursing care at time of visit. Also attempted to speak with pt via phone call to hospital room, however, no answer.   No recent wt hx documented, so unsure of acute weight loss.   Pt currently on clear liquid diet; will monitor for diet advancement. Previous meal completions documented as 50-75%. Pt would greatly benefit from addition of oral nutrition supplements to assist with post-operative healing.    RD provided diet education on carb modified diet on 04/01/20.   Medications reviewed and include senokot, miralax, and lactated ringers infusion @ 10 ml/hr.   Lab Results  Component Value Date   HGBA1C 10.2 (H) 03/28/2020   PTA DM medications are none.   Labs reviewed: Na: 127, CBGS: 163-186 (inpatient orders for glycemic control are 0-9 units insulin aspart TID with meals and 14 units insulin glargine daily).   Diet Order:   Diet Order            Diet clear liquid Room service appropriate? No; Fluid consistency: Thin  Diet effective now                 EDUCATION NEEDS:   Education needs have been addressed  Skin:  Skin Assessment: Reviewed RN Assessment  Last BM:  04/02/20  Height:   Ht Readings from Last 1 Encounters:  03/31/20 5\' 7"  (1.702 m)    Weight:   Wt Readings from Last 1 Encounters:  04/03/20 65.1 kg    Ideal Body Weight:  67.3 kg  BMI:  Body mass index is 22.49 kg/m.  Estimated Nutritional Needs:   Kcal:  1950-2150  Protein:  95-110 grams  Fluid:  2 L    04/05/20, RD, LDN, CDCES Registered Dietitian II Certified Diabetes Care and Education Specialist Please refer to [facility] for RD and/or RD on-call/weekend/after hours pager

## 2020-04-03 NOTE — Op Note (Signed)
OPERATIVE REPORT  Patient:            Scott Rios Date of Birth:  1956-03-07 MRN:                [id number]   DATE OF PROCEDURE:  04/03/2020  PREOPERATIVE DIAGNOSES: 1.  Severe aortic insufficiency 2.  Moderate to severe mitral regurgitation 3.  Preheart valve surgery dental protocol 4.  Chronic apical periodontitis 5.  Multiple retained root segments 6.  Dental caries 7.  Bilateral mandibular lingual tori  POSTOPERATIVE DIAGNOSES: 1.  Severe aortic insufficiency 2.  Moderate to severe mitral regurgitation 3.  Preheart valve surgery dental protocol 4.  Chronic apical periodontitis 5.  Multiple retained root segments 6.  Dental caries 7.  Bilateral mandibular lingual tori  OPERATIONS: 1. Multiple extraction of tooth numbers 11, 22, 23, 26, 27, 28, and 29 2. Three Quadrants of alveoloplasty 3.  Bilateral mandibular lingual tori reductions   SURGEON: Charlynne Pander, DDS  ASSISTANT: Pearletha Alfred (dental assistant)  ANESTHESIA: General anesthesia via nasoendotracheal tube.  MEDICATIONS: 1.  Clindamycin 600 mg IV prior to invasive dental procedures. 2. Local anesthesia with a total utilization of 4 carpules each containing 34 mg of lidocaine with 0.017 mg of epinephrine as well as 2 carpules each containing 9 mg of bupivacaine with 0.009 mg of epinephrine.  SPECIMENS: There are 7 teeth that were discarded.  DRAINS: None  CULTURES: None  COMPLICATIONS: None  ESTIMATED BLOOD LOSS: Less than 50 mLs.  INTRAVENOUS FLUIDS: 1000 mLs of Lactated ringers solution.  INDICATIONS: The patient was recently diagnosed with severe aortic insufficiency, moderate to severe mitral regurgitation, and three-vessel coronary artery disease.  A medically necessary dental consultation was then requested to evaluate poor dentition.  The patient was examined and treatment planned for extraction of remaining root segments with alveoloplasty and bilateral mandibular lingual tori  reductions.  This treatment plan was formulated to decrease the risks and complications associated with dental infection from affecting the patient's systemic health and the anticipated heart valve surgery.  OPERATIVE FINDINGS: Patient was examined operating room number 8.  The teeth were identified for extraction. The patient was noted be affected by multiple retained root segments, dental caries, chronic apical periodontitis, chronic periodontitis, and bilateral mandibular lingual tori.   DESCRIPTION OF PROCEDURE: Patient was brought to the main operating room number 8. Patient was then placed in the supine position on the operating table. General anesthesia was then induced per the anesthesia team. The patient was then prepped and draped in the usual manner for dental medicine procedure. A timeout was performed. The patient was identified and procedures were verified. A throat pack was placed at this time. The oral cavity was then thoroughly examined with the findings noted above. The patient was then ready for dental medicine procedure as follows:  Local anesthesia was then administered sequentially with a total utilization of 4 carpules each containing 34 mg of lidocaine with 0.017 mg of epinephrine as well as 2 carpules  each containing 9 mg bupivacaine with 0.009 mg of epinephrine.  The Maxillary left quadrant was first approached. Anesthesia was then delivered utilizing infiltration with lidocaine with epinephrine. A #15 blade incision was then made from the distal #13 and extended to the mesial of #9.  A  surgical flap was then carefully reflected.  Retained root segment #11 was removed with a 150 forceps without complications.  Alveoloplasty was then performed utilizing a ronguers and bone file to help achieve primary closure  and to remove excessive bone on the buccal aspect. The surgical site was then irrigated with copious amounts of sterile saline. The tissues were approximated and trimmed  appropriately. The surgical site was then closed from the distal #13 extended to the mesial #9 utilizing 3-0 Chromic Gut suture in a continuous interrupted suture technique x1.  At this point time, the mandibular quadrants were approached. The patient was given bilateral inferior alveolar nerve blocks and long buccal nerve blocks utilizing the bupivacaine with epinephrine. Further infiltration was then achieved utilizing the lidocaine with epinephrine. A 15 blade incision was then made from the distal of number 20 and extended to the distal of #31.  A surgical flap was then carefully reflected.  Tooth numbers 22, 23, 26, 27, 28, 29 were then removed with a 151 forceps without complications.  Alveoloplasty was then performed utilizing rongeurs and bone file to help achieve primary closure.  The flaps were then further reflected on the lingual aspect to expose the bilateral mandibular lingual tori.  These were then reduced utilizing a surgical handpiece and bur and copious amounts sterile water.  Further alveoloplasty was then performed utilizing a ronguers and bone file.  The surgical site was irrigated with copious amounts sterile saline x4.  The tissues were approximated and trimmed appropriately.  The mandibular left surgical site was then closed from the distal of #20 and extended to the mesial #24 utilizing 3-0 Chromic Gut suture in a continuous interrupted suture technique x1.  The mandibular right quadrant was then closed from the distal of #31 extended to the mesial #25 utilizing 3-0 Chromic Gut suture in a continuous erupted suture technique x1.    At this point time, the entire mouth was irrigated with copious amounts of sterile saline. The patient was examined for complications, seeing none, the dental medicine procedure was deemed to be complete. The throat pack was removed at this time. An oral airway was then placed at the request of the anesthesia team. A series of 4 x 4 gauze moistened with Amicar  5% rinse were placed in the mouth to aid hemostasis. The patient was then handed over to the anesthesia team for final disposition. After an appropriate amount of time, the patient was extubated and taken to the postanesthsia care unit in good condition. All counts were correct for the dental medicine procedure.  The patient is to continue Amicar 5% rinses postoperatively.  Patient is to rinse with 10 mLs every hour for the next 10 hours in a swish and spit manner.  Subcutaneous heparin therapy may be restarted at the discretion of the medical team at 2100 this evening.   Charlynne Pander, DDS.

## 2020-04-03 NOTE — Progress Notes (Signed)
PROGRESS NOTE    Scott Rios  PYP:[id number] DOB: [birth year] DOA: 03/27/2020 PCP: Elfredia Nevins, MD   Brief Narrative:  HPI on 03/28/2020 by Dr. Frankey Shown Scott Rios is a 64 y.o. male with medical history significant for hypertension and diabetes not on home medication, tobacco abuse and prior history of alcohol abuse (quit 10 years ago) who presents to the emergency department due to 65-month history of right leg weakness.  Patient states that the right leg sometimes go out on him.  He complained of difficulty in being able to stand after using the toilet last evening and he ended up crawling into the living room.  Right leg was numb and weak and with more difficulty in being able to raise the leg.  He complained of chronic constipation with last bowel movement pain 8 days ago, however there was no complaint of back pain, bowel or bladder changes.  Right hand was only significant for occasional tingling sensation.  He denies fever, chills, chest pain, shortness of breath or having any sick contact.  Interim history Patient was admitted with shortness of breath which improved with Lasix.  Also complained of leg weakness which is also improving but slowly.  Cardiology consulted and recommended CTA of the chest to rule out dissection and patient also underwent cardiac catheterization as well as TEE.  Patient was noted to have severe multivessel disease and significant aortic regurgitation require CABG as well as aortic valve replacement and possible mitral valve repair.  Cardiothoracic surgery consulted.  Dentistry also consulted, s/p multiple tooth extractions.  Assessment & Plan   Right leg weakness/back pain -Rule out structural lesion in the brain/lumbar spine/thiamine deficiency, CIDP -Appears to be improving -CT head showed no acute abnormality -CT lumbar spine without contrast showed no acute abnormality but did show multifactorial degenerative changes at L4-5 with resulting  severe spinal stenosis -Patient was placed on high-dose thiamine -TSH 1.92, repeated 2.465.   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  No acute osseous abnormality.  Chronic L4 compression fracture deformity with mild height loss.  Moderate to severe spinal canal narrowing at L4-5 level.  Moderate right L2-3, right L4-5 neural foraminal narrowing.  L3-4 disc bulge abutment of the existing L3, descending L4 nerve roots.  L5-S1 disc bulge abutment of the extending L5 and descending S1 nerve roots. 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  Mild ectasia of the ascending aorta measuring 3.7 cm in AP diameter.  Moderate bilateral pleural effusions with bibasilar atelectasis.  Patchy groundglass opacification of the mid to upper lungs may be due to infection inflammatory process.  1.1 cm right paratracheal lymph node likely reactive. -Placed on IV Lasix -Echocardiogram showed an EF of 35 to 40%, grade 2 diastolic dysfunction along with significant AR and MR -Cardiology recommended right and left heart cath showing severe multivessel disease and severe aortic regurgitation and moderate to severe mitral regurgitation -TEE showed an EF of 35 to 40%.  No thrombus or mass noted.  Severe AI, moderate MR that is eccentric appears partially due to  annular dilation as appears to have a cleft/perforation adjacent to P2. -Continue losartan,  spironolactone, metoprolol  NSTEMI -Likely secondary to the above, troponin peaked at 1747 and trended downward to 629 -No complaints of chest pain although patient did have shortness of breath -Patient with severe CAD as noted above along with uncontrolled diabetes mellitus, type II -Cardiothoracic surgery consulted and appreciated-feels patient may benefit from CABG/AVR and mitral valve repair -Dentistry also consulted  Elevated D-dimer -Work-up as noted above -Lower extremity Dopplers negative for DVT  Severe aortic regurgitation/mitral regurgitation -Noted on TEE -Patient will benefit from replacement, cardiothoracic surgery already consulted  Dental caries -As above, dentistry consulted -Pending surgery on 04/03/2020  Diabetes mellitus, type II, uncontrolled with hyperglycemia -Hemoglobin A1c 10.2 -Please coordinator consulted and appreciated -Continue insulin sliding scale and CBG monitoring  Hypomagnesemia -Resolved with replacement, continue to monitor and supplement as needed  Hypoalbuminemia -Nutrition consulted and appreciated  Incidental pulmonary nodule/paratracheal lymph node -Noted on imaging noted above -Patient will need to follow-up outpatient  Tobacco abuse  -Smoking cessation -Continue nicotine patch  Hyponatremia -Will place on IVF and monitor BMP  Leukocytosis -Suspect reactive, has resolved  Normocytic anemia -Stable hemoglobin currently 12.6, continue to monitor CBC  Acute anxiety -Patient returned from dental extraction earlier today and felt that he could not feel his legs or move them and felt that his right upper extremity was shaking. -At bedside, patient appeared to be very anxious and was able to be calmed down.  Was able to move all 4 extremities. -Suspect lingering effects of anesthesia -Will obtain CT head -Currently patient is not having any type of incontinence, however we will continue to monitor-if this develops, will obtain  MRI  DVT Prophylaxis  heparin  Code Status: Full  Family Communication:  None at bedside  Disposition Plan:  Status is: Inpatient  Remains inpatient appropriate because:Ongoing diagnostic testing needed not appropriate for outpatient work up and Inpatient level of care appropriate due to severity of illness   Dispo: The patient is from: Home              Anticipated d/c is to: Home w/ [facility]              Anticipated d/c date is: > 3 days              Patient currently is not medically stable to d/c.  Consultants Cardiology Neurology Cardiothoracic surgery Dentistry  Procedures  Echocardiogram TEE Right and left heart catheterization Lower extremity Doppler Multiple extraction of tooth numbers 11, 22, 23, 26, 27, 28, and 29; Three Quadrants of alveoloplasty; Bilateral mandibular lingual tori reductions  Antibiotics   Anti-infectives (From admission, onward)   Start     Dose/Rate Route Frequency Ordered Stop   04/02/20 0400  vancomycin (VANCOREADY) IVPB 1250 mg/250 mL  Status:  Discontinued        1,250 mg 166.7 mL/hr over 90 Minutes Intravenous To Surgery 04/01/20 1045 04/01/20 1142   04/02/20 0400  levofloxacin (LEVAQUIN) IVPB 500 mg  Status:  Discontinued        500 mg 100 mL/hr over 60 Minutes Intravenous To Surgery 04/01/20 1045 04/01/20 1142   04/02/20 0000  clindamycin (CLEOCIN) IVPB 600 mg        600 mg 100 mL/hr over 30 Minutes Intravenous  Once 04/01/20 1850 04/03/20 0815      Subjective:   Scott Rios seen and examined today.    Patient returned from surgery extremely anxious.  Was crying  and stated he could not feel his lower extremities.  Also felt that his right arm was shaking.  Patient was able to be calmed down.  He was able to move his lower extremities.   Objective:   Vitals:   04/03/20 0907 04/03/20 0915 04/03/20 0930 04/03/20 1057  BP:  126/60 134/61   Pulse: 75 78 78   Resp: (!) 21 20 16    Temp:   97.7 F (36.5 C) (!) 97.3 F (36.3 C)    TempSrc:    Axillary  SpO2: 99% 91% 90%   Weight:      Height:        Intake/Output Summary (Last 24 hours) at 04/03/2020 1244 Last data filed at 04/03/2020 0838 Gross per 24 hour  Intake 1391 ml  Output 2575 ml  Net -1184 ml   Filed Weights   04/01/20 0555 04/02/20 0544 04/03/20 0521  Weight: 67.5 kg 65.6 kg 65.1 kg   Exam  General: Well developed, chronically ill-appearing, NAD  HEENT: NCAT, mucous membranes moist. Poor dentition-packing noted  Cardiovascular: S1 S2 auscultated, RRR.  Respiratory: Diminished breath sounds  Abdomen: Soft, nontender, nondistended, + bowel sounds  Extremities: warm dry without cyanosis clubbing.  LLE edema.  Neuro: AAOx3, nonfocal, patient shaking his right upper extremity  Psych: extremely anxious, and tearful, however able to be calmed down and redirected   Data Reviewed: I have personally reviewed following labs and imaging studies  CBC: Recent Labs  Lab 03/27/20 2317 03/28/20 0324 03/29/20 0941 03/29/20 0941 03/30/20 0309 03/30/20 1459 03/30/20 1500 03/31/20 0341 04/01/20 0647 04/02/20 0430 04/03/20 0435  WBC 10.6*   < > 13.7*   < > 12.0*  --   --  11.1* 10.6* 11.0* 10.8*  NEUTROABS 7.5  --  10.7*  --  8.3*  --   --  7.3 7.2  --   --   HGB 12.9*   < > 12.9*   < > 12.7*   < > 12.6* 12.8* 12.2* 12.6* 12.7*  HCT 36.8*   < > 36.9*   < > 36.5*   < > 37.0* 37.3* 36.9* 37.8* 36.9*  MCV 85.6   < > 84.6   < > 85.9  --   --  86.5 86.4 86.9 86.6  PLT 319   < > 342   < > 356  --   --  355 331 344 351   < > = values in this interval not displayed.   Basic Metabolic Panel: Recent Labs  Lab 03/27/20 2317 03/28/20 0324 03/29/20 0941 03/29/20 0941 03/30/20 0309 03/30/20 1459 03/30/20 1500 03/31/20 0341 04/01/20 0647 04/02/20 0430 04/03/20 0435  NA 126*   < > 130*   < > 128*   < > 132* 132* 130* 131* 127*  K 3.6   < > 4.2   < > 4.4   < > 4.1 4.5 4.0 4.4 4.3  CL 93*   < > 96*   < > 94*  --   --  93* 93* 91* 89*  CO2 22   <  > 26   < > 25  --   --  29 30 31 30   GLUCOSE 344*   < > 221*   < > 264*  --   --  88 170* 154* 206*  BUN 8   < > 7*   < > 10  --   --  15 15 15 15   CREATININE 0.64   < > 0.57*   < >  0.78  --   --  0.74 0.74 0.74 0.70  CALCIUM 8.5*   < > 8.9   < > 8.6*  --   --  9.3 8.7* 9.3 8.9  MG 1.4*  --  1.6*  --  2.0  --   --  1.8 1.8  --   --   PHOS  --   --  3.6  --  5.4*  --   --  5.6* 4.2  --   --    < > = values in this interval not displayed.   GFR: Estimated Creatinine Clearance: 84.8 mL/min (by C-G formula based on SCr of 0.7 mg/dL). Liver Function Tests: Recent Labs  Lab 03/28/20 0324 03/29/20 0941 03/30/20 0309 03/31/20 0341 04/01/20 0647  AST 12* 14* 16 18 14*  ALT ALKPHOS 62 57 74 69 73  BILITOT 0.4 0.7 0.3 0.7 0.3  PROT 6.0* 5.5* 5.6* 5.9* 5.4*  ALBUMIN 3.1* 2.9* 2.9* 3.1* 2.8*   Recent Labs  Lab 03/27/20 2317  LIPASE 17   No results for input(s): AMMONIA in the last 168 hours. Coagulation Profile: Recent Labs  Lab 03/28/20 0324  INR 1.1   Cardiac Enzymes: No results for input(s): CKTOTAL, CKMB, CKMBINDEX, TROPONINI in the last 168 hours. BNP (last 3 results) No results for input(s): PROBNP in the last 8760 hours. HbA1C: No results for input(s): HGBA1C in the last 72 hours. CBG: Recent Labs  Lab 04/02/20 1723 04/02/20 2128 04/03/20 0718 04/03/20 0907 04/03/20 1052  GLUCAP 296* 190* 163* 186* 223*   Lipid Profile: No results for input(s): CHOL, HDL, LDLCALC, TRIG, CHOLHDL, LDLDIRECT in the last 72 hours. Thyroid Function Tests: No results for input(s): TSH, T4TOTAL, FREET4, T3FREE, THYROIDAB in the last 72 hours. Anemia Panel: No results for input(s): VITAMINB12, FOLATE, FERRITIN, TIBC, IRON, RETICCTPCT in the last 72 hours. Urine analysis:    Component Value Date/Time   COLORURINE YELLOW 09/29/2012 1710   APPEARANCEUR CLEAR 09/29/2012 1710   LABSPEC >1.030 (H) 09/29/2012 1710   PHURINE 5.5 09/29/2012 1710   GLUCOSEU >1000 (A)  09/29/2012 1710   HGBUR TRACE (A) 09/29/2012 1710   BILIRUBINUR NEGATIVE 09/29/2012 1710   KETONESUR 15 (A) 09/29/2012 1710   PROTEINUR NEGATIVE 09/29/2012 1710   UROBILINOGEN 0.2 09/29/2012 1710   NITRITE NEGATIVE 09/29/2012 1710   LEUKOCYTESUR NEGATIVE 09/29/2012 1710   Sepsis Labs: (procalcitonin:4,lacticidven:4)  ) Recent Results (from the past 240 hour(s))  SARS Coronavirus 2 by RT PCR (hospital order, performed in [facility] hospital lab) Nasopharyngeal Nasopharyngeal Swab     Status: None   Collection Time: 03/28/20  2:20 AM   Specimen: Nasopharyngeal Swab  Result Value Ref Range Status   SARS Coronavirus 2 NEGATIVE NEGATIVE Final    Comment: (NOTE) SARS-CoV-2 target nucleic acids are NOT DETECTED.  The SARS-CoV-2 RNA is generally detectable in upper and lower respiratory specimens during the acute phase of infection. The lowest concentration of SARS-CoV-2 viral copies this assay can detect is 250 copies / mL. A negative result does not preclude SARS-CoV-2 infection and should not be used as the sole basis for treatment or other patient management decisions.  A negative result may occur with improper specimen collection / handling, submission of specimen other than nasopharyngeal swab, presence of viral mutation(s) within the areas targeted by this assay, and inadequate number of viral copies (<250 copies / mL). A negative result must be combined with clinical observations, patient history, and epidemiological information.  Fact Sheet for Patients:   BoilerBrush.com.cyhttps://www.fda.gov/media/136312/download  Fact Sheet for Healthcare Providers: https://pope.com/https://www.fda.gov/media/136313/download  This test is not yet approved or  cleared by the [place] FDA and has been authorized for detection and/or diagnosis of SARS-CoV-2 by FDA under an Emergency Use Authorization (EUA).  This EUA will remain in effect (meaning this test can be used) for the duration of the COVID-19  declaration under Section 564(b)(1) of the Act, 21 U.S.C. section 360bbb-3(b)(1), unless the authorization is terminated or revoked sooner.  Performed at [facility], [address] [place], [place] [address]       Radiology Studies: No results found.   Scheduled Meds: . aminocaproic acid  10 mL Oral Q1H  . aspirin EC  81 mg Oral Daily  . atorvastatin  80 mg Oral Daily  . Chlorhexidine Gluconate Cloth  6 each Topical Daily  . cyanocobalamin  1,000 mcg Intramuscular Daily  . feeding supplement (GLUCERNA SHAKE)  237 mL Oral TID BM  . furosemide  40 mg Intravenous BID  . heparin injection (subcutaneous)  5,000 Units Subcutaneous Q8H  . insulin aspart  0-5 Units Subcutaneous QHS  . insulin aspart  0-9 Units Subcutaneous TID WC  . insulin glargine  14 Units Subcutaneous Daily  . losartan  25 mg Oral Daily  . metoprolol tartrate  25 mg Oral BID  . multivitamin with minerals  1 tablet Oral Daily  . nicotine  21 mg Transdermal Daily  . polyethylene glycol  17 g Oral BID  . senna-docusate  1 tablet Oral BID  . sodium chloride flush  3 mL Intravenous Q12H  . spironolactone  12.5 mg Oral Daily   Continuous Infusions: . sodium chloride 10 mL/hr at 04/02/20 2340  . lactated ringers 10 mL/hr at 04/03/20 1201     LOS: 6 days   Time Spent in minutes   45 minutes  Jemila Camille D.O. on 04/03/2020 at 12:44 PM  Between 7am to 7pm - Please see pager noted on [email or web address]  After 7pm go to [email or web address]  And look for the night coverage person covering for me after hours  [facility] Office  [phone]

## 2020-04-03 NOTE — Transfer of Care (Signed)
Immediate Anesthesia Transfer of Care Note  Patient: Scott Rios  Procedure(s) Performed: Extraction of tooth #'s 11,22,23,26,27,28,29 with alveoloplasty and bilateral mandibular tori reductions (N/A )  Patient Location: PACU  Anesthesia Type:General  Level of Consciousness: awake, alert  and oriented  Airway & Oxygen Therapy: Patient Spontanous Breathing and Patient connected to face mask oxygen  Post-op Assessment: Report given to RN and Post -op Vital signs reviewed and stable  Post vital signs: Reviewed and stable  Last Vitals:  Vitals Value Taken Time  BP 137/57 04/03/20 0859  Temp    Pulse 75 04/03/20 0859  Resp 17 04/03/20 0859  SpO2 99 % 04/03/20 0859    Last Pain:  Vitals:   04/03/20 0521  TempSrc: Oral  PainSc:       Patients Stated Pain Goal: 0 (03/30/20 0820)  Complications: No complications documented.

## 2020-04-03 NOTE — Anesthesia Procedure Notes (Signed)
Procedure Name: Intubation Date/Time: 04/03/2020 7:34 AM Performed by: Pearson Grippe, CRNA Pre-anesthesia Checklist: Patient identified, Emergency Drugs available, Suction available and Patient being monitored Patient Re-evaluated:Patient Re-evaluated prior to induction Oxygen Delivery Method: Circle system utilized Preoxygenation: Pre-oxygenation with 100% oxygen Induction Type: IV induction Ventilation: Mask ventilation without difficulty Laryngoscope Size: Miller and 2 Grade View: Grade I Nasal Tubes: Nasal prep performed, Nasal Rae, Right and Magill forceps- large, utilized Tube size: 7.5 mm Number of attempts: 1 Placement Confirmation: ETT inserted through vocal cords under direct vision,  positive ETCO2 and breath sounds checked- equal and bilateral Secured at: 27 cm Tube secured with: Tape Dental Injury: Teeth and Oropharynx as per pre-operative assessment

## 2020-04-03 NOTE — Progress Notes (Signed)
   Dental procedure today Dr. Laneta Simmers note reviewed.   Will follow.   Donato Schultz, MD

## 2020-04-04 ENCOUNTER — Encounter (HOSPITAL_COMMUNITY): Payer: Self-pay | Admitting: Dentistry

## 2020-04-04 DIAGNOSIS — I214 Non-ST elevation (NSTEMI) myocardial infarction: Secondary | ICD-10-CM

## 2020-04-04 LAB — CBC
HCT: 35.6 % — ABNORMAL LOW (ref 39.0–52.0)
Hemoglobin: 11.9 g/dL — ABNORMAL LOW (ref 13.0–17.0)
MCH: 28.9 pg (ref 26.0–34.0)
MCHC: 33.4 g/dL (ref 30.0–36.0)
MCV: 86.4 fL (ref 80.0–100.0)
Platelets: 312 10*3/uL (ref 150–400)
RBC: 4.12 MIL/uL — ABNORMAL LOW (ref 4.22–5.81)
RDW: 13.2 % (ref 11.5–15.5)
WBC: 13.6 10*3/uL — ABNORMAL HIGH (ref 4.0–10.5)
nRBC: 0 % (ref 0.0–0.2)

## 2020-04-04 LAB — BASIC METABOLIC PANEL
Anion gap: 10 (ref 5–15)
BUN: 13 mg/dL (ref 8–23)
CO2: 27 mmol/L (ref 22–32)
Calcium: 9 mg/dL (ref 8.9–10.3)
Chloride: 89 mmol/L — ABNORMAL LOW (ref 98–111)
Creatinine, Ser: 0.75 mg/dL (ref 0.61–1.24)
GFR calc Af Amer: >60 mL/min (ref >60)
GFR calc non Af Amer: >60 mL/min (ref >60)
Glucose, Bld: 172 mg/dL — ABNORMAL HIGH (ref 70–99)
Potassium: 3.9 mmol/L (ref 3.5–5.1)
Sodium: 126 mmol/L — ABNORMAL LOW (ref 135–145)

## 2020-04-04 LAB — GLUCOSE, CAPILLARY
Glucose-Capillary: 112 mg/dL — ABNORMAL HIGH (ref 70–99)
Glucose-Capillary: 179 mg/dL — ABNORMAL HIGH (ref 70–99)
Glucose-Capillary: 198 mg/dL — ABNORMAL HIGH (ref 70–99)
Glucose-Capillary: 209 mg/dL — ABNORMAL HIGH (ref 70–99)
Glucose-Capillary: 236 mg/dL — ABNORMAL HIGH (ref 70–99)
Glucose-Capillary: 258 mg/dL — ABNORMAL HIGH (ref 70–99)
Glucose-Capillary: 283 mg/dL — ABNORMAL HIGH (ref 70–99)

## 2020-04-04 MED ORDER — FUROSEMIDE 40 MG PO TABS
40.0000 mg | ORAL_TABLET | Freq: Every day | ORAL | Status: DC
  Administered 2020-04-05 – 2020-04-07 (×3): 40 mg via ORAL
  Filled 2020-04-04 (×3): qty 1

## 2020-04-04 NOTE — Progress Notes (Signed)
Progress Note  Patient Name: Scott Rios Date of Encounter: 04/04/2020  [facility] Cardiologist: No primary care provider on file.   Subjective   Underwent dental extractions yesterday.  Had some mental status changes afterwards, head CT was done which was negative.  He is currently alert and oriented x3.  Denies any chest pain or dyspnea..  Inpatient Medications    Scheduled Meds: . aspirin EC  81 mg Oral Daily  . atorvastatin  80 mg Oral Daily  . feeding supplement (GLUCERNA SHAKE)  237 mL Oral TID BM  . furosemide  40 mg Intravenous BID  . heparin injection (subcutaneous)  5,000 Units Subcutaneous Q8H  . insulin aspart  0-5 Units Subcutaneous QHS  . insulin aspart  0-9 Units Subcutaneous TID WC  . insulin glargine  14 Units Subcutaneous Daily  . losartan  25 mg Oral Daily  . metoprolol tartrate  25 mg Oral BID  . multivitamin with minerals  1 tablet Oral Daily  . nicotine  21 mg Transdermal Daily  . polyethylene glycol  17 g Oral BID  . senna-docusate  1 tablet Oral BID  . sodium chloride flush  3 mL Intravenous Q12H  . spironolactone  12.5 mg Oral Daily   Continuous Infusions: . sodium chloride 10 mL/hr at 04/02/20 2340  . lactated ringers 10 mL/hr at 04/03/20 1201   PRN Meds: sodium chloride, acetaminophen, diphenhydrAMINE, HYDROcodone-acetaminophen, ondansetron (ZOFRAN) IV, sodium chloride flush   Vital Signs    Vitals:   04/04/20 0033 04/04/20 0422 04/04/20 0723 04/04/20 0807  BP: (!) 117/57 106/60 (!) 103/50 131/80  Pulse: 66 65 65 65  Resp: 18 15  18   Temp: 97.6 F (36.4 C) 97.6 F (36.4 C) 97.9 F (36.6 C)   TempSrc: Oral Oral Oral   SpO2: 95% 98% 99% 98%  Weight:  66.7 kg    Height:        Intake/Output Summary (Last 24 hours) at 04/04/2020 1102 Last data filed at 04/04/2020 04/06/2020 Gross per 24 hour  Intake 1817.05 ml  Output 3175 ml  Net -1357.95 ml   Last 3 Weights 04/04/2020 04/03/2020 04/02/2020  Weight (lbs) 147 lb 1.6 oz 143 lb 9.4 oz  144 lb 9.6 oz  Weight (kg) 66.724 kg 65.13 kg 65.59 kg      Telemetry    No adverse arrhythmias- Personally Reviewed  Physical Exam   GEN: Well nourished, well developed, in no acute distress  HEENT: normal  Neck: no JVD, carotid bruits, or masses Cardiac: RRR; no murmurs, rubs, or gallops,no edema  Respiratory:  clear to auscultation bilaterally, normal work of breathing GI: soft, nontender, nondistended, + BS MS: no deformity or atrophy  Skin: warm and dry, no rash Neuro:  Alert and Oriented x 3, Strength and sensation are intact Psych: euthymic mood, full affect    Labs    High Sensitivity Troponin:   Recent Labs  Lab 03/27/20 2317 03/28/20 0324 03/28/20 0819 03/28/20 1006 03/29/20 0941  TROPONINIHS 27* 121* 1,479* 1,747* 629*      Chemistry Recent Labs  Lab 03/30/20 0309 03/30/20 1459 03/31/20 0341 03/31/20 0341 04/01/20 0647 04/01/20 0647 04/02/20 0430 04/03/20 0435 04/04/20 0210  NA 128*   < > 132*   < > 130*   < > 131* 127* 126*  K 4.4   < > 4.5   < > 4.0   < > 4.4 4.3 3.9  CL 94*  --  93*   < > 93*   < >  91* 89* 89*  CO2 25  --  29   < > 30   < > 31 30 27   GLUCOSE 264*  --  88   < > 170*   < > 154* 206* 172*  BUN 10  --  15   < > 15   < > 15 15 13   CREATININE 0.78  --  0.74   < > 0.74   < > 0.74 0.70 0.75  CALCIUM 8.6*  --  9.3   < > 8.7*   < > 9.3 8.9 9.0  PROT 5.6*  --  5.9*  --  5.4*  --   --   --   --   ALBUMIN 2.9*  --  3.1*  --  2.8*  --   --   --   --   AST 16  --  18  --  14*  --   --   --   --   ALT 13  --  16  --  15  --   --   --   --   ALKPHOS 74  --  69  --  73  --   --   --   --   BILITOT 0.3  --  0.7  --  0.3  --   --   --   --   GFRNONAA >60  --  >60   < > >60   < > >60 >60 >60  GFRAA >60  --  >60   < > >60   < > >60 >60 >60  ANIONGAP 9  --  10   < > 7   < > 9 8 10    < > = values in this interval not displayed.     Hematology Recent Labs  Lab 04/02/20 0430 04/03/20 0435 04/04/20 0210  WBC 11.0* 10.8* 13.6*  RBC 4.35  4.26 4.12*  HGB 12.6* 12.7* 11.9*  HCT 37.8* 36.9* 35.6*  MCV 86.9 86.6 86.4  MCH 29.0 29.8 28.9  MCHC 33.3 34.4 33.4  RDW 13.3 13.4 13.2  PLT 344 351 312    BNPNo results for input(s): BNP, PROBNP in the last 168 hours.   DDimer  Recent Labs  Lab 03/28/20 2216  DDIMER 0.84*     Radiology    CT HEAD WO CONTRAST  Result Date: 04/03/2020 CLINICAL DATA:  Mental status change, unknown cause. EXAM: CT HEAD WITHOUT CONTRAST TECHNIQUE: Contiguous axial images were obtained from the base of the skull through the vertex without intravenous contrast. COMPARISON:  MRI 03/28/2020 FINDINGS: Brain: Other than minimal chronic small-vessel change of the deep white matter, the brain shows a normal appearance without evidence of malformation, atrophy, old or acute large vessel infarction, mass lesion, hemorrhage, hydrocephalus or extra-axial collection. Vascular: There is atherosclerotic calcification of the major vessels at the base of the brain. Skull: Normal.  No traumatic finding.  No focal bone lesion. Sinuses/Orbits: Sinuses are clear. Orbits appear normal. Mastoids are clear. Other: None significant IMPRESSION: No acute finding by CT. Minimal small-vessel change of the hemispheric deep white matter. Electronically Signed   By: 2217 M.D.   On: 04/03/2020 14:27    Cardiac Studies   Diagnostic Dominance: Right   TEE: Severe aortic insufficiency, moderate to severe mitral regurgitation, EF 35%  Patient Profile     64 y.o. male with severe aortic insufficiency moderate to severe mitral regurgitation EF 35% triple-vessel coronary artery disease awaiting CABG/aortic valve replacement/possible  mitral valve repair  Assessment & Plan    CAD/non-STEMI/severe aortic insufficiency/moderate mitral regurgitation/ischemic cardiomyopathy/acute systolic heart failure/hyponatremia -Planning CABG/AVR/potential mitral repair with Dr Laneta Simmers -Echo with EF 35 to 40% -Continue ASA, statin, metoprolol,  losartan, spironolactone -Hyponatremia continues to worsen, sodium 126.  On his RHC on 8/16, RA pressure was 2 and LVEDP was normal.  He appears euvolemic on exam.  Suspect worsening hyponatremia due to diuresis, would hold IV Lasix and can start p.o. Lasix 40 mg daily tomorrow   For questions or updates, please contact [facility] Please consult [email or web address] for contact info under        Signed, Little Ishikawa, MD  04/04/2020, 11:02 AM

## 2020-04-04 NOTE — Progress Notes (Signed)
AOT Cancellation Note  Patient Details Name: Scott Rios MRN: [id number] DOB: 02/17/1956   Cancelled Treatment:    Reason Eval/Treat Not Completed: Other (comment). Acknowledged discontinued OT order, Pt has been getting up with min assist to min guard.  Noted plan for CABG, please re-order as needed post surgery.   Scott Rios, OT Acute Rehabilitation Services Pager [phone] Office [phone]   Chancy Milroy 04/04/2020, 12:40 PM

## 2020-04-04 NOTE — Progress Notes (Signed)
PROGRESS NOTE    [name]  JTT:[id number] DOB: [birth year] DOA: 03/27/2020 PCP: Elfredia Nevins, MD   Brief Narrative:  HPI on 03/28/2020 by Dr. Frankey Shown Scott Rios is a 64 y.o. male with medical history significant for hypertension and diabetes not on home medication, tobacco abuse and prior history of alcohol abuse (quit 10 years ago) who presents to the emergency department due to 59-month history of right leg weakness.  Patient states that the right leg sometimes go out on him.  He complained of difficulty in being able to stand after using the toilet last evening and he ended up crawling into the living room.  Right leg was numb and weak and with more difficulty in being able to raise the leg.  He complained of chronic constipation with last bowel movement pain 8 days ago, however there was no complaint of back pain, bowel or bladder changes.  Right hand was only significant for occasional tingling sensation.  He denies fever, chills, chest pain, shortness of breath or having any sick contact.  Interim history Patient was admitted with shortness of breath which improved with Lasix.  Also complained of leg weakness which is also improving but slowly.  Cardiology consulted and recommended CTA of the chest to rule out dissection and patient also underwent cardiac catheterization as well as TEE.  Patient was noted to have severe multivessel disease and significant aortic regurgitation require CABG as well as aortic valve replacement and possible mitral valve repair.  Cardiothoracic surgery consulted.  Dentistry also consulted, s/p multiple tooth extractions.  Assessment & Plan   Right leg weakness/back pain -Rule out structural lesion in the brain/lumbar spine/thiamine deficiency, CIDP -Appears to be improving -CT head showed no acute abnormality -CT lumbar spine without contrast showed no acute abnormality but did show multifactorial degenerative changes at L4-5 with resulting  severe spinal stenosis -Patient was placed on high-dose thiamine -TSH 1.92, repeated 2.465.   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  No acute osseous abnormality.  Chronic L4 compression fracture deformity with mild height loss.  Moderate to severe spinal canal narrowing at L4-5 level.  Moderate right L2-3, right L4-5 neural foraminal narrowing.  L3-4 disc bulge abutment of the existing L3, descending L4 nerve roots.  L5-S1 disc bulge abutment of the extending L5 and descending S1 nerve roots. 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  Mild ectasia of the ascending aorta measuring 3.7 cm in AP diameter.  Moderate bilateral pleural effusions with bibasilar atelectasis.  Patchy groundglass opacification of the mid to upper lungs may be due to infection inflammatory process.  1.1 cm right paratracheal lymph node likely reactive. -Echocardiogram showed an EF of 35 to 40%, grade 2 diastolic dysfunction along with significant AR and MR -Cardiology recommended right and left heart cath showing severe multivessel disease and severe aortic regurgitation and moderate to severe mitral regurgitation -TEE showed an EF of 35 to 40%.  No thrombus or mass noted.  Severe AI, moderate MR that is eccentric appears  partially due to annular dilation as appears to have a cleft/perforation adjacent to  P2. -Continue losartan, spironolactone, metoprolol, IV lasix  NSTEMI -Likely secondary to the above, troponin peaked at 1747 and trended downward to 629 -No complaints of chest pain although patient did have shortness of breath -Patient with severe CAD as noted above along with uncontrolled diabetes mellitus, type II -Cardiothoracic surgery consulted and appreciated-feels patient may benefit from CABG/AVR and mitral valve repair -Dentistry also consulted  Elevated D-dimer -Work-up as noted above -Lower extremity Dopplers negative for DVT  Severe aortic regurgitation/mitral regurgitation -Noted on TEE -Patient will benefit from replacement, cardiothoracic surgery already consulted  Dental caries -As above, dentistry consulted -s/p dental extraction on 04/03/2020  Diabetes mellitus, type II, uncontrolled with hyperglycemia -Hemoglobin A1c 10.2 -Please coordinator consulted and appreciated -Continue insulin sliding scale and CBG monitoring  Hypomagnesemia -Resolved with replacement, continue to monitor and supplement as needed  Hypoalbuminemia -Nutrition consulted and appreciated  Incidental pulmonary nodule/paratracheal lymph node -Noted on imaging noted above -Patient will need to follow-up outpatient  Tobacco abuse  -Smoking cessation -Continue nicotine patch  Hyponatremia -Despite being placed on IV fluids, sodium down to 126 -Discontinue IV fluids and continue IV Lasix -Monitor BMP  Leukocytosis -Suspect reactive to recent surgery  Normocytic anemia -Stable hemoglobin currently 11.9, continue to monitor CBC  Acute anxiety -Resolved, suspect secondary from recent anesthesia -Patient returned from dental extraction on 04/03/2020 and felt that he could not feel his legs or move them and felt that his right upper extremity was shaking. -At bedside, patient appeared to be very anxious and was able to be calmed down.  Was able to move all 4 extremities. -CT head no  acute finding.  Minimal small vessel change of the hemispheric deep white matter  DVT Prophylaxis  heparin  Code Status: Full  Family Communication:  None at bedside  Disposition Plan:  Status is: Inpatient  Remains inpatient appropriate because:Ongoing diagnostic testing needed not appropriate for outpatient work up and Inpatient level of care appropriate due to severity of illness   Dispo: The patient is from: Home              [facility]              Anticipated d/c date is: > 3 days              Patient currently is not medically stable to d/c.  Consultants Cardiology Neurology Cardiothoracic surgery Dentistry  Procedures  Echocardiogram TEE Right and left heart catheterization Lower extremity Doppler Multiple extraction of tooth numbers 11, 22, 23, 26, 27, 28, and 29; Three Quadrants of alveoloplasty; Bilateral mandibular lingual tori reductions  Antibiotics   Anti-infectives (From admission, onward)   Start     Dose/Rate Route Frequency Ordered Stop   04/02/20 0400  vancomycin (VANCOREADY) IVPB 1250 mg/250 mL  Status:  Discontinued        1,250 mg 166.7 mL/hr over 90 Minutes Intravenous To Surgery 04/01/20 1045 04/01/20 1142   04/02/20 0400  levofloxacin (LEVAQUIN) IVPB 500 mg  Status:  Discontinued        500 mg 100 mL/hr over 60 Minutes Intravenous To Surgery 04/01/20 1045 04/01/20 1142   04/02/20 0000  clindamycin (CLEOCIN) IVPB 600 mg        600 mg 100 mL/hr over 30 Minutes Intravenous  Once 04/01/20 1850 04/03/20 0815      Subjective:   Jamie Katohomas Shakir seen and examined today.  Patient with no complaints this morning.  Does not recall the events that occurred yesterday after surgery.  Denies current chest pain, shortness of breath, abdominal pain, nausea or vomiting, diarrhea or constipation, dizziness or headache.  States he does feel both of his legs and is able to walk.  Objective:   Vitals:   04/04/20 0033 04/04/20 0422  04/04/20 0723 04/04/20 0807  BP: (!) 117/57 106/60 (!) 103/50 131/80  Pulse: 66 65 65 65  Resp: 18 15  18   Temp: 97.6 F (36.4 C) 97.6 F (36.4 C) 97.9 F (36.6 C)   TempSrc: Oral Oral Oral   SpO2: 95% 98% 99% 98%  Weight:  66.7 kg    Height:        Intake/Output Summary (Last 24 hours) at 04/04/2020 0916 Last data filed at 04/04/2020 0142 Gross per 24 hour  Intake 1577.05 ml  Output 3175 ml  Net -1597.95 ml   Filed Weights   04/02/20 0544 04/03/20 0521 04/04/20 0422  Weight: 65.6 kg 65.1 kg 66.7 kg   Exam  General: Well developed, chronically ill-appearing, NAD  HEENT: NCAT, mucous membranes moist.   Cardiovascular: S1 S2 auscultated, RRR.  Respiratory: Clear to auscultation bilaterally   Abdomen: Soft, nontender, nondistended, + bowel sounds  Extremities: warm dry without cyanosis clubbing or edema of right lower extremity.  LLE edema  Neuro: AAOx3, nonfocal  Psych: appropriate mood and affect, pleasant    Data Reviewed: I have personally reviewed following labs and imaging studies  CBC: Recent Labs  Lab 03/29/20 0941 03/29/20 0941 03/30/20 0309 03/30/20 1459 03/31/20 0341 04/01/20 0647 04/02/20 0430 04/03/20 0435 04/04/20 0210  WBC 13.7*   < > 12.0*  --  11.1* 10.6* 11.0* 10.8* 13.6*  NEUTROABS 10.7*  --  8.3*  --  7.3 7.2  --   --   --   HGB 12.9*   < > 12.7*   < > 12.8* 12.2* 12.6* 12.7* 11.9*  HCT 36.9*   < > 36.5*   < > 37.3* 36.9* 37.8* 36.9* 35.6*  MCV 84.6   < > 85.9  --  86.5 86.4 86.9 86.6 86.4  PLT 342   < > 356  --  355 331 344 351 312   < > = values in this interval not displayed.   Basic Metabolic Panel: Recent Labs  Lab 03/29/20 0941 03/29/20 0941 03/30/20 0309 03/30/20 1459 03/31/20 0341 04/01/20 0647 04/02/20 0430 04/03/20 0435 04/04/20 0210  NA 130*   < > 128*   < > 132* 130* 131* 127* 126*  K 4.2   < > 4.4   < > 4.5 4.0 4.4 4.3 3.9  CL 96*   < > 94*  --  93* 93* 91* 89* 89*  CO2 26   < > 25  --  29 30 31 30 27    GLUCOSE 221*   < > 264*  --  88 170* 154* 206* 172*  BUN 7*   < > 10  --  15 15 15 15 13   CREATININE 0.57*   < > 0.78  --  0.74 0.74 0.74 0.70 0.75  CALCIUM 8.9   < > 8.6*  --  9.3 8.7* 9.3 8.9 9.0  MG 1.6*  --  2.0  --  1.8 1.8  --   --   --   PHOS 3.6  --  5.4*  --  5.6* 4.2  --   --   --    < > =  values in this interval not displayed.   GFR: Estimated Creatinine Clearance: 86.1 mL/min (by C-G formula based on SCr of 0.75 mg/dL). Liver Function Tests: Recent Labs  Lab 03/29/20 0941 03/30/20 0309 03/31/20 0341 04/01/20 0647  AST 14* 16 18 14*  ALT 11 13 16 15   ALKPHOS 57 74 69 73  BILITOT 0.7 0.3 0.7 0.3  PROT 5.5* 5.6* 5.9* 5.4*  ALBUMIN 2.9* 2.9* 3.1* 2.8*   No results for input(s): LIPASE, AMYLASE in the last 168 hours. No results for input(s): AMMONIA in the last 168 hours. Coagulation Profile: No results for input(s): INR, PROTIME in the last 168 hours. Cardiac Enzymes: No results for input(s): CKTOTAL, CKMB, CKMBINDEX, TROPONINI in the last 168 hours. BNP (last 3 results) No results for input(s): PROBNP in the last 8760 hours. HbA1C: No results for input(s): HGBA1C in the last 72 hours. CBG: Recent Labs  Lab 04/03/20 2202 04/04/20 0037 04/04/20 0141 04/04/20 0250 04/04/20 0724  GLUCAP 446* 283* 209* 179* 112*   Lipid Profile: No results for input(s): CHOL, HDL, LDLCALC, TRIG, CHOLHDL, LDLDIRECT in the last 72 hours. Thyroid Function Tests: No results for input(s): TSH, T4TOTAL, FREET4, T3FREE, THYROIDAB in the last 72 hours. Anemia Panel: No results for input(s): VITAMINB12, FOLATE, FERRITIN, TIBC, IRON, RETICCTPCT in the last 72 hours. Urine analysis:    Component Value Date/Time   COLORURINE YELLOW 09/29/2012 1710   APPEARANCEUR CLEAR 09/29/2012 1710   LABSPEC >1.030 (H) 09/29/2012 1710   PHURINE 5.5 09/29/2012 1710   GLUCOSEU >1000 (A) 09/29/2012 1710   HGBUR TRACE (A) 09/29/2012 1710   BILIRUBINUR NEGATIVE 09/29/2012 1710   KETONESUR 15 (A)  09/29/2012 1710   PROTEINUR NEGATIVE 09/29/2012 1710   UROBILINOGEN 0.2 09/29/2012 1710   NITRITE NEGATIVE 09/29/2012 1710   LEUKOCYTESUR NEGATIVE 09/29/2012 1710   Sepsis Labs: @LABRCNTIP (procalcitonin:4,lacticidven:4)  ) Recent Results (from the past 240 hour(s))  SARS Coronavirus 2 by RT PCR (hospital order, performed in [facility] hospital lab) Nasopharyngeal Nasopharyngeal Swab     Status: None   Collection Time: 03/28/20  2:20 AM   Specimen: Nasopharyngeal Swab  Result Value Ref Range Status   SARS Coronavirus 2 NEGATIVE NEGATIVE Final    Comment: (NOTE) SARS-CoV-2 target nucleic acids are NOT DETECTED.  The SARS-CoV-2 RNA is generally detectable in upper and lower respiratory specimens during the acute phase of infection. The lowest concentration of SARS-CoV-2 viral copies this assay can detect is 250 copies / mL. A negative result does not preclude SARS-CoV-2 infection and should not be used as the sole basis for treatment or other patient management decisions.  A negative result may occur with improper specimen collection / handling, submission of specimen other than nasopharyngeal swab, presence of viral mutation(s) within the areas targeted by this assay, and inadequate number of viral copies (<250 copies / mL). A negative result must be combined with clinical observations, patient history, and epidemiological information.  Fact Sheet for Patients:   [facility]  Fact Sheet for Healthcare Providers: 03/30/20  This test is not yet approved or  cleared by the [employer] FDA and has been authorized for detection and/or diagnosis of SARS-CoV-2 by FDA under an Emergency Use Authorization (EUA).  This EUA will remain in effect (meaning this test can be used) for the duration of the COVID-19 declaration under Section 564(b)(1) of the Act, 21 U.S.C. section 360bbb-3(b)(1), unless the authorization is  terminated or revoked sooner.  Performed at [facility], [address], [place], [address] [place]  Radiology Studies: CT HEAD WO CONTRAST  Result Date: 04/03/2020 CLINICAL DATA:  Mental status change, unknown cause. EXAM: CT HEAD WITHOUT CONTRAST TECHNIQUE: Contiguous axial images were obtained from the base of the skull through the vertex without intravenous contrast. COMPARISON:  MRI 03/28/2020 FINDINGS: Brain: Other than minimal chronic small-vessel change of the deep white matter, the brain shows a normal appearance without evidence of malformation, atrophy, old or acute large vessel infarction, mass lesion, hemorrhage, hydrocephalus or extra-axial collection. Vascular: There is atherosclerotic calcification of the major vessels at the base of the brain. Skull: Normal.  No traumatic finding.  No focal bone lesion. Sinuses/Orbits: Sinuses are clear. Orbits appear normal. Mastoids are clear. Other: None significant IMPRESSION: No acute finding by CT. Minimal small-vessel change of the hemispheric deep white matter. Electronically Signed   By: Paulina Fusi M.D.   On: 04/03/2020 14:27     Scheduled Meds: . aspirin EC  81 mg Oral Daily  . atorvastatin  80 mg Oral Daily  . cyanocobalamin  1,000 mcg Intramuscular Daily  . feeding supplement (GLUCERNA SHAKE)  237 mL Oral TID BM  . furosemide  40 mg Intravenous BID  . heparin injection (subcutaneous)  5,000 Units Subcutaneous Q8H  . insulin aspart  0-5 Units Subcutaneous QHS  . insulin aspart  0-9 Units Subcutaneous TID WC  . insulin glargine  14 Units Subcutaneous Daily  . losartan  25 mg Oral Daily  . metoprolol tartrate  25 mg Oral BID  . multivitamin with minerals  1 tablet Oral Daily  . nicotine  21 mg Transdermal Daily  . polyethylene glycol  17 g Oral BID  . senna-docusate  1 tablet Oral BID  . sodium chloride flush  3 mL Intravenous Q12H  . spironolactone  12.5 mg Oral Daily   Continuous Infusions: . sodium chloride 10  mL/hr at 04/02/20 2340  . lactated ringers 10 mL/hr at 04/03/20 1201     LOS: 7 days   Time Spent in minutes   45 minutes  Pearce Littlefield D.O. on 04/04/2020 at 9:16 AM  Between 7am to 7pm - Please see pager noted on [email or web address]  After 7pm go to [email or web address]  And look for the night coverage person covering for me after hours  [facility] Office  [phone]

## 2020-04-04 NOTE — Progress Notes (Signed)
CARDIAC REHAB PHASE I   PRE:  Rate/Rhythm: 63 SR  BP:  Supine:   Sitting: 103/48  Standing:    SaO2: 98%RA  MODE:  Ambulation: 470 ft   POST:  Rate/Rhythm: 79 SR  BP:  Supine:   Sitting: 121/59  Standing:    SaO2: 98%RA 1009-1043 Pt walked 470 ft on RA with rolling walker and gait belt use with steady gait. Stopped once to rest. Tolerated well and happy to walk. No complaints.   Luetta Nutting, RN BSN  04/04/2020 10:41 AM

## 2020-04-05 LAB — BASIC METABOLIC PANEL
Anion gap: 10 (ref 5–15)
BUN: 10 mg/dL (ref 8–23)
CO2: 26 mmol/L (ref 22–32)
Calcium: 9 mg/dL (ref 8.9–10.3)
Chloride: 90 mmol/L — ABNORMAL LOW (ref 98–111)
Creatinine, Ser: 0.64 mg/dL (ref 0.61–1.24)
GFR calc Af Amer: >60 mL/min (ref >60)
GFR calc non Af Amer: >60 mL/min (ref >60)
Glucose, Bld: 120 mg/dL — ABNORMAL HIGH (ref 70–99)
Potassium: 4.5 mmol/L (ref 3.5–5.1)
Sodium: 126 mmol/L — ABNORMAL LOW (ref 135–145)

## 2020-04-05 LAB — GLUCOSE, CAPILLARY
Glucose-Capillary: 111 mg/dL — ABNORMAL HIGH (ref 70–99)
Glucose-Capillary: 169 mg/dL — ABNORMAL HIGH (ref 70–99)
Glucose-Capillary: 84 mg/dL (ref 70–99)
Glucose-Capillary: 259 mg/dL — ABNORMAL HIGH (ref 70–99)
Glucose-Capillary: 208 mg/dL — ABNORMAL HIGH (ref 70–99)

## 2020-04-05 LAB — CBC
HCT: 34.6 % — ABNORMAL LOW (ref 39.0–52.0)
Hemoglobin: 11.8 g/dL — ABNORMAL LOW (ref 13.0–17.0)
MCH: 29.5 pg (ref 26.0–34.0)
MCHC: 34.1 g/dL (ref 30.0–36.0)
MCV: 86.5 fL (ref 80.0–100.0)
Platelets: 310 10*3/uL (ref 150–400)
RBC: 4 MIL/uL — ABNORMAL LOW (ref 4.22–5.81)
RDW: 13.2 % (ref 11.5–15.5)
WBC: 11.4 10*3/uL — ABNORMAL HIGH (ref 4.0–10.5)
nRBC: 0 % (ref 0.0–0.2)

## 2020-04-05 NOTE — Progress Notes (Signed)
Progress Note  Patient Name: Scott Rios Date of Encounter: 04/05/2020  [facility] Cardiologist: No primary care provider on file.   Subjective   Denies any chest pain or dyspnea.  Net even yesterday.  Cr 0.75 ->0.64.  Sodium stable at 126.  Inpatient Medications    Scheduled Meds: . aspirin EC  81 mg Oral Daily  . atorvastatin  80 mg Oral Daily  . feeding supplement (GLUCERNA SHAKE)  237 mL Oral TID BM  . furosemide  40 mg Oral Daily  . heparin injection (subcutaneous)  5,000 Units Subcutaneous Q8H  . insulin aspart  0-5 Units Subcutaneous QHS  . insulin aspart  0-9 Units Subcutaneous TID WC  . insulin glargine  14 Units Subcutaneous Daily  . losartan  25 mg Oral Daily  . metoprolol tartrate  25 mg Oral BID  . multivitamin with minerals  1 tablet Oral Daily  . nicotine  21 mg Transdermal Daily  . polyethylene glycol  17 g Oral BID  . senna-docusate  1 tablet Oral BID  . sodium chloride flush  3 mL Intravenous Q12H  . spironolactone  12.5 mg Oral Daily   Continuous Infusions: . sodium chloride 10 mL/hr at 04/02/20 2340  . lactated ringers 10 mL/hr at 04/03/20 1201   PRN Meds: sodium chloride, acetaminophen, diphenhydrAMINE, HYDROcodone-acetaminophen, ondansetron (ZOFRAN) IV, sodium chloride flush   Vital Signs    Vitals:   04/04/20 2217 04/04/20 2352 04/05/20 0333 04/05/20 0758  BP: 126/62 (!) 126/55  121/66  Pulse: 79 75  72  Resp:  19 18 16   Temp:  98.1 F (36.7 C) 98.3 F (36.8 C) 99 F (37.2 C)  TempSrc:  Oral Oral Oral  SpO2:  97%  98%  Weight:   66.6 kg   Height:        Intake/Output Summary (Last 24 hours) at 04/05/2020 1039 Last data filed at 04/05/2020 0759 Gross per 24 hour  Intake 1143 ml  Output 1300 ml  Net -157 ml   Last 3 Weights 04/05/2020 04/04/2020 04/03/2020  Weight (lbs) 146 lb 14.3 oz 147 lb 1.6 oz 143 lb 9.4 oz  Weight (kg) 66.63 kg 66.724 kg 65.13 kg      Telemetry    NSR rate 60-70s, PVCs Personally Reviewed  Physical  Exam   GEN: Well nourished, well developed, in no acute distress  HEENT: normal  Neck: no JVD, carotid bruits, or masses Cardiac: RRR; no murmurs, rubs, or gallops,no edema  Respiratory:  clear to auscultation bilaterally, normal work of breathing GI: soft, nontender, nondistended, + BS MS: no deformity or atrophy  Skin: warm and dry, no rash Neuro:  Alert and Oriented x 3, Strength and sensation are intact Psych: euthymic mood, full affect    Labs    High Sensitivity Troponin:   Recent Labs  Lab 03/27/20 2317 03/28/20 0324 03/28/20 0819 03/28/20 1006 03/29/20 0941  TROPONINIHS 27* 121* 1,479* 1,747* 629*      Chemistry Recent Labs  Lab 03/30/20 0309 03/30/20 1459 03/31/20 0341 03/31/20 0341 04/01/20 0647 04/02/20 0430 04/03/20 0435 04/04/20 0210 04/05/20 0502  NA 128*   < > 132*   < > 130*   < > 127* 126* 126*  K 4.4   < > 4.5   < > 4.0   < > 4.3 3.9 4.5  CL 94*  --  93*   < > 93*   < > 89* 89* 90*  CO2 25  --  29   < >  30   < > 30 27 26   GLUCOSE 264*  --  88   < > 170*   < > 206* 172* 120*  BUN 10  --  15   < > 15   < > 15 13 10   CREATININE 0.78  --  0.74   < > 0.74   < > 0.70 0.75 0.64  CALCIUM 8.6*  --  9.3   < > 8.7*   < > 8.9 9.0 9.0  PROT 5.6*  --  5.9*  --  5.4*  --   --   --   --   ALBUMIN 2.9*  --  3.1*  --  2.8*  --   --   --   --   AST 16  --  18  --  14*  --   --   --   --   ALT 13  --  16  --  15  --   --   --   --   ALKPHOS 74  --  69  --  73  --   --   --   --   BILITOT 0.3  --  0.7  --  0.3  --   --   --   --   GFRNONAA >60  --  >60   < > >60   < > >60 >60 >60  GFRAA >60  --  >60   < > >60   < > >60 >60 >60  ANIONGAP 9  --  10   < > 7   < > 8 10 10    < > = values in this interval not displayed.     Hematology Recent Labs  Lab 04/03/20 0435 04/04/20 0210 04/05/20 0502  WBC 10.8* 13.6* 11.4*  RBC 4.26 4.12* 4.00*  HGB 12.7* 11.9* 11.8*  HCT 36.9* 35.6* 34.6*  MCV 86.6 86.4 86.5  MCH 29.8 28.9 29.5  MCHC 34.4 33.4 34.1  RDW 13.4  13.2 13.2  PLT 351 312 310    BNPNo results for input(s): BNP, PROBNP in the last 168 hours.   DDimer  No results for input(s): DDIMER in the last 168 hours.   Radiology    CT HEAD WO CONTRAST  Result Date: 04/03/2020 CLINICAL DATA:  Mental status change, unknown cause. EXAM: CT HEAD WITHOUT CONTRAST TECHNIQUE: Contiguous axial images were obtained from the base of the skull through the vertex without intravenous contrast. COMPARISON:  MRI 03/28/2020 FINDINGS: Brain: Other than minimal chronic small-vessel change of the deep white matter, the brain shows a normal appearance without evidence of malformation, atrophy, old or acute large vessel infarction, mass lesion, hemorrhage, hydrocephalus or extra-axial collection. Vascular: There is atherosclerotic calcification of the major vessels at the base of the brain. Skull: Normal.  No traumatic finding.  No focal bone lesion. Sinuses/Orbits: Sinuses are clear. Orbits appear normal. Mastoids are clear. Other: None significant IMPRESSION: No acute finding by CT. Minimal small-vessel change of the hemispheric deep white matter. Electronically Signed   By: 04/07/20 M.D.   On: 04/03/2020 14:27    Cardiac Studies   Diagnostic Dominance: Right   TEE: Severe aortic insufficiency, moderate to severe mitral regurgitation, EF 35%  Patient Profile     64 y.o. male with severe aortic insufficiency moderate to severe mitral regurgitation EF 35% triple-vessel coronary artery disease awaiting CABG/aortic valve replacement/possible mitral valve repair  Assessment & Plan    CAD/non-STEMI/severe aortic insufficiency/moderate mitral regurgitation/ischemic  cardiomyopathy/acute systolic heart failure/hyponatremia -Planning CABG/AVR/potential mitral repair with Dr Laneta Simmers -Echo with EF 35 to 40% -Continue ASA, statin, metoprolol, losartan, spironolactone -Hyponatremia with sodium 126.  On his RHC on 8/16, RA pressure was 2 and LVEDP was normal.  He appears  euvolemic on exam.  Suspect worsening hyponatremia due to diuresis, d/c'd IV lasix and switched to PO lasix 40 mg daily   For questions or updates, please contact [facility] Please consult [email or web address] for contact info under        Signed, Little Ishikawa, MD  04/05/2020, 10:39 AM

## 2020-04-05 NOTE — Progress Notes (Signed)
PROGRESS NOTE    [name]  JTT:[id number] DOB: [birth year] DOA: 03/27/2020 PCP: Elfredia Nevins, MD   Brief Narrative:  HPI on 03/28/2020 by Dr. Frankey Shown Scott Rios is a 64 y.o. male with medical history significant for hypertension and diabetes not on home medication, tobacco abuse and prior history of alcohol abuse (quit 10 years ago) who presents to the emergency department due to 59-month history of right leg weakness.  Patient states that the right leg sometimes go out on him.  He complained of difficulty in being able to stand after using the toilet last evening and he ended up crawling into the living room.  Right leg was numb and weak and with more difficulty in being able to raise the leg.  He complained of chronic constipation with last bowel movement pain 8 days ago, however there was no complaint of back pain, bowel or bladder changes.  Right hand was only significant for occasional tingling sensation.  He denies fever, chills, chest pain, shortness of breath or having any sick contact.  Interim history Patient was admitted with shortness of breath which improved with Lasix.  Also complained of leg weakness which is also improving but slowly.  Cardiology consulted and recommended CTA of the chest to rule out dissection and patient also underwent cardiac catheterization as well as TEE.  Patient was noted to have severe multivessel disease and significant aortic regurgitation require CABG as well as aortic valve replacement and possible mitral valve repair.  Cardiothoracic surgery consulted.  Dentistry also consulted, s/p multiple tooth extractions.  Assessment & Plan   Right leg weakness/back pain -Rule out structural lesion in the brain/lumbar spine/thiamine deficiency, CIDP -Appears to be improving -CT head showed no acute abnormality -CT lumbar spine without contrast showed no acute abnormality but did show multifactorial degenerative changes at L4-5 with resulting  severe spinal stenosis -Patient was placed on high-dose thiamine -TSH 1.92, repeated 2.465.   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  No acute osseous abnormality.  Chronic L4 compression fracture deformity with mild height loss.  Moderate to severe spinal canal narrowing at L4-5 level.  Moderate right L2-3, right L4-5 neural foraminal narrowing.  L3-4 disc bulge abutment of the existing L3, descending L4 nerve roots.  L5-S1 disc bulge abutment of the extending L5 and descending S1 nerve roots. 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  Mild ectasia of the ascending aorta measuring 3.7 cm in AP diameter.  Moderate bilateral pleural effusions with bibasilar atelectasis.  Patchy groundglass opacification of the mid to upper lungs may be due to infection inflammatory process.  1.1 cm right paratracheal lymph node likely reactive. -Echocardiogram showed an EF of 35 to 40%, grade 2 diastolic dysfunction along with significant AR and MR -Cardiology recommended right and left heart cath showing severe multivessel disease and severe aortic regurgitation and moderate to severe mitral regurgitation -TEE showed an EF of 35 to 40%.  No thrombus or mass noted.  Severe AI, moderate MR that is eccentric appears  partially due to annular dilation as appears to have a cleft/perforation adjacent to  P2. -Continue losartan, spironolactone, metoprolol, IV lasix  NSTEMI -Likely secondary to the above, troponin peaked at 1747 and trended downward to 629 -No complaints of chest pain although patient did have shortness of breath -Patient with severe CAD as noted above along with uncontrolled diabetes mellitus, type II -Cardiothoracic surgery consulted and appreciated-feels patient may benefit from CABG/AVR and mitral valve repair- pending on 04/09/2020 -Dentistry also consulted  Elevated D-dimer -Work-up as noted above -Lower extremity Dopplers negative for DVT  Severe aortic regurgitation/mitral regurgitation -Noted on TEE -Patient will benefit from replacement, cardiothoracic surgery already consulted  Dental caries -As above, dentistry consulted -s/p dental extraction on 04/03/2020  Diabetes mellitus, type II, uncontrolled with hyperglycemia -Hemoglobin A1c 10.2 -Please coordinator consulted and appreciated -Continue insulin sliding scale and CBG monitoring  Hypomagnesemia -Resolved with replacement, continue to monitor and supplement as needed  Hypoalbuminemia -Nutrition consulted and appreciated  Incidental pulmonary nodule/paratracheal lymph node -Noted on imaging noted above -Patient will need to follow-up outpatient  Tobacco abuse  -Smoking cessation -Continue nicotine patch  Hyponatremia -Despite being placed on IV fluids, sodium down to 126 -Discontinue IV fluids and continue IV Lasix -?if patient has been over diuresed -Monitor BMP  Leukocytosis -Suspect reactive to recent surgery  Normocytic anemia -Stable hemoglobin currently 11.8, continue to monitor CBC  Acute anxiety -Resolved, suspect secondary from recent anesthesia -Patient returned from dental extraction on 04/03/2020 and felt that he could not feel his legs or move them and felt that his right upper extremity was shaking. -At bedside, patient appeared to be very anxious and was able to be  calmed down.  Was able to move all 4 extremities. -CT head no acute finding.  Minimal small vessel change of the hemispheric deep white matter  DVT Prophylaxis  heparin  Code Status: Full  Family Communication:  None at bedside  Disposition Plan:  Status is: Inpatient  Remains inpatient appropriate because:Ongoing diagnostic testing needed not appropriate for outpatient work up and Inpatient level of care appropriate due to severity of illness   Dispo: The patient is from: Home              Anticipated d/c is to: Home w/ [facility]              Anticipated d/c date is: > 3 days              Patient currently is not medically stable to d/c.  Consultants Cardiology Neurology Cardiothoracic surgery Dentistry  Procedures  Echocardiogram TEE Right and left heart catheterization Lower extremity Doppler Multiple extraction of tooth numbers 11, 22, 23, 26, 27, 28, and 29; Three Quadrants of alveoloplasty; Bilateral mandibular lingual tori reductions  Antibiotics   Anti-infectives (From admission, onward)   Start     Dose/Rate Route Frequency Ordered Stop   04/02/20 0400  vancomycin (VANCOREADY) IVPB 1250 mg/250 mL  Status:  Discontinued        1,250 mg 166.7 mL/hr over 90 Minutes Intravenous To Surgery 04/01/20 1045 04/01/20 1142   04/02/20 0400  levofloxacin (LEVAQUIN) IVPB 500 mg  Status:  Discontinued        500 mg 100 mL/hr over 60 Minutes Intravenous To Surgery 04/01/20 1045 04/01/20 1142   04/02/20 0000  clindamycin (CLEOCIN) IVPB 600 mg        600 mg 100 mL/hr over 30 Minutes Intravenous  Once 04/01/20 1850 04/03/20 0815  Subjective:   Dow Blahnik seen and examined today.   Patient with no complaints this morning.  States he feels much better than he did yesterday.  Plans to get up and walk down the hallway.  Denies current chest pain, shortness of breath, abdominal pain, nausea or vomiting, diarrhea or constipation, dizziness or headache.  States he is very to have a  surgery done and over with.    Objective:   Vitals:   04/04/20 2217 04/04/20 2352 04/05/20 0333 04/05/20 0758  BP: 126/62 (!) 126/55  121/66  Pulse: 79 75  72  Resp:  19 18 16   Temp:  98.1 F (36.7 C) 98.3 F (36.8 C) 99 F (37.2 C)  TempSrc:  Oral Oral Oral  SpO2:  97%  98%  Weight:   66.6 kg   Height:        Intake/Output Summary (Last 24 hours) at 04/05/2020 1143 Last data filed at 04/05/2020 0759 Gross per 24 hour  Intake 903 ml  Output 1300 ml  Net -397 ml   Filed Weights   04/03/20 0521 04/04/20 0422 04/05/20 0333  Weight: 65.1 kg 66.7 kg 66.6 kg   Exam  General: Well developed, chronically ill-appearing, NAD  HEENT: NCAT, mucous membranes moist.    Cardiovascular: S1 S2 auscultated, RRR, no murmur  Respiratory: Clear to auscultation bilaterally with equal chest rise  Abdomen: Soft, nontender, nondistended, + bowel sounds  Extremities: warm dry without cyanosis clubbing or edema of the right lower extremity.  Left lower extremity edema  Neuro: AAOx3, nonfocal  Psych: appropriate mood and affect, pleasant  Data Reviewed: I have personally reviewed following labs and imaging studies  CBC: Recent Labs  Lab 03/30/20 0309 03/30/20 1459 03/31/20 0341 03/31/20 0341 04/01/20 0647 04/02/20 0430 04/03/20 0435 04/04/20 0210 04/05/20 0502  WBC 12.0*  --  11.1*   < > 10.6* 11.0* 10.8* 13.6* 11.4*  NEUTROABS 8.3*  --  7.3  --  7.2  --   --   --   --   HGB 12.7*   < > 12.8*   < > 12.2* 12.6* 12.7* 11.9* 11.8*  HCT 36.5*   < > 37.3*   < > 36.9* 37.8* 36.9* 35.6* 34.6*  MCV 85.9  --  86.5   < > 86.4 86.9 86.6 86.4 86.5  PLT 356  --  355   < > 331 344 351 312 310   < > = values in this interval not displayed.   Basic Metabolic Panel: Recent Labs  Lab 03/30/20 0309 03/30/20 1459 03/31/20 0341 03/31/20 0341 04/01/20 0647 04/02/20 0430 04/03/20 0435 04/04/20 0210 04/05/20 0502  NA 128*   < > 132*   < > 130* 131* 127* 126* 126*  K 4.4   < > 4.5   < >  4.0 4.4 4.3 3.9 4.5  CL 94*  --  93*   < > 93* 91* 89* 89* 90*  CO2 25  --  29   < > 30 31 30 27 26   GLUCOSE 264*  --  88   < > 170* 154* 206* 172* 120*  BUN 10  --  15   < > 15 15 15 13 10   CREATININE 0.78  --  0.74   < > 0.74 0.74 0.70 0.75 0.64  CALCIUM 8.6*  --  9.3   < > 8.7* 9.3 8.9 9.0 9.0  MG 2.0  --  1.8  --  1.8  --   --   --   --  PHOS 5.4*  --  5.6*  --  4.2  --   --   --   --    < > = values in this interval not displayed.   GFR: Estimated Creatinine Clearance: 86.1 mL/min (by C-G formula based on SCr of 0.64 mg/dL). Liver Function Tests: Recent Labs  Lab 03/30/20 0309 03/31/20 0341 04/01/20 0647  AST 16 18 14*  ALT 13 16 15   ALKPHOS 74 69 73  BILITOT 0.3 0.7 0.3  PROT 5.6* 5.9* 5.4*  ALBUMIN 2.9* 3.1* 2.8*   No results for input(s): LIPASE, AMYLASE in the last 168 hours. No results for input(s): AMMONIA in the last 168 hours. Coagulation Profile: No results for input(s): INR, PROTIME in the last 168 hours. Cardiac Enzymes: No results for input(s): CKTOTAL, CKMB, CKMBINDEX, TROPONINI in the last 168 hours. BNP (last 3 results) No results for input(s): PROBNP in the last 8760 hours. HbA1C: No results for input(s): HGBA1C in the last 72 hours. CBG: Recent Labs  Lab 04/04/20 1129 04/04/20 1609 04/04/20 2107 04/05/20 0611 04/05/20 0756  GLUCAP 236* 198* 258* 111* 169*   Lipid Profile: No results for input(s): CHOL, HDL, LDLCALC, TRIG, CHOLHDL, LDLDIRECT in the last 72 hours. Thyroid Function Tests: No results for input(s): TSH, T4TOTAL, FREET4, T3FREE, THYROIDAB in the last 72 hours. Anemia Panel: No results for input(s): VITAMINB12, FOLATE, FERRITIN, TIBC, IRON, RETICCTPCT in the last 72 hours. Urine analysis:    Component Value Date/Time   COLORURINE YELLOW 09/29/2012 1710   APPEARANCEUR CLEAR 09/29/2012 1710   LABSPEC >1.030 (H) 09/29/2012 1710   PHURINE 5.5 09/29/2012 1710   GLUCOSEU >1000 (A) 09/29/2012 1710   HGBUR TRACE (A) 09/29/2012 1710    BILIRUBINUR NEGATIVE 09/29/2012 1710   KETONESUR 15 (A) 09/29/2012 1710   PROTEINUR NEGATIVE 09/29/2012 1710   UROBILINOGEN 0.2 09/29/2012 1710   NITRITE NEGATIVE 09/29/2012 1710   LEUKOCYTESUR NEGATIVE 09/29/2012 1710   Sepsis Labs: @LABRCNTIP (procalcitonin:4,lacticidven:4)  ) Recent Results (from the past 240 hour(s))  SARS Coronavirus 2 by RT PCR (hospital order, performed in [facility] hospital lab) Nasopharyngeal Nasopharyngeal Swab     Status: None   Collection Time: 03/28/20  2:20 AM   Specimen: Nasopharyngeal Swab  Result Value Ref Range Status   SARS Coronavirus 2 NEGATIVE NEGATIVE Final    Comment: (NOTE) SARS-CoV-2 target nucleic acids are NOT DETECTED.  The SARS-CoV-2 RNA is generally detectable in upper and lower respiratory specimens during the acute phase of infection. The lowest concentration of SARS-CoV-2 viral copies this assay can detect is 250 copies / mL. A negative result does not preclude SARS-CoV-2 infection and should not be used as the sole basis for treatment or other patient management decisions.  A negative result may occur with improper specimen collection / handling, submission of specimen other than nasopharyngeal swab, presence of viral mutation(s) within the areas targeted by this assay, and inadequate number of viral copies (<250 copies / mL). A negative result must be combined with clinical observations, patient history, and epidemiological information.  Fact Sheet for Patients:   BoilerBrush.com.cyhttps://www.fda.gov/media/136312/download  Fact Sheet for Healthcare Providers: https://pope.com/https://www.fda.gov/media/136313/download  This test is not yet approved or  cleared by the [place] FDA and has been authorized for detection and/or diagnosis of SARS-CoV-2 by FDA under an Emergency Use Authorization (EUA).  This EUA will remain in effect (meaning this test can be used) for the duration of the COVID-19 declaration under Section 564(b)(1) of the Act, 21  U.S.C. section 360bbb-3(b)(1), unless the  authorization is terminated or revoked sooner.  Performed at [facility], [address] [place], [place] [address]       Radiology Studies: CT HEAD WO CONTRAST  Result Date: 04/03/2020 CLINICAL DATA:  Mental status change, unknown cause. EXAM: CT HEAD WITHOUT CONTRAST TECHNIQUE: Contiguous axial images were obtained from the base of the skull through the vertex without intravenous contrast. COMPARISON:  MRI 03/28/2020 FINDINGS: Brain: Other than minimal chronic small-vessel change of the deep white matter, the brain shows a normal appearance without evidence of malformation, atrophy, old or acute large vessel infarction, mass lesion, hemorrhage, hydrocephalus or extra-axial collection. Vascular: There is atherosclerotic calcification of the major vessels at the base of the brain. Skull: Normal.  No traumatic finding.  No focal bone lesion. Sinuses/Orbits: Sinuses are clear. Orbits appear normal. Mastoids are clear. Other: None significant IMPRESSION: No acute finding by CT. Minimal small-vessel change of the hemispheric deep white matter. Electronically Signed   By: Paulina Fusi M.D.   On: 04/03/2020 14:27     Scheduled Meds: . aspirin EC  81 mg Oral Daily  . atorvastatin  80 mg Oral Daily  . feeding supplement (GLUCERNA SHAKE)  237 mL Oral TID BM  . furosemide  40 mg Oral Daily  . heparin injection (subcutaneous)  5,000 Units Subcutaneous Q8H  . insulin aspart  0-5 Units Subcutaneous QHS  . insulin aspart  0-9 Units Subcutaneous TID WC  . insulin glargine  14 Units Subcutaneous Daily  . losartan  25 mg Oral Daily  . metoprolol tartrate  25 mg Oral BID  . multivitamin with minerals  1 tablet Oral Daily  . nicotine  21 mg Transdermal Daily  . polyethylene glycol  17 g Oral BID  . senna-docusate  1 tablet Oral BID  . sodium chloride flush  3 mL Intravenous Q12H  . spironolactone  12.5 mg Oral Daily   Continuous Infusions: . sodium chloride  10 mL/hr at 04/02/20 2340  . lactated ringers 10 mL/hr at 04/03/20 1201     LOS: 8 days   Time Spent in minutes   30 minutes  Keriana Sarsfield D.O. on 04/05/2020 at 11:43 AM  Between 7am to 7pm - Please see pager noted on [email or web address]  After 7pm go to [email or web address]  And look for the night coverage person covering for me after hours  [facility] Office  [phone]

## 2020-04-06 LAB — GLUCOSE, CAPILLARY
Glucose-Capillary: 92 mg/dL (ref 70–99)
Glucose-Capillary: 181 mg/dL — ABNORMAL HIGH (ref 70–99)
Glucose-Capillary: 178 mg/dL — ABNORMAL HIGH (ref 70–99)
Glucose-Capillary: 311 mg/dL — ABNORMAL HIGH (ref 70–99)

## 2020-04-06 LAB — BASIC METABOLIC PANEL
Anion gap: 11 (ref 5–15)
BUN: 10 mg/dL (ref 8–23)
CO2: 25 mmol/L (ref 22–32)
Calcium: 9.2 mg/dL (ref 8.9–10.3)
Chloride: 95 mmol/L — ABNORMAL LOW (ref 98–111)
Creatinine, Ser: 0.73 mg/dL (ref 0.61–1.24)
GFR calc Af Amer: >60 mL/min (ref >60)
GFR calc non Af Amer: >60 mL/min (ref >60)
Glucose, Bld: 94 mg/dL (ref 70–99)
Potassium: 4.6 mmol/L (ref 3.5–5.1)
Sodium: 131 mmol/L — ABNORMAL LOW (ref 135–145)

## 2020-04-06 NOTE — Progress Notes (Signed)
Progress Note  Patient Name: Scott Rios Date of Encounter: 04/06/2020  [facility] Cardiologist: No primary care provider on file.   Subjective   Denies any chest pain or dyspnea.  Sodium improved to 131.  Inpatient Medications    Scheduled Meds: . aspirin EC  81 mg Oral Daily  . atorvastatin  80 mg Oral Daily  . feeding supplement (GLUCERNA SHAKE)  237 mL Oral TID BM  . furosemide  40 mg Oral Daily  . heparin injection (subcutaneous)  5,000 Units Subcutaneous Q8H  . insulin aspart  0-5 Units Subcutaneous QHS  . insulin aspart  0-9 Units Subcutaneous TID WC  . insulin glargine  14 Units Subcutaneous Daily  . losartan  25 mg Oral Daily  . metoprolol tartrate  25 mg Oral BID  . multivitamin with minerals  1 tablet Oral Daily  . nicotine  21 mg Transdermal Daily  . polyethylene glycol  17 g Oral BID  . senna-docusate  1 tablet Oral BID  . sodium chloride flush  3 mL Intravenous Q12H  . spironolactone  12.5 mg Oral Daily   Continuous Infusions: . sodium chloride 10 mL/hr at 04/02/20 2340  . lactated ringers 10 mL/hr at 04/03/20 1201   PRN Meds: sodium chloride, acetaminophen, diphenhydrAMINE, HYDROcodone-acetaminophen, ondansetron (ZOFRAN) IV, sodium chloride flush   Vital Signs    Vitals:   04/05/20 2144 04/05/20 2357 04/06/20 0605 04/06/20 0745  BP: (!) 134/59 134/71 121/61 117/60  Pulse: 85 85 73 69  Resp:  17 18 18   Temp:  98.5 F (36.9 C) 97.8 F (36.6 C) 98.5 F (36.9 C)  TempSrc:  Oral Oral Oral  SpO2:  99% 95% 99%  Weight:   64.9 kg   Height:        Intake/Output Summary (Last 24 hours) at 04/06/2020 0952 Last data filed at 04/05/2020 2149 Gross per 24 hour  Intake 843 ml  Output 500 ml  Net 343 ml   Last 3 Weights 04/06/2020 04/05/2020 04/04/2020  Weight (lbs) 143 lb 146 lb 14.3 oz 147 lb 1.6 oz  Weight (kg) 64.864 kg 66.63 kg 66.724 kg      Telemetry    NSR rate 80s Personally Reviewed  Physical Exam   GEN: Well nourished, well  developed, in no acute distress  HEENT: normal  Neck: no JVD, carotid bruits, or masses Cardiac: RRR; no murmurs, rubs, or gallops,no edema  Respiratory:  clear to auscultation bilaterally, normal work of breathing GI: soft, nontender, nondistended, + BS MS: no deformity or atrophy  Skin: warm and dry, no rash Neuro:  Alert and Oriented x 3, Strength and sensation are intact Psych: euthymic mood, full affect    Labs    High Sensitivity Troponin:   Recent Labs  Lab 03/27/20 2317 03/28/20 0324 03/28/20 0819 03/28/20 1006 03/29/20 0941  TROPONINIHS 27* 121* 1,479* 1,747* 629*      Chemistry Recent Labs  Lab 03/31/20 0341 03/31/20 0341 04/01/20 0647 04/02/20 0430 04/04/20 0210 04/05/20 0502 04/06/20 0613  NA 132*   < > 130*   < > 126* 126* 131*  K 4.5   < > 4.0   < > 3.9 4.5 4.6  CL 93*   < > 93*   < > 89* 90* 95*  CO2 29   < > 30   < > 27 26 25   GLUCOSE 88   < > 170*   < > 172* 120* 94  BUN 15   < >  15   < > 13 10 10   CREATININE 0.74   < > 0.74   < > 0.75 0.64 0.73  CALCIUM 9.3   < > 8.7*   < > 9.0 9.0 9.2  PROT 5.9*  --  5.4*  --   --   --   --   ALBUMIN 3.1*  --  2.8*  --   --   --   --   AST 18  --  14*  --   --   --   --   ALT 16  --  15  --   --   --   --   ALKPHOS 69  --  73  --   --   --   --   BILITOT 0.7  --  0.3  --   --   --   --   GFRNONAA >60   < > >60   < > >60 >60 >60  GFRAA >60   < > >60   < > >60 >60 >60  ANIONGAP 10   < > 7   < > 10 10 11    < > = values in this interval not displayed.     Hematology Recent Labs  Lab 04/03/20 0435 04/04/20 0210 04/05/20 0502  WBC 10.8* 13.6* 11.4*  RBC 4.26 4.12* 4.00*  HGB 12.7* 11.9* 11.8*  HCT 36.9* 35.6* 34.6*  MCV 86.6 86.4 86.5  MCH 29.8 28.9 29.5  MCHC 34.4 33.4 34.1  RDW 13.4 13.2 13.2  PLT 351 312 310    BNPNo results for input(s): BNP, PROBNP in the last 168 hours.   DDimer  No results for input(s): DDIMER in the last 168 hours.   Radiology    No results found.  Cardiac Studies     Diagnostic Dominance: Right   TEE: Severe aortic insufficiency, moderate to severe mitral regurgitation, EF 35%  Patient Profile     64 y.o. male with severe aortic insufficiency moderate to severe mitral regurgitation EF 35% triple-vessel coronary artery disease awaiting CABG/aortic valve replacement/possible mitral valve repair  Assessment & Plan    CAD/non-STEMI/severe aortic insufficiency/moderate mitral regurgitation/ischemic cardiomyopathy/acute systolic heart failure/hyponatremia -Planning CABG/AVR/potential mitral repair with Dr 04/07/20 -Echo with EF 35 to 40% -Continue ASA, statin, metoprolol, losartan, spironolactone -Hyponatremia improved with discontinuing IV lasix.  Appears euvolemic, continue PO lasix.   For questions or updates, please contact [facility] Please consult [email or web address] for contact info under        Signed, 64, MD  04/06/2020, 9:52 AM

## 2020-04-06 NOTE — Progress Notes (Signed)
Came to ambulate however pt sleeping soundly at this time. Will allow to sleep and follow up tomorrow. Ethelda Chick CES, ACSM 3:16 PM 04/06/2020

## 2020-04-06 NOTE — Plan of Care (Signed)

## 2020-04-06 NOTE — Progress Notes (Signed)
POST OPERATIVE NOTE:  04/06/2020 Scott Rios [id number]   VITALS: BP 117/60 (BP Location: Left Arm)    Pulse 69    Temp 98.5 F (36.9 C) (Oral)    Resp 18    Ht 5\' 7"  (1.702 m)    Wt 64.9 kg    SpO2 99%    BMI 22.40 kg/m   LABS:  Lab Results  Component Value Date   WBC 11.4 (H) 04/05/2020   HGB 11.8 (L) 04/05/2020   HCT 34.6 (L) 04/05/2020   MCV 86.5 04/05/2020   PLT 310 04/05/2020   BMET    Component Value Date/Time   NA 131 (L) 04/06/2020 0613   K 4.6 04/06/2020 0613   CL 95 (L) 04/06/2020 0613   CO2 25 04/06/2020 0613   GLUCOSE 94 04/06/2020 0613   BUN 10 04/06/2020 0613   CREATININE 0.73 04/06/2020 0613   CALCIUM 9.2 04/06/2020 0613   GFRNONAA >60 04/06/2020 0613   GFRAA >60 04/06/2020 04/08/2020    Lab Results  Component Value Date   INR 1.1 03/28/2020   No results found for: PTT   Scott Rios is status post multiple extractions with alveoloplasty and bilateral mandibular lingual tori reductions on 04/03/2020 in the operating room with general anesthesia.  SUBJECTIVE: Patient with minimal discomfort.  Patient denies any active bleeding.  EXAM: No sign of infection, heme, or ooze.  Sutures are intact.  Patient is healing in by generalized primary closure. Patient is edentulous.  ASSESSMENT: Post operative course is consistent with dental procedures performed in the operating room with general anesthesia Loss of teeth due to extraction Patient is now clinically edentulous with exception of impacted 1 and 16.  PLAN: 1.  Continue chlorhexidine rinses after breakfast and at bedtime. 2.  Use salt water rinses as needed every 2 hours while awake in between the chlorhexidine rinses 3.  Advance diet as tolerated. 4.  Patient is currently cleared for heart valve surgery at the discretion of the heart surgeon. 5.  Follow-up with dentist of choice for fabrication of upper and lower complete dentures before after oral surgery evaluation for removal of impacted 1 and  16.   04/05/2020, DDS

## 2020-04-06 NOTE — Progress Notes (Deleted)
PROGRESS NOTE    Scott Rios  JTT:[id number] DOB: [birth year] DOA: 03/27/2020 PCP: Elfredia Nevins, MD   Brief Narrative:  HPI on 03/28/2020 by Dr. Frankey Shown Scott Rios is a 64 y.o. male with medical history significant for hypertension and diabetes not on home medication, tobacco abuse and prior history of alcohol abuse (quit 10 years ago) who presents to the emergency department due to 59-month history of right leg weakness.  Patient states that the right leg sometimes go out on him.  He complained of difficulty in being able to stand after using the toilet last evening and he ended up crawling into the living room.  Right leg was numb and weak and with more difficulty in being able to raise the leg.  He complained of chronic constipation with last bowel movement pain 8 days ago, however there was no complaint of back pain, bowel or bladder changes.  Right hand was only significant for occasional tingling sensation.  He denies fever, chills, chest pain, shortness of breath or having any sick contact.  Interim history Patient was admitted with shortness of breath which improved with Lasix.  Also complained of leg weakness which is also improving but slowly.  Cardiology consulted and recommended CTA of the chest to rule out dissection and patient also underwent cardiac catheterization as well as TEE.  Patient was noted to have severe multivessel disease and significant aortic regurgitation require CABG as well as aortic valve replacement and possible mitral valve repair.  Cardiothoracic surgery consulted.  Dentistry also consulted, s/p multiple tooth extractions.  Assessment & Plan   Right leg weakness/back pain -Rule out structural lesion in the brain/lumbar spine/thiamine deficiency, CIDP -Appears to be improving -CT head showed no acute abnormality -CT lumbar spine without contrast showed no acute abnormality but did show multifactorial degenerative changes at L4-5 with resulting  severe spinal stenosis -Patient was placed on high-dose thiamine -TSH 1.92, repeated 2.465.   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  No acute osseous abnormality.  Chronic L4 compression fracture deformity with mild height loss.  Moderate to severe spinal canal narrowing at L4-5 level.  Moderate right L2-3, right L4-5 neural foraminal narrowing.  L3-4 disc bulge abutment of the existing L3, descending L4 nerve roots.  L5-S1 disc bulge abutment of the extending L5 and descending S1 nerve roots. 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  Mild ectasia of the ascending aorta measuring 3.7 cm in AP diameter.  Moderate bilateral pleural effusions with bibasilar atelectasis.  Patchy groundglass opacification of the mid to upper lungs may be due to infection inflammatory process.  1.1 cm right paratracheal lymph node likely reactive. -Echocardiogram showed an EF of 35 to 40%, grade 2 diastolic dysfunction along with significant AR and MR -Cardiology recommended right and left heart cath showing severe multivessel disease and severe aortic regurgitation and moderate to severe mitral regurgitation -TEE showed an EF of 35 to 40%.  No thrombus or mass noted.  Severe AI, moderate MR that is eccentric appears  partially due to annular dilation as appears to have a cleft/perforation adjacent to  P2. -Continue losartan, spironolactone, metoprolol -IV lasix discontinued on 8/22  NSTEMI -Likely secondary to the above, troponin peaked at 1747 and trended downward to 629 -No complaints of chest pain although patient did have shortness of breath -Patient with severe CAD as noted above along with uncontrolled diabetes mellitus, type II -Cardiothoracic surgery consulted and appreciated-feels patient may benefit from CABG/AVR and mitral valve repair- pending on 04/09/2020  Elevated D-dimer -Work-up as noted above -Lower extremity Dopplers negative for DVT  Severe aortic regurgitation/mitral regurgitation -Noted on TEE -Patient will benefit from replacement, cardiothoracic surgery already consulted  Dental caries -As above, dentistry consulted -s/p dental extraction on 04/03/2020  Diabetes mellitus, type II, uncontrolled with hyperglycemia -Hemoglobin A1c 10.2 -Please coordinator consulted and appreciated -Continue insulin sliding scale and CBG monitoring  Hypomagnesemia -Resolved with replacement, continue to monitor and supplement as needed  Hypoalbuminemia -Nutrition consulted and appreciated  Incidental pulmonary nodule/paratracheal lymph node -Noted on imaging noted above -Patient will need to follow-up outpatient  Tobacco abuse  -Smoking cessation -Continue nicotine patch  Hyponatremia -Despite being placed on IV fluids, sodium was down to 126 -Discontinued Lasix on 04/05/2020 -Sodium today is improved to 131 -Monitor BMP  Leukocytosis -Suspect reactive to recent surgery  Normocytic anemia -Stable hemoglobin currently 11.8, continue to monitor CBC  Acute anxiety -Resolved, suspect secondary from recent anesthesia -Patient returned from dental extraction on 04/03/2020 and felt that he could not feel his legs or move them and felt that his right upper extremity was shaking. -At bedside, patient appeared to be very anxious and was able to be calmed down.  Was  able to move all 4 extremities. -CT head no acute finding.  Minimal small vessel change of the hemispheric deep white matter  DVT Prophylaxis  heparin  Code Status: Full  Family Communication:  None at bedside  Disposition Plan:  Status is: Inpatient  Remains inpatient appropriate because:Ongoing diagnostic testing needed not appropriate for outpatient work up and Inpatient level of care appropriate due to severity of illness   Dispo: The patient is from: Home              Anticipated d/c is to: Home w/ [facility]              Anticipated d/c date is: > 3 days              Patient currently is not medically stable to d/c.  Consultants Cardiology Neurology Cardiothoracic surgery Dentistry  Procedures  Echocardiogram TEE Right and left heart catheterization Lower extremity Doppler Multiple extraction of tooth numbers 11, 22, 23, 26, 27, 28, and 29; Three Quadrants of alveoloplasty; Bilateral mandibular lingual tori reductions  Antibiotics   Anti-infectives (From admission, onward)   Start     Dose/Rate Route Frequency Ordered Stop   04/02/20 0400  vancomycin (VANCOREADY) IVPB 1250 mg/250 mL  Status:  Discontinued        1,250 mg 166.7 mL/hr over 90 Minutes Intravenous To Surgery 04/01/20 1045 04/01/20 1142   04/02/20 0400  levofloxacin (LEVAQUIN) IVPB 500 mg  Status:  Discontinued        500 mg 100 mL/hr over 60 Minutes Intravenous To Surgery 04/01/20 1045 04/01/20 1142   04/02/20 0000  clindamycin (CLEOCIN) IVPB 600 mg        600 mg 100 mL/hr over 30 Minutes Intravenous  Once 04/01/20 1850 04/03/20 0815      Subjective:  Scott Rios seen and examined today.   Patient feels that his leg swelling has improved today.  Was able to get up and walk around yesterday and plans to Rios the same today.  Currently denies chest pain, shortness of breath, abdominal pain, nausea or vomiting, diarrhea or constipation.  Would like to get his surgery done over with.    Objective:    Vitals:   04/05/20 2144 04/05/20 2357 04/06/20 0605 04/06/20 0745  BP: (!) 134/59 134/71 121/61 117/60  Pulse: 85 85 73 69  Resp:  17 18 18   Temp:  98.5 F (36.9 C) 97.8 F (36.6 C) 98.5 F (36.9 C)  TempSrc:  Oral Oral Oral  SpO2:  99% 95% 99%  Weight:   64.9 kg   Height:        Intake/Output Summary (Last 24 hours) at 04/06/2020 1401 Last data filed at 04/05/2020 2149 Gross per 24 hour  Intake 363 ml  Output 500 ml  Net -137 ml   Filed Weights   04/04/20 0422 04/05/20 0333 04/06/20 0605  Weight: 66.7 kg 66.6 kg 64.9 kg   Exam  General: Well developed, chronically ill-appearing, NAD  HEENT: NCAT, mucous membranes moist.  Edentulous  Cardiovascular: S1 S2 auscultated, RRR, no murmur  Respiratory: Clear to auscultation bilaterally   Abdomen: Soft, nontender, nondistended, + bowel sounds  Extremities: warm dry without cyanosis clubbing. B/L LE edema  Neuro: AAOx3, nonfocal  Psych: appropriate mood and affect, pleasant  Data Reviewed: I have personally reviewed following labs and imaging studies  CBC: Recent Labs  Lab 03/31/20 0341 03/31/20 0341 04/01/20 0647 04/02/20 0430 04/03/20 0435 04/04/20 0210 04/05/20 0502  WBC 11.1*   < > 10.6* 11.0* 10.8* 13.6* 11.4*  NEUTROABS 7.3  --  7.2  --   --   --   --   HGB 12.8*   < > 12.2* 12.6* 12.7* 11.9* 11.8*  HCT 37.3*   < > 36.9* 37.8* 36.9* 35.6* 34.6*  MCV 86.5   < > 86.4 86.9 86.6 86.4 86.5  PLT 355   < > 331 344 351 312 310   < > = values in this interval not displayed.   Basic Metabolic Panel: Recent Labs  Lab 03/31/20 0341 03/31/20 0341 04/01/20 0647 04/01/20 0647 04/02/20 0430 04/03/20 0435 04/04/20 0210 04/05/20 0502 04/06/20 0613  NA 132*   < > 130*   < > 131* 127* 126* 126* 131*  K 4.5   < > 4.0   < > 4.4 4.3 3.9 4.5 4.6  CL 93*   < > 93*   < > 91* 89* 89* 90* 95*  CO2 29   < > 30   < > 31 30 27 26 25   GLUCOSE 88   < > 170*   < > 154* 206* 172* 120* 94  BUN 15   < > 15   < > 15 15 13  10 10   CREATININE 0.74   < > 0.74   < > 0.74 0.70 0.75 0.64 0.73  CALCIUM 9.3   < > 8.7*   < > 9.3 8.9 9.0 9.0 9.2  MG 1.8  --  1.8  --   --   --   --   --   --   PHOS 5.6*  --  4.2  --   --   --   --   --   --    < > = values in this interval not displayed.  GFR: Estimated Creatinine Clearance: 84.5 mL/min (by C-G formula based on SCr of 0.73 mg/dL). Liver Function Tests: Recent Labs  Lab 03/31/20 0341 04/01/20 0647  AST 18 14*  ALT 16 15  ALKPHOS 69 73  BILITOT 0.7 0.3  PROT 5.9* 5.4*  ALBUMIN 3.1* 2.8*   No results for input(s): LIPASE, AMYLASE in the last 168 hours. No results for input(s): AMMONIA in the last 168 hours. Coagulation Profile: No results for input(s): INR, PROTIME in the last 168 hours. Cardiac Enzymes: No results for input(s): CKTOTAL, CKMB, CKMBINDEX, TROPONINI in the last 168 hours. BNP (last 3 results) No results for input(s): PROBNP in the last 8760 hours. HbA1C: No results for input(s): HGBA1C in the last 72 hours. CBG: Recent Labs  Lab 04/05/20 1143 04/05/20 1639 04/05/20 2138 04/06/20 0738 04/06/20 1202  GLUCAP 84 259* 208* 92 181*   Lipid Profile: No results for input(s): CHOL, HDL, LDLCALC, TRIG, CHOLHDL, LDLDIRECT in the last 72 hours. Thyroid Function Tests: No results for input(s): TSH, T4TOTAL, FREET4, T3FREE, THYROIDAB in the last 72 hours. Anemia Panel: No results for input(s): VITAMINB12, FOLATE, FERRITIN, TIBC, IRON, RETICCTPCT in the last 72 hours. Urine analysis:    Component Value Date/Time   COLORURINE YELLOW 09/29/2012 1710   APPEARANCEUR CLEAR 09/29/2012 1710   LABSPEC >1.030 (H) 09/29/2012 1710   PHURINE 5.5 09/29/2012 1710   GLUCOSEU >1000 (A) 09/29/2012 1710   HGBUR TRACE (A) 09/29/2012 1710   BILIRUBINUR NEGATIVE 09/29/2012 1710   KETONESUR 15 (A) 09/29/2012 1710   PROTEINUR NEGATIVE 09/29/2012 1710   UROBILINOGEN 0.2 09/29/2012 1710   NITRITE NEGATIVE 09/29/2012 1710   LEUKOCYTESUR NEGATIVE 09/29/2012 1710    Sepsis Labs: @LABRCNTIP (procalcitonin:4,lacticidven:4)  ) Recent Results (from the past 240 hour(s))  SARS Coronavirus 2 by RT PCR (hospital order, performed in [facility] hospital lab) Nasopharyngeal Nasopharyngeal Swab     Status: None   Collection Time: 03/28/20  2:20 AM   Specimen: Nasopharyngeal Swab  Result Value Ref Range Status   SARS Coronavirus 2 NEGATIVE NEGATIVE Final    Comment: (NOTE) SARS-CoV-2 target nucleic acids are NOT DETECTED.  The SARS-CoV-2 RNA is generally detectable in upper and lower respiratory specimens during the acute phase of infection. The lowest concentration of SARS-CoV-2 viral copies this assay can detect is 250 copies / mL. A negative result does not preclude SARS-CoV-2 infection and should not be used as the sole basis for treatment or other patient management decisions.  A negative result may occur with improper specimen collection / handling, submission of specimen other than nasopharyngeal swab, presence of viral mutation(s) within the areas targeted by this assay, and inadequate number of viral copies (<250 copies / mL). A negative result must be combined with clinical observations, patient history, and epidemiological information.  Fact Sheet for Patients:   03/30/20  Fact Sheet for Healthcare Providers: BoilerBrush.com.cy  This test is not yet approved or  cleared by the https://pope.com/ FDA and has been authorized for detection and/or diagnosis of SARS-CoV-2 by FDA under an Emergency Use Authorization (EUA).  This EUA will remain in effect (meaning this test can be used) for the duration of the COVID-19 declaration under Section 564(b)(1) of the Act, 21 U.S.C. section 360bbb-3(b)(1), unless the authorization is terminated or revoked sooner.  Performed at [facility], [address] [place], [place]       Radiology Studies: No results found.   Scheduled  Meds: . aspirin EC  81 mg Oral Daily  .  atorvastatin  80 mg Oral Daily  . feeding supplement (GLUCERNA SHAKE)  237 mL Oral TID BM  . furosemide  40 mg Oral Daily  . heparin injection (subcutaneous)  5,000 Units Subcutaneous Q8H  . insulin aspart  0-5 Units Subcutaneous QHS  . insulin aspart  0-9 Units Subcutaneous TID WC  . insulin glargine  14 Units Subcutaneous Daily  . losartan  25 mg Oral Daily  . metoprolol tartrate  25 mg Oral BID  . multivitamin with minerals  1 tablet Oral Daily  . nicotine  21 mg Transdermal Daily  . polyethylene glycol  17 g Oral BID  . senna-docusate  1 tablet Oral BID  . sodium chloride flush  3 mL Intravenous Q12H  . spironolactone  12.5 mg Oral Daily   Continuous Infusions: . sodium chloride 10 mL/hr at 04/02/20 2340  . lactated ringers 10 mL/hr at 04/03/20 1201     LOS: 9 days   Time Spent in minutes   30 minutes  Scott Rios D.O. on 04/06/2020 at 2:01 PM  Between 7am to 7pm - Please see pager noted on [email or web address]  After 7pm go to [email or web address]  And look for the night coverage person covering for me after hours  [facility] Office  [phone]

## 2020-04-06 NOTE — Progress Notes (Signed)
PROGRESS NOTE    Scott Rios  JTT:[id number] DOB: [birth year] DOA: 03/27/2020 PCP: Scott Nevins, MD   Brief Narrative:  HPI on 03/28/2020 by Dr. Frankey Shown Scott Rios is a 64 y.o. male with medical history significant for hypertension and diabetes not on home medication, tobacco abuse and prior history of alcohol abuse (quit 10 years ago) who presents to the emergency department due to 59-month history of right leg weakness.  Patient states that the right leg sometimes go out on him.  He complained of difficulty in being able to stand after using the toilet last evening and he ended up crawling into the living room.  Right leg was numb and weak and with more difficulty in being able to raise the leg.  He complained of chronic constipation with last bowel movement pain 8 days ago, however there was no complaint of back pain, bowel or bladder changes.  Right hand was only significant for occasional tingling sensation.  He denies fever, chills, chest pain, shortness of breath or having any sick contact.  Interim history Patient was admitted with shortness of breath which improved with Lasix.  Also complained of leg weakness which is also improving but slowly.  Cardiology consulted and recommended CTA of the chest to rule out dissection and patient also underwent cardiac catheterization as well as TEE.  Patient was noted to have severe multivessel disease and significant aortic regurgitation require CABG as well as aortic valve replacement and possible mitral valve repair.  Cardiothoracic surgery consulted.  Dentistry also consulted, s/p multiple tooth extractions.  Assessment & Plan   Right leg weakness/back pain -Rule out structural lesion in the brain/lumbar spine/thiamine deficiency, CIDP -Appears to be improving -CT head showed no acute abnormality -CT lumbar spine without contrast showed no acute abnormality but did show multifactorial degenerative changes at L4-5 with resulting  severe spinal stenosis -Patient was placed on high-dose thiamine -TSH 1.92, repeated 2.465.   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  No acute osseous abnormality.  Chronic L4 compression fracture deformity with mild height loss.  Moderate to severe spinal canal narrowing at L4-5 level.  Moderate right L2-3, right L4-5 neural foraminal narrowing.  L3-4 disc bulge abutment of the existing L3, descending L4 nerve roots.  L5-S1 disc bulge abutment of the extending L5 and descending S1 nerve roots. 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  Mild ectasia of the ascending aorta measuring 3.7 cm in AP diameter.  Moderate bilateral pleural effusions with bibasilar atelectasis.  Patchy groundglass opacification of the mid to upper lungs may be due to infection inflammatory process.  1.1 cm right paratracheal lymph node likely reactive. -Echocardiogram showed an EF of 35 to 40%, grade 2 diastolic dysfunction along with significant AR and MR -Cardiology recommended right and left heart cath showing severe multivessel disease and severe aortic regurgitation and moderate to severe mitral regurgitation -TEE showed an EF of 35 to 40%.  No thrombus or mass noted.  Severe AI, moderate MR that is eccentric appears  partially due to annular dilation as appears to have a cleft/perforation adjacent to  P2. -Continue losartan, spironolactone, metoprolol -IV lasix discontinued on 8/22  NSTEMI -Likely secondary to the above, troponin peaked at 1747 and trended downward to 629 -No complaints of chest pain although patient did have shortness of breath -Patient with severe CAD as noted above along with uncontrolled diabetes mellitus, type II -Cardiothoracic surgery consulted and appreciated-feels patient may benefit from CABG/AVR and mitral valve repair- pending on 04/09/2020  Elevated D-dimer -Work-up as noted above -Lower extremity Dopplers negative for DVT  Severe aortic regurgitation/mitral regurgitation -Noted on TEE -Patient will benefit from replacement, cardiothoracic surgery already consulted  Dental caries -As above, dentistry consulted -s/p dental extraction on 04/03/2020  Diabetes mellitus, type II, uncontrolled with hyperglycemia -Hemoglobin A1c 10.2 -Please coordinator consulted and appreciated -Continue insulin sliding scale and CBG monitoring  Hypomagnesemia -Resolved with replacement, continue to monitor and supplement as needed  Hypoalbuminemia -Nutrition consulted and appreciated  Incidental pulmonary nodule/paratracheal lymph node -Noted on imaging noted above -Patient will need to follow-up outpatient  Tobacco abuse  -Smoking cessation -Continue nicotine patch  Hyponatremia -Despite being placed on IV fluids, sodium was down to 126 -Discontinued Lasix on 04/05/2020 -Sodium today is improved to 131 -Monitor BMP  Leukocytosis -Suspect reactive to recent surgery  Normocytic anemia -Stable hemoglobin currently 11.8, continue to monitor CBC  Acute anxiety -Resolved, suspect secondary from recent anesthesia -Patient returned from dental extraction on 04/03/2020 and felt that he could not feel his legs or move them and felt that his right upper extremity was shaking. -At bedside, patient appeared to be very anxious and was able to be calmed down.  Was  able to move all 4 extremities. -CT head no acute finding.  Minimal small vessel change of the hemispheric deep white matter  DVT Prophylaxis  heparin  Code Status: Full  Family Communication:  None at bedside  Disposition Plan:  Status is: Inpatient  Remains inpatient appropriate because:Ongoing diagnostic testing needed not appropriate for outpatient work up and Inpatient level of care appropriate due to severity of illness   Dispo: The patient is from: Home              Anticipated d/c is to: Home w/ [facility]              Anticipated d/c date is: > 3 days              Patient currently is not medically stable to d/c.  Consultants Cardiology Neurology Cardiothoracic surgery Dentistry  Procedures  Echocardiogram TEE Right and left heart catheterization Lower extremity Doppler Multiple extraction of tooth numbers 11, 22, 23, 26, 27, 28, and 29; Three Quadrants of alveoloplasty; Bilateral mandibular lingual tori reductions  Antibiotics   Anti-infectives (From admission, onward)   Start     Dose/Rate Route Frequency Ordered Stop   04/02/20 0400  vancomycin (VANCOREADY) IVPB 1250 mg/250 mL  Status:  Discontinued        1,250 mg 166.7 mL/hr over 90 Minutes Intravenous To Surgery 04/01/20 1045 04/01/20 1142   04/02/20 0400  levofloxacin (LEVAQUIN) IVPB 500 mg  Status:  Discontinued        500 mg 100 mL/hr over 60 Minutes Intravenous To Surgery 04/01/20 1045 04/01/20 1142   04/02/20 0000  clindamycin (CLEOCIN) IVPB 600 mg        600 mg 100 mL/hr over 30 Minutes Intravenous  Once 04/01/20 1850 04/03/20 0815      Subjective:  Scott Rios seen and examined today.   Patient feels that his leg swelling has improved today.  Was able to get up and walk around yesterday and plans to do the same today.  Currently denies chest pain, shortness of breath, abdominal pain, nausea or vomiting, diarrhea or constipation.  Would like to get his surgery done over with.  .    Objective:    Vitals:   04/05/20 2144 04/05/20 2357 04/06/20 0605 04/06/20 0745  BP: (!) 134/59 134/71 121/61 117/60  Pulse: 85 85 73 69  Resp:  17 18 18   Temp:  98.5 F (36.9 C) 97.8 F (36.6 C) 98.5 F (36.9 C)  TempSrc:  Oral Oral Oral  SpO2:  99% 95% 99%  Weight:   64.9 kg   Height:        Intake/Output Summary (Last 24 hours) at 04/06/2020 1344 Last data filed at 04/05/2020 2149 Gross per 24 hour  Intake 363 ml  Output 500 ml  Net -137 ml   Filed Weights   04/04/20 0422 04/05/20 0333 04/06/20 0605  Weight: 66.7 kg 66.6 kg 64.9 kg   Exam  General: Well developed, chronically ill-appearing, NAD  HEENT: NCAT, mucous membranes moist.  Edentulous  Cardiovascular: S1 S2 auscultated, RRR, no murmur  Respiratory: Clear to auscultation bilaterally   Abdomen: Soft, nontender, nondistended, + bowel sounds  Extremities: warm dry without cyanosis clubbing. B/L LE edema  Neuro: AAOx3, nonfocal  Psych: appropriate mood and affect, pleasant  Data Reviewed: I have personally reviewed following labs and imaging studies  CBC: Recent Labs  Lab 03/31/20 0341 03/31/20 0341 04/01/20 0647 04/02/20 0430 04/03/20 0435 04/04/20 0210 04/05/20 0502  WBC 11.1*   < > 10.6* 11.0* 10.8* 13.6* 11.4*  NEUTROABS 7.3  --  7.2  --   --   --   --   HGB 12.8*   < > 12.2* 12.6* 12.7* 11.9* 11.8*  HCT 37.3*   < > 36.9* 37.8* 36.9* 35.6* 34.6*  MCV 86.5   < > 86.4 86.9 86.6 86.4 86.5  PLT 355   < > 331 344 351 312 310   < > = values in this interval not displayed.   Basic Metabolic Panel: Recent Labs  Lab 03/31/20 0341 03/31/20 0341 04/01/20 0647 04/01/20 0647 04/02/20 0430 04/03/20 0435 04/04/20 0210 04/05/20 0502 04/06/20 0613  NA 132*   < > 130*   < > 131* 127* 126* 126* 131*  K 4.5   < > 4.0   < > 4.4 4.3 3.9 4.5 4.6  CL 93*   < > 93*   < > 91* 89* 89* 90* 95*  CO2 29   < > 30   < > 31 30 27 26 25   GLUCOSE 88   < > 170*   < > 154* 206* 172* 120* 94  BUN 15   < > 15   < > 15 15 13  10 10   CREATININE 0.74   < > 0.74   < > 0.74 0.70 0.75 0.64 0.73  CALCIUM 9.3   < > 8.7*   < > 9.3 8.9 9.0 9.0 9.2  MG 1.8  --  1.8  --   --   --   --   --   --   PHOS 5.6*  --  4.2  --   --   --   --   --   --    < > = values in this interval not  displayed.   GFR: Estimated Creatinine Clearance: 84.5 mL/min (by C-G formula based on SCr of 0.73 mg/dL). Liver Function Tests: Recent Labs  Lab 03/31/20 0341 04/01/20 0647  AST 18 14*  ALT 16 15  ALKPHOS 69 73  BILITOT 0.7 0.3  PROT 5.9* 5.4*  ALBUMIN 3.1* 2.8*   No results for input(s): LIPASE, AMYLASE in the last 168 hours. No results for input(s): AMMONIA in the last 168 hours. Coagulation Profile: No results for input(s): INR, PROTIME in the last 168 hours. Cardiac Enzymes: No results for input(s): CKTOTAL, CKMB, CKMBINDEX, TROPONINI in the last 168 hours. BNP (last 3 results) No results for input(s): PROBNP in the last 8760 hours. HbA1C: No results for input(s): HGBA1C in the last 72 hours. CBG: Recent Labs  Lab 04/05/20 1143 04/05/20 1639 04/05/20 2138 04/06/20 0738 04/06/20 1202  GLUCAP 84 259* 208* 92 181*   Lipid Profile: No results for input(s): CHOL, HDL, LDLCALC, TRIG, CHOLHDL, LDLDIRECT in the last 72 hours. Thyroid Function Tests: No results for input(s): TSH, T4TOTAL, FREET4, T3FREE, THYROIDAB in the last 72 hours. Anemia Panel: No results for input(s): VITAMINB12, FOLATE, FERRITIN, TIBC, IRON, RETICCTPCT in the last 72 hours. Urine analysis:    Component Value Date/Time   COLORURINE YELLOW 09/29/2012 1710   APPEARANCEUR CLEAR 09/29/2012 1710   LABSPEC >1.030 (H) 09/29/2012 1710   PHURINE 5.5 09/29/2012 1710   GLUCOSEU >1000 (A) 09/29/2012 1710   HGBUR TRACE (A) 09/29/2012 1710   BILIRUBINUR NEGATIVE 09/29/2012 1710   KETONESUR 15 (A) 09/29/2012 1710   PROTEINUR NEGATIVE 09/29/2012 1710   UROBILINOGEN 0.2 09/29/2012 1710   NITRITE NEGATIVE 09/29/2012 1710   LEUKOCYTESUR NEGATIVE 09/29/2012 1710    Sepsis Labs: @LABRCNTIP (procalcitonin:4,lacticidven:4)  ) Recent Results (from the past 240 hour(s))  SARS Coronavirus 2 by RT PCR (hospital order, [facility]) Nasopharyngeal Nasopharyngeal Swab     Status: None   Collection Time: 03/28/20  2:20 AM   Specimen: Nasopharyngeal Swab  Result Value Ref Range Status   SARS Coronavirus 2 NEGATIVE NEGATIVE Final    Comment: (NOTE) SARS-CoV-2 target nucleic acids are NOT DETECTED.  The SARS-CoV-2 RNA is generally detectable in upper and lower respiratory specimens during the acute phase of infection. The lowest concentration of SARS-CoV-2 viral copies this assay can detect is 250 copies / mL. A negative result does not preclude SARS-CoV-2 infection and should not be used as the sole basis for treatment or other patient management decisions.  A negative result may occur with improper specimen collection / handling, submission of specimen other than nasopharyngeal swab, presence of viral mutation(s) within the areas targeted by this assay, and inadequate number of viral copies (<250 copies / mL). A negative result must be combined with clinical observations, patient history, and epidemiological information.  Fact Sheet for Patients:   BoilerBrush.com.cyhttps://www.fda.gov/media/136312/download  Fact Sheet for Healthcare Providers: https://pope.com/https://www.fda.gov/media/136313/download  This test is not yet approved or  cleared by the [place] FDA and has been authorized for detection and/or diagnosis of SARS-CoV-2 by FDA under an Emergency Use Authorization (EUA).  This EUA will remain in effect (meaning this test can be used) for the duration of the COVID-19 declaration under Section 564(b)(1) of the Act, 21 U.S.C. section 360bbb-3(b)(1), unless the authorization is terminated or revoked sooner.  Performed at [facility], [address] [place], [place] [address]       Radiology Studies: No results found.   Scheduled  Meds: . aspirin EC  81 mg Oral  Daily  . atorvastatin  80 mg Oral Daily  . feeding supplement (GLUCERNA SHAKE)  237 mL Oral TID BM  . furosemide  40 mg Oral Daily  . heparin injection (subcutaneous)  5,000 Units Subcutaneous Q8H  . insulin aspart  0-5 Units Subcutaneous QHS  . insulin aspart  0-9 Units Subcutaneous TID WC  . insulin glargine  14 Units Subcutaneous Daily  . losartan  25 mg Oral Daily  . metoprolol tartrate  25 mg Oral BID  . multivitamin with minerals  1 tablet Oral Daily  . nicotine  21 mg Transdermal Daily  . polyethylene glycol  17 g Oral BID  . senna-docusate  1 tablet Oral BID  . sodium chloride flush  3 mL Intravenous Q12H  . spironolactone  12.5 mg Oral Daily   Continuous Infusions: . sodium chloride 10 mL/hr at 04/02/20 2340  . lactated ringers 10 mL/hr at 04/03/20 1201     LOS: 9 days   Time Spent in minutes   30 minutes  Scott Rios D.O. on 04/06/2020 at 1:44 PM  Between 7am to 7pm - Please see pager noted on [email or web address]  After 7pm go to [email or web address]  And look for the night coverage person covering for me after hours  [facility] Office  [phone]

## 2020-04-07 DIAGNOSIS — I2510 Atherosclerotic heart disease of native coronary artery without angina pectoris: Secondary | ICD-10-CM

## 2020-04-07 LAB — HEMOGLOBIN AND HEMATOCRIT, BLOOD
HCT: 37.4 % — ABNORMAL LOW (ref 39.0–52.0)
Hemoglobin: 12.6 g/dL — ABNORMAL LOW (ref 13.0–17.0)

## 2020-04-07 LAB — BASIC METABOLIC PANEL
Anion gap: 8 (ref 5–15)
BUN: 12 mg/dL (ref 8–23)
CO2: 26 mmol/L (ref 22–32)
Calcium: 9.1 mg/dL (ref 8.9–10.3)
Chloride: 93 mmol/L — ABNORMAL LOW (ref 98–111)
Creatinine, Ser: 0.72 mg/dL (ref 0.61–1.24)
GFR calc Af Amer: >60 mL/min (ref >60)
GFR calc non Af Amer: >60 mL/min (ref >60)
Glucose, Bld: 119 mg/dL — ABNORMAL HIGH (ref 70–99)
Potassium: 4.7 mmol/L (ref 3.5–5.1)
Sodium: 127 mmol/L — ABNORMAL LOW (ref 135–145)

## 2020-04-07 LAB — GLUCOSE, CAPILLARY
Glucose-Capillary: 113 mg/dL — ABNORMAL HIGH (ref 70–99)
Glucose-Capillary: 212 mg/dL — ABNORMAL HIGH (ref 70–99)
Glucose-Capillary: 185 mg/dL — ABNORMAL HIGH (ref 70–99)
Glucose-Capillary: 250 mg/dL — ABNORMAL HIGH (ref 70–99)

## 2020-04-07 LAB — OSMOLALITY, URINE: Osmolality, Ur: 263 mOsm/kg — ABNORMAL LOW (ref 300–900)

## 2020-04-07 LAB — OSMOLALITY: Osmolality: 265 mOsm/kg — ABNORMAL LOW (ref 275–295)

## 2020-04-07 LAB — SODIUM, URINE, RANDOM: Sodium, Ur: 52 mmol/L

## 2020-04-07 LAB — CREATININE, URINE, RANDOM: Creatinine, Urine: 34.56 mg/dL

## 2020-04-07 NOTE — Progress Notes (Signed)
4 Days Post-Op Procedure(s) (LRB): Extraction of tooth #'s 11,22,23,26,27,28,29 with alveoloplasty and bilateral mandibular tori reductions (N/A) Subjective: Says his mouth feels fine. Eating soft foods. No chest pain or shortness of breath.  Objective: Vital signs in last 24 hours: Temp:  [97.6 F (36.4 C)-98.7 F (37.1 C)] 97.8 F (36.6 C) (08/24 1200) Pulse Rate:  [61-83] 61 (08/24 1200) Cardiac Rhythm: Normal sinus rhythm;Heart block (08/24 0900) Resp:  [17-18] 17 (08/24 0351) BP: (118-150)/(60-86) 118/60 (08/24 1200) SpO2:  [95 %-97 %] 95 % (08/24 1200) Weight:  [66.5 kg] 66.5 kg (08/24 0351)  Hemodynamic parameters for last 24 hours:    Intake/Output from previous day: 08/23 0701 - 08/24 0700 In: 363 [P.O.:360; I.V.:3] Out: 2820 [Urine:2820] Intake/Output this shift: Total I/O In: 363 [P.O.:360; I.V.:3] Out: 1575 [Urine:1575]  General appearance: alert and cooperative Neurologic: intact Heart: regular rate and rhythm, S1, S2 normal, 2/6  Systolic murmur Lungs: clear to auscultation bilaterally Extremities: edema mild in lower legs.   Lab Results: Recent Labs    04/05/20 0502 04/07/20 0408  WBC 11.4*  --   HGB 11.8* 12.6*  HCT 34.6* 37.4*  PLT 310  --    BMET:  Recent Labs    04/06/20 0613 04/07/20 0408  NA 131* 127*  K 4.6 4.7  CL 95* 93*  CO2 25 26  GLUCOSE 94 119*  BUN 10 12  CREATININE 0.73 0.72  CALCIUM 9.2 9.1    PT/INR: No results for input(s): LABPROT, INR in the last 72 hours. ABG    Component Value Date/Time   PHART 7.427 03/30/2020 1459   HCO3 28.9 (H) 03/30/2020 1500   TCO2 30 03/30/2020 1500   O2SAT 57.0 03/30/2020 1500   CBG (last 3)  Recent Labs    04/06/20 2050 04/07/20 0817 04/07/20 1129  GLUCAP 311* 113* 212*    Assessment/Plan: Severe multivessel CAD and severe AI, moderate MR and moderate LV systolic dysfunction.  S/P Procedure(s) (LRB): Extraction of tooth #'s 11,22,23,26,27,28,29 with alveoloplasty and  bilateral mandibular tori reductions (N/A)  He continues to do well with tune up and extractions.   Plan Surgery on Thursday.    LOS: 10 days    Alleen Borne 04/07/2020

## 2020-04-07 NOTE — Progress Notes (Signed)
Progress Note  Patient Name: Scott Rios Date of Encounter: 04/07/2020  [facility] Cardiologist: No primary care provider on file.   Subjective   Denies any chest pain or dyspnea.  Sodium worsened to 127, was net negative 2.5L yesterday.  Inpatient Medications    Scheduled Meds: . aspirin EC  81 mg Oral Daily  . atorvastatin  80 mg Oral Daily  . feeding supplement (GLUCERNA SHAKE)  237 mL Oral TID BM  . furosemide  40 mg Oral Daily  . heparin injection (subcutaneous)  5,000 Units Subcutaneous Q8H  . insulin aspart  0-5 Units Subcutaneous QHS  . insulin aspart  0-9 Units Subcutaneous TID WC  . insulin glargine  14 Units Subcutaneous Daily  . losartan  25 mg Oral Daily  . metoprolol tartrate  25 mg Oral BID  . multivitamin with minerals  1 tablet Oral Daily  . nicotine  21 mg Transdermal Daily  . polyethylene glycol  17 g Oral BID  . senna-docusate  1 tablet Oral BID  . sodium chloride flush  3 mL Intravenous Q12H  . spironolactone  12.5 mg Oral Daily   Continuous Infusions: . sodium chloride 10 mL/hr at 04/02/20 2340  . lactated ringers Stopped (04/03/20 1900)   PRN Meds: sodium chloride, acetaminophen, diphenhydrAMINE, HYDROcodone-acetaminophen, ondansetron (ZOFRAN) IV, sodium chloride flush   Vital Signs    Vitals:   04/06/20 2135 04/06/20 2345 04/07/20 0351 04/07/20 0800  BP: (!) 150/66 (!) 148/86 126/69 129/64  Pulse: 83 76 75 72  Resp:  18 17   Temp:  98 F (36.7 C) 98.7 F (37.1 C) 97.6 F (36.4 C)  TempSrc:  Oral Oral Oral  SpO2:  97% 95% 97%  Weight:   66.5 kg   Height:   5\' 7"  (1.702 m)     Intake/Output Summary (Last 24 hours) at 04/07/2020 1200 Last data filed at 04/07/2020 1143 Gross per 24 hour  Intake 606 ml  Output 3095 ml  Net -2489 ml   Last 3 Weights 04/07/2020 04/06/2020 04/05/2020  Weight (lbs) 146 lb 11.2 oz 143 lb 146 lb 14.3 oz  Weight (kg) 66.543 kg 64.864 kg 66.63 kg      Telemetry    NSR rate 80s Personally  Reviewed  Physical Exam   GEN: Well nourished, well developed, in no acute distress  HEENT: normal  Neck: no JVD, carotid bruits, or masses Cardiac: RRR; no murmurs, rubs, or gallops,no edema  Respiratory:  clear to auscultation bilaterally, normal work of breathing GI: soft, nontender, nondistended, + BS MS: no deformity or atrophy  Skin: warm and dry, no rash Neuro:  Alert and Oriented x 3, Strength and sensation are intact Psych: euthymic mood, full affect    Labs    High Sensitivity Troponin:   Recent Labs  Lab 03/27/20 2317 03/28/20 0324 03/28/20 0819 03/28/20 1006 03/29/20 0941  TROPONINIHS 27* 121* 1,479* 1,747* 629*      Chemistry Recent Labs  Lab 04/01/20 0647 04/02/20 0430 04/05/20 0502 04/06/20 0613 04/07/20 0408  NA 130*   < > 126* 131* 127*  K 4.0   < > 4.5 4.6 4.7  CL 93*   < > 90* 95* 93*  CO2 30   < > 26 25 26   GLUCOSE 170*   < > 120* 94 119*  BUN 15   < > 10 10 12   CREATININE 0.74   < > 0.64 0.73 0.72  CALCIUM 8.7*   < > 9.0 9.2  9.1  PROT 5.4*  --   --   --   --   ALBUMIN 2.8*  --   --   --   --   AST 14*  --   --   --   --   ALT 15  --   --   --   --   ALKPHOS 73  --   --   --   --   BILITOT 0.3  --   --   --   --   GFRNONAA >60   < > >60 >60 >60  GFRAA >60   < > >60 >60 >60  ANIONGAP 7   < > 10 11 8    < > = values in this interval not displayed.     Hematology Recent Labs  Lab 04/03/20 0435 04/03/20 0435 04/04/20 0210 04/05/20 0502 04/07/20 0408  WBC 10.8*  --  13.6* 11.4*  --   RBC 4.26  --  4.12* 4.00*  --   HGB 12.7*   < > 11.9* 11.8* 12.6*  HCT 36.9*   < > 35.6* 34.6* 37.4*  MCV 86.6  --  86.4 86.5  --   MCH 29.8  --  28.9 29.5  --   MCHC 34.4  --  33.4 34.1  --   RDW 13.4  --  13.2 13.2  --   PLT 351  --  312 310  --    < > = values in this interval not displayed.    BNPNo results for input(s): BNP, PROBNP in the last 168 hours.   DDimer  No results for input(s): DDIMER in the last 168 hours.   Radiology     No results found.  Cardiac Studies   Diagnostic Dominance: Right   TEE: Severe aortic insufficiency, moderate to severe mitral regurgitation, EF 35%  Patient Profile     64 y.o. male with severe aortic insufficiency moderate to severe mitral regurgitation EF 35% triple-vessel coronary artery disease awaiting CABG/aortic valve replacement/possible mitral valve repair  Assessment & Plan    CAD/non-STEMI/severe aortic insufficiency/moderate mitral regurgitation/ischemic cardiomyopathy/acute systolic heart failure/hyponatremia -Planning CABG/AVR/potential mitral repair with Dr 76 -Echo with EF 35 to 40% -Continue ASA, statin, metoprolol, losartan, spironolactone -Worsening hyponatremia, suspect due to diuresis.  Received PO lasix dose this AM, will hold for now.   For questions or updates, please contact [facility] Please consult [email or web address] for contact info under        Signed, Laneta Simmers, MD  04/07/2020, 12:00 PM

## 2020-04-07 NOTE — Progress Notes (Signed)
PROGRESS NOTE    Scott Rios  BJY:[id number] DOB: 29-Nov-1955 DOA: 03/27/2020 PCP: Elfredia NevinsFusco, Lawrence, MD   Brief Narrative:  HPI on 03/28/2020 by Dr. Frankey Shownladapo Adefeso Scott Rios is a 64 y.o. male with medical history significant for hypertension and diabetes not on home medication, tobacco abuse and prior history of alcohol abuse (quit 10 years ago) who presents to the emergency department due to 3261-month history of right leg weakness.  Patient states that the right leg sometimes go out on him.  He complained of difficulty in being able to stand after using the toilet last evening and he ended up crawling into the living room.  Right leg was numb and weak and with more difficulty in being able to raise the leg.  He complained of chronic constipation with last bowel movement pain 8 days ago, however there was no complaint of back pain, bowel or bladder changes.  Right hand was only significant for occasional tingling sensation.  He denies fever, chills, chest pain, shortness of breath or having any sick contact.  Interim history Patient was admitted with shortness of breath which improved with Lasix.  Also complained of leg weakness which is also improving but slowly.  Cardiology consulted and recommended CTA of the chest to rule out dissection and patient also underwent cardiac catheterization as well as TEE.  Patient was noted to have severe multivessel disease and significant aortic regurgitation require CABG as well as aortic valve replacement and possible mitral valve repair.  Cardiothoracic surgery consulted.  Dentistry also consulted, s/p multiple tooth extractions.  Pending CABG, AVR on 04/09/2020. Assessment & Plan   Right leg weakness/back pain -Rule out structural lesion in the brain/lumbar spine/thiamine deficiency, CIDP -Appears to be improving -CT head showed no acute abnormality -CT lumbar spine without contrast showed no acute abnormality but did show multifactorial degenerative  changes at L4-5 with resulting severe spinal stenosis -Patient was placed on high-dose thiamine -TSH 1.92, repeated 2.465.   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  No acute osseous abnormality.  Chronic L4 compression fracture deformity with mild height loss.  Moderate to severe spinal canal narrowing at L4-5 level.  Moderate right L2-3, right L4-5 neural foraminal narrowing.  L3-4 disc bulge abutment of the existing L3, descending L4 nerve roots.  L5-S1 disc bulge abutment of the extending L5 and descending S1 nerve roots. 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  Mild ectasia of the ascending aorta measuring 3.7 cm in AP diameter.  Moderate bilateral pleural effusions with bibasilar atelectasis.  Patchy groundglass opacification of the mid to upper lungs may be due to infection inflammatory process.  1.1 cm right paratracheal lymph node likely reactive. -Echocardiogram showed an EF of 35 to 40%, grade 2 diastolic dysfunction along with significant AR and MR -Cardiology recommended right and left heart cath showing severe multivessel disease and severe aortic regurgitation and moderate to severe mitral regurgitation -TEE showed an EF of 35 to 40%.  No thrombus or mass noted.  Severe AI, moderate  MR that is eccentric appears partially due to annular dilation as appears to have a  cleft/perforation adjacent to P2. -Continue losartan, spironolactone, metoprolol -IV lasix discontinued on 8/22, placed on oral Lasix  NSTEMI -Likely secondary to the above, troponin peaked at 1747 and trended downward to 629 -No complaints of chest pain although patient did have shortness of breath -Patient with severe CAD as noted above along with uncontrolled diabetes mellitus, type II -Cardiothoracic surgery consulted and appreciated-feels patient may benefit from CABG/AVR and mitral valve repair- pending on 04/09/2020  Elevated D-dimer -Work-up as noted above -Lower extremity Dopplers negative for DVT  Severe aortic regurgitation/mitral regurgitation -Noted on TEE -Patient will benefit from replacement, cardiothoracic surgery already consulted  Dental caries -As above, dentistry consulted -s/p dental extraction on 04/03/2020  Diabetes mellitus, type II, uncontrolled with hyperglycemia -Hemoglobin A1c 10.2 -Please coordinator consulted and appreciated -Continue insulin sliding scale and CBG monitoring  Hypomagnesemia -Resolved with replacement, continue to monitor and supplement as needed  Hypoalbuminemia -Nutrition consulted and appreciated  Incidental pulmonary nodule/paratracheal lymph node -Noted on imaging noted above -Patient will need to follow-up outpatient  Tobacco abuse  -Smoking cessation -Continue nicotine patch  Hyponatremia -Currently Asymptomatic -Despite being placed on IV fluids, sodium was down to 126 -Transitioned from IV Lasix to oral Lasix on 04/05/2020 -Sodium unfortunate today down to 127 -Urine electrolytes -Monitor BMP  Leukocytosis -Suspect reactive to recent surgery  Normocytic anemia -Stable hemoglobin currently 12.6, continue to monitor CBC  Acute anxiety -Resolved, suspect secondary from recent anesthesia -Patient returned from dental extraction on 04/03/2020 and felt that he could not feel his legs or move them and felt that his  right upper extremity was shaking. -At bedside, patient appeared to be very anxious and was able to be calmed down.  Was able to move all 4 extremities. -CT head no acute finding.  Minimal small vessel change of the hemispheric deep white matter  DVT Prophylaxis  heparin  Code Status: Full  Family Communication:  None at bedside  Disposition Plan:  Status is: Inpatient  Remains inpatient appropriate because:Ongoing diagnostic testing needed not appropriate for outpatient work up and Inpatient level of care appropriate due to severity of illness   Dispo: The patient is from: Home              Anticipated d/c is to: Home w/ [facility]              Anticipated d/c date is: > 3 days              Patient currently is not medically stable to d/c.  Consultants Cardiology Neurology Cardiothoracic surgery Dentistry  Procedures  Echocardiogram TEE Right and left heart catheterization Lower extremity Doppler Multiple extraction of tooth numbers 11, 22, 23, 26, 27, 28, and 29; Three Quadrants of alveoloplasty; Bilateral mandibular lingual tori reductions  Antibiotics   Anti-infectives (From admission, onward)   Start     Dose/Rate Route Frequency Ordered Stop   04/02/20 0400  vancomycin (VANCOREADY) IVPB 1250 mg/250 mL  Status:  Discontinued        1,250 mg 166.7 mL/hr over 90 Minutes Intravenous To Surgery 04/01/20 1045 04/01/20 1142   04/02/20 0400  levofloxacin (LEVAQUIN) IVPB 500 mg  Status:  Discontinued        500 mg 100 mL/hr over 60 Minutes Intravenous To Surgery 04/01/20 1045 04/01/20 1142   04/02/20 0000  clindamycin (CLEOCIN) IVPB 600 mg        600 mg 100  mL/hr over 30 Minutes Intravenous  Once 04/01/20 1850 04/03/20 0815      Subjective:   Scott Rios seen and examined today.  Patient no longer complains of leg pain and is able to walk around.  He is anxious about his upcoming surgery.  He denies chest pain, shortness of breath, abdominal pain, nausea or vomiting, diarrhea  or constipation, dizziness or headache.    Objective:   Vitals:   04/06/20 2135 04/06/20 2345 04/07/20 0351 04/07/20 0800  BP: (!) 150/66 (!) 148/86 126/69 129/64  Pulse: 83 76 75 72  Resp:  18 17   Temp:  98 F (36.7 C) 98.7 F (37.1 C) 97.6 F (36.4 C)  TempSrc:  Oral Oral Oral  SpO2:  97% 95% 97%  Weight:   66.5 kg   Height:   5\' 7"  (1.702 m)     Intake/Output Summary (Last 24 hours) at 04/07/2020 1121 Last data filed at 04/07/2020 1100 Gross per 24 hour  Intake 606 ml  Output 2820 ml  Net -2214 ml   Filed Weights   04/05/20 0333 04/06/20 0605 04/07/20 0351  Weight: 66.6 kg 64.9 kg 66.5 kg   Exam  General: Well developed, chronically ill-appearing, NAD  HEENT: NCAT,  mucous membranes moist.  Edentulous  Cardiovascular: S1 S2 auscultated, RRR  Respiratory: Clear to auscultation bilaterally, no wheezing  Abdomen: Soft, nontender, nondistended, + bowel sounds  Extremities: warm dry without cyanosis clubbing. Trace LE edema  Neuro: AAOx3, nonfocal  Psych: anxious however appropriate   Data Reviewed: I have personally reviewed following labs and imaging studies  CBC: Recent Labs  Lab 04/01/20 0647 04/01/20 0647 04/02/20 0430 04/03/20 0435 04/04/20 0210 04/05/20 0502 04/07/20 0408  WBC 10.6*  --  11.0* 10.8* 13.6* 11.4*  --   NEUTROABS 7.2  --   --   --   --   --   --   HGB 12.2*   < > 12.6* 12.7* 11.9* 11.8* 12.6*  HCT 36.9*   < > 37.8* 36.9* 35.6* 34.6* 37.4*  MCV 86.4  --  86.9 86.6 86.4 86.5  --   PLT 331  --  344 351 312 310  --    < > = values in this interval not displayed.   Basic Metabolic Panel: Recent Labs  Lab 04/01/20 0647 04/02/20 0430 04/03/20 0435 04/04/20 0210 04/05/20 0502 04/06/20 0613 04/07/20 0408  NA 130*   < > 127* 126* 126* 131* 127*  K 4.0   < > 4.3 3.9 4.5 4.6 4.7  CL 93*   < > 89* 89* 90* 95* 93*  CO2 30   < > 30 27 26 25 26   GLUCOSE 170*   < > 206* 172* 120* 94 119*  BUN 15   < > 15 13 10 10 12   CREATININE  0.74   < > 0.70 0.75 0.64 0.73 0.72  CALCIUM 8.7*   < > 8.9 9.0 9.0 9.2 9.1  MG 1.8  --   --   --   --   --   --   PHOS 4.2  --   --   --   --   --   --    < > = values in this interval not displayed.   GFR: Estimated Creatinine Clearance: 86.1 mL/min (by C-G formula based on SCr of 0.72 mg/dL). Liver Function Tests: Recent Labs  Lab 04/01/20 0647  AST 14*  ALT 15  ALKPHOS 73  BILITOT 0.3  PROT 5.4*  ALBUMIN 2.8*   No results for input(s): LIPASE, AMYLASE in the last 168 hours. No results for input(s): AMMONIA in the last 168 hours. Coagulation Profile: No results for input(s): INR, PROTIME in the last 168 hours. Cardiac Enzymes: No results for input(s): CKTOTAL, CKMB, CKMBINDEX, TROPONINI in the last 168 hours. BNP (last 3 results) No results for input(s): PROBNP in the last 8760 hours. HbA1C: No results for input(s): HGBA1C in the last 72 hours. CBG: Recent Labs  Lab 04/06/20 0738 04/06/20 1202 04/06/20 1650 04/06/20 2050 04/07/20 0817  GLUCAP 92 181* 178* 311* 113*   Lipid Profile: No results for input(s): CHOL, HDL, LDLCALC, TRIG, CHOLHDL, LDLDIRECT in the last 72 hours. Thyroid Function Tests: No results for input(s): TSH, T4TOTAL, FREET4, T3FREE, THYROIDAB in the last 72 hours. Anemia Panel: No results for input(s): VITAMINB12, FOLATE, FERRITIN, TIBC, IRON, RETICCTPCT in the last 72 hours. Urine analysis:    Component Value Date/Time   COLORURINE YELLOW 09/29/2012 1710   APPEARANCEUR CLEAR 09/29/2012 1710   LABSPEC >1.030 (H) 09/29/2012 1710   PHURINE 5.5 09/29/2012 1710   GLUCOSEU >1000 (A) 09/29/2012 1710   HGBUR TRACE (A) 09/29/2012 1710   BILIRUBINUR NEGATIVE 09/29/2012 1710   KETONESUR 15 (A) 09/29/2012 1710   PROTEINUR NEGATIVE 09/29/2012 1710   UROBILINOGEN 0.2 09/29/2012 1710   NITRITE NEGATIVE 09/29/2012 1710   LEUKOCYTESUR NEGATIVE 09/29/2012 1710   Sepsis Labs: @LABRCNTIP (procalcitonin:4,lacticidven:4)  ) No results found for this or  any previous visit (from the past 240 hour(s)).    Radiology Studies: No results found.   Scheduled Meds: . aspirin EC  81 mg Oral Daily  . atorvastatin  80 mg Oral Daily  . feeding supplement (GLUCERNA SHAKE)  237 mL Oral TID BM  . furosemide  40 mg Oral Daily  . heparin injection (subcutaneous)  5,000 Units Subcutaneous Q8H  . insulin aspart  0-5 Units Subcutaneous QHS  . insulin aspart  0-9 Units Subcutaneous TID WC  . insulin glargine  14 Units Subcutaneous Daily  . losartan  25 mg Oral Daily  . metoprolol tartrate  25 mg Oral BID  . multivitamin with minerals  1 tablet Oral Daily  . nicotine  21 mg Transdermal Daily  . polyethylene glycol  17 g Oral BID  . senna-docusate  1 tablet Oral BID  . sodium chloride flush  3 mL Intravenous Q12H  . spironolactone  12.5 mg Oral Daily   Continuous Infusions: . sodium chloride 10 mL/hr at 04/02/20 2340  . lactated ringers Stopped (04/03/20 1900)     LOS: 10 days   Time Spent in minutes   30 minutes  Tajae Rybicki D.O. on 04/07/2020 at 11:21 AM  Between 7am to 7pm - Please see pager noted on [email or web address]  After 7pm go to [email or web address]  And look for the night coverage person covering for me after hours  [facility] Office  [phone]

## 2020-04-07 NOTE — TOC Progression Note (Signed)
Transition of Care [facility]) - Progression Note    Patient Details  Name: DAYSEAN TINKHAM MRN: [id number] Date of Birth: [birth year]  Transition of Care [facility]) CM/SW Contact  Graves-Bigelow, Lamar Laundry, RN Phone Number: 04/07/2020, 3:25 PM  Clinical Narrative: Case Manager spoke with patient regarding consult for assistance with finding a podiatrist that accepts Medicare and a primary care provider. Per patient, it has been years since patient has seen Dr. Sherwood Gambler in [facility]. Patient is aware to call the 1-800 number on his Medicare card to find podiatrist in network and the son is aware to call Dr. Sharyon Medicus office to see if he accepts Medicare patients.   Expected Discharge Plan: Home w Home Health Services Barriers to Discharge: Continued Medical Work up  Expected Discharge Plan and Services Expected Discharge Plan: Home w Home Health Services   Discharge Planning Services: CM Consult Post Acute Care Choice: Durable Medical Equipment, Home Health Living arrangements for the past 2 months: Single Family Home                 DME Arranged: 3-N-1, Walker rolling DME Agency: [employer] Date DME Agency Contacted: 03/30/20 Time DME Agency Contacted: [phone] Representative spoke with at DME Agency: Oletha Cruel HH Arranged: PT, OT HH Agency: [facility] Date [facility] Agency Contacted: 03/30/20 Time HH Agency Contacted: [phone] Representative spoke with at [facility] Agency: Lorenza Chick  Readmission Risk Interventions No flowsheet data found.

## 2020-04-08 DIAGNOSIS — Z01818 Encounter for other preprocedural examination: Secondary | ICD-10-CM

## 2020-04-08 DIAGNOSIS — R0600 Dyspnea, unspecified: Secondary | ICD-10-CM

## 2020-04-08 LAB — BASIC METABOLIC PANEL
Anion gap: 10 (ref 5–15)
BUN: 11 mg/dL (ref 8–23)
CO2: 26 mmol/L (ref 22–32)
Calcium: 9 mg/dL (ref 8.9–10.3)
Chloride: 89 mmol/L — ABNORMAL LOW (ref 98–111)
Creatinine, Ser: 0.57 mg/dL — ABNORMAL LOW (ref 0.61–1.24)
GFR calc Af Amer: >60 mL/min (ref >60)
GFR calc non Af Amer: >60 mL/min (ref >60)
Glucose, Bld: 223 mg/dL — ABNORMAL HIGH (ref 70–99)
Potassium: 4.8 mmol/L (ref 3.5–5.1)
Sodium: 125 mmol/L — ABNORMAL LOW (ref 135–145)

## 2020-04-08 LAB — ABO/RH: ABO/RH(D): O POS

## 2020-04-08 LAB — CBC
HCT: 37.5 % — ABNORMAL LOW (ref 39.0–52.0)
Hemoglobin: 12.8 g/dL — ABNORMAL LOW (ref 13.0–17.0)
MCH: 29.5 pg (ref 26.0–34.0)
MCHC: 34.1 g/dL (ref 30.0–36.0)
MCV: 86.4 fL (ref 80.0–100.0)
Platelets: 412 10*3/uL — ABNORMAL HIGH (ref 150–400)
RBC: 4.34 MIL/uL (ref 4.22–5.81)
RDW: 13.4 % (ref 11.5–15.5)
WBC: 11 10*3/uL — ABNORMAL HIGH (ref 4.0–10.5)
nRBC: 0 % (ref 0.0–0.2)

## 2020-04-08 LAB — GLUCOSE, CAPILLARY
Glucose-Capillary: 161 mg/dL — ABNORMAL HIGH (ref 70–99)
Glucose-Capillary: 174 mg/dL — ABNORMAL HIGH (ref 70–99)
Glucose-Capillary: 207 mg/dL — ABNORMAL HIGH (ref 70–99)
Glucose-Capillary: 200 mg/dL — ABNORMAL HIGH (ref 70–99)

## 2020-04-08 LAB — MAGNESIUM: Magnesium: 1.6 mg/dL — ABNORMAL LOW (ref 1.7–2.4)

## 2020-04-08 MED ORDER — LEVOFLOXACIN IN D5W 500 MG/100ML IV SOLN
500.0000 mg | INTRAVENOUS | Status: AC
  Administered 2020-04-09: 500 mg via INTRAVENOUS
  Filled 2020-04-08: qty 100

## 2020-04-08 MED ORDER — PLASMA-LYTE 148 IV SOLN
INTRAVENOUS | Status: DC
  Filled 2020-04-08: qty 2.5

## 2020-04-08 MED ORDER — POTASSIUM CHLORIDE 2 MEQ/ML IV SOLN
80.0000 meq | INTRAVENOUS | Status: DC
  Filled 2020-04-08: qty 40

## 2020-04-08 MED ORDER — MAGNESIUM SULFATE 2 GM/50ML IV SOLN
2.0000 g | Freq: Once | INTRAVENOUS | Status: AC
  Administered 2020-04-08: 2 g via INTRAVENOUS
  Filled 2020-04-08: qty 50

## 2020-04-08 MED ORDER — CHLORHEXIDINE GLUCONATE CLOTH 2 % EX PADS
6.0000 | MEDICATED_PAD | Freq: Once | CUTANEOUS | Status: AC
  Administered 2020-04-09: 6 via TOPICAL

## 2020-04-08 MED ORDER — VANCOMYCIN HCL 1250 MG/250ML IV SOLN
1250.0000 mg | INTRAVENOUS | Status: AC
  Administered 2020-04-09: 1250 mg via INTRAVENOUS
  Filled 2020-04-08: qty 250

## 2020-04-08 MED ORDER — NITROGLYCERIN IN D5W 200-5 MCG/ML-% IV SOLN
2.0000 ug/min | INTRAVENOUS | Status: DC
  Filled 2020-04-08: qty 250

## 2020-04-08 MED ORDER — TRANEXAMIC ACID (OHS) BOLUS VIA INFUSION
15.0000 mg/kg | INTRAVENOUS | Status: AC
  Administered 2020-04-09: 997.5 mg via INTRAVENOUS
  Filled 2020-04-08: qty 998

## 2020-04-08 MED ORDER — SODIUM CHLORIDE 0.9 % IV SOLN
INTRAVENOUS | Status: DC
  Filled 2020-04-08: qty 30

## 2020-04-08 MED ORDER — NOREPINEPHRINE 4 MG/250ML-% IV SOLN
0.0000 ug/min | INTRAVENOUS | Status: DC
  Filled 2020-04-08: qty 250

## 2020-04-08 MED ORDER — INSULIN REGULAR(HUMAN) IN NACL 100-0.9 UT/100ML-% IV SOLN
INTRAVENOUS | Status: AC
  Administered 2020-04-09: 2.8 [IU]/h via INTRAVENOUS
  Filled 2020-04-08: qty 100

## 2020-04-08 MED ORDER — MILRINONE LACTATE IN DEXTROSE 20-5 MG/100ML-% IV SOLN
0.3000 ug/kg/min | INTRAVENOUS | Status: AC
  Administered 2020-04-09: .125 ug/kg/min via INTRAVENOUS
  Filled 2020-04-08: qty 100

## 2020-04-08 MED ORDER — TRANEXAMIC ACID (OHS) PUMP PRIME SOLUTION
2.0000 mg/kg | INTRAVENOUS | Status: DC
  Filled 2020-04-08: qty 1.33

## 2020-04-08 MED ORDER — BISACODYL 5 MG PO TBEC
5.0000 mg | DELAYED_RELEASE_TABLET | Freq: Once | ORAL | Status: AC
  Administered 2020-04-08: 5 mg via ORAL
  Filled 2020-04-08: qty 1

## 2020-04-08 MED ORDER — CHLORHEXIDINE GLUCONATE 0.12 % MT SOLN
15.0000 mL | Freq: Once | OROMUCOSAL | Status: AC
  Administered 2020-04-09: 15 mL via OROMUCOSAL
  Filled 2020-04-08: qty 15

## 2020-04-08 MED ORDER — DEXMEDETOMIDINE HCL IN NACL 400 MCG/100ML IV SOLN
0.1000 ug/kg/h | INTRAVENOUS | Status: AC
  Administered 2020-04-09: .5 ug/kg/h via INTRAVENOUS
  Filled 2020-04-08: qty 100

## 2020-04-08 MED ORDER — TEMAZEPAM 15 MG PO CAPS: 15.0000 mg | ORAL_CAPSULE | Freq: Once | ORAL | Status: DC | PRN

## 2020-04-08 MED ORDER — MAGNESIUM SULFATE 50 % IJ SOLN
40.0000 meq | INTRAMUSCULAR | Status: DC
  Filled 2020-04-08: qty 9.85

## 2020-04-08 MED ORDER — DIAZEPAM 5 MG PO TABS
5.0000 mg | ORAL_TABLET | Freq: Once | ORAL | Status: AC
  Administered 2020-04-09: 5 mg via ORAL
  Filled 2020-04-08: qty 1

## 2020-04-08 MED ORDER — PHENYLEPHRINE HCL-NACL 20-0.9 MG/250ML-% IV SOLN
30.0000 ug/min | INTRAVENOUS | Status: AC
  Administered 2020-04-09: 30 ug/min via INTRAVENOUS
  Filled 2020-04-08: qty 250

## 2020-04-08 MED ORDER — METOPROLOL TARTRATE 12.5 MG HALF TABLET
12.5000 mg | ORAL_TABLET | Freq: Once | ORAL | Status: AC
  Administered 2020-04-09: 12.5 mg via ORAL
  Filled 2020-04-08: qty 1

## 2020-04-08 MED ORDER — EPINEPHRINE HCL 5 MG/250ML IV SOLN IN NS
0.0000 ug/min | INTRAVENOUS | Status: AC
  Administered 2020-04-09: 2 ug/min via INTRAVENOUS
  Filled 2020-04-08: qty 250

## 2020-04-08 MED ORDER — CHLORHEXIDINE GLUCONATE CLOTH 2 % EX PADS
6.0000 | MEDICATED_PAD | Freq: Once | CUTANEOUS | Status: AC
  Administered 2020-04-08: 6 via TOPICAL

## 2020-04-08 MED ORDER — TRANEXAMIC ACID 1000 MG/10ML IV SOLN
1.5000 mg/kg/h | INTRAVENOUS | Status: AC
  Administered 2020-04-09: 1.5 mg/kg/h via INTRAVENOUS
  Filled 2020-04-08: qty 25

## 2020-04-08 NOTE — Progress Notes (Signed)
PROGRESS NOTE  Scott Rios KZS:[id number] DOB: 10/25/1955 DOA: 03/27/2020 PCP: Elfredia Nevins, MD   LOS: 11 days   Brief narrative: As per HPI,  HPI on 03/28/2020 by Dr. Tana Conch a 64 y.o.malewith past medical history significant forhypertension and diabetes, not on home medication, tobacco abuse and prior history of alcohol abuse (quit 10 years ago)  presented to the emergency department due to 33-month history of right leg weakness with difficulty standing up and numbness.    Interim history Patient was admitted with right leg weakness and shortness of breath.    Cardiology was consulted and recommended CTA of the chest to rule out dissection and patient also underwent cardiac catheterization as well as TEE.  Patient was noted to have severe multivessel disease and significant aortic regurgitation which requires CABG as well as aortic valve replacement and possible mitral valve repair.  Cardiothoracic surgery has been consulted.  Dentistry also consulted, s/p multiple tooth extractions.  Pending CABG, AVR on 04/09/2020.  Assessment/Plan:  Principal Problem:   Right leg weakness Active Problems:   Flash pulmonary edema (HCC)   Hypomagnesemia   Hypoalbuminemia   Hyperglycemia   Hyponatremia   Elevated troponin   Pulmonary nodule   Acute respiratory failure with hypoxia (HCC)   Tobacco abuse   Acute pulmonary edema (HCC)   Weakness of right lower extremity   Acute systolic heart failure (HCC)   Mitral regurgitation   Nonrheumatic aortic valve insufficiency   Non-ST elevation (NSTEMI) myocardial infarction [facility])   Right leg weakness/back pain -CT head scan was nonacute.  CT lumbar spine without any acute abnormality except multilevel degenerative changes at L4-L5 with severe spinal stenosis.  MRI of the  lumbar spine showed congenital spinal canal narrowing with chronic L4 compression fracture deformity with mild height loss.  Moderate to severe spinal  canal narrowing at L4-5 level.  Moderate right L2-3, right L4-5 neural foraminal narrowing.  L3-4 disc bulge abutment of the existing L3, descending L4 nerve roots.  L5-S1 disc bulge abutment of the extending L5 and descending S1 nerve roots.  Neurology was consulted and findings have been chronic.  Physical therapy has been consulted who recommend home health discharge.  Patient has been able to walk up and down the hall during hospitalization.  Patient also received high-dose thiamine while in the hospital.  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  Improved.  Patient had a chest x-ray showed interstitial pulmonary edema with emphysema.  Initially was on BiPAP but has been transitioned to room air. CT angiogram of the chest showed no evidence of aortic dissection but bilateral pleural effusion and patchy opacification of the lungs.  2D echocardiogram was performed which showed left ventricular ejection fraction of 35 to 40%, grade 2 diastolic dysfunction along with significant AR and MR. Cardiology recommended right and left heart cath which showed  severe multivessel disease and severe aortic regurgitation and moderate to severe mitral regurgitation.  No thrombus or mass was noted.  Severe AI, moderate MR that is eccentric appears partially due to annular dilation as appears to have a cleft/perforation adjacent to P2.  Continue aspirin, Lipitor, losartan, metoprolol, spironolactone  NSTEMI With elevated troponin dyspnea.  Cardiothoracic on board and plan is CABG/AVR and mitral valve repair- pending on 04/09/2020  Elevated D-dimer Lower extremity Dopplers negative for DVT  Severe aortic regurgitation/mitral regurgitation Noted on echocardiogram.  CT surgery on board for surgery.  Dental  caries Patient was seen by dentist. s/p dental extraction.  Diabetes mellitus, type II, uncontrolled  with hyperglycemia -Hemoglobin A1c 10.2.  Continue sliding scale insulin, Accu-Cheks, diabetic diet.  Mild hypomagnesemia Continue replacement.  Give IV magnesium sulfate x1 today for magnesium of 1.6.  Hypoalbuminemia -Nutrition consulted and appreciated  Incidental pulmonary nodule/paratracheal lymph node -Noted on imaging noted above. Patient will need to follow-up outpatient  Tobacco abuse  Nicotine patch  Hyponatremia Asymptomatic at this time.  Off diuretics.  Will need to closely monitor.  Remove 125 today.  Leukocytosis Likely reactive.  WBC of 11.0  Normocytic anemia Hemoglobin of 12.8.  Acute anxiety -Resolved, suspect secondary from recent anesthesia  DVT prophylaxis: SCDs Start: 04/03/20 1100 heparin injection 5,000 Units Start: 03/29/20 1730   Code Status: Full code  Family Communication: None  Status is: Inpatient  Remains inpatient appropriate because:Inpatient level of care appropriate due to severity of illness and Surgical intervention   Dispo: The patient is from: Home              Anticipated d/c is to: To to determined              Anticipated d/c date is: 3 days              Patient currently is not medically stable to d/c.       Consultants:  Cardiology  Cardiothoracic surgery  Dental medicine  Neurology  Procedures:  dental extraction on 04/03/2020  TEE  Right and left heart catheterization  Multiple extraction of tooth numbers11, 22, 23, 26, 27, 28, and 29; ThreeQuadrants of alveoloplasty;Bilateral mandibular lingual tori reductions  Antibiotics:  . None at this time  Anti-infectives (From admission, onward)   Start     Dose/Rate Route Frequency Ordered Stop   04/09/20 0400  vancomycin (VANCOREADY) IVPB 1250 mg/250 mL        1,250 mg 166.7 mL/hr over 90 Minutes Intravenous To Surgery 04/08/20 1007 04/10/20 0400   04/09/20 0400  levofloxacin (LEVAQUIN) IVPB 500 mg        500 mg 100 mL/hr over 60 Minutes  Intravenous To Surgery 04/08/20 1007 04/10/20 0400   04/02/20 0400  vancomycin (VANCOREADY) IVPB 1250 mg/250 mL  Status:  Discontinued        1,250 mg 166.7 mL/hr over 90 Minutes Intravenous To Surgery 04/01/20 1045 04/01/20 1142   04/02/20 0400  levofloxacin (LEVAQUIN) IVPB 500 mg  Status:  Discontinued        500 mg 100 mL/hr over 60 Minutes Intravenous To Surgery 04/01/20 1045 04/01/20 1142   04/02/20 0000  clindamycin (CLEOCIN) IVPB 600 mg        600 mg 100 mL/hr over 30 Minutes Intravenous  Once 04/01/20 1850 04/03/20 0815     Subjective: Today, patient was seen and examined at bedside.  Denies any leg pain, shortness of breath, chest pain, dyspnea.  Has been ambulating okay.  Objective: Vitals:   04/08/20 0527 04/08/20 1433  BP: 134/67 (!) 108/51  Pulse: 72 66  Resp: 18 16  Temp: 98.1 F (36.7 C) (!) 97.4 F (36.3 C)  SpO2: 97% 97%    Intake/Output Summary (Last 24 hours) at 04/08/2020 1447 Last data filed at 04/08/2020 0530 Gross per 24 hour  Intake 462 ml  Output 1575 ml  Net -1113 ml   Filed Weights   04/05/20 0333 04/06/20 0605 04/07/20 0351  Weight: 66.6 kg 64.9 kg 66.5 kg   Body mass index is 22.98 kg/m.  Physical Exam: GENERAL: Patient is alert awake and oriented. Not in obvious distress.  Thinly built, HENT: No scleral pallor or icterus. Pupils equally reactive to light. Oral mucosa is moist, edentulous NECK: is supple, no gross swelling noted. CHEST: Clear to auscultation.   Diminished breath sounds bilaterally. CVS: S1 and S2 heard, systolic murmur. Regular rate and rhythm.  ABDOMEN: Soft, non-tender, bowel sounds are present. EXTREMITIES: Trace bilateral lower extremity edema noted CNS: Cranial nerves are intact. No focal motor deficits. SKIN: warm and dry without rashes.  Data Review: I have personally reviewed the following laboratory data and studies,  CBC: Recent Labs  Lab 04/02/20 0430 04/02/20 0430 04/03/20 0435 04/04/20 0210  04/05/20 0502 04/07/20 0408 04/08/20 0948  WBC 11.0*  --  10.8* 13.6* 11.4*  --  11.0*  HGB 12.6*   < > 12.7* 11.9* 11.8* 12.6* 12.8*  HCT 37.8*   < > 36.9* 35.6* 34.6* 37.4* 37.5*  MCV 86.9  --  86.6 86.4 86.5  --  86.4  PLT 344  --  351 312 310  --  412*   < > = values in this interval not displayed.   Basic Metabolic Panel: Recent Labs  Lab 04/04/20 0210 04/05/20 0502 04/06/20 8676 04/07/20 0408 04/08/20 0948  NA 126* 126* 131* 127* 125*  K 3.9 4.5 4.6 4.7 4.8  CL 89* 90* 95* 93* 89*  CO2 27 26 25 26 26   GLUCOSE 172* 120* 94 119* 223*  BUN 13 10 10 12 11   CREATININE 0.75 0.64 0.73 0.72 0.57*  CALCIUM 9.0 9.0 9.2 9.1 9.0  MG  --   --   --   --  1.6*   Liver Function Tests: No results for input(s): AST, ALT, ALKPHOS, BILITOT, PROT, ALBUMIN in the last 168 hours. No results for input(s): LIPASE, AMYLASE in the last 168 hours. No results for input(s): AMMONIA in the last 168 hours. Cardiac Enzymes: No results for input(s): CKTOTAL, CKMB, CKMBINDEX, TROPONINI in the last 168 hours. BNP (last 3 results) No results for input(s): BNP in the last 8760 hours.  ProBNP (last 3 results) No results for input(s): PROBNP in the last 8760 hours.  CBG: Recent Labs  Lab 04/07/20 1129 04/07/20 1726 04/07/20 2106 04/08/20 0801 04/08/20 1111  GLUCAP 212* 185* 250* 161* 174*   No results found for this or any previous visit (from the past 240 hour(s)).   Studies: No results found.    04/10/20, MD  [facility] 04/08/2020

## 2020-04-08 NOTE — Progress Notes (Signed)
CARDIAC REHAB PHASE I   PRE:  Rate/Rhythm: 73 SR    BP: sitting 108/51    SaO2: 100 RA  MODE:  Ambulation: 220 ft   POST:  Rate/Rhythm: 84 SR    BP: sitting 140/62     SaO2:   Tolerated well, feeling good. Pt had to return to room due to stool incontinence. No c/o.  [id number]   Harriet Masson CES, ACSM 04/08/2020 3:30 PM

## 2020-04-08 NOTE — Anesthesia Preprocedure Evaluation (Addendum)
Anesthesia Evaluation  Patient identified by MRN, date of birth, ID band Patient awake    Reviewed: Allergy & Precautions, NPO status , Patient's Chart, lab work & pertinent test results  History of Anesthesia Complications Negative for: history of anesthetic complications  Airway Mallampati: II  TM Distance: >3 FB Neck ROM: Full    Dental  (+) Edentulous Upper, Edentulous Lower   Pulmonary Current Smoker and Patient abstained from smoking.,    Pulmonary exam normal        Cardiovascular hypertension, + Past MI  + Valvular Problems/Murmurs MR and AI  Rhythm:Regular Rate:Normal + Systolic murmurs  '21 TEE - Both global hypokinesis and focal wall motion abnormalities noted, most prominent in septal and inferior walls. EF 35 to 40%. LV internal cavity size was mildly dilated. There is mild LVH. Abnormal structure, appears to have some MR due to annular dilation, but there is also a focal eccentric jet of regurgitation. There appears to possible be a cleft or perforation on 3D images adjacent to P2. PISA attempted but likely inaccurate due to eccentricity and multiple jets. Moderate-severe MR. Severe AI. Moderate (Grade III) plaque involving the descending aorta.   '21 Cath - Prox RCA to Mid RCA lesion is 80% stenosed. RPDA lesion is 90% stenosed. 1st Mrg lesion is 100% stenosed. 3rd Mrg lesion is 75% stenosed. Prox Cx to Mid Cx lesion is 75% stenosed. Mid LAD lesion is 100% stenosed. Mid LAD to Dist LAD lesion is 70% stenosed. 2nd Diag lesion is 40% stenosed. Prox LAD to Mid LAD lesion is 25% stenosed. LV end diastolic pressure is normal. There is moderate (3+) aortic regurgitation. There is mild left ventricular systolic dysfunction. The left ventricular ejection fraction is 35-45%     Neuro/Psych negative neurological ROS  negative psych ROS   GI/Hepatic negative GI ROS, (+)     substance abuse  alcohol use,    Endo/Other  diabetes, Type 2 Hyponatremia, chronic vs. diuretic-induced Hypochloremia   Renal/GU negative Renal ROS     Musculoskeletal negative musculoskeletal ROS (+)   Abdominal   Peds  Hematology  (+) anemia ,   Anesthesia Other Findings Covid test negative   Reproductive/Obstetrics                            Anesthesia Physical Anesthesia Plan  ASA: IV  Anesthesia Plan: General   Post-op Pain Management:    Induction: Intravenous  PONV Risk Score and Plan: 1 and Treatment may vary due to age or medical condition  Airway Management Planned: Oral ETT  Additional Equipment: Arterial line, CVP, PA Cath, TEE and Ultrasound Guidance Line Placement  Intra-op Plan:   Post-operative Plan: Post-operative intubation/ventilation  Informed Consent: I have reviewed the patients History and Physical, chart, labs and discussed the procedure including the risks, benefits and alternatives for the proposed anesthesia with the patient or authorized representative who has indicated his/her understanding and acceptance.     Dental advisory given  Plan Discussed with: CRNA, Anesthesiologist and Surgeon  Anesthesia Plan Comments: (Discussed hyponatremia with surgeon, does not want to delay case for correction of hyponatremia )       Anesthesia Quick Evaluation

## 2020-04-08 NOTE — Progress Notes (Signed)
Progress Note  Patient Name: Scott Rios Date of Encounter: 04/08/2020  [facility] Cardiologist: No primary care provider on file.   Subjective   Denies any chest pain or dyspnea.  Labs pending this morning.  Net negative 2.3L yesterday on PO lasix 40 mg.  Inpatient Medications    Scheduled Meds: . aspirin EC  81 mg Oral Daily  . atorvastatin  80 mg Oral Daily  . feeding supplement (GLUCERNA SHAKE)  237 mL Oral TID BM  . heparin injection (subcutaneous)  5,000 Units Subcutaneous Q8H  . insulin aspart  0-5 Units Subcutaneous QHS  . insulin aspart  0-9 Units Subcutaneous TID WC  . insulin glargine  14 Units Subcutaneous Daily  . losartan  25 mg Oral Daily  . metoprolol tartrate  25 mg Oral BID  . multivitamin with minerals  1 tablet Oral Daily  . nicotine  21 mg Transdermal Daily  . polyethylene glycol  17 g Oral BID  . senna-docusate  1 tablet Oral BID  . sodium chloride flush  3 mL Intravenous Q12H  . spironolactone  12.5 mg Oral Daily   Continuous Infusions: . sodium chloride 10 mL/hr at 04/02/20 2340  . lactated ringers Stopped (04/03/20 1900)   PRN Meds: sodium chloride, acetaminophen, diphenhydrAMINE, HYDROcodone-acetaminophen, ondansetron (ZOFRAN) IV, sodium chloride flush   Vital Signs    Vitals:   04/07/20 1200 04/07/20 2025 04/08/20 0005 04/08/20 0527  BP: 118/60 131/66 136/68 134/67  Pulse: 61 77 85 72  Resp:  20 17 18   Temp: 97.8 F (36.6 C) 97.8 F (36.6 C) 98 F (36.7 C) 98.1 F (36.7 C)  TempSrc: Oral Oral Oral Oral  SpO2: 95% 93% 96% 97%  Weight:      Height:        Intake/Output Summary (Last 24 hours) at 04/08/2020 0910 Last data filed at 04/08/2020 0530 Gross per 24 hour  Intake 825 ml  Output 2700 ml  Net -1875 ml   Last 3 Weights 04/07/2020 04/06/2020 04/05/2020  Weight (lbs) 146 lb 11.2 oz 143 lb 146 lb 14.3 oz  Weight (kg) 66.543 kg 64.864 kg 66.63 kg      Telemetry    NSR rate 80s Personally Reviewed  Physical Exam    GEN: Well nourished, well developed, in no acute distress  HEENT: normal  Neck: no JVD, carotid bruits, or masses Cardiac: RRR; 2/6 systolic murmur Respiratory:  clear to auscultation bilaterally, normal work of breathing GI: soft, nontender, nondistended, + BS MS: no deformity or atrophy  Skin: warm and dry, no rash Neuro:  Alert and Oriented x 3, Strength and sensation are intact Psych: euthymic mood, full affect    Labs    High Sensitivity Troponin:   Recent Labs  Lab 03/27/20 2317 03/28/20 0324 03/28/20 0819 03/28/20 1006 03/29/20 0941  TROPONINIHS 27* 121* 1,479* 1,747* 629*      Chemistry Recent Labs  Lab 04/05/20 0502 04/06/20 0613 04/07/20 0408  NA 126* 131* 127*  K 4.5 4.6 4.7  CL 90* 95* 93*  CO2 26 25 26   GLUCOSE 120* 94 119*  BUN 10 10 12   CREATININE 0.64 0.73 0.72  CALCIUM 9.0 9.2 9.1  GFRNONAA >60 >60 >60  GFRAA >60 >60 >60  ANIONGAP 10 11 8      Hematology Recent Labs  Lab 04/03/20 0435 04/03/20 0435 04/04/20 0210 04/05/20 0502 04/07/20 0408  WBC 10.8*  --  13.6* 11.4*  --   RBC 4.26  --  4.12* 4.00*  --  HGB 12.7*   < > 11.9* 11.8* 12.6*  HCT 36.9*   < > 35.6* 34.6* 37.4*  MCV 86.6  --  86.4 86.5  --   MCH 29.8  --  28.9 29.5  --   MCHC 34.4  --  33.4 34.1  --   RDW 13.4  --  13.2 13.2  --   PLT 351  --  312 310  --    < > = values in this interval not displayed.    BNPNo results for input(s): BNP, PROBNP in the last 168 hours.   DDimer  No results for input(s): DDIMER in the last 168 hours.   Radiology    No results found.  Cardiac Studies   Diagnostic Dominance: Right   TEE: Severe aortic insufficiency, moderate to severe mitral regurgitation, EF 35%  Patient Profile     64 y.o. male with severe aortic insufficiency moderate to severe mitral regurgitation EF 35% triple-vessel coronary artery disease awaiting CABG/aortic valve replacement/possible mitral valve repair  Assessment & Plan    CAD/non-STEMI/severe  aortic insufficiency/moderate mitral regurgitation/ischemic cardiomyopathy/acute systolic heart failure/hyponatremia -Planning CABG/AVR/potential mitral repair with Dr Laneta Simmers tomorrow -Echo with EF 35 to 40% -Continue ASA, statin, metoprolol, losartan, spironolactone -Worsening hyponatremia, labs pending this AM.  Suspect due to overdiuresis, will hold lasix for now   For questions or updates, please contact [facility] Please consult [email or web address] for contact info under        Signed, Little Ishikawa, MD  04/08/2020, 9:10 AM

## 2020-04-09 ENCOUNTER — Inpatient Hospital Stay (HOSPITAL_COMMUNITY): Payer: Medicare Other

## 2020-04-09 ENCOUNTER — Inpatient Hospital Stay (HOSPITAL_COMMUNITY)
Admission: EM | Disposition: A | Discharge status: Home / Self Care | Payer: Self-pay | Source: Home / Self Care | Attending: Surgery

## 2020-04-09 ENCOUNTER — Inpatient Hospital Stay (HOSPITAL_COMMUNITY): Payer: Medicare Other | Admitting: Anesthesiology

## 2020-04-09 DIAGNOSIS — Z951 Presence of aortocoronary bypass graft: Secondary | ICD-10-CM

## 2020-04-09 DIAGNOSIS — I351 Nonrheumatic aortic (valve) insufficiency: Secondary | ICD-10-CM

## 2020-04-09 DIAGNOSIS — I340 Nonrheumatic mitral (valve) insufficiency: Secondary | ICD-10-CM

## 2020-04-09 HISTORY — PX: ENDOVEIN HARVEST OF GREATER SAPHENOUS VEIN: SHX5059

## 2020-04-09 HISTORY — PX: CLIPPING OF ATRIAL APPENDAGE: SHX5773

## 2020-04-09 HISTORY — PX: MITRAL VALVE REPAIR: SHX2039

## 2020-04-09 HISTORY — PX: TEE WITHOUT CARDIOVERSION: SHX5443

## 2020-04-09 HISTORY — PX: AORTIC VALVE REPLACEMENT: SHX41

## 2020-04-09 HISTORY — PX: CORONARY ARTERY BYPASS GRAFT: SHX141

## 2020-04-09 LAB — PREPARE RBC (CROSSMATCH)

## 2020-04-09 LAB — CBC
HCT: 36.7 % — ABNORMAL LOW (ref 39.0–52.0)
HCT: 27.8 % — ABNORMAL LOW (ref 39.0–52.0)
HCT: 22.3 % — ABNORMAL LOW (ref 39.0–52.0)
Hemoglobin: 12.4 g/dL — ABNORMAL LOW (ref 13.0–17.0)
Hemoglobin: 9.4 g/dL — ABNORMAL LOW (ref 13.0–17.0)
Hemoglobin: 7.7 g/dL — ABNORMAL LOW (ref 13.0–17.0)
MCH: 28.9 pg (ref 26.0–34.0)
MCH: 29.7 pg (ref 26.0–34.0)
MCH: 30.3 pg (ref 26.0–34.0)
MCHC: 33.8 g/dL (ref 30.0–36.0)
MCHC: 33.8 g/dL (ref 30.0–36.0)
MCHC: 34.5 g/dL (ref 30.0–36.0)
MCV: 85.5 fL (ref 80.0–100.0)
MCV: 87.7 fL (ref 80.0–100.0)
MCV: 87.8 fL (ref 80.0–100.0)
Platelets: 417 10*3/uL — ABNORMAL HIGH (ref 150–400)
Platelets: 179 10*3/uL (ref 150–400)
Platelets: 200 10*3/uL (ref 150–400)
RBC: 4.29 MIL/uL (ref 4.22–5.81)
RBC: 3.17 MIL/uL — ABNORMAL LOW (ref 4.22–5.81)
RBC: 2.54 MIL/uL — ABNORMAL LOW (ref 4.22–5.81)
RDW: 13.4 % (ref 11.5–15.5)
RDW: 13.4 % (ref 11.5–15.5)
RDW: 13.7 % (ref 11.5–15.5)
WBC: 11.7 10*3/uL — ABNORMAL HIGH (ref 4.0–10.5)
WBC: 22.7 10*3/uL — ABNORMAL HIGH (ref 4.0–10.5)
WBC: 19.5 10*3/uL — ABNORMAL HIGH (ref 4.0–10.5)
nRBC: 0 % (ref 0.0–0.2)
nRBC: 0 % (ref 0.0–0.2)
nRBC: 0 % (ref 0.0–0.2)

## 2020-04-09 LAB — HEMOGLOBIN A1C
Hgb A1c MFr Bld: 10 % — ABNORMAL HIGH (ref 4.8–5.6)
Mean Plasma Glucose: 240.3 mg/dL

## 2020-04-09 LAB — FIBRINOGEN: Fibrinogen: 267 mg/dL (ref 210–475)

## 2020-04-09 LAB — GLUCOSE, CAPILLARY
Glucose-Capillary: 141 mg/dL — ABNORMAL HIGH (ref 70–99)
Glucose-Capillary: 103 mg/dL — ABNORMAL HIGH (ref 70–99)
Glucose-Capillary: 72 mg/dL (ref 70–99)
Glucose-Capillary: 100 mg/dL — ABNORMAL HIGH (ref 70–99)
Glucose-Capillary: 137 mg/dL — ABNORMAL HIGH (ref 70–99)
Glucose-Capillary: 124 mg/dL — ABNORMAL HIGH (ref 70–99)
Glucose-Capillary: 140 mg/dL — ABNORMAL HIGH (ref 70–99)

## 2020-04-09 LAB — BASIC METABOLIC PANEL
Anion gap: 9 (ref 5–15)
BUN: 12 mg/dL (ref 8–23)
CO2: 21 mmol/L — ABNORMAL LOW (ref 22–32)
Calcium: 7.7 mg/dL — ABNORMAL LOW (ref 8.9–10.3)
Chloride: 102 mmol/L (ref 98–111)
Creatinine, Ser: 0.56 mg/dL — ABNORMAL LOW (ref 0.61–1.24)
GFR calc Af Amer: >60 mL/min (ref >60)
GFR calc non Af Amer: >60 mL/min (ref >60)
Glucose, Bld: 143 mg/dL — ABNORMAL HIGH (ref 70–99)
Potassium: 4.5 mmol/L (ref 3.5–5.1)
Sodium: 132 mmol/L — ABNORMAL LOW (ref 135–145)

## 2020-04-09 LAB — COMPREHENSIVE METABOLIC PANEL
ALT: 19 U/L (ref 0–44)
AST: 17 U/L (ref 15–41)
Albumin: 3.2 g/dL — ABNORMAL LOW (ref 3.5–5.0)
Alkaline Phosphatase: 69 U/L (ref 38–126)
Anion gap: 7 (ref 5–15)
BUN: 14 mg/dL (ref 8–23)
CO2: 24 mmol/L (ref 22–32)
Calcium: 8.8 mg/dL — ABNORMAL LOW (ref 8.9–10.3)
Chloride: 93 mmol/L — ABNORMAL LOW (ref 98–111)
Creatinine, Ser: 0.84 mg/dL (ref 0.61–1.24)
GFR calc Af Amer: >60 mL/min (ref >60)
GFR calc non Af Amer: >60 mL/min (ref >60)
Glucose, Bld: 180 mg/dL — ABNORMAL HIGH (ref 70–99)
Potassium: 4.7 mmol/L (ref 3.5–5.1)
Sodium: 124 mmol/L — ABNORMAL LOW (ref 135–145)
Total Bilirubin: 0.5 mg/dL (ref 0.3–1.2)
Total Protein: 5.8 g/dL — ABNORMAL LOW (ref 6.5–8.1)

## 2020-04-09 LAB — SURGICAL PCR SCREEN
MRSA, PCR: NEGATIVE
Staphylococcus aureus: NEGATIVE

## 2020-04-09 LAB — MAGNESIUM
Magnesium: 1.9 mg/dL (ref 1.7–2.4)
Magnesium: 2.8 mg/dL — ABNORMAL HIGH (ref 1.7–2.4)

## 2020-04-09 LAB — URINALYSIS, ROUTINE W REFLEX MICROSCOPIC
Bilirubin Urine: NEGATIVE
Glucose, UA: NEGATIVE mg/dL
Hgb urine dipstick: NEGATIVE
Ketones, ur: NEGATIVE mg/dL
Leukocytes,Ua: NEGATIVE
Nitrite: NEGATIVE
Protein, ur: NEGATIVE mg/dL
Specific Gravity, Urine: 1.006 (ref 1.005–1.030)
pH: 8 (ref 5.0–8.0)

## 2020-04-09 LAB — BLOOD GAS, ARTERIAL
Acid-Base Excess: 0.9 mmol/L (ref 0.0–2.0)
Bicarbonate: 24.7 mmol/L (ref 20.0–28.0)
FIO2: 21
O2 Saturation: 95 %
Patient temperature: 36.7
pCO2 arterial: 36.9 mmHg (ref 32.0–48.0)
pH, Arterial: 7.439 (ref 7.350–7.450)
pO2, Arterial: 68.2 mmHg — ABNORMAL LOW (ref 83.0–108.0)

## 2020-04-09 LAB — APTT
aPTT: 30 seconds (ref 24–36)
aPTT: 37 seconds — ABNORMAL HIGH (ref 24–36)
aPTT: 40 seconds — ABNORMAL HIGH (ref 24–36)

## 2020-04-09 LAB — PROTIME-INR
INR: 1.1 (ref 0.8–1.2)
INR: 1.7 — ABNORMAL HIGH (ref 0.8–1.2)
INR: 1.4 — ABNORMAL HIGH (ref 0.8–1.2)
Prothrombin Time: 13.4 seconds (ref 11.4–15.2)
Prothrombin Time: 18.9 seconds — ABNORMAL HIGH (ref 11.4–15.2)
Prothrombin Time: 16.4 seconds — ABNORMAL HIGH (ref 11.4–15.2)

## 2020-04-09 LAB — PLATELET COUNT: Platelets: 212 10*3/uL (ref 150–400)

## 2020-04-09 LAB — PHOSPHORUS: Phosphorus: 3.8 mg/dL (ref 2.5–4.6)

## 2020-04-09 LAB — HEMOGLOBIN AND HEMATOCRIT, BLOOD
HCT: 25 % — ABNORMAL LOW (ref 39.0–52.0)
Hemoglobin: 8.5 g/dL — ABNORMAL LOW (ref 13.0–17.0)

## 2020-04-09 SURGERY — CORONARY ARTERY BYPASS GRAFTING (CABG)
Anesthesia: General | Site: Leg Upper | Laterality: Right

## 2020-04-09 MED ORDER — MILRINONE LACTATE IN DEXTROSE 20-5 MG/100ML-% IV SOLN: 0.1250 ug/kg/min | INTRAVENOUS | Status: DC

## 2020-04-09 MED ORDER — SODIUM CHLORIDE 0.9 % IV SOLN: 250.0000 mL | INTRAVENOUS | Status: DC

## 2020-04-09 MED ORDER — ONDANSETRON HCL 4 MG/2ML IJ SOLN: 4.0000 mg | Freq: Four times a day (QID) | INTRAMUSCULAR | Status: DC | PRN

## 2020-04-09 MED ORDER — ROCURONIUM BROMIDE 10 MG/ML (PF) SYRINGE
PREFILLED_SYRINGE | INTRAVENOUS | Status: AC
  Filled 2020-04-09: qty 20

## 2020-04-09 MED ORDER — VANCOMYCIN HCL IN DEXTROSE 1-5 GM/200ML-% IV SOLN
1000.0000 mg | Freq: Once | INTRAVENOUS | Status: AC
  Administered 2020-04-09: 1000 mg via INTRAVENOUS
  Filled 2020-04-09: qty 200

## 2020-04-09 MED ORDER — BISACODYL 10 MG RE SUPP: 10.0000 mg | Freq: Every day | RECTAL | Status: DC

## 2020-04-09 MED ORDER — PANTOPRAZOLE SODIUM 40 MG PO TBEC
40.0000 mg | DELAYED_RELEASE_TABLET | Freq: Every day | ORAL | Status: DC
  Administered 2020-04-11 – 2020-04-14 (×4): 40 mg via ORAL
  Filled 2020-04-09 (×4): qty 1

## 2020-04-09 MED ORDER — ASPIRIN 81 MG PO CHEW
324.0000 mg | CHEWABLE_TABLET | Freq: Every day | ORAL | Status: DC
  Filled 2020-04-09: qty 4

## 2020-04-09 MED ORDER — LACTATED RINGERS IV SOLN: INTRAVENOUS | Status: DC | PRN

## 2020-04-09 MED ORDER — MIDAZOLAM HCL (PF) 10 MG/2ML IJ SOLN
INTRAMUSCULAR | Status: AC
  Filled 2020-04-09: qty 2

## 2020-04-09 MED ORDER — ROCURONIUM BROMIDE 10 MG/ML (PF) SYRINGE
PREFILLED_SYRINGE | INTRAVENOUS | Status: DC | PRN
  Administered 2020-04-09: 50 mg via INTRAVENOUS
  Administered 2020-04-09: 100 mg via INTRAVENOUS
  Administered 2020-04-09 (×2): 50 mg via INTRAVENOUS

## 2020-04-09 MED ORDER — ACETAMINOPHEN 650 MG RE SUPP
650.0000 mg | Freq: Once | RECTAL | Status: AC
  Administered 2020-04-09: 650 mg via RECTAL

## 2020-04-09 MED ORDER — THROMBIN 20000 UNITS EX SOLR
CUTANEOUS | Status: DC | PRN
  Administered 2020-04-09: 20000 [IU] via TOPICAL

## 2020-04-09 MED ORDER — PROPOFOL 10 MG/ML IV BOLUS
INTRAVENOUS | Status: DC | PRN
  Administered 2020-04-09: 20 mg via INTRAVENOUS

## 2020-04-09 MED ORDER — FENTANYL CITRATE (PF) 250 MCG/5ML IJ SOLN
INTRAMUSCULAR | Status: DC | PRN
Start: 2020-04-09 — End: 2020-04-09
  Administered 2020-04-09: 150 ug via INTRAVENOUS
  Administered 2020-04-09 (×3): 50 ug via INTRAVENOUS
  Administered 2020-04-09: 150 ug via INTRAVENOUS
  Administered 2020-04-09: 100 ug via INTRAVENOUS
  Administered 2020-04-09: 200 ug via INTRAVENOUS
  Administered 2020-04-09 (×2): 100 ug via INTRAVENOUS

## 2020-04-09 MED ORDER — DEXMEDETOMIDINE HCL IN NACL 400 MCG/100ML IV SOLN
0.0000 ug/kg/h | INTRAVENOUS | Status: DC
  Administered 2020-04-09: 0.7 ug/kg/h via INTRAVENOUS
  Filled 2020-04-09: qty 100

## 2020-04-09 MED ORDER — DOCUSATE SODIUM 100 MG PO CAPS
200.0000 mg | ORAL_CAPSULE | Freq: Every day | ORAL | Status: DC
  Administered 2020-04-10 – 2020-04-12 (×3): 200 mg via ORAL
  Filled 2020-04-09 (×3): qty 2

## 2020-04-09 MED ORDER — CHLORHEXIDINE GLUCONATE 0.12% ORAL RINSE (MEDLINE KIT): 15.0000 mL | Freq: Two times a day (BID) | OROMUCOSAL | Status: DC

## 2020-04-09 MED ORDER — FENTANYL CITRATE (PF) 250 MCG/5ML IJ SOLN
INTRAMUSCULAR | Status: AC
  Filled 2020-04-09: qty 20

## 2020-04-09 MED ORDER — NITROGLYCERIN IN D5W 200-5 MCG/ML-% IV SOLN: 0.0000 ug/min | INTRAVENOUS | Status: DC

## 2020-04-09 MED ORDER — PROTAMINE SULFATE 10 MG/ML IV SOLN
INTRAVENOUS | Status: AC
  Filled 2020-04-09: qty 25

## 2020-04-09 MED ORDER — CHLORHEXIDINE GLUCONATE CLOTH 2 % EX PADS
6.0000 | MEDICATED_PAD | Freq: Every day | CUTANEOUS | Status: DC
  Administered 2020-04-09: 6 via TOPICAL

## 2020-04-09 MED ORDER — ACETAMINOPHEN 500 MG PO TABS
1000.0000 mg | ORAL_TABLET | Freq: Four times a day (QID) | ORAL | Status: DC
  Administered 2020-04-10 – 2020-04-14 (×13): 1000 mg via ORAL
  Filled 2020-04-09 (×13): qty 2

## 2020-04-09 MED ORDER — LACTATED RINGERS IV SOLN: INTRAVENOUS | Status: DC

## 2020-04-09 MED ORDER — NOREPINEPHRINE 4 MG/250ML-% IV SOLN: INTRAVENOUS | Status: DC | PRN

## 2020-04-09 MED ORDER — HEPARIN SODIUM (PORCINE) 1000 UNIT/ML IJ SOLN
INTRAMUSCULAR | Status: AC
  Filled 2020-04-09: qty 1

## 2020-04-09 MED ORDER — OXYCODONE HCL 5 MG PO TABS
5.0000 mg | ORAL_TABLET | ORAL | Status: DC | PRN
  Administered 2020-04-11: 5 mg via ORAL
  Administered 2020-04-12 – 2020-04-13 (×3): 10 mg via ORAL
  Filled 2020-04-09 (×3): qty 2
  Filled 2020-04-09 (×2): qty 1

## 2020-04-09 MED ORDER — POTASSIUM CHLORIDE 10 MEQ/50ML IV SOLN
10.0000 meq | INTRAVENOUS | Status: AC
  Administered 2020-04-09 (×3): 10 meq via INTRAVENOUS

## 2020-04-09 MED ORDER — METOPROLOL TARTRATE 5 MG/5ML IV SOLN: 2.5000 mg | INTRAVENOUS | Status: DC | PRN

## 2020-04-09 MED ORDER — SODIUM CHLORIDE 0.9% IV SOLUTION: Freq: Once | INTRAVENOUS | Status: AC

## 2020-04-09 MED ORDER — METOPROLOL TARTRATE 12.5 MG HALF TABLET: 12.5000 mg | ORAL_TABLET | Freq: Two times a day (BID) | ORAL | Status: DC

## 2020-04-09 MED ORDER — TRAMADOL HCL 50 MG PO TABS: 50.0000 mg | ORAL_TABLET | ORAL | Status: DC | PRN

## 2020-04-09 MED ORDER — DEXTROSE 50 % IV SOLN
INTRAVENOUS | Status: DC | PRN
  Administered 2020-04-09: 12.5 g via INTRAVENOUS

## 2020-04-09 MED ORDER — SODIUM CHLORIDE 0.9 % IV SOLN: INTRAVENOUS | Status: DC

## 2020-04-09 MED ORDER — FAMOTIDINE IN NACL 20-0.9 MG/50ML-% IV SOLN
20.0000 mg | Freq: Two times a day (BID) | INTRAVENOUS | Status: AC
  Administered 2020-04-09 – 2020-04-10 (×2): 20 mg via INTRAVENOUS
  Filled 2020-04-09 (×2): qty 50

## 2020-04-09 MED ORDER — MIDAZOLAM HCL 5 MG/5ML IJ SOLN
INTRAMUSCULAR | Status: DC | PRN
  Administered 2020-04-09: 1 mg via INTRAVENOUS
  Administered 2020-04-09: 2 mg via INTRAVENOUS
  Administered 2020-04-09: 3 mg via INTRAVENOUS
  Administered 2020-04-09 (×2): 2 mg via INTRAVENOUS

## 2020-04-09 MED ORDER — ROCURONIUM BROMIDE 10 MG/ML (PF) SYRINGE
PREFILLED_SYRINGE | INTRAVENOUS | Status: AC
  Filled 2020-04-09: qty 10

## 2020-04-09 MED ORDER — PLASMA-LYTE 148 IV SOLN
INTRAVENOUS | Status: DC | PRN
  Administered 2020-04-09: 500 mL via INTRAVASCULAR

## 2020-04-09 MED ORDER — EPHEDRINE 5 MG/ML INJ
INTRAVENOUS | Status: AC
  Filled 2020-04-09: qty 10

## 2020-04-09 MED ORDER — THROMBIN (RECOMBINANT) 20000 UNITS EX SOLR
CUTANEOUS | Status: AC
  Filled 2020-04-09: qty 20000

## 2020-04-09 MED ORDER — EPHEDRINE SULFATE-NACL 50-0.9 MG/10ML-% IV SOSY
PREFILLED_SYRINGE | INTRAVENOUS | Status: DC | PRN
  Administered 2020-04-09: 10 mg via INTRAVENOUS

## 2020-04-09 MED ORDER — ASPIRIN EC 325 MG PO TBEC
325.0000 mg | DELAYED_RELEASE_TABLET | Freq: Every day | ORAL | Status: DC
  Administered 2020-04-10 – 2020-04-14 (×5): 325 mg via ORAL
  Filled 2020-04-09 (×5): qty 1

## 2020-04-09 MED ORDER — SODIUM CHLORIDE 0.9% FLUSH: 3.0000 mL | INTRAVENOUS | Status: DC | PRN

## 2020-04-09 MED ORDER — PROTAMINE SULFATE 10 MG/ML IV SOLN
INTRAVENOUS | Status: DC | PRN
  Administered 2020-04-09: 350 mg via INTRAVENOUS

## 2020-04-09 MED ORDER — DEXTROSE 50 % IV SOLN: 0.0000 mL | INTRAVENOUS | Status: DC | PRN

## 2020-04-09 MED ORDER — LEVOFLOXACIN IN D5W 750 MG/150ML IV SOLN
750.0000 mg | INTRAVENOUS | Status: AC
  Administered 2020-04-10: 750 mg via INTRAVENOUS
  Filled 2020-04-09: qty 150

## 2020-04-09 MED ORDER — INSULIN REGULAR(HUMAN) IN NACL 100-0.9 UT/100ML-% IV SOLN: INTRAVENOUS | Status: DC

## 2020-04-09 MED ORDER — MORPHINE SULFATE (PF) 2 MG/ML IV SOLN
1.0000 mg | INTRAVENOUS | Status: DC | PRN
  Administered 2020-04-10 – 2020-04-13 (×6): 2 mg via INTRAVENOUS
  Filled 2020-04-09 (×6): qty 1

## 2020-04-09 MED ORDER — ACETAMINOPHEN 160 MG/5ML PO SOLN
1000.0000 mg | Freq: Four times a day (QID) | ORAL | Status: DC
  Filled 2020-04-09 (×2): qty 40.6

## 2020-04-09 MED ORDER — FENTANYL CITRATE (PF) 250 MCG/5ML IJ SOLN
INTRAMUSCULAR | Status: AC
  Filled 2020-04-09: qty 5

## 2020-04-09 MED ORDER — LACTATED RINGERS IV SOLN
500.0000 mL | Freq: Once | INTRAVENOUS | Status: AC | PRN
  Administered 2020-04-09: 500 mL via INTRAVENOUS

## 2020-04-09 MED ORDER — 0.9 % SODIUM CHLORIDE (POUR BTL) OPTIME
TOPICAL | Status: DC | PRN
  Administered 2020-04-09: 1000 mL
  Administered 2020-04-09: 5000 mL

## 2020-04-09 MED ORDER — METOPROLOL TARTRATE 25 MG/10 ML ORAL SUSPENSION: 12.5000 mg | Freq: Two times a day (BID) | ORAL | Status: DC

## 2020-04-09 MED ORDER — ORAL CARE MOUTH RINSE
15.0000 mL | OROMUCOSAL | Status: DC
  Administered 2020-04-09 – 2020-04-10 (×3): 15 mL via OROMUCOSAL

## 2020-04-09 MED ORDER — SODIUM CHLORIDE 0.9% FLUSH
3.0000 mL | Freq: Two times a day (BID) | INTRAVENOUS | Status: DC
  Administered 2020-04-10 – 2020-04-14 (×8): 3 mL via INTRAVENOUS

## 2020-04-09 MED ORDER — MIDAZOLAM HCL 2 MG/2ML IJ SOLN: 2.0000 mg | INTRAMUSCULAR | Status: DC | PRN

## 2020-04-09 MED ORDER — SODIUM CHLORIDE 0.45 % IV SOLN: INTRAVENOUS | Status: DC | PRN

## 2020-04-09 MED ORDER — PHENYLEPHRINE HCL-NACL 20-0.9 MG/250ML-% IV SOLN
0.0000 ug/min | INTRAVENOUS | Status: DC
  Administered 2020-04-10: 10 ug/min via INTRAVENOUS
  Administered 2020-04-10: 15 ug/min via INTRAVENOUS
  Filled 2020-04-09 (×2): qty 250

## 2020-04-09 MED ORDER — ACETAMINOPHEN 160 MG/5ML PO SOLN: 650.0000 mg | Freq: Once | ORAL | Status: AC

## 2020-04-09 MED ORDER — MAGNESIUM SULFATE 4 GM/100ML IV SOLN
4.0000 g | Freq: Once | INTRAVENOUS | Status: AC
  Administered 2020-04-09: 4 g via INTRAVENOUS

## 2020-04-09 MED ORDER — PROPOFOL 10 MG/ML IV BOLUS
INTRAVENOUS | Status: AC
  Filled 2020-04-09: qty 20

## 2020-04-09 MED ORDER — CHLORHEXIDINE GLUCONATE 0.12 % MT SOLN
15.0000 mL | OROMUCOSAL | Status: AC
  Administered 2020-04-09: 15 mL via OROMUCOSAL

## 2020-04-09 MED ORDER — HEPARIN SODIUM (PORCINE) 1000 UNIT/ML IJ SOLN
INTRAMUSCULAR | Status: DC | PRN
  Administered 2020-04-09: 23000 [IU] via INTRAVENOUS

## 2020-04-09 MED ORDER — EPINEPHRINE HCL 5 MG/250ML IV SOLN IN NS: 0.0000 ug/min | INTRAVENOUS | Status: DC

## 2020-04-09 MED ORDER — HEMOSTATIC AGENTS (NO CHARGE) OPTIME
TOPICAL | Status: DC | PRN
  Administered 2020-04-09 (×4): 1 via TOPICAL

## 2020-04-09 MED ORDER — ALBUMIN HUMAN 5 % IV SOLN: INTRAVENOUS | Status: DC | PRN

## 2020-04-09 MED ORDER — BISACODYL 5 MG PO TBEC
10.0000 mg | DELAYED_RELEASE_TABLET | Freq: Every day | ORAL | Status: DC
  Administered 2020-04-10 – 2020-04-12 (×3): 10 mg via ORAL
  Filled 2020-04-09 (×3): qty 2

## 2020-04-09 MED ORDER — ALBUMIN HUMAN 5 % IV SOLN
250.0000 mL | INTRAVENOUS | Status: DC | PRN
  Administered 2020-04-09 (×2): 12.5 g via INTRAVENOUS
  Filled 2020-04-09: qty 250

## 2020-04-09 SURGICAL SUPPLY — 145 items
ADAPTER CARDIO PERF ANTE/RETRO (ADAPTER) ×5 IMPLANT
ARTICLIP LAA PROCLIP II 40 (Clip) ×5 IMPLANT
BAG DECANTER FOR FLEXI CONT (MISCELLANEOUS) ×5 IMPLANT
BLADE CLIPPER SURG (BLADE) IMPLANT
BLADE STERNUM SYSTEM 6 (BLADE) ×5 IMPLANT
BLADE SURG 15 STRL LF DISP TIS (BLADE) ×4 IMPLANT
BLADE SURG 15 STRL SS (BLADE) ×5
BNDG ELASTIC 4X5.8 VLCR STR LF (GAUZE/BANDAGES/DRESSINGS) ×5 IMPLANT
BNDG ELASTIC 6X5.8 VLCR STR LF (GAUZE/BANDAGES/DRESSINGS) ×5 IMPLANT
BNDG GAUZE ELAST 4 BULKY (GAUZE/BANDAGES/DRESSINGS) ×5 IMPLANT
CANISTER SUCT 3000ML PPV (MISCELLANEOUS) ×5 IMPLANT
CANN PRFSN 3/8X14X24FR PCFC (MISCELLANEOUS) ×4
CANNULA GUNDRY RCSP 15FR (MISCELLANEOUS) ×5 IMPLANT
CANNULA PRFSN 3/8X14X24FR PCFC (MISCELLANEOUS) ×4 IMPLANT
CANNULA SUMP PERICARDIAL (CANNULA) ×5 IMPLANT
CANNULA VEN MTL TIP RT (MISCELLANEOUS) ×5
CANNULA VRC MALB SNGL STG 34FR (MISCELLANEOUS) ×4 IMPLANT
CATH HEART VENT LEFT (CATHETERS) ×8 IMPLANT
CATH ROBINSON RED A/P 18FR (CATHETERS) ×20 IMPLANT
CATH THORACIC 28FR (CATHETERS) ×10 IMPLANT
CATH THORACIC 36FR (CATHETERS) ×5 IMPLANT
CATH THORACIC 36FR RT ANG (CATHETERS) ×5 IMPLANT
CLIP VESOCCLUDE MED 24/CT (CLIP) IMPLANT
CLIP VESOCCLUDE SM WIDE 24/CT (CLIP) ×5 IMPLANT
CNTNR URN SCR LID CUP LEK RST (MISCELLANEOUS) ×4 IMPLANT
CONN 1/2X1/2X1/2  BEN (MISCELLANEOUS) ×5
CONN 1/2X1/2X1/2 BEN (MISCELLANEOUS) ×4 IMPLANT
CONN 3/8X1/2 ST GISH (MISCELLANEOUS) ×10 IMPLANT
CONT SPEC 4OZ STRL OR WHT (MISCELLANEOUS) ×5
COVER MAYO STAND STRL (DRAPES) ×5 IMPLANT
COVER SURGICAL LIGHT HANDLE (MISCELLANEOUS) ×5 IMPLANT
DERMABOND ADVANCED (GAUZE/BANDAGES/DRESSINGS) ×1
DERMABOND ADVANCED .7 DNX12 (GAUZE/BANDAGES/DRESSINGS) ×4 IMPLANT
DEVICE ATRICLIP LAA PRCLPII 40 (Clip) ×4 IMPLANT
DRAPE CARDIOVASCULAR INCISE (DRAPES) ×5
DRAPE SLUSH MACHINE 52X66 (DRAPES) ×5 IMPLANT
DRAPE SLUSH/WARMER DISC (DRAPES) IMPLANT
DRAPE SRG 135X102X78XABS (DRAPES) ×4 IMPLANT
DRSG COVADERM 4X14 (GAUZE/BANDAGES/DRESSINGS) ×5 IMPLANT
ELECT CAUTERY BLADE 6.4 (BLADE) ×5 IMPLANT
ELECT REM PT RETURN 9FT ADLT (ELECTROSURGICAL) ×10
ELECTRODE REM PT RTRN 9FT ADLT (ELECTROSURGICAL) ×8 IMPLANT
FELT TEFLON 1X6 (MISCELLANEOUS) ×10 IMPLANT
GAUZE SPONGE 4X4 12PLY STRL (GAUZE/BANDAGES/DRESSINGS) ×10 IMPLANT
GAUZE SPONGE 4X4 12PLY STRL LF (GAUZE/BANDAGES/DRESSINGS) ×10 IMPLANT
GLOVE BIO SURGEON STRL SZ 6 (GLOVE) ×15 IMPLANT
GLOVE BIO SURGEON STRL SZ 6.5 (GLOVE) ×20 IMPLANT
GLOVE BIO SURGEON STRL SZ7 (GLOVE) ×5 IMPLANT
GLOVE BIO SURGEON STRL SZ7.5 (GLOVE) IMPLANT
GLOVE BIOGEL PI IND STRL 6 (GLOVE) ×4 IMPLANT
GLOVE BIOGEL PI IND STRL 6.5 (GLOVE) ×12 IMPLANT
GLOVE BIOGEL PI IND STRL 7.0 (GLOVE) IMPLANT
GLOVE BIOGEL PI IND STRL 7.5 (GLOVE) ×8 IMPLANT
GLOVE BIOGEL PI IND STRL 8 (GLOVE) ×4 IMPLANT
GLOVE BIOGEL PI INDICATOR 6 (GLOVE) ×1
GLOVE BIOGEL PI INDICATOR 6.5 (GLOVE) ×3
GLOVE BIOGEL PI INDICATOR 7.0 (GLOVE)
GLOVE BIOGEL PI INDICATOR 7.5 (GLOVE) ×2
GLOVE BIOGEL PI INDICATOR 8 (GLOVE) ×1
GLOVE ORTHO TXT STRL SZ7.5 (GLOVE) IMPLANT
GLOVE SURG SS PI 7.5 STRL IVOR (GLOVE) ×10 IMPLANT
GLOVE TRIUMPH SURG SIZE 7.0 (KITS) ×10 IMPLANT
GOWN STRL REUS W/ TWL LRG LVL3 (GOWN DISPOSABLE) ×28 IMPLANT
GOWN STRL REUS W/ TWL XL LVL3 (GOWN DISPOSABLE) ×16 IMPLANT
GOWN STRL REUS W/TWL LRG LVL3 (GOWN DISPOSABLE) ×35
GOWN STRL REUS W/TWL XL LVL3 (GOWN DISPOSABLE) ×20
HEMOSTAT POWDER SURGIFOAM 1G (HEMOSTASIS) ×15 IMPLANT
HEMOSTAT SURGICEL 2X14 (HEMOSTASIS) ×5 IMPLANT
INSERT FOGARTY 61MM (MISCELLANEOUS) IMPLANT
INSERT FOGARTY XLG (MISCELLANEOUS) IMPLANT
KIT BASIN OR (CUSTOM PROCEDURE TRAY) ×5 IMPLANT
KIT CATH CPB BARTLE (MISCELLANEOUS) ×5 IMPLANT
KIT SUCTION CATH 14FR (SUCTIONS) ×10 IMPLANT
KIT TURNOVER KIT B (KITS) ×5 IMPLANT
KIT VASOVIEW HEMOPRO 2 VH 4000 (KITS) ×5 IMPLANT
LINE VENT (MISCELLANEOUS) ×5 IMPLANT
LOOP VESSEL SUPERMAXI WHITE (MISCELLANEOUS) ×5 IMPLANT
NS IRRIG 1000ML POUR BTL (IV SOLUTION) ×30 IMPLANT
PACK E OPEN HEART (SUTURE) ×5 IMPLANT
PACK OPEN HEART (CUSTOM PROCEDURE TRAY) ×5 IMPLANT
PAD ARMBOARD 7.5X6 YLW CONV (MISCELLANEOUS) ×10 IMPLANT
PAD ELECT DEFIB RADIOL ZOLL (MISCELLANEOUS) ×5 IMPLANT
PENCIL BUTTON HOLSTER BLD 10FT (ELECTRODE) ×5 IMPLANT
POSITIONER HEAD DONUT 9IN (MISCELLANEOUS) ×5 IMPLANT
PUNCH AORTIC ROTATE 4.0MM (MISCELLANEOUS) ×5 IMPLANT
PUNCH AORTIC ROTATE 4.5MM 8IN (MISCELLANEOUS) ×5 IMPLANT
PUNCH AORTIC ROTATE 5MM 8IN (MISCELLANEOUS) IMPLANT
RING MITRAL MEMO 4D 28 (Prosthesis & Implant Heart) ×5 IMPLANT
SET CARDIOPLEGIA MPS 5001102 (MISCELLANEOUS) ×5 IMPLANT
SPONGE INTESTINAL PEANUT (DISPOSABLE) IMPLANT
SPONGE LAP 18X18 RF (DISPOSABLE) ×10 IMPLANT
SPONGE LAP 4X18 RFD (DISPOSABLE) IMPLANT
SUPPORT HEART JANKE-BARRON (MISCELLANEOUS) ×5 IMPLANT
SUT BONE WAX W31G (SUTURE) ×5 IMPLANT
SUT ETHIBON 2 0 V 52N 30 (SUTURE) ×10 IMPLANT
SUT ETHIBON EXCEL 2-0 V-5 (SUTURE) ×5 IMPLANT
SUT ETHIBOND (SUTURE) ×10 IMPLANT
SUT ETHIBOND 2 0 SH (SUTURE) ×15 IMPLANT
SUT ETHIBOND 2 0 SH 36X2 (SUTURE) ×8 IMPLANT
SUT ETHIBOND 2 0 V4 (SUTURE) IMPLANT
SUT ETHIBOND 2 0V4 GREEN (SUTURE) IMPLANT
SUT ETHIBOND 2-0 RB-1 WHT (SUTURE) ×10 IMPLANT
SUT ETHIBOND V-5 VALVE (SUTURE) ×10 IMPLANT
SUT MNCRL AB 4-0 PS2 18 (SUTURE) IMPLANT
SUT PROLENE 3 0 SH 48 (SUTURE) ×15 IMPLANT
SUT PROLENE 3 0 SH DA (SUTURE) IMPLANT
SUT PROLENE 3 0 SH1 36 (SUTURE) ×10 IMPLANT
SUT PROLENE 4 0 RB 1 (SUTURE) ×40
SUT PROLENE 4 0 SH DA (SUTURE) IMPLANT
SUT PROLENE 4-0 RB1 .5 CRCL 36 (SUTURE) ×32 IMPLANT
SUT PROLENE 5 0 C 1 36 (SUTURE) IMPLANT
SUT PROLENE 6 0 C 1 30 (SUTURE) ×15 IMPLANT
SUT PROLENE 7 0 BV 1 (SUTURE) ×5 IMPLANT
SUT PROLENE 7 0 BV1 MDA (SUTURE) ×10 IMPLANT
SUT PROLENE 8 0 BV175 6 (SUTURE) ×5 IMPLANT
SUT SILK  1 MH (SUTURE) ×5
SUT SILK 1 MH (SUTURE) ×4 IMPLANT
SUT SILK 2 0 SH (SUTURE) IMPLANT
SUT STEEL 6MS V (SUTURE) IMPLANT
SUT STEEL STERNAL CCS#1 18IN (SUTURE) IMPLANT
SUT STEEL SZ 6 DBL 3X14 BALL (SUTURE) IMPLANT
SUT VIC AB 1 CTX 36 (SUTURE) ×10
SUT VIC AB 1 CTX36XBRD ANBCTR (SUTURE) ×8 IMPLANT
SUT VIC AB 2-0 CT1 27 (SUTURE) ×5
SUT VIC AB 2-0 CT1 TAPERPNT 27 (SUTURE) ×4 IMPLANT
SUT VIC AB 2-0 CTX 27 (SUTURE) IMPLANT
SUT VIC AB 3-0 SH 27 (SUTURE)
SUT VIC AB 3-0 SH 27X BRD (SUTURE) IMPLANT
SUT VIC AB 3-0 X1 27 (SUTURE) IMPLANT
SUT VICRYL 4-0 PS2 18IN ABS (SUTURE) ×5 IMPLANT
SYR BULB IRRIG 60ML STRL (SYRINGE) ×10 IMPLANT
SYSTEM SAHARA CHEST DRAIN ATS (WOUND CARE) ×10 IMPLANT
TAPE CLOTH SURG 4X10 WHT LF (GAUZE/BANDAGES/DRESSINGS) ×5 IMPLANT
TAPE PAPER 2X10 WHT MICROPORE (GAUZE/BANDAGES/DRESSINGS) ×5 IMPLANT
TOWEL GREEN STERILE (TOWEL DISPOSABLE) ×5 IMPLANT
TOWEL GREEN STERILE FF (TOWEL DISPOSABLE) ×5 IMPLANT
TRAY FOLEY SLVR 16FR TEMP STAT (SET/KITS/TRAYS/PACK) ×5 IMPLANT
TUBE CONNECTING 12X1/4 (SUCTIONS) ×5 IMPLANT
TUBE CONNECTING 20X1/4 (TUBING) ×5 IMPLANT
TUBING LAP HI FLOW INSUFFLATIO (TUBING) ×5 IMPLANT
UNDERPAD 30X36 HEAVY ABSORB (UNDERPADS AND DIAPERS) ×5 IMPLANT
VALVE AORTIC SZ23 INSP/RESIL (Prosthesis & Implant Heart) ×5 IMPLANT
VENT LEFT HEART 12002 (CATHETERS) ×10
VRC MALLEABLE SINGLE STG 34FR (MISCELLANEOUS) ×5
WATER STERILE IRR 1000ML POUR (IV SOLUTION) ×10 IMPLANT

## 2020-04-09 NOTE — Progress Notes (Signed)
RT note- sp02 89% upon arrival, recruitment maneuver performed, sp02 now 99%, continue to monitor.

## 2020-04-09 NOTE — Transfer of Care (Signed)
Immediate Anesthesia Transfer of Care Note  Patient: Scott Rios  Procedure(s) Performed: CORONARY ARTERY BYPASS GRAFTING (CABG) X 6 USING LIMA TO LAD; ENDOSCOPICALLY HARVESTED RIGHT GREATER SAPHENOUS VEIN: SVG to D1; SVG seq to OM2 and OM3; SVG seq to PD and PL. AORTIC VALVE REPLACEMENT USING INSPIRIS RESILIA 23 MM AORTIC VALVE. 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 LEFT ATRIAL APPENDAGE CLIPPING USING ATRICURE [id number] 40 MM CLIP. (N/A Chest) AORTIC VALVE REPLACEMENT (AVR) USING EDWARDS INSPIRIS RESILIA 23 MM AORTIC VALVE. (N/A Chest) MITRAL VALVE REPLACEMENT (MVR) USING MEMO 4D 28 MM MITRAL VALVE (N/A Chest) TRANSESOPHAGEAL ECHOCARDIOGRAM (TEE) (N/A ) ENDOVEIN HARVEST OF GREATER SAPHENOUS VEIN (Right Leg Upper) CLIPPING OF ATRIAL APPENDAGE USING  ATRICURE [id number] ATRICLIP (Left Chest)  Patient Location: PACU and ICU  Anesthesia Type:General  Level of Consciousness: sedated and Patient remains intubated per anesthesia plan  Airway & Oxygen Therapy: Patient remains intubated per anesthesia plan and Patient placed on Ventilator (see vital sign flow sheet for setting)  Post-op Assessment: Report given to RN and Post -op Vital signs reviewed and stable - Dr Laneta Simmers at bedside.   Post vital signs: Reviewed and stable - Could not get PA waveform from swan. RN and surgeon troubleshooting at bedside  Last Vitals:  Vitals Value Taken Time  BP    Temp    Pulse    Resp    SpO2      Last Pain:  Vitals:   04/09/20 0412  TempSrc: Oral  PainSc:       Patients Stated Pain Goal: 0 (04/06/20 1133)  Complications: No complications documented.

## 2020-04-09 NOTE — Progress Notes (Signed)
Patient ID: Scott Rios, male   DOB: February 16, 1956, 65 y.o.   MRN: [id number]  He has been stable overnight and ready for surgery this am. Sodium is down to 124 this am.

## 2020-04-09 NOTE — Progress Notes (Signed)
Patient off unit whole day for surgery. Has been transferred to ICU  under CTVS surgery. Please consult hospitalist service when appropriate after discharge from ICU.

## 2020-04-09 NOTE — Brief Op Note (Signed)
03/27/2020 - 04/09/2020  2:22 PM  PATIENT:  Scott Rios  64 y.o. male  PRE-OPERATIVE DIAGNOSIS:  CORONARY ARTERY DISEASE SEVERE AORTIC INSUFFICIENCY MITRAL REGURGITATION  POST-OPERATIVE DIAGNOSIS:  CORONARY ARTERY DISEASE SEVERE AORTIC INSUFFICIENCY MITRAL REGURGITATION  PROCEDURES:  CORONARY ARTERY BYPASS GRAFTING    LIMA->LAD  SVG->D1  SVG-> OM2->OM3  SVG->PD-->PL  ENDOVEIN HARVEST OF GREATER SAPHENOUS VEIN (Right)  Harvest time:        Prep time:  AORTIC VALVE REPLACEMENT   49mm Edwards Inspiris Bioprosthetic Valve  MITRAL VALVE REPAIR    61mm Sorin Memo 4D Annuloplasty Ring  TRANSESOPHAGEAL ECHOCARDIOGRAM    SURGEON:  Alleen Borne, MD - Primary  PHYSICIAN ASSISTANT: Bao Bazen  ANESTHESIA:   general  EBL:  Per anesthesia and perfusion records  BLOOD ADMINISTERED:none  DRAINS: Bilateral pleural and mediastinal tubes   LOCAL MEDICATIONS USED:  NONE  SPECIMEN:  Source of Specimen:  Calcified aortic valve leaflets  DISPOSITION OF SPECIMEN:  PATHOLOGY  COUNTS:  YES  DICTATION: .Dragon Dictation  PLAN OF CARE: Admit to inpatient   PATIENT DISPOSITION:  ICU - intubated and hemodynamically stable.   Delay start of Pharmacological VTE agent (>24hrs) due to surgical blood loss or risk of bleeding: yes

## 2020-04-09 NOTE — Progress Notes (Signed)
°  Echocardiogram Echocardiogram Transesophageal has been performed.  Celene Skeen 04/09/2020, 9:11 AM

## 2020-04-09 NOTE — Anesthesia Procedure Notes (Signed)
Central Venous Catheter Insertion Performed by: Beryle Lathe, MD, anesthesiologist Start/End8/26/2021 7:20 AM, 04/09/2020 7:21 AM Patient location: Pre-op. Preanesthetic checklist: patient identified, IV checked, risks and benefits discussed, surgical consent, monitors and equipment checked, pre-op evaluation, timeout performed and anesthesia consent Position: Trendelenburg Hand hygiene performed  and maximum sterile barriers used  Total catheter length 10. PA cath was placed.Swan type:thermodilution PA Cath depth:50 Procedure performed without using ultrasound guided technique. Attempts: 1 Patient tolerated the procedure well with no immediate complications.

## 2020-04-09 NOTE — Anesthesia Procedure Notes (Signed)
Procedure Name: Intubation Date/Time: 04/09/2020 7:51 AM Performed by: Trinna Post., CRNA Pre-anesthesia Checklist: Patient identified, Emergency Drugs available, Suction available, Patient being monitored and Timeout performed Patient Re-evaluated:Patient Re-evaluated prior to induction Oxygen Delivery Method: Circle system utilized Preoxygenation: Pre-oxygenation with 100% oxygen Induction Type: IV induction Ventilation: Mask ventilation without difficulty Laryngoscope Size: Mac and 4 Grade View: Grade I Tube type: Oral Tube size: 8.0 mm Number of attempts: 1 Airway Equipment and Method: Stylet Placement Confirmation: ETT inserted through vocal cords under direct vision,  positive ETCO2 and breath sounds checked- equal and bilateral Secured at: 22 cm Tube secured with: Tape Dental Injury: Teeth and Oropharynx as per pre-operative assessment

## 2020-04-09 NOTE — Anesthesia Procedure Notes (Signed)
Arterial Line Insertion Start/End8/26/2021 7:00 AM Performed by: Laruth Bouchard., CRNA, CRNA  Patient location: Pre-op. Preanesthetic checklist: patient identified, IV checked, site marked, risks and benefits discussed, surgical consent, monitors and equipment checked, pre-op evaluation, timeout performed and anesthesia consent Lidocaine 1% used for infiltration and patient sedated Left, radial was placed Catheter size: 20 G Hand hygiene performed  and maximum sterile barriers used   Attempts: 1 Procedure performed without using ultrasound guided technique. Following insertion, dressing applied and Biopatch. Post procedure assessment: normal  Patient tolerated the procedure well with no immediate complications.

## 2020-04-09 NOTE — Op Note (Signed)
CARDIOVASCULAR SURGERY OPERATIVE NOTE  04/09/2020  Surgeon:  Alleen Borne, MD  First Assistant: Jillyn Hidden,  PA-C   Preoperative Diagnosis:  Severe multi-vessel coronary artery disease, Severe aortic insufficiency, Severe mitral regurgitation.   Postoperative Diagnosis:  Same   Procedure:  1. Median Sternotomy 2. Extracorporeal circulation 3.   Coronary artery bypass grafting x 6   Left internal mammary artery graft to the LAD  SVG to diagonal  Sequential SVG to OM1 and OM3  Sequential SVG to PDA and PL 4.   Endoscopic vein harvest from the right leg 5.   Mitral valve repair with 28 mm Sorin, Memo 4D annuloplasty ring 6.   Clipping of left atrial appendage 7.   Aortic valve replacement using a 23 mm Edwards INSPIRIS RESILIA pericardial valve.   Anesthesia:  General Endotracheal   Clinical History/Surgical Indication:  This 64 year old poorly controlled diabetic smoker has severe three-vessel coronary disease with a reduced ejection fraction of 35 to 40%, severe aortic insufficiency, and moderate to severe mitral regurgitation.  He has never had any chest discomfort but has had some recent lower extremity edema and shortness of breath as well as exertional fatigue.  He presented with acute right lower extremity weakness and numbness this sounds a could have been a TIA or stroke although is MRI of the brain showed no evidence of acute stroke.  Carotid Dopplers showed no significant stenosis.  I think the best treatment for his cardiac disease is to proceed with coronary artery bypass graft surgery as well as aortic valve replacement and probable mitral valve repair.  His operative risk is increased due to the complexity of the surgery needed as well as his poorly controlled diabetes with a hemoglobin A1c of 10.7 and COPD with ongoing heavy smoking. I discussed the operative  procedure with the patient  including alternatives, benefits and risks; including but not limited to bleeding, blood transfusion, infection, stroke, myocardial infarction, graft failure, heart block requiring a permanent pacemaker, organ dysfunction, and death.  Scott Rios understands and agrees to proceed.   Preparation:  The patient was seen in the preoperative holding area and the correct patient, correct operation were confirmed with the patient after reviewing the medical record and catheterization. The consent was signed by me. Preoperative antibiotics were given. A pulmonary arterial line and radial arterial line were placed by the anesthesia team. The patient was taken back to the operating room and positioned supine on the operating room table. After being placed under general endotracheal anesthesia by the anesthesia team a foley catheter was placed. The neck, chest, abdomen, and both legs were prepped with betadine soap and solution and draped in the usual sterile manner. A surgical time-out was taken and the correct patient and operative procedure were confirmed with the nursing and anesthesia staff.   Cardiopulmonary Bypass:  A median sternotomy was performed. The pericardium was opened in the midline. Right ventricular function appeared normal. The ascending aorta was of normal size and had no palpable plaque. There were no contraindications to aortic cannulation or cross-clamping. The patient was fully systemically heparinized and the ACT was maintained > 400 sec. The proximal aortic arch was cannulated with a 20 F aortic cannula for arterial inflow. Bi-caval venous cannulation was performed using a 24 F metal tip right angle cannula in the SVC and a 34 F plastic malleable cannula in the IVC.  An antegrade cardioplegia/vent cannula was inserted into the mid-ascending aorta. A retrograde cardioplegia cannula was placed in the  coronary sinus via the right atrium. Aortic occlusion was  performed with a single cross-clamp. Systemic cooling to 32 degrees Centigrade and topical cooling of the heart with iced saline were used. Hyperkalemic retrograde cold blood cardioplegia was used to induce diastolic arrest and was then given at about 20 minute intervals throughout the period of arrest to maintain myocardial temperature at or below 10 degrees centigrade. A temperature probe was inserted into the interventricular septum and an insulating pad was placed in the pericardium. CO2 was insufflated into the pericardium to minimize intracardiac air.    Left internal mammary artery harvest:  The left side of the sternum was retracted using the Rultract retractor. The left internal mammary artery was harvested as a pedicle graft. All side branches were clipped. It was a medium-sized vessel of good quality with excellent blood flow. It was ligated distally and divided. It was sprayed with topical papaverine solution to prevent vasospasm.   Endoscopic vein harvest:  The right greater saphenous vein was harvested endoscopically through a 2 cm incision medial to the right knee. It was harvested from the upper thigh to the ankle.  It was a medium-sized vein of good quality. The side branches were all ligated with 4-0 silk ties.    Coronary arteries:  The coronary arteries were examined.   LAD:  Diffusely diseased and the only soft area was distally near the apex. The diagonal was large but diffusely diseased. The distal vessel was graftable.  LCX:  OM1 medium sized and mostly intramyocardial. It was located in the mid portion and was graftable. The OM3 was large with no distal disease.  RCA:  PDA was small but graftable. PL was large with no distal disease.   Grafts:  1. LIMA to the LAD: 2.0 mm. It was sewn end to side using 8-0 prolene continuous suture. 2. SVG to diagonal:  1.75 mm. It was sewn end to side using 7-0 prolene continuous suture. 3. Sequential SVG to OM1:  1.6 mm. It was  sewn sequential side to side using 7-0 prolene continuous suture. 4. Sequential SVG to OM3:  1.75 mm. It was sewn sequential end to side using 7-0 prolene continuous suture. 5.   Sequential SVG to PDA: 1.6 mm. It was sewn sequential side to side using 7-0 prolene continuous suture. 6.   Sequential SVG to PL: 1.75 mm. It was sewn sequential end to side using 7-0 prolene continuous suture.    The proximal vein graft anastomoses were performed to the mid-ascending aorta using continuous 6-0 prolene suture. Graft markers were placed around the proximal anastomoses.  Mitral Valve Repair:  The left atrium was opened through a vertical incision in the interatrial groove. Exposure was good. Valve inspection showed normal leaflets with normal subvalvular apparatus. There was no prolapse and the regurgitation appeared to be related to a loss of coaptation from annular enlargement. A series of pledgetted 2-0 Ethibond sutures were placed around the mitral annulus. The anterior leaflet was sized and a 28 mm Sorin 4D Memo annuloplasty ring was chosen. ( REF 4DM-28, SN [id number])The sutures were placed through the ring and it was lowered into place. The sutures were tied. The valve was tested with saline with complete distension of the LV and there was no regurgitation. The atrium was closed with 2 layers of continuous 3-0 prolene suture.   Aortic Valve Replacement:  A transverse aortotomy was performed 1 cm above the take-off of the right coronary artery. 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 The leaflets were thickened and did not coapt centrally. The ostia of the coronary arteries were in normal position and were not obstructed. The native valve leaflets were excised. Care was taken to remove all particulate debris. The left ventricle was directly inspected for debris and then irrigated with ice saline solution. The annulus was sized and a size 23 mm Edwards  INSPIRIS RESILIA pericardial valve was chosen. The model number was [id number] and the serial number was [id number].  While the valve was being prepared 2-0 Ethibond pledgeted horizontal mattress sutures were placed around the annulus with the pledgets in a sub-annular position. The sutures were placed through the sewing ring and the valve lowered into place. The sutures were tied sequentially. The valve seated nicely and the coronary ostia were not obstructed. The prosthetic valve leaflets moved normally and there was no sub-valvular obstruction. The aortotomy was closed using 4-0 Prolene suture in 2 layers with felt strips to reinforce the closure.  Ligation of left atrial appendage:   The base of the appendage was measured and a 40 mm Atricure Atriclip Pro was chosen. This was placed across the base of the LAA without difficulty.     Completion:  The patient was rewarmed to 37 degrees Centigrade. The clamp was removed from the LIMA pedicle and there was rapid warming of the septum and return of ventricular fibrillation. The crossclamp was removed with a time of 245 minutes. There was spontaneous return of sinus rhythm. The distal and proximal anastomoses were checked for hemostasis. The position of the grafts was satisfactory. Two temporary epicardial pacing wires were placed on the right atrium and two on the right ventricle. The patient was weaned from CPB without difficulty on low dose milrinone and epi.  CPB time was 274 minutes. Cardiac output was 7 LPM. TEE showed a normally functioning aortic valve prosthesis with no paravalvular leak. The was no MR. LV function was improved. Heparin was fully reversed with protamine and the aortic and venous cannulas removed. Hemostasis was achieved. Mediastinal and left pleural drainage tubes were placed. The sternum was closed with  #6 stainless steel wires. The fascia was closed with continuous # 1 vicryl suture. The subcutaneous tissue was closed with 2-0 vicryl  continuous suture. The skin was closed with 3-0 vicryl subcuticular suture. All sponge, needle, and instrument counts were reported correct at the end of the case. Dry sterile dressings were placed over the incisions and around the chest tubes which were connected to pleurevac suction. The patient was then transported to the surgical intensive care unit in stable condition.

## 2020-04-09 NOTE — Anesthesia Procedure Notes (Signed)
Central Venous Catheter Insertion Performed by: Audry Pili, MD, anesthesiologist Start/End8/26/2021 7:13 AM, 04/09/2020 7:21 AM Patient location: Pre-op. Preanesthetic checklist: patient identified, IV checked, risks and benefits discussed, surgical consent, monitors and equipment checked, pre-op evaluation, timeout performed and anesthesia consent Position: Trendelenburg Lidocaine 1% used for infiltration and patient sedated Hand hygiene performed , maximum sterile barriers used  and Seldinger technique used Catheter size: 8.5 Fr Central line was placed.MAC introducer Procedure performed using ultrasound guided technique. Ultrasound Notes:anatomy identified, needle tip was noted to be adjacent to the nerve/plexus identified, no ultrasound evidence of intravascular and/or intraneural injection and image(s) printed for medical record Attempts: 1 Following insertion, line sutured, dressing applied and Biopatch. Post procedure assessment: blood return through all ports, no air and free fluid flow  Patient tolerated the procedure well with no immediate complications.

## 2020-04-10 ENCOUNTER — Inpatient Hospital Stay (HOSPITAL_COMMUNITY): Payer: Medicare Other

## 2020-04-10 ENCOUNTER — Encounter (HOSPITAL_COMMUNITY): Payer: Self-pay | Admitting: Surgery

## 2020-04-10 DIAGNOSIS — E440 Moderate protein-calorie malnutrition: Secondary | ICD-10-CM | POA: Insufficient documentation

## 2020-04-10 LAB — POCT I-STAT, CHEM 8
BUN: 12 mg/dL (ref 8–23)
BUN: 12 mg/dL (ref 8–23)
BUN: 10 mg/dL (ref 8–23)
BUN: 11 mg/dL (ref 8–23)
BUN: 11 mg/dL (ref 8–23)
BUN: 11 mg/dL (ref 8–23)
BUN: 12 mg/dL (ref 8–23)
BUN: 11 mg/dL (ref 8–23)
Calcium, Ion: 1.27 mmol/L (ref 1.15–1.40)
Calcium, Ion: 1.29 mmol/L (ref 1.15–1.40)
Calcium, Ion: 1.02 mmol/L — ABNORMAL LOW (ref 1.15–1.40)
Calcium, Ion: 1.17 mmol/L (ref 1.15–1.40)
Calcium, Ion: 1.07 mmol/L — ABNORMAL LOW (ref 1.15–1.40)
Calcium, Ion: 1.15 mmol/L (ref 1.15–1.40)
Calcium, Ion: 1.21 mmol/L (ref 1.15–1.40)
Calcium, Ion: 1.1 mmol/L — ABNORMAL LOW (ref 1.15–1.40)
Chloride: 92 mmol/L — ABNORMAL LOW (ref 98–111)
Chloride: 93 mmol/L — ABNORMAL LOW (ref 98–111)
Chloride: 92 mmol/L — ABNORMAL LOW (ref 98–111)
Chloride: 93 mmol/L — ABNORMAL LOW (ref 98–111)
Chloride: 94 mmol/L — ABNORMAL LOW (ref 98–111)
Chloride: 94 mmol/L — ABNORMAL LOW (ref 98–111)
Chloride: 95 mmol/L — ABNORMAL LOW (ref 98–111)
Chloride: 96 mmol/L — ABNORMAL LOW (ref 98–111)
Creatinine, Ser: 0.5 mg/dL — ABNORMAL LOW (ref 0.61–1.24)
Creatinine, Ser: 0.4 mg/dL — ABNORMAL LOW (ref 0.61–1.24)
Creatinine, Ser: 0.4 mg/dL — ABNORMAL LOW (ref 0.61–1.24)
Creatinine, Ser: 0.4 mg/dL — ABNORMAL LOW (ref 0.61–1.24)
Creatinine, Ser: 0.4 mg/dL — ABNORMAL LOW (ref 0.61–1.24)
Creatinine, Ser: 0.4 mg/dL — ABNORMAL LOW (ref 0.61–1.24)
Creatinine, Ser: 0.4 mg/dL — ABNORMAL LOW (ref 0.61–1.24)
Creatinine, Ser: 0.5 mg/dL — ABNORMAL LOW (ref 0.61–1.24)
Glucose, Bld: 141 mg/dL — ABNORMAL HIGH (ref 70–99)
Glucose, Bld: 80 mg/dL (ref 70–99)
Glucose, Bld: 118 mg/dL — ABNORMAL HIGH (ref 70–99)
Glucose, Bld: 63 mg/dL — ABNORMAL LOW (ref 70–99)
Glucose, Bld: 120 mg/dL — ABNORMAL HIGH (ref 70–99)
Glucose, Bld: 108 mg/dL — ABNORMAL HIGH (ref 70–99)
Glucose, Bld: 117 mg/dL — ABNORMAL HIGH (ref 70–99)
Glucose, Bld: 102 mg/dL — ABNORMAL HIGH (ref 70–99)
HCT: 32 % — ABNORMAL LOW (ref 39.0–52.0)
HCT: 33 % — ABNORMAL LOW (ref 39.0–52.0)
HCT: 25 % — ABNORMAL LOW (ref 39.0–52.0)
HCT: 26 % — ABNORMAL LOW (ref 39.0–52.0)
HCT: 28 % — ABNORMAL LOW (ref 39.0–52.0)
HCT: 25 % — ABNORMAL LOW (ref 39.0–52.0)
HCT: 25 % — ABNORMAL LOW (ref 39.0–52.0)
HCT: 22 % — ABNORMAL LOW (ref 39.0–52.0)
Hemoglobin: 10.9 g/dL — ABNORMAL LOW (ref 13.0–17.0)
Hemoglobin: 11.2 g/dL — ABNORMAL LOW (ref 13.0–17.0)
Hemoglobin: 8.5 g/dL — ABNORMAL LOW (ref 13.0–17.0)
Hemoglobin: 8.8 g/dL — ABNORMAL LOW (ref 13.0–17.0)
Hemoglobin: 9.5 g/dL — ABNORMAL LOW (ref 13.0–17.0)
Hemoglobin: 8.5 g/dL — ABNORMAL LOW (ref 13.0–17.0)
Hemoglobin: 8.5 g/dL — ABNORMAL LOW (ref 13.0–17.0)
Hemoglobin: 7.5 g/dL — ABNORMAL LOW (ref 13.0–17.0)
Potassium: 4.3 mmol/L (ref 3.5–5.1)
Potassium: 4 mmol/L (ref 3.5–5.1)
Potassium: 3.9 mmol/L (ref 3.5–5.1)
Potassium: 4.3 mmol/L (ref 3.5–5.1)
Potassium: 4 mmol/L (ref 3.5–5.1)
Potassium: 3.9 mmol/L (ref 3.5–5.1)
Potassium: 4.8 mmol/L (ref 3.5–5.1)
Potassium: 3.6 mmol/L (ref 3.5–5.1)
Sodium: 128 mmol/L — ABNORMAL LOW (ref 135–145)
Sodium: 130 mmol/L — ABNORMAL LOW (ref 135–145)
Sodium: 129 mmol/L — ABNORMAL LOW (ref 135–145)
Sodium: 134 mmol/L — ABNORMAL LOW (ref 135–145)
Sodium: 134 mmol/L — ABNORMAL LOW (ref 135–145)
Sodium: 131 mmol/L — ABNORMAL LOW (ref 135–145)
Sodium: 132 mmol/L — ABNORMAL LOW (ref 135–145)
Sodium: 133 mmol/L — ABNORMAL LOW (ref 135–145)
TCO2: 27 mmol/L (ref 22–32)
TCO2: 26 mmol/L (ref 22–32)
TCO2: 27 mmol/L (ref 22–32)
TCO2: 28 mmol/L (ref 22–32)
TCO2: 28 mmol/L (ref 22–32)
TCO2: 27 mmol/L (ref 22–32)
TCO2: 29 mmol/L (ref 22–32)
TCO2: 24 mmol/L (ref 22–32)

## 2020-04-10 LAB — BASIC METABOLIC PANEL
Anion gap: 9 (ref 5–15)
BUN: 10 mg/dL (ref 8–23)
CO2: 21 mmol/L — ABNORMAL LOW (ref 22–32)
Calcium: 7.9 mg/dL — ABNORMAL LOW (ref 8.9–10.3)
Chloride: 102 mmol/L (ref 98–111)
Creatinine, Ser: 0.58 mg/dL — ABNORMAL LOW (ref 0.61–1.24)
GFR calc Af Amer: >60 mL/min (ref >60)
GFR calc non Af Amer: >60 mL/min (ref >60)
Glucose, Bld: 121 mg/dL — ABNORMAL HIGH (ref 70–99)
Potassium: 4.3 mmol/L (ref 3.5–5.1)
Sodium: 132 mmol/L — ABNORMAL LOW (ref 135–145)

## 2020-04-10 LAB — PREPARE PLATELET PHERESIS: Unit division: 0

## 2020-04-10 LAB — CBC
HCT: 19 % — ABNORMAL LOW (ref 39.0–52.0)
HCT: 23.5 % — ABNORMAL LOW (ref 39.0–52.0)
Hemoglobin: 6.4 g/dL — CL (ref 13.0–17.0)
Hemoglobin: 7.9 g/dL — ABNORMAL LOW (ref 13.0–17.0)
MCH: 29.5 pg (ref 26.0–34.0)
MCH: 30.4 pg (ref 26.0–34.0)
MCHC: 33.7 g/dL (ref 30.0–36.0)
MCHC: 33.6 g/dL (ref 30.0–36.0)
MCV: 87.6 fL (ref 80.0–100.0)
MCV: 90.4 fL (ref 80.0–100.0)
Platelets: 184 10*3/uL (ref 150–400)
Platelets: 155 10*3/uL (ref 150–400)
RBC: 2.17 MIL/uL — ABNORMAL LOW (ref 4.22–5.81)
RBC: 2.6 MIL/uL — ABNORMAL LOW (ref 4.22–5.81)
RDW: 13.7 % (ref 11.5–15.5)
RDW: 14.6 % (ref 11.5–15.5)
WBC: 15.2 10*3/uL — ABNORMAL HIGH (ref 4.0–10.5)
WBC: 12.8 10*3/uL — ABNORMAL HIGH (ref 4.0–10.5)
nRBC: 0 % (ref 0.0–0.2)
nRBC: 0 % (ref 0.0–0.2)

## 2020-04-10 LAB — POCT I-STAT 7, (LYTES, BLD GAS, ICA,H+H)
Acid-Base Excess: 0 mmol/L (ref 0.0–2.0)
Acid-Base Excess: 3 mmol/L — ABNORMAL HIGH (ref 0.0–2.0)
Acid-Base Excess: 3 mmol/L — ABNORMAL HIGH (ref 0.0–2.0)
Acid-Base Excess: 4 mmol/L — ABNORMAL HIGH (ref 0.0–2.0)
Acid-Base Excess: 5 mmol/L — ABNORMAL HIGH (ref 0.0–2.0)
Acid-Base Excess: 5 mmol/L — ABNORMAL HIGH (ref 0.0–2.0)
Acid-base deficit: 1 mmol/L (ref 0.0–2.0)
Acid-base deficit: 1 mmol/L (ref 0.0–2.0)
Acid-base deficit: 2 mmol/L (ref 0.0–2.0)
Acid-base deficit: 2 mmol/L (ref 0.0–2.0)
Bicarbonate: 22 mmol/L (ref 20.0–28.0)
Bicarbonate: 23.9 mmol/L (ref 20.0–28.0)
Bicarbonate: 24.2 mmol/L (ref 20.0–28.0)
Bicarbonate: 24.3 mmol/L (ref 20.0–28.0)
Bicarbonate: 25.9 mmol/L (ref 20.0–28.0)
Bicarbonate: 27.2 mmol/L (ref 20.0–28.0)
Bicarbonate: 27.3 mmol/L (ref 20.0–28.0)
Bicarbonate: 27.4 mmol/L (ref 20.0–28.0)
Bicarbonate: 28.4 mmol/L — ABNORMAL HIGH (ref 20.0–28.0)
Bicarbonate: 30.3 mmol/L — ABNORMAL HIGH (ref 20.0–28.0)
Calcium, Ion: 1.03 mmol/L — ABNORMAL LOW (ref 1.15–1.40)
Calcium, Ion: 1.07 mmol/L — ABNORMAL LOW (ref 1.15–1.40)
Calcium, Ion: 1.08 mmol/L — ABNORMAL LOW (ref 1.15–1.40)
Calcium, Ion: 1.12 mmol/L — ABNORMAL LOW (ref 1.15–1.40)
Calcium, Ion: 1.13 mmol/L — ABNORMAL LOW (ref 1.15–1.40)
Calcium, Ion: 1.14 mmol/L — ABNORMAL LOW (ref 1.15–1.40)
Calcium, Ion: 1.15 mmol/L (ref 1.15–1.40)
Calcium, Ion: 1.17 mmol/L (ref 1.15–1.40)
Calcium, Ion: 1.18 mmol/L (ref 1.15–1.40)
Calcium, Ion: 1.25 mmol/L (ref 1.15–1.40)
HCT: 19 % — ABNORMAL LOW (ref 39.0–52.0)
HCT: 24 % — ABNORMAL LOW (ref 39.0–52.0)
HCT: 25 % — ABNORMAL LOW (ref 39.0–52.0)
HCT: 25 % — ABNORMAL LOW (ref 39.0–52.0)
HCT: 26 % — ABNORMAL LOW (ref 39.0–52.0)
HCT: 26 % — ABNORMAL LOW (ref 39.0–52.0)
HCT: 27 % — ABNORMAL LOW (ref 39.0–52.0)
HCT: 27 % — ABNORMAL LOW (ref 39.0–52.0)
HCT: 27 % — ABNORMAL LOW (ref 39.0–52.0)
HCT: 33 % — ABNORMAL LOW (ref 39.0–52.0)
Hemoglobin: 11.2 g/dL — ABNORMAL LOW (ref 13.0–17.0)
Hemoglobin: 6.5 g/dL — CL (ref 13.0–17.0)
Hemoglobin: 8.2 g/dL — ABNORMAL LOW (ref 13.0–17.0)
Hemoglobin: 8.5 g/dL — ABNORMAL LOW (ref 13.0–17.0)
Hemoglobin: 8.5 g/dL — ABNORMAL LOW (ref 13.0–17.0)
Hemoglobin: 8.8 g/dL — ABNORMAL LOW (ref 13.0–17.0)
Hemoglobin: 8.8 g/dL — ABNORMAL LOW (ref 13.0–17.0)
Hemoglobin: 9.2 g/dL — ABNORMAL LOW (ref 13.0–17.0)
Hemoglobin: 9.2 g/dL — ABNORMAL LOW (ref 13.0–17.0)
Hemoglobin: 9.2 g/dL — ABNORMAL LOW (ref 13.0–17.0)
O2 Saturation: 100 %
O2 Saturation: 100 %
O2 Saturation: 100 %
O2 Saturation: 100 %
O2 Saturation: 100 %
O2 Saturation: 100 %
O2 Saturation: 94 %
O2 Saturation: 99 %
O2 Saturation: 99 %
O2 Saturation: 99 %
Patient temperature: 35.9
Patient temperature: 36
Patient temperature: 36.8
Potassium: 3.5 mmol/L (ref 3.5–5.1)
Potassium: 3.7 mmol/L (ref 3.5–5.1)
Potassium: 4 mmol/L (ref 3.5–5.1)
Potassium: 4 mmol/L (ref 3.5–5.1)
Potassium: 4.1 mmol/L (ref 3.5–5.1)
Potassium: 4.2 mmol/L (ref 3.5–5.1)
Potassium: 4.3 mmol/L (ref 3.5–5.1)
Potassium: 4.3 mmol/L (ref 3.5–5.1)
Potassium: 4.5 mmol/L (ref 3.5–5.1)
Potassium: 4.7 mmol/L (ref 3.5–5.1)
Sodium: 128 mmol/L — ABNORMAL LOW (ref 135–145)
Sodium: 130 mmol/L — ABNORMAL LOW (ref 135–145)
Sodium: 132 mmol/L — ABNORMAL LOW (ref 135–145)
Sodium: 132 mmol/L — ABNORMAL LOW (ref 135–145)
Sodium: 132 mmol/L — ABNORMAL LOW (ref 135–145)
Sodium: 133 mmol/L — ABNORMAL LOW (ref 135–145)
Sodium: 134 mmol/L — ABNORMAL LOW (ref 135–145)
Sodium: 134 mmol/L — ABNORMAL LOW (ref 135–145)
Sodium: 134 mmol/L — ABNORMAL LOW (ref 135–145)
Sodium: 135 mmol/L (ref 135–145)
TCO2: 23 mmol/L (ref 22–32)
TCO2: 25 mmol/L (ref 22–32)
TCO2: 26 mmol/L (ref 22–32)
TCO2: 26 mmol/L (ref 22–32)
TCO2: 27 mmol/L (ref 22–32)
TCO2: 28 mmol/L (ref 22–32)
TCO2: 28 mmol/L (ref 22–32)
TCO2: 29 mmol/L (ref 22–32)
TCO2: 29 mmol/L (ref 22–32)
TCO2: 32 mmol/L (ref 22–32)
pCO2 arterial: 34.2 mmHg (ref 32.0–48.0)
pCO2 arterial: 34.4 mmHg (ref 32.0–48.0)
pCO2 arterial: 35 mmHg (ref 32.0–48.0)
pCO2 arterial: 36.8 mmHg (ref 32.0–48.0)
pCO2 arterial: 38.6 mmHg (ref 32.0–48.0)
pCO2 arterial: 40.2 mmHg (ref 32.0–48.0)
pCO2 arterial: 41.1 mmHg (ref 32.0–48.0)
pCO2 arterial: 45.2 mmHg (ref 32.0–48.0)
pCO2 arterial: 45.4 mmHg (ref 32.0–48.0)
pCO2 arterial: 47.6 mmHg (ref 32.0–48.0)
pH, Arterial: 7.334 — ABNORMAL LOW (ref 7.350–7.450)
pH, Arterial: 7.365 (ref 7.350–7.450)
pH, Arterial: 7.38 (ref 7.350–7.450)
pH, Arterial: 7.412 (ref 7.350–7.450)
pH, Arterial: 7.412 (ref 7.350–7.450)
pH, Arterial: 7.415 (ref 7.350–7.450)
pH, Arterial: 7.441 (ref 7.350–7.450)
pH, Arterial: 7.458 — ABNORMAL HIGH (ref 7.350–7.450)
pH, Arterial: 7.506 — ABNORMAL HIGH (ref 7.350–7.450)
pH, Arterial: 7.518 — ABNORMAL HIGH (ref 7.350–7.450)
pO2, Arterial: 116 mmHg — ABNORMAL HIGH (ref 83.0–108.0)
pO2, Arterial: 137 mmHg — ABNORMAL HIGH (ref 83.0–108.0)
pO2, Arterial: 159 mmHg — ABNORMAL HIGH (ref 83.0–108.0)
pO2, Arterial: 313 mmHg — ABNORMAL HIGH (ref 83.0–108.0)
pO2, Arterial: 314 mmHg — ABNORMAL HIGH (ref 83.0–108.0)
pO2, Arterial: 355 mmHg — ABNORMAL HIGH (ref 83.0–108.0)
pO2, Arterial: 379 mmHg — ABNORMAL HIGH (ref 83.0–108.0)
pO2, Arterial: 390 mmHg — ABNORMAL HIGH (ref 83.0–108.0)
pO2, Arterial: 393 mmHg — ABNORMAL HIGH (ref 83.0–108.0)
pO2, Arterial: 74 mmHg — ABNORMAL LOW (ref 83.0–108.0)

## 2020-04-10 LAB — PREPARE FRESH FROZEN PLASMA
Unit division: 0
Unit division: 0

## 2020-04-10 LAB — BPAM FFP
Blood Product Expiration Date: 202108302359
Blood Product Expiration Date: 202108302359
ISSUE DATE / TIME: 202108261752
ISSUE DATE / TIME: 202108261752
Unit Type and Rh: 6200
Unit Type and Rh: 6200

## 2020-04-10 LAB — GLUCOSE, CAPILLARY
Glucose-Capillary: 102 mg/dL — ABNORMAL HIGH (ref 70–99)
Glucose-Capillary: 107 mg/dL — ABNORMAL HIGH (ref 70–99)
Glucose-Capillary: 111 mg/dL — ABNORMAL HIGH (ref 70–99)
Glucose-Capillary: 111 mg/dL — ABNORMAL HIGH (ref 70–99)
Glucose-Capillary: 112 mg/dL — ABNORMAL HIGH (ref 70–99)
Glucose-Capillary: 120 mg/dL — ABNORMAL HIGH (ref 70–99)
Glucose-Capillary: 123 mg/dL — ABNORMAL HIGH (ref 70–99)
Glucose-Capillary: 126 mg/dL — ABNORMAL HIGH (ref 70–99)
Glucose-Capillary: 134 mg/dL — ABNORMAL HIGH (ref 70–99)
Glucose-Capillary: 137 mg/dL — ABNORMAL HIGH (ref 70–99)
Glucose-Capillary: 161 mg/dL — ABNORMAL HIGH (ref 70–99)

## 2020-04-10 LAB — PREPARE RBC (CROSSMATCH)

## 2020-04-10 LAB — BPAM PLATELET PHERESIS
Blood Product Expiration Date: 202108262359
ISSUE DATE / TIME: 202108262030
Unit Type and Rh: 6200

## 2020-04-10 LAB — MAGNESIUM: Magnesium: 2.3 mg/dL (ref 1.7–2.4)

## 2020-04-10 MED ORDER — PROSOURCE PLUS PO LIQD
30.0000 mL | Freq: Two times a day (BID) | ORAL | Status: DC
  Administered 2020-04-11 – 2020-04-15 (×7): 30 mL via ORAL
  Filled 2020-04-10 (×7): qty 30

## 2020-04-10 MED ORDER — INSULIN DETEMIR 100 UNIT/ML ~~LOC~~ SOLN: 20.0000 [IU] | Freq: Every day | SUBCUTANEOUS | Status: DC

## 2020-04-10 MED ORDER — CHLORHEXIDINE GLUCONATE 0.12 % MT SOLN
15.0000 mL | Freq: Two times a day (BID) | OROMUCOSAL | Status: DC
  Administered 2020-04-10 – 2020-04-17 (×14): 15 mL via OROMUCOSAL
  Filled 2020-04-10 (×15): qty 15

## 2020-04-10 MED ORDER — INSULIN DETEMIR 100 UNIT/ML ~~LOC~~ SOLN
20.0000 [IU] | Freq: Every day | SUBCUTANEOUS | Status: DC
  Administered 2020-04-10 – 2020-04-12 (×3): 20 [IU] via SUBCUTANEOUS
  Filled 2020-04-10 (×4): qty 0.2

## 2020-04-10 MED ORDER — ENOXAPARIN SODIUM 40 MG/0.4ML ~~LOC~~ SOLN
40.0000 mg | Freq: Every day | SUBCUTANEOUS | Status: DC
  Administered 2020-04-10 – 2020-04-11 (×2): 40 mg via SUBCUTANEOUS
  Filled 2020-04-10 (×2): qty 0.4

## 2020-04-10 MED ORDER — ORAL CARE MOUTH RINSE
15.0000 mL | Freq: Two times a day (BID) | OROMUCOSAL | Status: DC
  Administered 2020-04-10 – 2020-04-13 (×6): 15 mL via OROMUCOSAL

## 2020-04-10 MED ORDER — SODIUM CHLORIDE 0.9% IV SOLUTION: Freq: Once | INTRAVENOUS | Status: AC

## 2020-04-10 MED ORDER — FE FUMARATE-B12-VIT C-FA-IFC PO CAPS
1.0000 | ORAL_CAPSULE | Freq: Two times a day (BID) | ORAL | Status: DC
  Administered 2020-04-11 – 2020-04-18 (×13): 1 via ORAL
  Filled 2020-04-10 (×15): qty 1

## 2020-04-10 MED ORDER — INSULIN ASPART 100 UNIT/ML ~~LOC~~ SOLN
0.0000 [IU] | SUBCUTANEOUS | Status: DC
  Administered 2020-04-10: 2 [IU] via SUBCUTANEOUS
  Administered 2020-04-11: 4 [IU] via SUBCUTANEOUS
  Administered 2020-04-11: 8 [IU] via SUBCUTANEOUS
  Administered 2020-04-11: 2 [IU] via SUBCUTANEOUS
  Administered 2020-04-12 (×3): 4 [IU] via SUBCUTANEOUS

## 2020-04-10 MED ORDER — FUROSEMIDE 10 MG/ML IJ SOLN
40.0000 mg | Freq: Two times a day (BID) | INTRAMUSCULAR | Status: AC
  Administered 2020-04-10 (×2): 40 mg via INTRAVENOUS
  Filled 2020-04-10 (×2): qty 4

## 2020-04-10 MED ORDER — CHLORHEXIDINE GLUCONATE CLOTH 2 % EX PADS
6.0000 | MEDICATED_PAD | Freq: Every day | CUTANEOUS | Status: DC
  Administered 2020-04-10 – 2020-04-13 (×4): 6 via TOPICAL

## 2020-04-10 MED ORDER — GLUCERNA SHAKE PO LIQD
237.0000 mL | Freq: Two times a day (BID) | ORAL | Status: DC
  Administered 2020-04-11 – 2020-04-15 (×9): 237 mL via ORAL

## 2020-04-10 MED ORDER — SODIUM CHLORIDE 0.9% IV SOLUTION: Freq: Once | INTRAVENOUS | Status: DC

## 2020-04-10 MED ORDER — TRAMADOL HCL 50 MG PO TABS
50.0000 mg | ORAL_TABLET | ORAL | Status: DC | PRN
  Administered 2020-04-13 – 2020-04-14 (×2): 50 mg via ORAL
  Filled 2020-04-10 (×2): qty 1

## 2020-04-10 MED FILL — Thrombin (Recombinant) For Soln 20000 Unit: CUTANEOUS | Qty: 1 | Status: AC

## 2020-04-10 NOTE — Progress Notes (Signed)
1 Day Post-Op Procedure(s) (LRB): CORONARY ARTERY BYPASS GRAFTING (CABG) X 6 USING LIMA TO LAD; ENDOSCOPICALLY HARVESTED RIGHT GREATER SAPHENOUS VEIN: SVG to D1; SVG seq to OM2 and OM3; SVG seq to PD and PL. AORTIC VALVE REPLACEMENT USING INSPIRIS RESILIA 23 MM AORTIC VALVE. 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 LEFT ATRIAL APPENDAGE CLIPPING USING ATRICURE PRO240 40 MM CLIP. (N/A) AORTIC VALVE REPLACEMENT (AVR) USING EDWARDS INSPIRIS RESILIA 23 MM AORTIC VALVE. (N/A) MITRAL VALVE REPLACEMENT (MVR) USING MEMO 4D 28 MM MITRAL VALVE (N/A) TRANSESOPHAGEAL ECHOCARDIOGRAM (TEE) (N/A) ENDOVEIN HARVEST OF GREATER SAPHENOUS VEIN (Right) CLIPPING OF ATRIAL APPENDAGE USING  ATRICURE [id number] ATRICLIP (Left) Subjective: No specific complaints.  Objective: Vital signs in last 24 hours: Temp:  [96.4 F (35.8 C)-98.2 F (36.8 C)] 97.9 F (36.6 C) (08/27 0655) Pulse Rate:  [87-92] 89 (08/27 0655) Cardiac Rhythm: Atrial paced (08/26 2000) Resp:  [0-33] 33 (08/27 0655) BP: (85-141)/(44-82) 100/64 (08/27 0600) SpO2:  [96 %-100 %] 99 % (08/27 0655) Arterial Line BP: (88-187)/(45-68) 121/52 (08/27 0655) FiO2 (%):  [40 %-50 %] 40 % (08/26 2349)  Hemodynamic parameters for last 24 hours: PAP: (3-12)/(0-7) 7/3 CVP:  [0 mmHg-10 mmHg] 5 mmHg PCWP:  [4 mmHg-5 mmHg] 4 mmHg CO:  [4.7 L/min-8.3 L/min] 6.4 L/min CI:  [2.7 L/min/m2-4.7 L/min/m2] 3.7 L/min/m2  Intake/Output from previous day: 08/26 0701 - 08/27 0700 In: 6675.8 [I.V.:3740.2; Blood:1597; IV Piggyback:1338.6] Out: 5690 [Urine:2830; Blood:800; Chest Tube:2060] Intake/Output this shift: No intake/output data recorded.  General appearance: alert and cooperative Neurologic: intact Heart: regular rate and rhythm, S1, S2 normal, no murmur Lungs: clear to auscultation bilaterally Extremities: edema mild Wound: dressings dry  Lab Results: Recent Labs    04/09/20 2212 04/10/20 0015 04/10/20 0156 04/10/20 0249  WBC 19.5*  --   --   15.2*  HGB 7.7*   < > 8.2* 6.4*  HCT 22.3*   < > 24.0* 19.0*  PLT 200  --   --  184   < > = values in this interval not displayed.   BMET:  Recent Labs    04/09/20 2212 04/10/20 0015 04/10/20 0156 04/10/20 0249  NA 132*   < > 134* 132*  K 4.5   < > 4.2 4.3  CL 102  --   --  102  CO2 21*  --   --  21*  GLUCOSE 143*  --   --  121*  BUN 12  --   --  10  CREATININE 0.56*  --   --  0.58*  CALCIUM 7.7*  --   --  7.9*   < > = values in this interval not displayed.    PT/INR:  Recent Labs    04/09/20 2212  LABPROT 16.4*  INR 1.4*   ABG    Component Value Date/Time   PHART 7.412 04/10/2020 0156   HCO3 22.0 04/10/2020 0156   TCO2 23 04/10/2020 0156   ACIDBASEDEF 2.0 04/10/2020 0156   O2SAT 99.0 04/10/2020 0156   CBG (last 3)  Recent Labs    04/10/20 0144 04/10/20 0423 04/10/20 0625  GLUCAP 137* 111* 120*   CXR: left basilar atelectasis  ECG: sinus 70's, no acute changes.  Assessment/Plan: S/P Procedure(s) (LRB): CORONARY ARTERY BYPASS GRAFTING (CABG) X 6 USING LIMA TO LAD; ENDOSCOPICALLY HARVESTED RIGHT GREATER SAPHENOUS VEIN: SVG to D1; SVG seq to OM2 and OM3; SVG seq to PD and PL. AORTIC VALVE REPLACEMENT USING INSPIRIS RESILIA 23 MM AORTIC VALVE. MITRAL VALVE REPLACEMENT  USING MEMO 4D 28 MM RING. LEFT ATRIAL APPENDAGE CLIPPING USING ATRICURE PRO240 40 MM CLIP. (N/A) AORTIC VALVE REPLACEMENT (AVR) USING EDWARDS INSPIRIS RESILIA 23 MM AORTIC VALVE. (N/A) MITRAL VALVE REPLACEMENT (MVR) USING MEMO 4D 28 MM MITRAL VALVE (N/A) TRANSESOPHAGEAL ECHOCARDIOGRAM (TEE) (N/A) ENDOVEIN HARVEST OF GREATER SAPHENOUS VEIN (Right) CLIPPING OF ATRIAL APPENDAGE USING  ATRICURE [id number] ATRICLIP (Left)  POD 1  Hemodynamically stable on low dose milrinone and neo. Will DC milrinone. Rhythm this am was sinus 70's but then developed junctional rhythm 50's on monitor. Will hold off on Lopressor for now and AAI pace 80.  Acute postop blood loss anemia: partly due to blood loss and partly  dilution from albumin and RL. Will transfuse 2 units this am and diurese. Start iron.  Volume excess: did not weigh this am due to anemia and pressors. He is probably 10-15 lbs up. Start diuresis and will follow up wt tomorrow.  Preop hyponatremia: this was felt to be due to diuretics and he was down to 124 preop. If sodium drops again would try Tolvaptan.  Keep chest tubes in today since output moderate but thin. He had large bilateral effusions preop from heart failure.  DM: poorly controlled preop with Hgb A1c of 10. Start Levemir and SSI.  Recent extractions per dentistry: observe.  Deconditoning and debility due to spine disease with right leg weakness on presentation that improved. He has severe spinal stenosis. Will consult PT/OT and may need CIR.  Nurse reports some coughing with liquids postop. Continue ice chips and observe. May be due to TEE probe and intubation. If persists will get ST consult.  Plan Coumadin in a few days for MV repair but would wait until tubes out and Hgb stable.   LOS: 13 days    Scott Rios 04/10/2020

## 2020-04-10 NOTE — Hospital Course (Addendum)
Admitting Diagnoses:  Right leg weakness Flash pulmonary edema Acute respiratory failure Tobacco abuse Past history of alcohol abuse  Discharge Diagnoses:  Multivessel coronary artery disease Severe aortic stenosis with insufficiency Mitral valve insufficiency Status post coronary bypass grafting x6 Status post aortic valve replacement Status post mitral valve annuloplasty Acute pulmonary edema Acute systolic heart failure History of depression Postoperative atrial fibrillation Hyperglycemia    Diagnostics:  RIGHT/LEFT HEART CATH AND CORONARY ANGIOGRAPHY  Conclusion    Prox RCA to Mid RCA lesion is 80% stenosed. RPDA lesion is 90% stenosed. 1st Mrg lesion is 100% stenosed. 3rd Mrg lesion is 75% stenosed. Prox Cx to Mid Cx lesion is 75% stenosed. Mid LAD lesion is 100% stenosed. Mid LAD to Dist LAD lesion is 70% stenosed. 2nd Diag lesion is 40% stenosed. Prox LAD to Mid LAD lesion is 25% stenosed. LV end diastolic pressure is normal. There is moderate (3+) aortic regurgitation. There is mild left ventricular systolic dysfunction. The left ventricular ejection fraction is 35-45% by visual estimate. Right heart results on room air: Ao sat 90%, PA sat 57%, PA pressure 28/12, mean PA 18 mm Hg; mean PCWP 17 mm Hg; CO 4.1 L/min; CI 2.33   Severe three vessel disease.  Moderate to severe aortic insufficiency.   Plan for TEE tomorrow.    Will need cardiac surgery consult for CABG and likely AVR.   Results conveyed to his son, Scott Rios.   Surgeon Notes    04/09/2020  5:27 PM Op Note signed by Alleen Borne, MD    04/09/2020  4:23 PM Operative Note - Scan signed by Default, Provider, MD    03/31/2020 10:23 AM CV Procedure signed by Jodelle Red, MD  Indications  Acute systolic heart failure (HCC) [I50.21 (ICD-10-CM)]  Nonrheumatic aortic valve insufficiency [I35.1 (ICD-10-CM)]  Procedural Details  Technical Details The risks, benefits, and details of the  procedure were explained to the patient.  The patient verbalized understanding and wanted to proceed.  Informed written consent was obtained.  PROCEDURE TECHNIQUE:  After Xylocaine anesthesia, a 5 French sheath was placed in the right antecubital area in exchange for a peripheral vein. A 5 French balloontipped Swan-Ganz catheter was advanced to the pulmonary artery under fluoroscopic guidance. Hemodynamic pressures were obtained. Oxygen saturations were obtained. After Xylocaine anesthesia, a 26F sheath was placed in the right radial artery with a single anterior needle wall stick.   Left coronary angiography was done using a Judkins L4 guide catheter.  Left heart cath and Right coronary angiography was done using a Judkins R4 guide catheter.   Supravalvular aortography was done using a pigtail catheter and power injection.      Contrast: 80 cc  Estimated blood loss <50 mL.   During this procedure medications were administered to achieve and maintain moderate conscious sedation while the patient's heart rate, blood pressure, and oxygen saturation were continuously monitored and I was present face-to-face 100% of this time.  Medications (Filter: Administrations occurring from 1430 to 1528 on 03/30/20)  (important)  Continuous medications are totaled by the amount administered until 03/30/20 1528.  midazolam (VERSED) injection (mg) Total dose:  3 mg  Date/Time  Rate/Dose/Volume Action  03/30/20 1444  2 mg Given  1502  1 mg Given    fentaNYL (SUBLIMAZE) injection (mcg) Total dose:  50 mcg  Date/Time  Rate/Dose/Volume Action  03/30/20 1445  25 mcg Given  1502  25 mcg Given    Heparin (Porcine) in NaCl 1000-0.9 UT/500ML-% SOLN (mL) Total volume:  1,000 mL  Date/Time  Rate/Dose/Volume Action  03/30/20 1445  500 mL Given  1445  500 mL Given    lidocaine (PF) (XYLOCAINE) 1 % injection (mL) Total volume:  2 mL  Date/Time  Rate/Dose/Volume Action  03/30/20 1449  2 mL Given    Radial  Cocktail/Verapamil only (mL) Total volume:  10 mL  Date/Time  Rate/Dose/Volume Action  03/30/20 1452  10 mL Given    heparin sodium (porcine) injection (Units) Total dose:  3,500 Units  Date/Time  Rate/Dose/Volume Action  03/30/20 1500  3,500 Units Given    iohexol (OMNIPAQUE) 350 MG/ML injection (mL) Total volume:  80 mL  Date/Time  Rate/Dose/Volume Action  03/30/20 1512  80 mL Given    atorvastatin (LIPITOR) tablet 80 mg (mg) Total dose:  Cannot be calculated* Dosing weight:  66.9  *Administration dose not documented Date/Time  Rate/Dose/Volume Action  03/30/20 1439  *Not included in total MAR Hold    cyanocobalamin ((VITAMIN B-12)) injection 1,000 mcg (mcg) Total dose:  Cannot be calculated* Dosing weight:  77.1  *Administration dose not documented Date/Time  Rate/Dose/Volume Action  03/30/20 1439  *Not included in total MAR Hold    thiamine (B-1) 250 mg in sodium chloride 0.9 % 50 mL IVPB (mL/hr) Total dose:  Cannot be calculated* Dosing weight:  77.1  *Administration dose not documented Date/Time  Rate/Dose/Volume Action  03/30/20 1439  *Not included in total MAR Hold    acetaminophen (TYLENOL) tablet 650 mg (mg) Total dose:  Cannot be calculated* Dosing weight:  77.1  *Administration dose not documented Date/Time  Rate/Dose/Volume Action  03/30/20 1439  *Not included in total MAR Hold    aspirin EC tablet 81 mg (mg) Total dose:  Cannot be calculated* Dosing weight:  66.9  *Administration dose not documented Date/Time  Rate/Dose/Volume Action  03/30/20 1439  *Not included in total MAR Hold    Chlorhexidine Gluconate Cloth 2 % PADS 6 each (each) Total dose:  Cannot be calculated* Dosing weight:  67.2  *Administration dose not documented Date/Time  Rate/Dose/Volume Action  03/30/20 1439  *Not included in total MAR Hold    feeding supplement (GLUCERNA SHAKE) (GLUCERNA SHAKE) liquid 237 mL (mL) Total dose:  Cannot be calculated* Dosing weight:   77.1  *Administration dose not documented Date/Time  Rate/Dose/Volume Action  03/30/20 1439  *Not included in total MAR Hold    furosemide (LASIX) injection 40 mg (mg) Total dose:  Cannot be calculated* Dosing weight:  67.2  *Administration dose not documented Date/Time  Rate/Dose/Volume Action  03/30/20 1439  *Not included in total MAR Hold    heparin injection 5,000 Units (Units) Total dose:  Cannot be calculated* Dosing weight:  66.9  *Administration dose not documented Date/Time  Rate/Dose/Volume Action  03/30/20 1439  *Not included in total MAR Hold    insulin aspart (novoLOG) injection 0-5 Units (Units) Total dose:  Cannot be calculated* Dosing weight:  66.9  *Administration dose not documented Date/Time  Rate/Dose/Volume Action  03/30/20 1439  *Not included in total MAR Hold    insulin aspart (novoLOG) injection 0-9 Units (Units) Total dose:  Cannot be calculated* Dosing weight:  66.9  *Administration dose not documented Date/Time  Rate/Dose/Volume Action  03/30/20 1439  *Not included in total MAR Hold    insulin glargine (LANTUS) injection 12 Units (Units) Total dose:  Cannot be calculated* Dosing weight:  67.6  *Administration dose not documented Date/Time  Rate/Dose/Volume Action  03/30/20 1439  *Not included in total MAR Hold  losartan (COZAAR) tablet 25 mg (mg) Total dose:  Cannot be calculated* Dosing weight:  67.2  *Administration dose not documented Date/Time  Rate/Dose/Volume Action  03/30/20 1439  *Not included in total MAR Hold    metoprolol tartrate (LOPRESSOR) tablet 25 mg (mg) Total dose:  Cannot be calculated* Dosing weight:  67.6  *Administration dose not documented Date/Time  Rate/Dose/Volume Action  03/30/20 1439  *Not included in total MAR Hold    multivitamin with minerals tablet 1 tablet (tablet) Total dose:  Cannot be calculated* Dosing weight:  77.1  *Administration dose not documented Date/Time  Rate/Dose/Volume Action   03/30/20 1439  *Not included in total MAR Hold  1500  *Not included in total Automatically Held    nicotine (NICODERM CQ - dosed in mg/24 hours) patch 21 mg (mg) Total dose:  Cannot be calculated* Dosing weight:  77.1  *Administration dose not documented Date/Time  Rate/Dose/Volume Action  03/30/20 1439  *Not included in total MAR Hold    polyethylene glycol (MIRALAX / GLYCOLAX) packet 17 g (g) Total dose:  Cannot be calculated* Dosing weight:  67.2  *Administration dose not documented Date/Time  Rate/Dose/Volume Action  03/30/20 1439  *Not included in total MAR Hold    senna-docusate (Senokot-S) tablet 1 tablet (tablet) Total dose:  Cannot be calculated* Dosing weight:  67.2  *Administration dose not documented Date/Time  Rate/Dose/Volume Action  03/30/20 1439  *Not included in total MAR Hold    spironolactone (ALDACTONE) tablet 12.5 mg (mg) Total dose:  Cannot be calculated* Dosing weight:  67.2  *Administration dose not documented Date/Time  Rate/Dose/Volume Action  03/30/20 1439  *Not included in total MAR Hold  1500  *Not included in total Automatically Held    Sedation Time  Sedation Time Physician-1: 26 minutes 42 seconds  Contrast  Medication Name Total Dose  iohexol (OMNIPAQUE) 350 MG/ML injection 80 mL    Radiation/Fluoro  Fluoro time: 3.6 (min) DAP: 19246 (mGycm2) Cumulative Air Kerma: 325 (mGy)  Complications  Complications documented before study signed (03/30/2020  3:54 PM)   No complications were associated with this study.  Documented by Corky Crafts, MD - 03/30/2020  3:35 PM    Coronary Findings  Diagnostic Dominance: Right Left Anterior Descending  Collaterals  Dist LAD filled by collaterals from RPDA.    Collaterals  Dist LAD filled by collaterals from 2nd Diag.    Prox LAD to Mid LAD lesion is 25% stenosed.  Mid LAD lesion is 100% stenosed. The lesion is chronically occluded with right-to-left and left-to-left collateral  flow.  Mid LAD to Dist LAD lesion is 70% stenosed.  Second Diagonal Branch  2nd Diag lesion is 40% stenosed.  Left Circumflex  Prox Cx to Mid Cx lesion is 75% stenosed.  First Obtuse Marginal Branch  1st Mrg lesion is 100% stenosed.  Third Obtuse Marginal Branch  3rd Mrg lesion is 75% stenosed.  Right Coronary Artery  There is moderate diffuse disease throughout the vessel.  Prox RCA to Mid RCA lesion is 80% stenosed. The lesion is moderately calcified.  Right Posterior Descending Artery  RPDA lesion is 90% stenosed.  Intervention  No interventions have been documented. Right Heart  Right Heart Pressures Right heart results on room air: Ao sat 90%, PA sat 57%, PA pressure 28/12, mean PA 18 mm Hg; mean PCWP 17 mm Hg; CO 4.1 L/min; CI 2.33  Left Heart  Left Ventricle The left ventricular size is normal. There is mild left ventricular systolic dysfunction. LV end diastolic  pressure is normal. The left ventricular ejection fraction is 35-45% by visual estimate.  Aortic Valve There is moderate (3+) aortic regurgitation.  Coronary Diagrams  Diagnostic Dominance: Right    ECHOCARDIOGRAM REPORT         Patient Name:   Scott Rios Date of Exam: 03/28/2020  Medical Rec #:  [id number]      Height:       67.0 in  Accession #:    [id number]     Weight:       170.0 lb  Date of Birth:  01-Jul-1956      BSA:          1.887 m  Patient Age:    64 years       BP:           128/68 mmHg  Patient Gender: M              HR:           89 bpm.  Exam Location:  Inpatient   Procedure: 2D Echo   STAT ECHO   Indications:    elevated troponin     History:        Patient has no prior history of Echocardiogram  examinations.                  Signs/Symptoms:elevated troponin; Risk  Factors:Hypertension,                  Diabetes and Current Smoker.     Sonographer:    Delcie Roch RDCS  Referring Phys: [id number] OLADAPO ADEFESO   IMPRESSIONS     1. Left ventricular ejection fraction,  by estimation, is 35 to 40%. The  left ventricle has moderately decreased function. The left ventricle  demonstrates regional wall motion abnormalities (see scoring  diagram/findings for description). The left  ventricular internal cavity size was mildly dilated. There is mild  concentric left ventricular hypertrophy. Left ventricular diastolic  parameters are consistent with Grade II diastolic dysfunction  (pseudonormalization). There is moderate hypokinesis of  the left ventricular, mid-apical inferoseptal wall and inferior wall.  There is severe hypokinesis of the left ventricular, mid-apical  anteroseptal wall.   2. Right ventricular systolic function is normal. The right ventricular  size is normal.   3. Left atrial size was severely dilated.   4. Right atrial size was mildly dilated.   5. Likely moderate eccentric MR.. The mitral valve is abnormal. Moderate  mitral valve regurgitation.   6. Fixed right coronary cusp with prominent focal calcification. Suspect  bicuspid valve, but obscured by significant calcification. Severe aortic  regurgitation, best seen on apical images, with mitral valve anterior  leaflet restriction secondary to jet.   Vena contracta 0.65 cm on 5 chamber apical, 0.80 cm on 3 chamber apical,  fills most of LVOT and projects into majority of LV cavity (see image 83).  Some diastolic flow reversal seen in descending aorta but not well  visualized.. The aortic valve has an  indeterminant number of cusps. Aortic valve regurgitation is severe. Mild  aortic valve stenosis.   7. Aortic dilatation noted. There is borderline dilatation of the  ascending aorta measuring 38 mm.   8. The inferior vena cava is dilated in size with <50% respiratory  variability, suggesting right atrial pressure of 15 mmHg.   Comparison(s): No prior Echocardiogram.   Conclusion(s)/Recommendation(s): Abnormal study. Reduced EF with both  global and focal hypokinesis; mild global  hypokinesis, with moderate  wall  motion abnormalities in the mid to apical inferoseptal and inferior wall,  with severe hypokinesis in the mid  to apical anteroseptal walls. Also with severe aortic regurgitation,  calcified and likely bicuspid aortic valve with mild stenosis.   FINDINGS   Left Ventricle: Left ventricular ejection fraction, by estimation, is 35  to 40%. The left ventricle has moderately decreased function. The left  ventricle demonstrates regional wall motion abnormalities. Moderate  hypokinesis of the left ventricular,  mid-apical inferoseptal wall and inferior wall. Severe hypokinesis of the  left ventricular, mid-apical anteroseptal wall. The left ventricular  internal cavity size was mildly dilated. There is mild concentric left  ventricular hypertrophy. Left  ventricular diastolic parameters are consistent with Grade II diastolic  dysfunction (pseudonormalization).      LV Wall Scoring:  The mid anteroseptal segment is akinetic. The mid and distal inferior  wall,  basal anteroseptal segment, mid inferoseptal segment, apical septal  segment,  and apex are hypokinetic. The entire anterior wall, entire lateral wall,  basal inferior segment, and basal inferoseptal segment are normal.   Right Ventricle: The right ventricular size is normal. No increase in  right ventricular wall thickness. Right ventricular systolic function is  normal.   Left Atrium: Left atrial size was severely dilated.   Right Atrium: Right atrial size was mildly dilated.   Pericardium: There is no evidence of pericardial effusion.   Mitral Valve: Likely moderate eccentric MR. The mitral valve is abnormal.  Moderate mitral annular calcification. Moderate mitral valve  regurgitation.   Tricuspid Valve: The tricuspid valve is normal in structure. Tricuspid  valve regurgitation is trivial. No evidence of tricuspid stenosis.   Aortic Valve: Fixed right coronary cusp with prominent focal   calcification. Suspect bicuspid valve, but obscured by significant  calcification. Severe aortic regurgitation, best seen on apical images,  with mitral valve anterior leaflet restriction  secondary to jet. Vena contracta 0.65 cm on 5 chamber apical, 0.80 cm on 3  chamber apical, fills most of LVOT and projects into majority of LV cavity  (see image 83). Some diastolic flow reversal seen in descending aorta but  not well visualized. The  aortic valve has an indeterminant number of cusps. . There is moderate  thickening and moderate calcification of the aortic valve. Aortic valve  regurgitation is severe. Aortic regurgitation PHT measures 266 msec. Mild  aortic stenosis is present. There is  moderate thickening of the aortic valve. There is moderate calcification  of the aortic valve. Aortic valve mean gradient measures 12.0 mmHg. Aortic  valve peak gradient measures 22.3 mmHg. Aortic valve area, by VTI measures  1.76 cm.   Pulmonic Valve: The pulmonic valve was not well visualized. Pulmonic valve  regurgitation is not visualized.   Aorta: Aortic dilatation noted. There is borderline dilatation of the  ascending aorta measuring 38 mm.   Venous: The inferior vena cava is dilated in size with less than 50%  respiratory variability, suggesting right atrial pressure of 15 mmHg.   IAS/Shunts: No atrial level shunt detected by color flow Doppler.      LEFT VENTRICLE  PLAX 2D  LVIDd:         5.54 cm      Diastology  LVIDs:         4.40 cm      LV e' lateral:   5.00 cm/s  LV PW:         1.23 cm      LV E/e' lateral: 21.4  LV IVS:        1.18 cm  LVOT diam:     2.10 cm  LV SV:         70  LV SV Index:   37  LVOT Area:     3.46 cm     LV Volumes (MOD)  LV vol d, MOD A2C: 117.0 ml  LV vol d, MOD A4C: 106.0 ml  LV vol s, MOD A2C: 75.0 ml  LV vol s, MOD A4C: 83.4 ml  LV SV MOD A2C:     42.0 ml  LV SV MOD A4C:     106.0 ml  LV SV MOD BP:      32.1 ml   RIGHT VENTRICLE  RV S  prime:     13.20 cm/s  TAPSE (M-mode): 2.7 cm   LEFT ATRIUM             Index       RIGHT ATRIUM           Index  LA diam:        4.30 cm 2.28 cm/m  RA Area:     14.80 cm  LA Vol (A2C):   57.4 ml 30.41 ml/m RA Volume:   35.30 ml  18.70 ml/m  LA Vol (A4C):   81.8 ml 43.34 ml/m  LA Biplane Vol: 68.7 ml 36.40 ml/m   AORTIC VALVE  AV Area (Vmax):    1.67 cm  AV Area (Vmean):   1.74 cm  AV Area (VTI):     1.76 cm  AV Vmax:           236.00 cm/s  AV Vmean:          158.500 cm/s  AV VTI:            0.401 m  AV Peak Grad:      22.3 mmHg  AV Mean Grad:      12.0 mmHg  LVOT Vmax:         113.50 cm/s  LVOT Vmean:        79.550 cm/s  LVOT VTI:          0.204 m  LVOT/AV VTI ratio: 0.51  AI PHT:            266 msec     AORTA  Ao Root diam: 3.60 cm  Ao Asc diam:  3.80 cm   MITRAL VALVE  MV Area (PHT): 6.12 cm     SHUNTS  MV Decel Time: 124 msec     Systemic VTI:  0.20 m  MR Peak grad:   101.6 mmHg  Systemic Diam: 2.10 cm  MR Mean grad:   63.0 mmHg  MR Vmax:        504.00 cm/s  MR Vmean:       376.0 cm/s  MR PISA:        1.01 cm  MR PISA Radius: 0.40 cm  MV E velocity: 107.00 cm/s  MV A velocity: 69.40 cm/s  MV E/A ratio:  1.54   Jodelle RedBridgette Christopher MD  Electronically signed by Jodelle RedBridgette Christopher MD  Signature Date/Time: 03/28/2020/12:53:56 PM    Treatments:   CARDIOVASCULAR SURGERY OPERATIVE NOTE   04/09/2020   Surgeon:  Alleen BorneBryan K. Bartle, MD   First Assistant: Jillyn HiddenMyron Jayion Schneck,  PA-C     Preoperative Diagnosis:  Severe multi-vessel coronary artery disease, Severe aortic insufficiency, Severe mitral regurgitation.     Postoperative Diagnosis:  Same     Procedure:  Median Sternotomy Extracorporeal circulation 3.   Coronary artery bypass grafting x 6   Left internal mammary artery graft to the LAD SVG to diagonal Sequential SVG to OM1 and OM3 Sequential SVG to PDA and PL 4.   Endoscopic vein harvest from the right leg 5.   Mitral valve repair  with 28 mm Sorin, Memo 4D annuloplasty ring 6.   Clipping of left atrial appendage 7.   Aortic valve replacement using a 23 mm Edwards INSPIRIS RESILIA pericardial valve.     Anesthesia:  General Endotracheal     Clinical History/Surgical Indication:   This 64 year old poorly controlled diabetic smoker has severe three-vessel coronary disease with a reduced ejection fraction of 35 to 40%, severe aortic insufficiency, and moderate to severe mitral regurgitation.  He has never had any chest discomfort but has had some recent lower extremity edema and shortness of breath as well as exertional fatigue.  He presented with acute right lower extremity weakness and numbness this sounds a could have been a TIA or stroke although is MRI of the brain showed no evidence of acute stroke.  Carotid Dopplers showed no significant stenosis.  I think the best treatment for his cardiac disease is to proceed with coronary artery bypass graft surgery as well as aortic valve replacement and probable mitral valve repair.  His operative risk is increased due to the complexity of the surgery needed as well as his poorly controlled diabetes with a hemoglobin A1c of 10.7 and COPD with ongoing heavy smoking. I discussed the operative procedure with the patient  including alternatives, benefits and risks; including but not limited to bleeding, blood transfusion, infection, stroke, myocardial infarction, graft failure, heart block requiring a permanent pacemaker, organ dysfunction, and death.  Scott Rios understands and agrees to proceed.

## 2020-04-10 NOTE — Progress Notes (Signed)
Nutrition Follow Up  DOCUMENTATION CODES:   Non-severe (moderate) malnutrition in context of chronic illness  INTERVENTION:    Glucerna Shake po BID, each supplement provides 220 kcal and 10 grams of protein  30 ml ProSource Plus BID, each supplement provides 100 kcals and 15 grams protein.   MVI daily   NUTRITION DIAGNOSIS:   Moderate Malnutrition related to chronic illness (CHF) as evidenced by mild fat depletion, moderate muscle depletion, severe muscle depletion.  Ongoing  GOAL:   Patient will meet greater than or equal to 90% of their needs  Progressing  MONITOR:   PO intake, Supplement acceptance, Weight trends, Labs, I & O's  REASON FOR ASSESSMENT:   Consult Assessment of nutrition requirement/status  ASSESSMENT:   Patient with medical history significant for hypertension and diabetes not on home medication, tobacco abuse and prior history of alcohol abuse (quit 10 years ago). Presents this admission with CHF exacerbation and R leg weakness.   8/20- multiple tooth extractions 8/26- CABG x6, MVR, AVR  8/27- extubated   Pt discussed during ICU rounds and with RN.   Pt eating 75-100% of meals prior to surgery. Appetite slow to progress s/p extubation. Discussed the importance of protein intake for preservation of lean body mass and to promote post op healing. Pt willing to try supplementation.   Pt denies loss in appetite PTA. Unsure of weight loss. Records lack weight history over the last year making it difficult to quantify dry weight loss. Presents with mild to moderate fat depletion and moderate to severe muscle depletion.   Of note, pt ambulated with walker at home and tried to walk daily.   Admission weight: 67.2 kg  Current weight: 64.9 kg   Drips: neosynephrine Medications: dulcolax, colace, 40 mg lasix BID, SS novolog, levemir Labs: Na 132 (L) CBG 111-250  NUTRITION - FOCUSED PHYSICAL EXAM:    Most Recent Value  Orbital Region No depletion   Upper Arm Region Moderate depletion  Thoracic and Lumbar Region Unable to assess  Buccal Region Mild depletion  Temple Region Moderate depletion  Clavicle Bone Region Severe depletion  Clavicle and Acromion Bone Region Severe depletion  Scapular Bone Region Unable to assess  Dorsal Hand Mild depletion  Patellar Region Moderate depletion  Anterior Thigh Region Moderate depletion  Posterior Calf Region Moderate depletion  Edema (RD Assessment) Mild  Hair Reviewed  Eyes Reviewed  Mouth Reviewed  Skin Reviewed  Nails Reviewed     Diet Order:   Diet Order            Diet Carb Modified Fluid consistency: Thin; Room service appropriate? Yes  Diet effective now                 EDUCATION NEEDS:   Not appropriate for education at this time  Skin:  Skin Assessment: Skin Integrity Issues: Skin Integrity Issues:: Incisions Incisions: R leg, chest  Last BM:  8/24  Height:   Ht Readings from Last 1 Encounters:  04/07/20 5\' 7"  (1.702 m)    Weight:   Wt Readings from Last 1 Encounters:  04/09/20 64.9 kg    Ideal Body Weight:  67.3 kg  BMI:  Body mass index is 22.4 kg/m.  Estimated Nutritional Needs:   Kcal:  2050-2250 kcal  Protein:  105-120 grams  Fluid:  >/= 2 L/day   04/11/20 RD, LDN Clinical Nutrition Pager listed in [facility]

## 2020-04-10 NOTE — Procedures (Signed)
Extubation Procedure Note  Patient Details:   Name: Scott Rios DOB: [birth year] MRN: [id number]   Airway Documentation:  Airway 8 mm (Active)  Secured at (cm) 22 cm 04/09/20 2327  Measured From Lips 04/09/20 2327  Secured Location Right 04/09/20 2327  Secured By Pink Tape 04/09/20 2327  Site Condition Dry 04/09/20 2327   Vent end date: (not recorded) Vent end time: (not recorded)   Evaluation  O2 sats: stable throughout Complications: No apparent complications Patient did tolerate procedure well. Bilateral Breath Sounds: Clear, Diminished   Yes  PT extubated per Rapid wean protocol, PT abg was in tolerable range, NIF-20, VC 1.2  Hassan Buckler 04/10/2020, 12:39 AM

## 2020-04-10 NOTE — Progress Notes (Addendum)
Patient ID: Scott Rios, male   DOB: 06/06/1956, 64 y.o.   MRN: [id number] EVENING ROUNDS NOTE :     [address]       [place] [address]             [phone]                 1 Day Post-Op Procedure(s) (LRB): CORONARY ARTERY BYPASS GRAFTING (CABG) X 6 USING LIMA TO LAD; ENDOSCOPICALLY HARVESTED RIGHT GREATER SAPHENOUS VEIN: SVG to D1; SVG seq to OM2 and OM3; SVG seq to PD and PL. AORTIC VALVE REPLACEMENT USING INSPIRIS RESILIA 23 MM AORTIC VALVE. 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 LEFT ATRIAL APPENDAGE CLIPPING USING ATRICURE PRO240 40 MM CLIP. (N/A) AORTIC VALVE REPLACEMENT (AVR) USING EDWARDS INSPIRIS RESILIA 23 MM AORTIC VALVE. (N/A) MITRAL VALVE REPLACEMENT (MVR) USING MEMO 4D 28 MM MITRAL VALVE (N/A) TRANSESOPHAGEAL ECHOCARDIOGRAM (TEE) (N/A) ENDOVEIN HARVEST OF GREATER SAPHENOUS VEIN (Right) CLIPPING OF ATRIAL APPENDAGE USING  ATRICURE [id number] ATRICLIP (Left)  Total Length of Stay:  LOS: 13 days  BP 115/70   Pulse 80   Temp 98.6 F (37 C) (Oral)   Resp (!) 29   Ht 5\' 7"  (1.702 m)   Wt 64.9 kg   SpO2 100%   BMI 22.40 kg/m   .Intake/Output      08/27 0701 - 08/28 0700   I.V. (mL/kg) 99.4 (1.5)   Blood 630   IV Piggyback 200   Total Intake(mL/kg) 929.4 (14.3)   Urine (mL/kg/hr) 1190 (1.5)   Blood    Chest Tube 280   Total Output 1470   Net -540.6         . sodium chloride    . sodium chloride    . sodium chloride 20 mL/hr at 04/09/20 1743  . lactated ringers    . lactated ringers 20 mL/hr at 04/09/20 2000  . nitroGLYCERIN Stopped (04/09/20 1906)  . phenylephrine (NEO-SYNEPHRINE) Adult infusion 15 mcg/min (04/10/20 1800)     Lab Results  Component Value Date   WBC 12.8 (H) 04/10/2020   HGB 7.9 (L) 04/10/2020   HCT 23.5 (L) 04/10/2020   PLT 155 04/10/2020   GLUCOSE 121 (H) 04/10/2020   ALT 19 04/09/2020   AST 17 04/09/2020   NA 132 (L) 04/10/2020   K 4.3 04/10/2020   CL 102 04/10/2020   CREATININE 0.58 (L)  04/10/2020   BUN 10 04/10/2020   CO2 21 (L) 04/10/2020   TSH 2.465 03/29/2020   INR 1.4 (H) 04/09/2020   HGBA1C 10.0 (H) 04/09/2020   bp low still need neo Given two units of blood , hgb 7.8  Follow up hct in am  No teeth, not eating much, encourage po intake  04/11/2020 MD  Beeper [phone] Office [phone] 04/10/2020 7:07 PM

## 2020-04-11 ENCOUNTER — Inpatient Hospital Stay (HOSPITAL_COMMUNITY): Payer: Medicare Other

## 2020-04-11 DIAGNOSIS — F4321 Adjustment disorder with depressed mood: Secondary | ICD-10-CM

## 2020-04-11 DIAGNOSIS — T1491XA Suicide attempt, initial encounter: Secondary | ICD-10-CM

## 2020-04-11 LAB — BASIC METABOLIC PANEL
Anion gap: 7 (ref 5–15)
BUN: 16 mg/dL (ref 8–23)
CO2: 25 mmol/L (ref 22–32)
Calcium: 8.4 mg/dL — ABNORMAL LOW (ref 8.9–10.3)
Chloride: 100 mmol/L (ref 98–111)
Creatinine, Ser: 0.64 mg/dL (ref 0.61–1.24)
GFR calc Af Amer: >60 mL/min (ref >60)
GFR calc non Af Amer: >60 mL/min (ref >60)
Glucose, Bld: 82 mg/dL (ref 70–99)
Potassium: 3.9 mmol/L (ref 3.5–5.1)
Sodium: 132 mmol/L — ABNORMAL LOW (ref 135–145)

## 2020-04-11 LAB — GLUCOSE, CAPILLARY
Glucose-Capillary: 79 mg/dL (ref 70–99)
Glucose-Capillary: 70 mg/dL (ref 70–99)
Glucose-Capillary: 229 mg/dL — ABNORMAL HIGH (ref 70–99)
Glucose-Capillary: 152 mg/dL — ABNORMAL HIGH (ref 70–99)
Glucose-Capillary: 78 mg/dL (ref 70–99)

## 2020-04-11 LAB — CBC
HCT: 23.1 % — ABNORMAL LOW (ref 39.0–52.0)
Hemoglobin: 7.8 g/dL — ABNORMAL LOW (ref 13.0–17.0)
MCH: 30.8 pg (ref 26.0–34.0)
MCHC: 33.8 g/dL (ref 30.0–36.0)
MCV: 91.3 fL (ref 80.0–100.0)
Platelets: 153 10*3/uL (ref 150–400)
RBC: 2.53 MIL/uL — ABNORMAL LOW (ref 4.22–5.81)
RDW: 14.8 % (ref 11.5–15.5)
WBC: 15.2 10*3/uL — ABNORMAL HIGH (ref 4.0–10.5)
nRBC: 0 % (ref 0.0–0.2)

## 2020-04-11 MED ORDER — TRAZODONE HCL 50 MG PO TABS
50.0000 mg | ORAL_TABLET | Freq: Every day | ORAL | Status: DC
  Administered 2020-04-11 – 2020-04-17 (×7): 50 mg via ORAL
  Filled 2020-04-11 (×8): qty 1

## 2020-04-11 NOTE — Consult Note (Signed)
[facility] Face-to-Face Psychiatry Consult   Reason for Consult:  ''suicide attempt'' Referring Physician:  Sheliah Plane, MD Patient Identification: KIPTYN RAFUSE MRN:  [id number] Principal Diagnosis: Right leg weakness Diagnosis:  Principal Problem:   Right leg weakness Active Problems:   Flash pulmonary edema (HCC)   Hypomagnesemia   Hypoalbuminemia   Hyperglycemia   Hyponatremia   Elevated troponin   Pulmonary nodule   Acute respiratory failure with hypoxia (HCC)   Tobacco abuse   Acute pulmonary edema (HCC)   Weakness of right lower extremity   Acute systolic heart failure (HCC)   Mitral regurgitation   Nonrheumatic aortic valve insufficiency   Non-ST elevation (NSTEMI) myocardial infarction (HCC)   S/P CABG x 6   Malnutrition of moderate degree   Adjustment disorder with depressed mood   Total Time spent with patient: 45 minutes  Subjective:   Scott Rios is a 64 y.o. male patient admitted with right leg weakness.  HPI:   64 y.o. male with history significant for hypertension, diabetes, S/P Coronary artery bypass grafting, S/P Mitral valve replacement, alcohol abuse (quit 10 years ago) who was admitted to the hospital originally due to 54-month history of right leg weakness. This consult was initiated after patient was found during that night wrapping the call bell cord around his neck and told the nurse that he would kill himself now or later. Patient reports history of depression 5 years ago after he found his wife dead at their home. He states that he did not seek help for grief counseling and has been living with unresolved guilt of not been able to help his wife. Also, he reports history of suicidal ideation with plan to shot himself many years ago after his first wife left him-did not seek help either. Patient reports current multiple stressors in his life such as health problem, being lonely, inability to sleep most nights and having no clues if he is going to get better  following his current surgery. He states that: '' I decided to do something stupid last night by attempting to end it all, I was fed up but after they put this gentle man to stay with me in my room I am beginning to think about life differently, I just need some help from being lonely.''  Today, patient is alert, cooperative, denies psychosis, delusions but unable to contract for safety. However, he agrees to take low dose of anti-depressant if it will help him sleep better. He denies current history of alcohol/drug use.  Past Psychiatric History:  As above  Risk to Self:   unable to contract for safety Risk to Others:  denies Prior Inpatient Therapy:  none reported Prior Outpatient Therapy:  none reported by the patient   Past Medical History:  Past Medical History:  Diagnosis Date  . Diabetes mellitus without complication (HCC)   . Hypertension     Past Surgical History:  Procedure Laterality Date  . AORTIC VALVE REPLACEMENT N/A 04/09/2020   Procedure: AORTIC VALVE REPLACEMENT (AVR) USING EDWARDS INSPIRIS RESILIA 23 MM AORTIC VALVE.;  Surgeon: Alleen Borne, MD;  Location: [facility] OR;  Service: Open Heart Surgery;  Laterality: N/A;  . CHOLECYSTECTOMY    . CLIPPING OF ATRIAL APPENDAGE Left 04/09/2020   Procedure: CLIPPING OF ATRIAL APPENDAGE USING  ATRICURE [id number] ATRICLIP;  Surgeon: Alleen Borne, MD;  Location: [facility] OR;  Service: Open Heart Surgery;  Laterality: Left;  . CORONARY ARTERY BYPASS GRAFT N/A 04/09/2020   Procedure: CORONARY ARTERY BYPASS  GRAFTING (CABG) X 6 USING LIMA TO LAD; ENDOSCOPICALLY HARVESTED RIGHT GREATER SAPHENOUS VEIN: SVG to D1; SVG seq to OM2 and OM3; SVG seq to PD and PL. AORTIC VALVE REPLACEMENT USING INSPIRIS RESILIA 23 MM AORTIC VALVE. 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 LEFT ATRIAL APPENDAGE CLIPPING USING ATRICURE PRO240 40 MM CLIP.;  Surgeon: Alleen Borne, MD;   . ENDOVEIN HARVEST OF GREATER SAPHENOUS VEIN Right 04/09/2020   Procedure: ENDOVEIN  HARVEST OF GREATER SAPHENOUS VEIN;  Surgeon: Alleen Borne, MD;  Location: [facility] OR;  Service: Open Heart Surgery;  Laterality: Right;  . HERNIA REPAIR    . MITRAL VALVE REPAIR N/A 04/09/2020   Procedure: MITRAL VALVE REPLACEMENT (MVR) USING MEMO 4D 28 MM MITRAL VALVE;  Surgeon: Alleen Borne, MD;  Location: [facility] OR;  Service: Open Heart Surgery;  Laterality: N/A;  . MULTIPLE EXTRACTIONS WITH ALVEOLOPLASTY N/A 04/03/2020   Procedure: Extraction of tooth #'s 11,22,23,26,27,28,29 with alveoloplasty and bilateral mandibular tori reductions;  Surgeon: Charlynne Pander, DDS;  Location: [facility] OR;  Service: Oral Surgery;  Laterality: N/A;  . RIGHT/LEFT HEART CATH AND CORONARY ANGIOGRAPHY N/A 03/30/2020   Procedure: RIGHT/LEFT HEART CATH AND CORONARY ANGIOGRAPHY;  Surgeon: Corky Crafts, MD;  Location: [facility] INVASIVE CV LAB;  Service: Cardiovascular;  Laterality: N/A;  . TEE WITHOUT CARDIOVERSION N/A 03/31/2020   Procedure: TRANSESOPHAGEAL ECHOCARDIOGRAM (TEE);  Surgeon: Jodelle Red, MD;  Location: [facility] ENDOSCOPY;  Service: Cardiovascular;  Laterality: N/A;  . TEE WITHOUT CARDIOVERSION N/A 04/09/2020   Procedure: TRANSESOPHAGEAL ECHOCARDIOGRAM (TEE);  Surgeon: Alleen Borne, MD;  Location: [facility] OR;  Service: Open Heart Surgery;  Laterality: N/A;   Family History:  Family History  Problem Relation Age of Onset  . CAD Father        died of MI in 87's   Family Psychiatric  History:  Social History:  Social History   Substance and Sexual Activity  Alcohol Use No     Social History   Substance and Sexual Activity  Drug Use No    Social History   Socioeconomic History  . Marital status: Widowed    Spouse name: Not on file  . Number of children: Not on file  . Years of education: Not on file  . Highest education level: Not on file  Occupational History  . Not on file  Tobacco Use  . Smoking status: Current Every Day Smoker    Packs/day: 1.00    Types: Cigarettes  . Smokeless  tobacco: Never Used  Vaping Use  . Vaping Use: Never used  Substance and Sexual Activity  . Alcohol use: No  . Drug use: No  . Sexual activity: Not on file  Other Topics Concern  . Not on file  Social History Narrative  . Not on file   Social Determinants of Health   Financial Resource Strain:   . Difficulty of Paying Living Expenses: Not on file  Food Insecurity:   . Worried About Programme researcher, broadcasting/film/video in the Last Year: Not on file  . Ran Out of Food in the Last Year: Not on file  Transportation Needs:   . Lack of Transportation (Medical): Not on file  . Lack of Transportation (Non-Medical): Not on file  Physical Activity:   . Days of Exercise per Week: Not on file  . Minutes of Exercise per Session: Not on file  Stress:   . Feeling of Stress : Not on file  Social Connections:   .  Frequency of Communication with Friends and Family: Not on file  . Frequency of Social Gatherings with Friends and Family: Not on file  . Attends Religious Services: Not on file  . Active Member of Clubs or Organizations: Not on file  . Attends Banker Meetings: Not on file  . Marital Status: Not on file   Additional Social History:    Allergies:   Allergies  Allergen Reactions  . Penicillins      Unknown reaction was told it happened as a child    Labs:  Results for orders placed or performed during the hospital encounter of 03/27/20 (from the past 48 hour(s))  I-STAT, chem 8     Status: Abnormal   Collection Time: 04/09/20 12:50 PM  Result Value Ref Range   Sodium 134 (L) 135 - 145 mmol/L   Potassium 4.0 3.5 - 5.1 mmol/L   Chloride 94 (L) 98 - 111 mmol/L   BUN 11 8 - 23 mg/dL   Creatinine, Ser 1.44 (L) 0.61 - 1.24 mg/dL   Glucose, Bld 818 (H) 70 - 99 mg/dL    Comment: Glucose reference range applies only to samples taken after fasting for at least 8 hours.   Calcium, Ion 1.07 (L) 1.15 - 1.40 mmol/L   TCO2 28 22 - 32 mmol/L   Hemoglobin 9.5 (L) 13.0 - 17.0 g/dL   HCT  56.3 (L) 39 - 52 %  I-STAT, chem 8     Status: Abnormal   Collection Time: 04/09/20  1:51 PM  Result Value Ref Range   Sodium 132 (L) 135 - 145 mmol/L   Potassium 3.9 3.5 - 5.1 mmol/L   Chloride 94 (L) 98 - 111 mmol/L   BUN 11 8 - 23 mg/dL   Creatinine, Ser 1.49 (L) 0.61 - 1.24 mg/dL   Glucose, Bld 702 (H) 70 - 99 mg/dL    Comment: Glucose reference range applies only to samples taken after fasting for at least 8 hours.   Calcium, Ion 1.21 1.15 - 1.40 mmol/L   TCO2 29 22 - 32 mmol/L   Hemoglobin 8.5 (L) 13.0 - 17.0 g/dL   HCT 63.7 (L) 39 - 52 %  Hemoglobin and hematocrit, blood     Status: Abnormal   Collection Time: 04/09/20  2:26 PM  Result Value Ref Range   Hemoglobin 8.5 (L) 13.0 - 17.0 g/dL    Comment: REPEATED TO VERIFY RESULT CALLED TO, READ BACK BY AND VERIFIED WITH: MONICA BECK,RN AT 1445 04/09/2020 BY ZBEECH.    HCT 25.0 (L) 39 - 52 %    Comment: Performed at [facility], [address], [place], [place] [address]  Platelet count     Status: None   Collection Time: 04/09/20  2:26 PM  Result Value Ref Range   Platelets 212 150 - 400 K/uL    Comment: Performed at [facility], [address] [place], [place] [address]  Fibrinogen     Status: None   Collection Time: 04/09/20  2:26 PM  Result Value Ref Range   Fibrinogen 267 210 - 475 mg/dL    Comment: Performed at [facility] Lab, [address], [place], [place] [address]  I-STAT, [place] 8     Status: Abnormal   Collection Time: 04/09/20  2:27 PM  Result Value Ref Range   Sodium 131 (L) 135 - 145 mmol/L   Potassium 4.8 3.5 - 5.1 mmol/L   Chloride 95 (L) 98 - 111  mmol/L   BUN 12 8 - 23 mg/dL   Creatinine, Ser 1.610.40 (L) 0.61 - 1.24 mg/dL   Glucose, Bld 096108 (H) 70 - 99 mg/dL    Comment: Glucose reference range applies only to samples taken after fasting for at least 8 hours.   Calcium, Ion 1.15 1.15 - 1.40 mmol/L   TCO2 27 22 - 32 mmol/L   Hemoglobin 8.5 (L) 13.0 - 17.0 g/dL   HCT 04.525.0 (L) 39 - 52 %   I-STAT 7, (LYTES, BLD GAS, ICA, H+H)     Status: Abnormal   Collection Time: 04/09/20  2:30 PM  Result Value Ref Range   pH, Arterial 7.518 (H) 7.35 - 7.45   pCO2 arterial 35.0 32 - 48 mmHg   pO2, Arterial 314 (H) 83 - 108 mmHg   Bicarbonate 28.4 (H) 20.0 - 28.0 mmol/L   TCO2 29 22 - 32 mmol/L   O2 Saturation 100.0 %   Acid-Base Excess 5.0 (H) 0.0 - 2.0 mmol/L   Sodium 132 (L) 135 - 145 mmol/L   Potassium 4.7 3.5 - 5.1 mmol/L   Calcium, Ion 1.13 (L) 1.15 - 1.40 mmol/L   HCT 26.0 (L) 39 - 52 %   Hemoglobin 8.8 (L) 13.0 - 17.0 g/dL   Sample type ARTERIAL   I-STAT, chem 8     Status: Abnormal   Collection Time: 04/09/20  3:30 PM  Result Value Ref Range   Sodium 133 (L) 135 - 145 mmol/L   Potassium 3.6 3.5 - 5.1 mmol/L   Chloride 96 (L) 98 - 111 mmol/L   BUN 11 8 - 23 mg/dL   Creatinine, Ser 4.090.50 (L) 0.61 - 1.24 mg/dL   Glucose, Bld 811102 (H) 70 - 99 mg/dL    Comment: Glucose reference range applies only to samples taken after fasting for at least 8 hours.   Calcium, Ion 1.10 (L) 1.15 - 1.40 mmol/L   TCO2 24 22 - 32 mmol/L   Hemoglobin 7.5 (L) 13.0 - 17.0 g/dL   HCT 91.422.0 (L) 39 - 52 %  I-STAT 7, (LYTES, BLD GAS, ICA, H+H)     Status: Abnormal   Collection Time: 04/09/20  3:35 PM  Result Value Ref Range   pH, Arterial 7.380 7.35 - 7.45   pCO2 arterial 41.1 32 - 48 mmHg   pO2, Arterial 159 (H) 83 - 108 mmHg   Bicarbonate 24.3 20.0 - 28.0 mmol/L   TCO2 26 22 - 32 mmol/L   O2 Saturation 99.0 %   Acid-base deficit 1.0 0.0 - 2.0 mmol/L   Sodium 134 (L) 135 - 145 mmol/L   Potassium 3.5 3.5 - 5.1 mmol/L   Calcium, Ion 1.12 (L) 1.15 - 1.40 mmol/L   HCT 25.0 (L) 39 - 52 %   Hemoglobin 8.5 (L) 13.0 - 17.0 g/dL   Sample type ARTERIAL   Glucose, capillary     Status: Abnormal   Collection Time: 04/09/20  4:56 PM  Result Value Ref Range   Glucose-Capillary 103 (H) 70 - 99 mg/dL    Comment: Glucose reference range applies only to samples taken after fasting for at least 8 hours.   I-STAT 7, (LYTES, BLD GAS, ICA, H+H)     Status: Abnormal   Collection Time: 04/09/20  4:59 PM  Result Value Ref Range   pH, Arterial 7.334 (L) 7.35 - 7.45   pCO2 arterial 45.4 32 - 48 mmHg   pO2, Arterial 74 (L) 83 - 108 mmHg   Bicarbonate  24.2 20.0 - 28.0 mmol/L   TCO2 26 22 - 32 mmol/L   O2 Saturation 94.0 %   Acid-base deficit 2.0 0.0 - 2.0 mmol/L   Sodium 135 135 - 145 mmol/L   Potassium 3.7 3.5 - 5.1 mmol/L   Calcium, Ion 1.17 1.15 - 1.40 mmol/L   HCT 27.0 (L) 39 - 52 %   Hemoglobin 9.2 (L) 13.0 - 17.0 g/dL   Patient temperature 28.3 C    Sample type ARTERIAL   CBC     Status: Abnormal   Collection Time: 04/09/20  5:04 PM  Result Value Ref Range   WBC 22.7 (H) 4.0 - 10.5 K/uL   RBC 3.17 (L) 4.22 - 5.81 MIL/uL   Hemoglobin 9.4 (L) 13.0 - 17.0 g/dL   HCT 66.2 (L) 39 - 52 %   MCV 87.7 80.0 - 100.0 fL   MCH 29.7 26.0 - 34.0 pg   MCHC 33.8 30.0 - 36.0 g/dL   RDW 94.7 65.4 - 65.0 %   Platelets 179 150 - 400 K/uL   nRBC 0.0 0.0 - 0.2 %    Comment: Performed at [facility], [address], [place], [place] [address]  Protime-INR     Status: Abnormal   Collection Time: 04/09/20  5:04 PM  Result Value Ref Range   Prothrombin Time 18.9 (H) 11.4 - 15.2 seconds   INR 1.7 (H) 0.8 - 1.2    Comment: (NOTE) INR goal varies based on device and disease states. Performed at [facility], [address] [place], [place] [address]   APTT     Status: Abnormal   Collection Time: 04/09/20  5:04 PM  Result Value Ref Range   aPTT 37 (H) 24 - 36 seconds    Comment:        IF BASELINE aPTT IS ELEVATED, SUGGEST PATIENT RISK ASSESSMENT BE USED TO DETERMINE APPROPRIATE ANTICOAGULANT THERAPY. Performed at [facility], [address], [place], [place] [address]   Prepare Pheresed Platelets     Status: None   Collection Time: 04/09/20  5:34 PM  Result Value Ref Range   Unit Number [id number]    Blood Component Type PLTP2 PSORALEN TREATED    Unit division 00     Status of Unit ISSUED,FINAL    Transfusion Status      OK TO TRANSFUSE Performed at [facility], [address] [place], [place] [address]   Prepare fresh frozen plasma     Status: None   Collection Time: 04/09/20  6:00 PM  Result Value Ref Range   Unit Number [id number]    Blood Component Type THAWED PLASMA    Unit division 00    Status of Unit ISSUED,FINAL    Transfusion Status OK TO TRANSFUSE    Unit Number [id number]    Blood Component Type THAWED PLASMA    Unit division 00    Status of Unit ISSUED,FINAL    Transfusion Status      OK TO TRANSFUSE Performed at [facility], [address], [place], [place] [address]   Glucose, capillary     Status: None   Collection Time: 04/09/20  6:11 PM  Result Value Ref Range   Glucose-Capillary 72 70 - 99 mg/dL    Comment: Glucose reference range applies only to samples taken after fasting for at least 8 hours.  Glucose, capillary     Status: Abnormal   Collection Time: 04/09/20  7:11 PM  Result Value Ref Range   Glucose-Capillary 100 (H) 70 - 99 mg/dL    Comment: Glucose reference range applies only to samples taken after fasting for at least 8 hours.   Comment 1 Document in Chart   Glucose, capillary     Status: Abnormal   Collection Time: 04/09/20  8:16 PM  Result Value Ref Range   Glucose-Capillary 137 (H) 70 - 99 mg/dL    Comment: Glucose reference range applies only to samples taken after fasting for at least 8 hours.  Glucose, capillary     Status: Abnormal   Collection Time: 04/09/20  9:16 PM  Result Value Ref Range   Glucose-Capillary 124 (H) 70 - 99 mg/dL    Comment: Glucose reference range applies only to samples taken after fasting for at least 8 hours.  CBC     Status: Abnormal   Collection Time: 04/09/20 10:12 PM  Result Value Ref Range   WBC 19.5 (H) 4.0 - 10.5 K/uL   RBC 2.54 (L) 4.22 - 5.81 MIL/uL   Hemoglobin 7.7 (L) 13.0 - 17.0 g/dL   HCT 16.1 (L) 39 - 52 %   MCV 87.8 80.0 - 100.0 fL    MCH 30.3 26.0 - 34.0 pg   MCHC 34.5 30.0 - 36.0 g/dL   RDW 09.6 04.5 - 40.9 %   Platelets 200 150 - 400 K/uL   nRBC 0.0 0.0 - 0.2 %    Comment: Performed at [facility], [address] [place], [place] [address]  Basic metabolic panel     Status: Abnormal   Collection Time: 04/09/20 10:12 PM  Result Value Ref Range   Sodium 132 (L) 135 - 145 mmol/L   Potassium 4.5 3.5 - 5.1 mmol/L   Chloride 102 98 - 111 mmol/L   CO2 21 (L) 22 - 32 mmol/L   Glucose, Bld 143 (H) 70 - 99 mg/dL    Comment: Glucose reference range applies only to samples taken after fasting for at least 8 hours.   BUN 12 8 - 23 mg/dL   Creatinine, Ser 4.78 (L) 0.61 - 1.24 mg/dL   Calcium 7.7 (L) 8.9 - 10.3 mg/dL   GFR calc non Af Amer >60 >60 mL/min   GFR calc Af Amer >60 >60 mL/min   Anion gap 9 5 - 15    Comment: Performed at [facility], [address], [place], [place] [address]  Magnesium     Status: Abnormal   Collection Time: 04/09/20 10:12 PM  Result Value Ref Range   Magnesium 2.8 (H) 1.7 - 2.4 mg/dL    Comment: Performed at [facility], [address], [place], [place] [address]  Protime-INR     Status: Abnormal   Collection Time: 04/09/20 10:12 PM  Result Value Ref Range   Prothrombin Time 16.4 (H) 11.4 - 15.2 seconds   INR 1.4 (H) 0.8 - 1.2    Comment: (NOTE) INR goal varies based on device and disease states. Performed at [facility], [address], [place], [place] [address]   APTT     Status: Abnormal   Collection Time: 04/09/20 10:12 PM  Result Value Ref Range   aPTT 40 (H) 24 - 36 seconds    Comment:        IF BASELINE aPTT IS ELEVATED, SUGGEST PATIENT RISK ASSESSMENT BE USED TO DETERMINE APPROPRIATE ANTICOAGULANT THERAPY. Performed at [facility], [address] [place], [place] [address]  Glucose, capillary     Status: Abnormal   Collection Time: 04/09/20 10:17 PM  Result Value Ref Range   Glucose-Capillary 140 (H) 70 - 99 mg/dL    Comment:  Glucose reference range applies only to samples taken after fasting for at least 8 hours.  Glucose, capillary     Status: Abnormal   Collection Time: 04/10/20 12:14 AM  Result Value Ref Range   Glucose-Capillary 134 (H) 70 - 99 mg/dL    Comment: Glucose reference range applies only to samples taken after fasting for at least 8 hours.  I-STAT 7, (LYTES, BLD GAS, ICA, H+H)     Status: Abnormal   Collection Time: 04/10/20 12:15 AM  Result Value Ref Range   pH, Arterial 7.415 7.35 - 7.45   pCO2 arterial 36.8 32 - 48 mmHg   pO2, Arterial 116 (H) 83 - 108 mmHg   Bicarbonate 23.9 20.0 - 28.0 mmol/L   TCO2 25 22 - 32 mmol/L   O2 Saturation 99.0 %   Acid-base deficit 1.0 0.0 - 2.0 mmol/L   Sodium 134 (L) 135 - 145 mmol/L   Potassium 4.5 3.5 - 5.1 mmol/L   Calcium, Ion 1.15 1.15 - 1.40 mmol/L   HCT 19.0 (L) 39 - 52 %   Hemoglobin 6.5 (LL) 13.0 - 17.0 g/dL   Patient temperature 81.1 C    Sample type ARTERIAL   Glucose, capillary     Status: Abnormal   Collection Time: 04/10/20  1:44 AM  Result Value Ref Range   Glucose-Capillary 137 (H) 70 - 99 mg/dL    Comment: Glucose reference range applies only to samples taken after fasting for at least 8 hours.  I-STAT 7, (LYTES, BLD GAS, ICA, H+H)     Status: Abnormal   Collection Time: 04/10/20  1:56 AM  Result Value Ref Range   pH, Arterial 7.412 7.35 - 7.45   pCO2 arterial 34.2 32 - 48 mmHg   pO2, Arterial 137 (H) 83 - 108 mmHg   Bicarbonate 22.0 20.0 - 28.0 mmol/L   TCO2 23 22 - 32 mmol/L   O2 Saturation 99.0 %   Acid-base deficit 2.0 0.0 - 2.0 mmol/L   Sodium 134 (L) 135 - 145 mmol/L   Potassium 4.2 3.5 - 5.1 mmol/L   Calcium, Ion 1.18 1.15 - 1.40 mmol/L   HCT 24.0 (L) 39 - 52 %   Hemoglobin 8.2 (L) 13.0 - 17.0 g/dL   Patient temperature 91.4 C    Sample type ARTERIAL   CBC     Status: Abnormal   Collection Time: 04/10/20  2:49 AM  Result Value Ref Range   WBC 15.2 (H) 4.0 - 10.5 K/uL   RBC 2.17 (L) 4.22 - 5.81 MIL/uL   Hemoglobin  6.4 (LL) 13.0 - 17.0 g/dL    Comment: REPEATED TO VERIFY THIS CRITICAL RESULT HAS VERIFIED AND BEEN CALLED TO O. SIDDIQUI,RN BY TERRAN TYSOR ON 08 27 2021 AT 0524, AND HAS BEEN READ BACK.     HCT 19.0 (L) 39 - 52 %   MCV 87.6 80.0 - 100.0 fL   MCH 29.5 26.0 - 34.0 pg   MCHC 33.7 30.0 - 36.0 g/dL   RDW 78.2 95.6 - 21.3 %   Platelets 184 150 - 400 K/uL   nRBC 0.0 0.0 - 0.2 %    Comment: Performed at [facility], [address] [place], [place] [address]  Basic metabolic panel     Status: Abnormal  Collection Time: 04/10/20  2:49 AM  Result Value Ref Range   Sodium 132 (L) 135 - 145 mmol/L   Potassium 4.3 3.5 - 5.1 mmol/L   Chloride 102 98 - 111 mmol/L   CO2 21 (L) 22 - 32 mmol/L   Glucose, Bld 121 (H) 70 - 99 mg/dL    Comment: Glucose reference range applies only to samples taken after fasting for at least 8 hours.   BUN 10 8 - 23 mg/dL   Creatinine, Ser 1.02 (L) 0.61 - 1.24 mg/dL   Calcium 7.9 (L) 8.9 - 10.3 mg/dL   GFR calc non Af Amer >60 >60 mL/min   GFR calc Af Amer >60 >60 mL/min   Anion gap 9 5 - 15    Comment: Performed at [facility], [address], [place], [place] [address]  Magnesium     Status: None   Collection Time: 04/10/20  2:49 AM  Result Value Ref Range   Magnesium 2.3 1.7 - 2.4 mg/dL    Comment: Performed at [facility], [address] [place], [place] [address]  Glucose, capillary     Status: Abnormal   Collection Time: 04/10/20  4:23 AM  Result Value Ref Range   Glucose-Capillary 111 (H) 70 - 99 mg/dL    Comment: Glucose reference range applies only to samples taken after fasting for at least 8 hours.  Prepare RBC (crossmatch)     Status: None   Collection Time: 04/10/20  5:43 AM  Result Value Ref Range   Order Confirmation      ORDER PROCESSED BY BLOOD BANK Performed at [facility], [address], [place], [place] [address]   Glucose, capillary     Status: Abnormal   Collection Time: 04/10/20  6:25 AM  Result Value  Ref Range   Glucose-Capillary 120 (H) 70 - 99 mg/dL    Comment: Glucose reference range applies only to samples taken after fasting for at least 8 hours.  Prepare RBC (crossmatch)     Status: None   Collection Time: 04/10/20  7:15 AM  Result Value Ref Range   Order Confirmation      ORDER PROCESSED BY BLOOD BANK Performed at [facility], [address] [place], [place] [address]   Glucose, capillary     Status: Abnormal   Collection Time: 04/10/20  9:01 AM  Result Value Ref Range   Glucose-Capillary 102 (H) 70 - 99 mg/dL    Comment: Glucose reference range applies only to samples taken after fasting for at least 8 hours.  Glucose, capillary     Status: Abnormal   Collection Time: 04/10/20 10:00 AM  Result Value Ref Range   Glucose-Capillary 112 (H) 70 - 99 mg/dL    Comment: Glucose reference range applies only to samples taken after fasting for at least 8 hours.  Glucose, capillary     Status: Abnormal   Collection Time: 04/10/20 11:00 AM  Result Value Ref Range   Glucose-Capillary 123 (H) 70 - 99 mg/dL    Comment: Glucose reference range applies only to samples taken after fasting for at least 8 hours.  Glucose, capillary     Status: Abnormal   Collection Time: 04/10/20 12:05 PM  Result Value Ref Range   Glucose-Capillary 111 (H) 70 - 99 mg/dL    Comment: Glucose reference range applies only to samples taken after fasting for at least 8 hours.  CBC     Status: Abnormal  Collection Time: 04/10/20  1:00 PM  Result Value Ref Range   WBC 12.8 (H) 4.0 - 10.5 K/uL   RBC 2.60 (L) 4.22 - 5.81 MIL/uL   Hemoglobin 7.9 (L) 13.0 - 17.0 g/dL   HCT 16.1 (L) 39 - 52 %   MCV 90.4 80.0 - 100.0 fL   MCH 30.4 26.0 - 34.0 pg   MCHC 33.6 30.0 - 36.0 g/dL   RDW 09.6 04.5 - 40.9 %   Platelets 155 150 - 400 K/uL   nRBC 0.0 0.0 - 0.2 %    Comment: Performed at [facility], [address] [place], [place] [address]  Glucose, capillary     Status: Abnormal   Collection Time:  04/10/20  4:07 PM  Result Value Ref Range   Glucose-Capillary 107 (H) 70 - 99 mg/dL    Comment: Glucose reference range applies only to samples taken after fasting for at least 8 hours.  Glucose, capillary     Status: Abnormal   Collection Time: 04/10/20  7:42 PM  Result Value Ref Range   Glucose-Capillary 126 (H) 70 - 99 mg/dL    Comment: Glucose reference range applies only to samples taken after fasting for at least 8 hours.  Glucose, capillary     Status: Abnormal   Collection Time: 04/10/20 11:44 PM  Result Value Ref Range   Glucose-Capillary 161 (H) 70 - 99 mg/dL    Comment: Glucose reference range applies only to samples taken after fasting for at least 8 hours.  Basic metabolic panel     Status: Abnormal   Collection Time: 04/11/20  3:24 AM  Result Value Ref Range   Sodium 132 (L) 135 - 145 mmol/L   Potassium 3.9 3.5 - 5.1 mmol/L   Chloride 100 98 - 111 mmol/L   CO2 25 22 - 32 mmol/L   Glucose, Bld 82 70 - 99 mg/dL    Comment: Glucose reference range applies only to samples taken after fasting for at least 8 hours.   BUN 16 8 - 23 mg/dL   Creatinine, Ser 4.78 0.61 - 1.24 mg/dL   Calcium 8.4 (L) 8.9 - 10.3 mg/dL   GFR calc non Af Amer >60 >60 mL/min   GFR calc Af Amer >60 >60 mL/min   Anion gap 7 5 - 15    Comment: Performed at [id number], [address] [place], [place] [address]  CBC     Status: Abnormal   Collection Time: 04/11/20  3:24 AM  Result Value Ref Range   WBC 15.2 (H) 4.0 - 10.5 K/uL   RBC 2.53 (L) 4.22 - 5.81 MIL/uL   Hemoglobin 7.8 (L) 13.0 - 17.0 g/dL   HCT 13.0 (L) 39 - 52 %   MCV 91.3 80.0 - 100.0 fL   MCH 30.8 26.0 - 34.0 pg   MCHC 33.8 30.0 - 36.0 g/dL   RDW 86.5 78.4 - 69.6 %   Platelets 153 150 - 400 K/uL   nRBC 0.0 0.0 - 0.2 %    Comment: Performed at [facility], [address], [place], [place] [address]  Glucose, capillary     Status: None   Collection Time: 04/11/20  3:43 AM  Result Value Ref Range   Glucose-Capillary 79  70 - 99 mg/dL    Comment: Glucose reference range applies only to samples taken after fasting for at least 8 hours.  Glucose, capillary     Status: None   Collection  Time: 04/11/20  6:55 AM  Result Value Ref Range   Glucose-Capillary 70 70 - 99 mg/dL    Comment: Glucose reference range applies only to samples taken after fasting for at least 8 hours.  Glucose, capillary     Status: Abnormal   Collection Time: 04/11/20 11:17 AM  Result Value Ref Range   Glucose-Capillary 229 (H) 70 - 99 mg/dL    Comment: Glucose reference range applies only to samples taken after fasting for at least 8 hours.    Current Facility-Administered Medications  Medication Dose Route Frequency Provider Last Rate Last Admin  . (feeding supplement) PROSource Plus liquid 30 mL  30 mL Oral BID BM Alleen Borne, MD   30 mL at 04/11/20 0906  . 0.45 % sodium chloride infusion   Intravenous Continuous PRN Roddenberry, Myron G, PA-C      . 0.9 %  sodium chloride infusion (Manually program via Guardrails IV Fluids)   Intravenous Once Lightfoot, Harrell O, MD      . 0.9 %  sodium chloride infusion  250 mL Intravenous Continuous Roddenberry, Myron G, PA-C      . 0.9 %  sodium chloride infusion   Intravenous Continuous Leary Roca, PA-C 20 mL/hr at 04/09/20 1743 New Bag/Given (Non-Interop) at 04/09/20 1743  . acetaminophen (TYLENOL) tablet 1,000 mg  1,000 mg Oral Q6H Roddenberry, Myron G, PA-C   1,000 mg at 04/11/20 1124   Or  . acetaminophen (TYLENOL) 160 MG/5ML solution 1,000 mg  1,000 mg Per Tube Q6H Roddenberry, Myron G, PA-C      . aspirin EC tablet 325 mg  325 mg Oral Daily Leary Roca, PA-C   325 mg at 04/11/20 1610   Or  . aspirin chewable tablet 324 mg  324 mg Per Tube Daily Roddenberry, Myron G, PA-C      . atorvastatin (LIPITOR) tablet 80 mg  80 mg Oral Daily Leary Roca, PA-C   80 mg at 04/11/20 9604  . bisacodyl (DULCOLAX) EC tablet 10 mg  10 mg Oral Daily Leary Roca, PA-C    10 mg at 04/11/20 5409   Or  . bisacodyl (DULCOLAX) suppository 10 mg  10 mg Rectal Daily Roddenberry, Myron G, PA-C      . chlorhexidine (PERIDEX) 0.12 % solution 15 mL  15 mL Mouth Rinse BID Alleen Borne, MD   15 mL at 04/10/20 2229  . Chlorhexidine Gluconate Cloth 2 % PADS 6 each  6 each Topical Daily Alleen Borne, MD   6 each at 04/10/20 0940  . docusate sodium (COLACE) capsule 200 mg  200 mg Oral Daily Leary Roca, PA-C   200 mg at 04/11/20 8119  . enoxaparin (LOVENOX) injection 40 mg  40 mg Subcutaneous QHS Alleen Borne, MD   40 mg at 04/10/20 2229  . feeding supplement (GLUCERNA SHAKE) (GLUCERNA SHAKE) liquid 237 mL  237 mL Oral BID BM Alleen Borne, MD   237 mL at 04/11/20 0905  . ferrous fumarate-b12-vitamic C-folic acid (TRINSICON / FOLTRIN) capsule 1 capsule  1 capsule Oral BID PC Alleen Borne, MD   1 capsule at 04/11/20 0903  . insulin aspart (novoLOG) injection 0-24 Units  0-24 Units Subcutaneous Q4H Alleen Borne, MD   8 Units at 04/11/20 1123  . insulin detemir (LEVEMIR) injection 20 Units  20 Units Subcutaneous Daily Alleen Borne, MD   20 Units at 04/11/20 1123  . lactated ringers infusion  Intravenous Continuous Roddenberry, Myron G, PA-C      . lactated ringers infusion   Intravenous Continuous Leary Roca, PA-C 20 mL/hr at 04/09/20 2000 Rate Verify at 04/09/20 2000  . MEDLINE mouth rinse  15 mL Mouth Rinse q12n4p Alleen Borne, MD   15 mL at 04/10/20 1605  . morphine 2 MG/ML injection 1-4 mg  1-4 mg Intravenous Q1H PRN Leary Roca, PA-C   2 mg at 04/11/20 6578  . nitroGLYCERIN 50 mg in dextrose 5 % 250 mL (0.2 mg/mL) infusion  0-100 mcg/min Intravenous Titrated Leary Roca, PA-C   Stopped at 04/09/20 1906  . ondansetron (ZOFRAN) injection 4 mg  4 mg Intravenous Q6H PRN Roddenberry, Myron G, PA-C      . oxyCODONE (Oxy IR/ROXICODONE) immediate release tablet 5-10 mg  5-10 mg Oral Q3H PRN Roddenberry, Myron G, PA-C      .  pantoprazole (PROTONIX) EC tablet 40 mg  40 mg Oral Daily Leary Roca, PA-C   40 mg at 04/11/20 0903  . phenylephrine (NEOSYNEPHRINE) 20-0.9 MG/250ML-% infusion  0-100 mcg/min Intravenous Titrated Leary Roca, PA-C   Stopped at 04/11/20 0600  . sodium chloride flush (NS) 0.9 % injection 3 mL  3 mL Intravenous Q12H Roddenberry, Myron G, PA-C   3 mL at 04/10/20 0940  . sodium chloride flush (NS) 0.9 % injection 3 mL  3 mL Intravenous PRN Roddenberry, Myron G, PA-C      . traMADol (ULTRAM) tablet 50 mg  50 mg Oral Q4H PRN Alleen Borne, MD      . traZODone (DESYREL) tablet 50 mg  50 mg Oral QHS Thedore Mins, MD        Musculoskeletal: Strength & Muscle Tone: not tested Gait & Station: not tested Patient leans: N/A  Psychiatric Specialty Exam: Physical Exam Psychiatric:        Attention and Perception: Attention normal.        Mood and Affect: Affect is flat.        Speech: Speech normal.        Behavior: Behavior normal.        Thought Content: Thought content normal.        Cognition and Memory: Cognition normal.        Judgment: Judgment is impulsive.     Review of Systems  Constitutional: Positive for appetite change.  HENT: Negative.   Eyes: Negative.   Psychiatric/Behavioral: Positive for sleep disturbance.    Blood pressure (!) 103/57, pulse 84, temperature 98.5 F (36.9 C), resp. rate (!) 24, height 5\' 7"  (1.702 m), weight 67.6 kg, SpO2 98 %.Body mass index is 23.34 kg/m.  General Appearance: Casual  Eye Contact:  Good  Speech:  Clear and Coherent and Normal Rate  Volume:  Normal  Mood:  Dysphoric  Affect:  Constricted  Thought Process:  Coherent and Linear  Orientation:  Full (Time, Place, and Person)  Thought Content:  Logical  Suicidal Thoughts:  denies suicide today but unable to contract for safety  Homicidal Thoughts:  No  Memory:  Immediate;   Good Recent;   Good Remote;   Good  Judgement:  Other:  marginal  Insight:  Shallow   Psychomotor Activity:  Psychomotor Retardation  Concentration:  Concentration: Fair and Attention Span: Fair  Recall:  Good  Fund of Knowledge:  Good  Language:  Good  Akathisia:  No  Handed:  Right  AIMS (if indicated):     Assets:  Communication  Skills Desire for Improvement  ADL's: marginal  Cognition:  WNL  Sleep:    poor     Treatment Plan Summary: 64 year old male who is 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 He tried to attempt suicide last night by wrapping a cord around his neck. He reports off and on depression for the past 5 years relating to his wife death and history of suicidal thoughts many years ago after his first wife left him. Currently, patient identify his medical problem, surgery and being lonely as stressor. He will benefit from inpatient psychiatric admission after he is medically cleared.  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 -Consider social worker consult to facilitate Geriatric psychiatric inpatient referral after patient is medically cleared   Disposition: Recommend psychiatric Inpatient admission when medically cleared. Supportive therapy provided about ongoing stressors. Psychiatric service signing out. Re-consult as needed  Thedore Mins, MD 04/11/2020 12:46 PM

## 2020-04-11 NOTE — Progress Notes (Signed)
Patient ID: Scott Rios, male   DOB: 29-Nov-1955, 64 y.o.   MRN: [id number] TCTS DAILY ICU PROGRESS NOTE                   [address]            [place] [address]          [phone]   2 Days Post-Op Procedure(s) (LRB): CORONARY ARTERY BYPASS GRAFTING (CABG) X 6 USING LIMA TO LAD; ENDOSCOPICALLY HARVESTED RIGHT GREATER SAPHENOUS VEIN: SVG to D1; SVG seq to OM2 and OM3; SVG seq to PD and PL. AORTIC VALVE REPLACEMENT USING INSPIRIS RESILIA 23 MM AORTIC VALVE. 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 LEFT ATRIAL APPENDAGE CLIPPING USING ATRICURE PRO240 40 MM CLIP. (N/A) AORTIC VALVE REPLACEMENT (AVR) USING EDWARDS INSPIRIS RESILIA 23 MM AORTIC VALVE. (N/A) MITRAL VALVE REPLACEMENT (MVR) USING MEMO 4D 28 MM MITRAL VALVE (N/A) TRANSESOPHAGEAL ECHOCARDIOGRAM (TEE) (N/A) ENDOVEIN HARVEST OF GREATER SAPHENOUS VEIN (Right) CLIPPING OF ATRIAL APPENDAGE USING  ATRICURE [id number] ATRICLIP (Left)  Total Length of Stay:  LOS: 14 days   Subjective: Patient awake alert neuro intact sitting in chair.  As noted in the chart use found during the night wrapping the call bell cord around his neck and told the nurse that he would kill himself now or later-sitter has been in the room to watch him one-on-one.  We have made multiple attempts to contact psychiatry to see as a consult-now assured that someone will see him from psychiatry today.   Objective: Vital signs in last 24 hours: Temp:  [97.9 F (36.6 C)-98.6 F (37 C)] 97.9 F (36.6 C) (08/28 0344) Pulse Rate:  [79-84] 84 (08/28 0430) Cardiac Rhythm: Normal sinus rhythm (08/28 0800) Resp:  [17-35] 24 (08/28 1000) BP: (85-138)/(50-111) 103/57 (08/28 1000) SpO2:  [97 %-100 %] 98 % (08/28 0430) Arterial Line BP: (109)/(48) 109/48 (08/27 1200) Weight:  [67.6 kg] 67.6 kg (08/28 0500)  Filed Weights   04/07/20 0351 04/09/20 0412 04/11/20 0500  Weight: 66.5 kg 64.9 kg 67.6 kg    Weight change:    Hemodynamic parameters  for last 24 hours:    Intake/Output from previous day: 08/27 0701 - 08/28 0700 In: 1043.2 [I.V.:213.2; Blood:630; IV Piggyback:200] Out: 2380 [Urine:1600; Chest Tube:780]  Intake/Output this shift: Total I/O In: 1025.7 [P.O.:240; I.V.:785.7] Out: 295 [Urine:135; Chest Tube:160]  Current Meds: Scheduled Meds: . (feeding supplement) PROSource Plus  30 mL Oral BID BM  . sodium chloride   Intravenous Once  . acetaminophen  1,000 mg Oral Q6H   Or  . acetaminophen (TYLENOL) oral liquid 160 mg/5 mL  1,000 mg Per Tube Q6H  . aspirin EC  325 mg Oral Daily   Or  . aspirin  324 mg Per Tube Daily  . atorvastatin  80 mg Oral Daily  . bisacodyl  10 mg Oral Daily   Or  . bisacodyl  10 mg Rectal Daily  . chlorhexidine  15 mL Mouth Rinse BID  . Chlorhexidine Gluconate Cloth  6 each Topical Daily  . docusate sodium  200 mg Oral Daily  . enoxaparin (LOVENOX) injection  40 mg Subcutaneous QHS  . feeding supplement (GLUCERNA SHAKE)  237 mL Oral BID BM  . ferrous fumarate-b12-vitamic C-folic acid  1 capsule Oral BID PC  . insulin aspart  0-24 Units Subcutaneous Q4H  . insulin detemir  20 Units Subcutaneous Daily  . mouth rinse  15 mL Mouth Rinse q12n4p  .  pantoprazole  40 mg Oral Daily  . sodium chloride flush  3 mL Intravenous Q12H   Continuous Infusions: . sodium chloride    . sodium chloride    . sodium chloride 20 mL/hr at 04/09/20 1743  . lactated ringers    . lactated ringers 20 mL/hr at 04/09/20 2000  . nitroGLYCERIN Stopped (04/09/20 1906)  . phenylephrine (NEO-SYNEPHRINE) Adult infusion Stopped (04/11/20 0600)   PRN Meds:.sodium chloride, morphine injection, ondansetron (ZOFRAN) IV, oxyCODONE, sodium chloride flush, traMADol  General appearance: alert and appears stated age Neurologic: intact Heart: regular rate and rhythm, S1, S2 normal, no murmur, click, rub or gallop Lungs: diminished breath sounds bibasilar Abdomen: soft, non-tender; bowel sounds normal; no masses,  no  organomegaly Extremities: extremities normal, atraumatic, no cyanosis or edema Wound: Sternum intact  Lab Results: CBC: Recent Labs    04/10/20 1300 04/11/20 0324  WBC 12.8* 15.2*  HGB 7.9* 7.8*  HCT 23.5* 23.1*  PLT 155 153   BMET:  Recent Labs    04/10/20 0249 04/11/20 0324  NA 132* 132*  K 4.3 3.9  CL 102 100  CO2 21* 25  GLUCOSE 121* 82  BUN 10 16  CREATININE 0.58* 0.64  CALCIUM 7.9* 8.4*    CMET: Lab Results  Component Value Date   WBC 15.2 (H) 04/11/2020   HGB 7.8 (L) 04/11/2020   HCT 23.1 (L) 04/11/2020   PLT 153 04/11/2020   GLUCOSE 82 04/11/2020   ALT 19 04/09/2020   AST 17 04/09/2020   NA 132 (L) 04/11/2020   K 3.9 04/11/2020   CL 100 04/11/2020   CREATININE 0.64 04/11/2020   BUN 16 04/11/2020   CO2 25 04/11/2020   TSH 2.465 03/29/2020   INR 1.4 (H) 04/09/2020   HGBA1C 10.0 (H) 04/09/2020      PT/INR:  Recent Labs    04/09/20 2212  LABPROT 16.4*  INR 1.4*   Radiology: No results found.   Assessment/Plan: S/P Procedure(s) (LRB): CORONARY ARTERY BYPASS GRAFTING (CABG) X 6 USING LIMA TO LAD; ENDOSCOPICALLY HARVESTED RIGHT GREATER SAPHENOUS VEIN: SVG to D1; SVG seq to OM2 and OM3; SVG seq to PD and PL. AORTIC VALVE REPLACEMENT USING INSPIRIS RESILIA 23 MM AORTIC VALVE. 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 LEFT ATRIAL APPENDAGE CLIPPING USING ATRICURE PRO240 40 MM CLIP. (N/A) AORTIC VALVE REPLACEMENT (AVR) USING EDWARDS INSPIRIS RESILIA 23 MM AORTIC VALVE. (N/A) MITRAL VALVE REPLACEMENT (MVR) USING MEMO 4D 28 MM MITRAL VALVE (N/A) TRANSESOPHAGEAL ECHOCARDIOGRAM (TEE) (N/A) ENDOVEIN HARVEST OF GREATER SAPHENOUS VEIN (Right) CLIPPING OF ATRIAL APPENDAGE USING  ATRICURE [id number] ATRICLIP (Left) Mobilize Diuresis Hemoglobin stable from yesterday 7.8 Sodium is stabilized at 132 DC central line-  Psychs consult pending-currently one-on-one observation   Delight Ovens 04/11/2020 11:44 AM

## 2020-04-11 NOTE — Progress Notes (Signed)
Nurse tech walked in the room for blood sugar check and found the patient wrapping his pulse oximetry cord around his neck. Patient stated he intended to end his life but was stopped before he could cause any harm. Patient says "it's either now or when I get home." Dr. Tyrone Sage called and notified of the event. High risk Suicide precautions have been implemented, psych has been paged. Nurse tech sitting in the room with patient.

## 2020-04-11 NOTE — Progress Notes (Signed)
Physical Therapy Treatment Patient Details Name: Scott Rios MRN: [id number] DOB: 1956/06/27 Today's Date: 04/11/2020    History of Present Illness 64 yo admitted 8/13 with acute pulmonary edema, Severe AR and moderate MR. 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 PMHx: Dm, HTN, ETOH and tobacco abuse    PT Comments    Pt sitting in recliner with sitter present throughout session. Pt able to rise and walk limited distance in room with close chair follow. Pt educated for sternal precautions, transfers, gait and function with goals and D/C plan updated. Pt reports he is unsure if he would have 24hr assist at home. Pt with decreased transfers, gait and cognition who will continue to benefit from acute therapy to maximize mobility with adherence to precautions to decrease burden of care.   BP 116/67 (81) pre gait 137/64 (84) post gait HR 84-93    Follow Up Recommendations  SNF;Supervision/Assistance - 24 hour;Home health PT (pending progression)     Equipment Recommendations  Rolling walker with 5" wheels    Recommendations for Other Services       Precautions / Restrictions Precautions Precautions: Sternal;Fall;Other (comment) Precaution Booklet Issued: Yes (comment) Precaution Comments: suicide, chest tube, external pacer    Mobility  Bed Mobility               General bed mobility comments: pt in chair on arrival and end of session  Transfers Overall transfer level: Needs assistance   Transfers: Sit to/from Stand Sit to Stand: Min assist         General transfer comment: cues for hands on thighs with assist to rise and stabilize with RW present  Ambulation/Gait Ambulation/Gait assistance: Min assist;+2 safety/equipment Gait Distance (Feet): 40 Feet Assistive device: Rolling walker (2 wheeled) Gait Pattern/deviations: Step-through pattern;Decreased stride length;Trunk flexed   Gait velocity interpretation: <1.8 ft/sec, indicate of risk for recurrent  falls General Gait Details: pt with hands on assist with cues and assist to control RW and gait with RLE weakness noted and difficulty advancing RLE at times. close chair follow with pt limited by fatigue   Stairs             Wheelchair Mobility    Modified Rankin (Stroke Patients Only)       Balance Overall balance assessment: Needs assistance   Sitting balance-Leahy Scale: Good     Standing balance support: Bilateral upper extremity supported Standing balance-Leahy Scale: Poor Standing balance comment: reliant on bil UE support in standing                            Cognition Arousal/Alertness: Awake/alert Behavior During Therapy: WFL for tasks assessed/performed Overall Cognitive Status: Impaired/Different from baseline Area of Impairment: Memory                     Memory: Decreased recall of precautions                Exercises General Exercises - Lower Extremity Long Arc Quad: AROM;Both;Seated;10 reps Hip Flexion/Marching: AROM;AAROM;Right;Left;Seated;10 reps (AAROM on RLE)    General Comments        Pertinent Vitals/Pain Pain Assessment: No/denies pain    Home Living                      Prior Function            PT Goals (current goals can now be  found in the care plan section) Acute Rehab PT Goals Patient Stated Goal: to go home Time For Goal Achievement: 04/25/20 Potential to Achieve Goals: Good Progress towards PT goals: Goals downgraded-see care plan    Frequency    Min 3X/week      PT Plan Discharge plan needs to be updated    Co-evaluation              AM-PAC PT "6 Clicks" Mobility   Outcome Measure  Help needed turning from your back to your side while in a flat bed without using bedrails?: A Little Help needed moving from lying on your back to sitting on the side of a flat bed without using bedrails?: A Little Help needed moving to and from a bed to a chair (including a  wheelchair)?: A Little Help needed standing up from a chair using your arms (e.g., wheelchair or bedside chair)?: A Little Help needed to walk in hospital room?: A Lot Help needed climbing 3-5 steps with a railing? : A Lot 6 Click Score: 16    End of Session Equipment Utilized During Treatment: Gait belt Activity Tolerance: Patient tolerated treatment well Patient left: in chair;with nursing/sitter in room Nurse Communication: Mobility status;Precautions PT Visit Diagnosis: Unsteadiness on feet (R26.81);Muscle weakness (generalized) (M62.81);Difficulty in walking, not elsewhere classified (R26.2);Other abnormalities of gait and mobility (R26.89)     Time: 7829-5621 PT Time Calculation (min) (ACUTE ONLY): 23 min  Charges:  $Gait Training: 8-22 mins                     Scott Rios, PT Acute Rehabilitation Services Pager: [phone] Office: [phone]    Scott Rios 04/11/2020, 1:32 PM

## 2020-04-12 ENCOUNTER — Inpatient Hospital Stay (HOSPITAL_COMMUNITY): Payer: Medicare Other

## 2020-04-12 DIAGNOSIS — L8990 Pressure ulcer of unspecified site, unspecified stage: Secondary | ICD-10-CM | POA: Insufficient documentation

## 2020-04-12 LAB — TYPE AND SCREEN
ABO/RH(D): O POS
Antibody Screen: NEGATIVE
Unit division: 0
Unit division: 0
Unit division: 0
Unit division: 0
Unit division: 0
Unit division: 0

## 2020-04-12 LAB — BPAM RBC
Blood Product Expiration Date: 202109192359
Blood Product Expiration Date: 202109212359
Blood Product Expiration Date: 202109222359
Blood Product Expiration Date: 202109222359
Blood Product Expiration Date: 202109242359
Blood Product Expiration Date: 202109242359
ISSUE DATE / TIME: 202108270618
ISSUE DATE / TIME: 202108270829
ISSUE DATE / TIME: 202108271052
ISSUE DATE / TIME: 202108271840
Unit Type and Rh: 5100
Unit Type and Rh: 5100
Unit Type and Rh: 5100
Unit Type and Rh: 5100
Unit Type and Rh: 5100
Unit Type and Rh: 5100

## 2020-04-12 LAB — GLUCOSE, CAPILLARY
Glucose-Capillary: 100 mg/dL — ABNORMAL HIGH (ref 70–99)
Glucose-Capillary: 163 mg/dL — ABNORMAL HIGH (ref 70–99)
Glucose-Capillary: 176 mg/dL — ABNORMAL HIGH (ref 70–99)
Glucose-Capillary: 184 mg/dL — ABNORMAL HIGH (ref 70–99)
Glucose-Capillary: 47 mg/dL — ABNORMAL LOW (ref 70–99)
Glucose-Capillary: 53 mg/dL — ABNORMAL LOW (ref 70–99)
Glucose-Capillary: 57 mg/dL — ABNORMAL LOW (ref 70–99)
Glucose-Capillary: 66 mg/dL — ABNORMAL LOW (ref 70–99)
Glucose-Capillary: 69 mg/dL — ABNORMAL LOW (ref 70–99)
Glucose-Capillary: 71 mg/dL (ref 70–99)
Glucose-Capillary: 87 mg/dL (ref 70–99)

## 2020-04-12 LAB — BASIC METABOLIC PANEL
Anion gap: 8 (ref 5–15)
BUN: 17 mg/dL (ref 8–23)
CO2: 26 mmol/L (ref 22–32)
Calcium: 8.7 mg/dL — ABNORMAL LOW (ref 8.9–10.3)
Chloride: 96 mmol/L — ABNORMAL LOW (ref 98–111)
Creatinine, Ser: 0.67 mg/dL (ref 0.61–1.24)
GFR calc Af Amer: >60 mL/min (ref >60)
GFR calc non Af Amer: >60 mL/min (ref >60)
Glucose, Bld: 97 mg/dL (ref 70–99)
Potassium: 3.9 mmol/L (ref 3.5–5.1)
Sodium: 130 mmol/L — ABNORMAL LOW (ref 135–145)

## 2020-04-12 LAB — CBC
HCT: 22.6 % — ABNORMAL LOW (ref 39.0–52.0)
Hemoglobin: 7.6 g/dL — ABNORMAL LOW (ref 13.0–17.0)
MCH: 30.9 pg (ref 26.0–34.0)
MCHC: 33.6 g/dL (ref 30.0–36.0)
MCV: 91.9 fL (ref 80.0–100.0)
Platelets: 156 10*3/uL (ref 150–400)
RBC: 2.46 MIL/uL — ABNORMAL LOW (ref 4.22–5.81)
RDW: 14.8 % (ref 11.5–15.5)
WBC: 17.5 10*3/uL — ABNORMAL HIGH (ref 4.0–10.5)
nRBC: 0 % (ref 0.0–0.2)

## 2020-04-12 LAB — PREPARE RBC (CROSSMATCH)

## 2020-04-12 MED ORDER — METOLAZONE 2.5 MG PO TABS
2.5000 mg | ORAL_TABLET | Freq: Once | ORAL | Status: AC
  Administered 2020-04-12: 2.5 mg via ORAL
  Filled 2020-04-12: qty 1

## 2020-04-12 MED ORDER — POTASSIUM CHLORIDE 10 MEQ/50ML IV SOLN: 10.0000 meq | INTRAVENOUS | Status: DC

## 2020-04-12 MED ORDER — POTASSIUM CHLORIDE CRYS ER 20 MEQ PO TBCR
40.0000 meq | EXTENDED_RELEASE_TABLET | Freq: Once | ORAL | Status: AC
  Administered 2020-04-12: 40 meq via ORAL
  Filled 2020-04-12: qty 2

## 2020-04-12 MED ORDER — GLUCOSE 40 % PO GEL
1.0000 | ORAL | Status: AC
  Administered 2020-04-12: 37.5 g via ORAL
  Filled 2020-04-12: qty 1

## 2020-04-12 MED ORDER — ENOXAPARIN SODIUM 30 MG/0.3ML ~~LOC~~ SOLN
30.0000 mg | SUBCUTANEOUS | Status: DC
  Administered 2020-04-12 – 2020-04-16 (×5): 30 mg via SUBCUTANEOUS
  Filled 2020-04-12 (×5): qty 0.3

## 2020-04-12 MED ORDER — SODIUM CHLORIDE 0.9% IV SOLUTION: Freq: Once | INTRAVENOUS | Status: AC

## 2020-04-12 MED ORDER — FUROSEMIDE 10 MG/ML IJ SOLN
40.0000 mg | Freq: Two times a day (BID) | INTRAMUSCULAR | Status: AC
  Administered 2020-04-12 (×2): 40 mg via INTRAVENOUS
  Filled 2020-04-12 (×2): qty 4

## 2020-04-12 NOTE — Progress Notes (Signed)
EVENING ROUNDS NOTE :     [address]       [place] [address]             [phone]                 3 Days Post-Op Procedure(s) (LRB): CORONARY ARTERY BYPASS GRAFTING (CABG) X 6 USING LIMA TO LAD; ENDOSCOPICALLY HARVESTED RIGHT GREATER SAPHENOUS VEIN: SVG to D1; SVG seq to OM2 and OM3; SVG seq to PD and PL. AORTIC VALVE REPLACEMENT USING INSPIRIS RESILIA 23 MM AORTIC VALVE. 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 LEFT ATRIAL APPENDAGE CLIPPING USING ATRICURE PRO240 40 MM CLIP. (N/A) AORTIC VALVE REPLACEMENT (AVR) USING EDWARDS INSPIRIS RESILIA 23 MM AORTIC VALVE. (N/A) MITRAL VALVE REPLACEMENT (MVR) USING MEMO 4D 28 MM MITRAL VALVE (N/A) TRANSESOPHAGEAL ECHOCARDIOGRAM (TEE) (N/A) ENDOVEIN HARVEST OF GREATER SAPHENOUS VEIN (Right) CLIPPING OF ATRIAL APPENDAGE USING  ATRICURE [id number] ATRICLIP (Left)  Total Length of Stay:  LOS: 15 days  BP 117/71   Pulse 72   Temp 98.7 F (37.1 C) (Oral)   Resp 14   Ht 5\' 7"  (1.702 m)   Wt 69.2 kg   SpO2 96%   BMI 23.89 kg/m   .Intake/Output      08/28 0701 - 08/29 0700 08/29 0701 - 08/30 0700   P.O. 720 360   I.V. (mL/kg) 785.7 (11.4)    Blood  315   IV Piggyback     Total Intake(mL/kg) 1505.7 (21.8) 675 (9.8)   Urine (mL/kg/hr) 785 (0.5) 590 (0.8)   Chest Tube 640    Total Output 1425 590   Net +80.7 +85          . sodium chloride    . sodium chloride    . sodium chloride 20 mL/hr at 04/09/20 1743  . lactated ringers    . lactated ringers 20 mL/hr at 04/09/20 2000  . nitroGLYCERIN Stopped (04/09/20 1906)  . phenylephrine (NEO-SYNEPHRINE) Adult infusion Stopped (04/11/20 0600)     Lab Results  Component Value Date   WBC 17.5 (H) 04/12/2020   HGB 7.6 (L) 04/12/2020   HCT 22.6 (L) 04/12/2020   PLT 156 04/12/2020   GLUCOSE 97 04/12/2020   ALT 19 04/09/2020   AST 17 04/09/2020   NA 130 (L) 04/12/2020   K 3.9 04/12/2020   CL 96 (L) 04/12/2020   CREATININE 0.67 04/12/2020   BUN 17 04/12/2020    CO2 26 04/12/2020   TSH 2.465 03/29/2020   INR 1.4 (H) 04/09/2020   HGBA1C 10.0 (H) 04/09/2020   Chest tube out  150 for 12 hours Given 1 unit prbc  04/11/2020 MD  Beeper [phone] Office [phone] 04/12/2020 6:18 PM

## 2020-04-12 NOTE — Progress Notes (Addendum)
CBG has been low for two consecutive night shifts Pt receives 20u levemir daily  Sunday 8/29   0000 - 53 juice given > CBG 57 juice given > CBG 100  1924 - 47 juice given > CBG 66 > juice given CBG 87  Monday 8/30 Gluco gel given for midnight CBG of 69 > 92

## 2020-04-12 NOTE — Progress Notes (Signed)
Patient ID: Scott Rios, male   DOB: Jun 24, 1956, 64 y.o.   MRN: [id number] TCTS DAILY ICU PROGRESS NOTE                   [address]            [place] [address]          [phone]   3 Days Post-Op Procedure(s) (LRB): CORONARY ARTERY BYPASS GRAFTING (CABG) X 6 USING LIMA TO LAD; ENDOSCOPICALLY HARVESTED RIGHT GREATER SAPHENOUS VEIN: SVG to D1; SVG seq to OM2 and OM3; SVG seq to PD and PL. AORTIC VALVE REPLACEMENT USING INSPIRIS RESILIA 23 MM AORTIC VALVE. 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 LEFT ATRIAL APPENDAGE CLIPPING USING ATRICURE PRO240 40 MM CLIP. (N/A) AORTIC VALVE REPLACEMENT (AVR) USING EDWARDS INSPIRIS RESILIA 23 MM AORTIC VALVE. (N/A) MITRAL VALVE REPLACEMENT (MVR) USING MEMO 4D 28 MM MITRAL VALVE (N/A) TRANSESOPHAGEAL ECHOCARDIOGRAM (TEE) (N/A) ENDOVEIN HARVEST OF GREATER SAPHENOUS VEIN (Right) CLIPPING OF ATRIAL APPENDAGE USING  ATRICURE [id number] ATRICLIP (Left)  Total Length of Stay:  LOS: 15 days   Subjective: Patient notes that he feels better since this a.m., said he was able to sleep having not been able to sleep for the last 3 months.  Objective: Vital signs in last 24 hours: Temp:  [98.3 F (36.8 C)-98.8 F (37.1 C)] 98.7 F (37.1 C) (08/29 0730) Pulse Rate:  [70-80] 80 (08/29 1100) Cardiac Rhythm: A-V Sequential paced (08/29 0400) Resp:  [15-28] 27 (08/29 1100) BP: (98-121)/(37-68) 120/49 (08/29 1100) SpO2:  [94 %-100 %] 99 % (08/29 1100) Weight:  [69.2 kg] 69.2 kg (08/29 0430)  Filed Weights   04/09/20 0412 04/11/20 0500 04/12/20 0430  Weight: 64.9 kg 67.6 kg 69.2 kg    Weight change: 1.6 kg   Hemodynamic parameters for last 24 hours:    Intake/Output from previous day: 08/28 0701 - 08/29 0700 In: 1505.7 [P.O.:720; I.V.:785.7] Out: 1425 [Urine:785; Chest Tube:640]  Intake/Output this shift: Total I/O In: -  Out: 40 [Urine:40]  Current Meds: Scheduled Meds: . (feeding supplement) PROSource Plus  30 mL  Oral BID BM  . sodium chloride   Intravenous Once  . sodium chloride   Intravenous Once  . acetaminophen  1,000 mg Oral Q6H   Or  . acetaminophen (TYLENOL) oral liquid 160 mg/5 mL  1,000 mg Per Tube Q6H  . aspirin EC  325 mg Oral Daily   Or  . aspirin  324 mg Per Tube Daily  . atorvastatin  80 mg Oral Daily  . bisacodyl  10 mg Oral Daily   Or  . bisacodyl  10 mg Rectal Daily  . chlorhexidine  15 mL Mouth Rinse BID  . Chlorhexidine Gluconate Cloth  6 each Topical Daily  . docusate sodium  200 mg Oral Daily  . enoxaparin (LOVENOX) injection  40 mg Subcutaneous QHS  . feeding supplement (GLUCERNA SHAKE)  237 mL Oral BID BM  . ferrous fumarate-b12-vitamic C-folic acid  1 capsule Oral BID PC  . furosemide  40 mg Intravenous BID  . insulin aspart  0-24 Units Subcutaneous Q4H  . insulin detemir  20 Units Subcutaneous Daily  . mouth rinse  15 mL Mouth Rinse q12n4p  . pantoprazole  40 mg Oral Daily  . sodium chloride flush  3 mL Intravenous Q12H  . traZODone  50 mg Oral QHS   Continuous Infusions: . sodium chloride    . sodium chloride    .  sodium chloride 20 mL/hr at 04/09/20 1743  . lactated ringers    . lactated ringers 20 mL/hr at 04/09/20 2000  . nitroGLYCERIN Stopped (04/09/20 1906)  . phenylephrine (NEO-SYNEPHRINE) Adult infusion Stopped (04/11/20 0600)   PRN Meds:.sodium chloride, morphine injection, ondansetron (ZOFRAN) IV, oxyCODONE, sodium chloride flush, traMADol  General appearance: alert, cooperative, appears older than stated age and no distress Neurologic: intact Heart: regular rate and rhythm, S1, S2 normal, no murmur, click, rub or gallop and Paced DDD-  Lungs: clear to auscultation bilaterally Abdomen: soft, non-tender; bowel sounds normal; no masses,  no organomegaly Extremities: extremities normal, atraumatic, no cyanosis or edema Wound: Sternum stable  Lab Results: CBC: Recent Labs    04/11/20 0324 04/12/20 0053  WBC 15.2* 17.5*  HGB 7.8* 7.6*  HCT  23.1* 22.6*  PLT 153 156   BMET:  Recent Labs    04/11/20 0324 04/12/20 0053  NA 132* 130*  K 3.9 3.9  CL 100 96*  CO2 25 26  GLUCOSE 82 97  BUN 16 17  CREATININE 0.64 0.67  CALCIUM 8.4* 8.7*    CMET: Lab Results  Component Value Date   WBC 17.5 (H) 04/12/2020   HGB 7.6 (L) 04/12/2020   HCT 22.6 (L) 04/12/2020   PLT 156 04/12/2020   GLUCOSE 97 04/12/2020   ALT 19 04/09/2020   AST 17 04/09/2020   NA 130 (L) 04/12/2020   K 3.9 04/12/2020   CL 96 (L) 04/12/2020   CREATININE 0.67 04/12/2020   BUN 17 04/12/2020   CO2 26 04/12/2020   TSH 2.465 03/29/2020   INR 1.4 (H) 04/09/2020   HGBA1C 10.0 (H) 04/09/2020      PT/INR:  Recent Labs    04/09/20 2212  LABPROT 16.4*  INR 1.4*   Radiology: [facility] Chest Port 1 View  Result Date: 04/12/2020 CLINICAL DATA:  Chest tube in place. EXAM: PORTABLE CHEST 1 VIEW COMPARISON:  April 11, 2020 FINDINGS: Bilateral chest tubes remain. Other support apparatus has been removed. Stable cardiomegaly. The hila and mediastinum are unremarkable. No pneumothorax. Small bilateral pleural effusions with atelectasis. No overt edema. No other changes. IMPRESSION: 1. Bilateral chest tubes remain. The remainder of the support apparatus has been removed. No pneumothorax. 2. Small bilateral pleural effusions with underlying atelectasis. Electronically Signed   By: Gerome Sam III M.D   On: 04/12/2020 10:46     Assessment/Plan: S/P Procedure(s) (LRB): CORONARY ARTERY BYPASS GRAFTING (CABG) X 6 USING LIMA TO LAD; ENDOSCOPICALLY HARVESTED RIGHT GREATER SAPHENOUS VEIN: SVG to D1; SVG seq to OM2 and OM3; SVG seq to PD and PL. AORTIC VALVE REPLACEMENT USING INSPIRIS RESILIA 23 MM AORTIC VALVE. 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 LEFT ATRIAL APPENDAGE CLIPPING USING ATRICURE PRO240 40 MM CLIP. (N/A) AORTIC VALVE REPLACEMENT (AVR) USING EDWARDS INSPIRIS RESILIA 23 MM AORTIC VALVE. (N/A) MITRAL VALVE REPLACEMENT (MVR) USING MEMO 4D 28 MM  MITRAL VALVE (N/A) TRANSESOPHAGEAL ECHOCARDIOGRAM (TEE) (N/A) ENDOVEIN HARVEST OF GREATER SAPHENOUS VEIN (Right) CLIPPING OF ATRIAL APPENDAGE USING  ATRICURE [id number] ATRICLIP (Left) Sodium 130-132 range Plan DC Foley Increase ambulation Still draining pleural fluid from the bilateral pleural tubes 650-likely removed tomorrow Continue on one-to-one observation with sitter Hemoglobin to 7.6 hematocrit 22-we will transfuse 1 unit of blood Lovenox for DVT prophylaxis  Delight Ovens 04/12/2020 11:23 AM

## 2020-04-13 ENCOUNTER — Encounter (HOSPITAL_COMMUNITY): Payer: Self-pay | Admitting: Surgery

## 2020-04-13 ENCOUNTER — Inpatient Hospital Stay (HOSPITAL_COMMUNITY): Payer: Medicare Other

## 2020-04-13 LAB — BPAM RBC
Blood Product Expiration Date: 202109252359
ISSUE DATE / TIME: 202108291235
Unit Type and Rh: 5100

## 2020-04-13 LAB — BASIC METABOLIC PANEL
Anion gap: 11 (ref 5–15)
BUN: 20 mg/dL (ref 8–23)
CO2: 25 mmol/L (ref 22–32)
Calcium: 8.4 mg/dL — ABNORMAL LOW (ref 8.9–10.3)
Chloride: 92 mmol/L — ABNORMAL LOW (ref 98–111)
Creatinine, Ser: 0.63 mg/dL (ref 0.61–1.24)
GFR calc Af Amer: >60 mL/min (ref >60)
GFR calc non Af Amer: >60 mL/min (ref >60)
Glucose, Bld: 144 mg/dL — ABNORMAL HIGH (ref 70–99)
Potassium: 3.6 mmol/L (ref 3.5–5.1)
Sodium: 128 mmol/L — ABNORMAL LOW (ref 135–145)

## 2020-04-13 LAB — CBC
HCT: 25.6 % — ABNORMAL LOW (ref 39.0–52.0)
Hemoglobin: 8.7 g/dL — ABNORMAL LOW (ref 13.0–17.0)
MCH: 30.3 pg (ref 26.0–34.0)
MCHC: 34 g/dL (ref 30.0–36.0)
MCV: 89.2 fL (ref 80.0–100.0)
Platelets: 191 10*3/uL (ref 150–400)
RBC: 2.87 MIL/uL — ABNORMAL LOW (ref 4.22–5.81)
RDW: 15.5 % (ref 11.5–15.5)
WBC: 16.1 10*3/uL — ABNORMAL HIGH (ref 4.0–10.5)
nRBC: 0 % (ref 0.0–0.2)

## 2020-04-13 LAB — TYPE AND SCREEN
ABO/RH(D): O POS
Antibody Screen: NEGATIVE
Unit division: 0

## 2020-04-13 LAB — GLUCOSE, CAPILLARY
Glucose-Capillary: 100 mg/dL — ABNORMAL HIGH (ref 70–99)
Glucose-Capillary: 126 mg/dL — ABNORMAL HIGH (ref 70–99)
Glucose-Capillary: 126 mg/dL — ABNORMAL HIGH (ref 70–99)
Glucose-Capillary: 144 mg/dL — ABNORMAL HIGH (ref 70–99)
Glucose-Capillary: 241 mg/dL — ABNORMAL HIGH (ref 70–99)
Glucose-Capillary: 92 mg/dL (ref 70–99)
Glucose-Capillary: 97 mg/dL (ref 70–99)

## 2020-04-13 LAB — SURGICAL PATHOLOGY

## 2020-04-13 MED ORDER — POTASSIUM CHLORIDE CRYS ER 20 MEQ PO TBCR
20.0000 meq | EXTENDED_RELEASE_TABLET | Freq: Three times a day (TID) | ORAL | Status: AC
  Administered 2020-04-14 – 2020-04-16 (×8): 20 meq via ORAL
  Filled 2020-04-13 (×9): qty 1

## 2020-04-13 MED ORDER — WARFARIN - PHYSICIAN DOSING INPATIENT: Freq: Every day | Status: DC

## 2020-04-13 MED ORDER — POTASSIUM CHLORIDE CRYS ER 20 MEQ PO TBCR
20.0000 meq | EXTENDED_RELEASE_TABLET | ORAL | Status: AC
  Administered 2020-04-13 (×3): 20 meq via ORAL
  Filled 2020-04-13 (×3): qty 1

## 2020-04-13 MED ORDER — SENNOSIDES-DOCUSATE SODIUM 8.6-50 MG PO TABS
1.0000 | ORAL_TABLET | Freq: Two times a day (BID) | ORAL | Status: DC | PRN
  Administered 2020-04-16: 1 via ORAL
  Filled 2020-04-13: qty 1

## 2020-04-13 MED ORDER — INSULIN DETEMIR 100 UNIT/ML ~~LOC~~ SOLN
10.0000 [IU] | Freq: Every day | SUBCUTANEOUS | Status: DC
  Administered 2020-04-13 – 2020-04-18 (×6): 10 [IU] via SUBCUTANEOUS
  Filled 2020-04-13 (×6): qty 0.1

## 2020-04-13 MED ORDER — WARFARIN SODIUM 2.5 MG PO TABS
2.5000 mg | ORAL_TABLET | Freq: Every day | ORAL | Status: DC
  Administered 2020-04-13 – 2020-04-14 (×2): 2.5 mg via ORAL
  Filled 2020-04-13 (×2): qty 1

## 2020-04-13 MED ORDER — TAMSULOSIN HCL 0.4 MG PO CAPS
0.4000 mg | ORAL_CAPSULE | Freq: Every day | ORAL | Status: DC
  Administered 2020-04-13 – 2020-04-18 (×6): 0.4 mg via ORAL
  Filled 2020-04-13 (×6): qty 1

## 2020-04-13 MED ORDER — FUROSEMIDE 40 MG PO TABS
40.0000 mg | ORAL_TABLET | Freq: Every day | ORAL | Status: DC
  Administered 2020-04-13: 40 mg via ORAL
  Filled 2020-04-13 (×2): qty 1

## 2020-04-13 MED ORDER — METOLAZONE 5 MG PO TABS
5.0000 mg | ORAL_TABLET | ORAL | Status: AC
  Administered 2020-04-13 – 2020-04-15 (×3): 5 mg via ORAL
  Filled 2020-04-13 (×3): qty 1

## 2020-04-13 MED ORDER — INSULIN ASPART 100 UNIT/ML ~~LOC~~ SOLN
0.0000 [IU] | Freq: Three times a day (TID) | SUBCUTANEOUS | Status: DC
  Administered 2020-04-13: 2 [IU] via SUBCUTANEOUS
  Administered 2020-04-13: 8 [IU] via SUBCUTANEOUS
  Administered 2020-04-13: 2 [IU] via SUBCUTANEOUS
  Administered 2020-04-14 – 2020-04-15 (×3): 4 [IU] via SUBCUTANEOUS
  Administered 2020-04-15: 2 [IU] via SUBCUTANEOUS
  Administered 2020-04-15 – 2020-04-17 (×4): 8 [IU] via SUBCUTANEOUS
  Administered 2020-04-17: 2 [IU] via SUBCUTANEOUS

## 2020-04-13 MED FILL — Mannitol IV Soln 20%: INTRAVENOUS | Qty: 500 | Status: AC

## 2020-04-13 MED FILL — Sodium Chloride IV Soln 0.9%: INTRAVENOUS | Qty: 2000 | Status: AC

## 2020-04-13 MED FILL — Potassium Chloride Inj 2 mEq/ML: INTRAVENOUS | Qty: 40 | Status: AC

## 2020-04-13 MED FILL — Magnesium Sulfate Inj 50%: INTRAMUSCULAR | Qty: 10 | Status: AC

## 2020-04-13 MED FILL — Heparin Sodium (Porcine) Inj 1000 Unit/ML: INTRAMUSCULAR | Qty: 30 | Status: AC

## 2020-04-13 MED FILL — Heparin Sodium (Porcine) Inj 1000 Unit/ML: INTRAMUSCULAR | Qty: 10 | Status: AC

## 2020-04-13 MED FILL — Albumin, Human Inj 5%: INTRAVENOUS | Qty: 250 | Status: AC

## 2020-04-13 MED FILL — Sodium Bicarbonate IV Soln 8.4%: INTRAVENOUS | Qty: 100 | Status: AC

## 2020-04-13 MED FILL — Lidocaine HCl Local Soln Prefilled Syringe 100 MG/5ML (2%): INTRAMUSCULAR | Qty: 5 | Status: AC

## 2020-04-13 MED FILL — Electrolyte-R (PH 7.4) Solution: INTRAVENOUS | Qty: 4000 | Status: AC

## 2020-04-13 NOTE — Progress Notes (Signed)
Transitions of Care Pharmacist Note  REASON HELZER is a 64 y.o. male that has been diagnosed with A Fib and will be prescribed Coumadin (warfarin)  at discharge.   Patient Education: I provided the following education on warfarin to the patient: How to take the medication Described what the medication is Signs of bleeding Signs/symptoms of VTE and stroke  Answered their questions Dietary and medication precautions with warfarin   Discharge Medications Plan: The patient wants to have their discharge medications filled by the Transitions of Care pharmacy rather than their usual pharmacy.  The discharge orders pharmacy has been changed to the Transitions of Care pharmacy, the patient will receive a phone call regarding co-pay, and their medications will be delivered by the Transitions of Care pharmacy.    Thank you,   Trixie Rude, PharmD PGY1 Acute Care Pharmacy Resident  04/13/2020 7:10 PM  Please check [email or web address] for unit-specific pharmacy phone numbers.

## 2020-04-13 NOTE — Progress Notes (Signed)
Inpatient Diabetes Program Recommendations  AACE/ADA: New Consensus Statement on Inpatient Glycemic Control (2015)  Target Ranges:  Prepandial:   less than 140 mg/dL      Peak postprandial:   less than 180 mg/dL (1-2 hours)      Critically ill patients:  140 - 180 mg/dL   Lab Results  Component Value Date   GLUCAP 126 (H) 04/13/2020   HGBA1C 10.0 (H) 04/09/2020    Review of Glycemic Control  Results for XERXES, AGRUSA (MRN [id number]) as of 04/13/2020 10:28  Ref. Range 04/12/2020 20:20 04/12/2020 23:43 04/13/2020 00:22 04/13/2020 03:40 04/13/2020 07:42  Glucose-Capillary Latest Ref Range: 70 - 99 mg/dL 87 69 (L) 92 97 126 (H)   Diabetes history:Type 2 DM Outpatient Diabetes medications:none Current orders for Inpatient glycemic control:Lantus 10 units QHS, Novolog 0-24 units TID  Inpatient Diabetes Program Recommendations:  Noted hypoglycemia yesterday of 47 mg/dL and subsequent insulin adjustments. In agreement. Following.  Blood glucose meter kit (inlcudes lancets and strips) (#[id number]).  Thanks, [name], MSN, RNC-OB Diabetes Coordinator [phone] (8a-5p)

## 2020-04-13 NOTE — Progress Notes (Signed)
Physical Therapy Treatment Patient Details Name: Scott Rios MRN: [id number] DOB: 11/29/1955 Today's Date: 04/13/2020    History of Present Illness 64 yo admitted 8/13 with acute pulmonary edema, Severe AR and moderate MR. 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 PMHx: Dm, HTN, ETOH and tobacco abuse    PT Comments    Pt happy to see therapist arrive and willing to improve gait. Pt able to recall 3 precautions and educated for all with transfers and mobility. Pt continues to be limited by RLE weakness but increasing to 2 gait trials with chair follow this date. Pt educated for continued progression and HEP with sitter present throughout.   HR 49-69 BP 128/69 (88) pre gait Post gait 146/67 (91) SpO2 100% on RA    Follow Up Recommendations  SNF;Supervision/Assistance - 24 hour;Home health PT     Equipment Recommendations  Rolling walker with 5" wheels    Recommendations for Other Services       Precautions / Restrictions Precautions Precautions: Sternal;Fall;Other (comment) Precaution Comments: suicide, external pacer Restrictions Weight Bearing Restrictions: Yes (sternal precautions)    Mobility  Bed Mobility Overal bed mobility: Needs Assistance Bed Mobility: Sidelying to Sit;Rolling Rolling: Min assist Sidelying to sit: Min assist       General bed mobility comments: cues for sequence with assist to rotate trunk and elevate trunk from surface  Transfers Overall transfer level: Needs assistance   Transfers: Sit to/from Stand Sit to Stand: Min assist         General transfer comment: cues for hands on thighs with assist to rise and stabilize with RW present  Ambulation/Gait Ambulation/Gait assistance: Min assist;+2 safety/equipment Gait Distance (Feet): 45 Feet Assistive device: Rolling walker (2 wheeled) Gait Pattern/deviations: Step-through pattern;Decreased stride length;Trunk flexed   Gait velocity interpretation: >2.62 ft/sec, indicative of  community ambulatory General Gait Details: pt with cues for slow steady gait without knee buckling today with pt able to walk 35' then 60' with seated rest and chair follow throughout   Stairs             Wheelchair Mobility    Modified Rankin (Stroke Patients Only)       Balance Overall balance assessment: Needs assistance   Sitting balance-Leahy Scale: Good     Standing balance support: Bilateral upper extremity supported Standing balance-Leahy Scale: Poor Standing balance comment: reliant on bil UE support in standing                            Cognition Arousal/Alertness: Awake/alert Behavior During Therapy: WFL for tasks assessed/performed Overall Cognitive Status: Impaired/Different from baseline                       Memory: Decreased recall of precautions         General Comments: pt able to recall 3 precautions with education for all      Exercises General Exercises - Lower Extremity Long Arc Quad: AROM;Both;Seated;10 reps Hip Flexion/Marching: AROM;AAROM;Right;Left;Seated;10 reps (AAROM on RLE)    General Comments        Pertinent Vitals/Pain Faces Pain Scale: Hurts a little bit Pain Location: RLE with HEP Pain Descriptors / Indicators: Aching Pain Intervention(s): Limited activity within patient's tolerance;Repositioned    Home Living                      Prior Function  PT Goals (current goals can now be found in the care plan section) Progress towards PT goals: Progressing toward goals    Frequency    Min 3X/week      PT Plan Current plan remains appropriate    Co-evaluation              AM-PAC PT "6 Clicks" Mobility   Outcome Measure  Help needed turning from your back to your side while in a flat bed without using bedrails?: A Little Help needed moving from lying on your back to sitting on the side of a flat bed without using bedrails?: A Little Help needed moving to and  from a bed to a chair (including a wheelchair)?: A Little Help needed standing up from a chair using your arms (e.g., wheelchair or bedside chair)?: A Little Help needed to walk in hospital room?: A Lot Help needed climbing 3-5 steps with a railing? : A Lot 6 Click Score: 16    End of Session Equipment Utilized During Treatment: Gait belt Activity Tolerance: Patient tolerated treatment well Patient left: in chair;with nursing/sitter in room Nurse Communication: Mobility status;Precautions PT Visit Diagnosis: Unsteadiness on feet (R26.81);Muscle weakness (generalized) (M62.81);Difficulty in walking, not elsewhere classified (R26.2);Other abnormalities of gait and mobility (R26.89)     Time: 1110-1135 PT Time Calculation (min) (ACUTE ONLY): 25 min  Charges:  $Gait Training: 8-22 mins $Therapeutic Exercise: 8-22 mins                     Orville Widmann P, PT Acute Rehabilitation Services Pager: [phone] Office: [phone]    Scott Rios B Scott Rios 04/13/2020, 1:03 PM

## 2020-04-13 NOTE — Progress Notes (Signed)
4 Days Post-Op Procedure(s) (LRB): CORONARY ARTERY BYPASS GRAFTING (CABG) X 6 USING LIMA TO LAD; ENDOSCOPICALLY HARVESTED RIGHT GREATER SAPHENOUS VEIN: SVG to D1; SVG seq to OM2 and OM3; SVG seq to PD and PL. AORTIC VALVE REPLACEMENT USING INSPIRIS RESILIA 23 MM AORTIC VALVE. 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 LEFT ATRIAL APPENDAGE CLIPPING USING ATRICURE PRO240 40 MM CLIP. (N/A) AORTIC VALVE REPLACEMENT (AVR) USING EDWARDS INSPIRIS RESILIA 23 MM AORTIC VALVE. (N/A) MITRAL VALVE REPLACEMENT (MVR) USING MEMO 4D 28 MM MITRAL VALVE (N/A) TRANSESOPHAGEAL ECHOCARDIOGRAM (TEE) (N/A) ENDOVEIN HARVEST OF GREATER SAPHENOUS VEIN (Right) CLIPPING OF ATRIAL APPENDAGE USING  ATRICURE [id number] ATRICLIP (Left) Subjective:  Feels better today after getting good nights sleep.  Hungry and eating breakfast  Reportedly had some urinary retention overnight with 900 cc on bladder scan but refused catheter. Was able to pass urine overnight. Objective: Vital signs in last 24 hours: Temp:  [97.7 F (36.5 C)-98.7 F (37.1 C)] 98.4 F (36.9 C) (08/30 0400) Pulse Rate:  [71-83] 72 (08/30 0700) Cardiac Rhythm: A-V Sequential paced (08/30 0400) Resp:  [13-27] 20 (08/30 0700) BP: (90-130)/(49-83) 115/74 (08/30 0700) SpO2:  [91 %-100 %] 98 % (08/30 0700) Weight:  [70.6 kg] 70.6 kg (08/30 0200)  Hemodynamic parameters for last 24 hours:    Intake/Output from previous day: 08/29 0701 - 08/30 0700 In: 1115 [P.O.:800; Blood:315] Out: 1365 [Urine:1365] Intake/Output this shift: No intake/output data recorded.  General appearance: alert and cooperative Neurologic: intact Heart: irregularly irregular rhythm Lungs: diminished breath sounds bibasilar Abdomen: soft, non-tender; bowel sounds normal; no masses,  no organomegaly Extremities: edema moderate LE edema Wound: incisions ok  Lab Results: Recent Labs    04/12/20 0053 04/13/20 0127  WBC 17.5* 16.1*  HGB 7.6* 8.7*  HCT 22.6* 25.6*  PLT  156 191   BMET:  Recent Labs    04/12/20 0053 04/13/20 0127  NA 130* 128*  K 3.9 3.6  CL 96* 92*  CO2 26 25  GLUCOSE 97 144*  BUN 17 20  CREATININE 0.67 0.63  CALCIUM 8.7* 8.4*    PT/INR: No results for input(s): LABPROT, INR in the last 72 hours. ABG    Component Value Date/Time   PHART 7.412 04/10/2020 0156   HCO3 22.0 04/10/2020 0156   TCO2 23 04/10/2020 0156   ACIDBASEDEF 2.0 04/10/2020 0156   O2SAT 99.0 04/10/2020 0156   CBG (last 3)  Recent Labs    04/12/20 2343 04/13/20 0022 04/13/20 0340  GLUCAP 69* 92 97   CXR: bibasilar atelectasis, probably small pleural effusions.  Assessment/Plan: S/P Procedure(s) (LRB): CORONARY ARTERY BYPASS GRAFTING (CABG) X 6 USING LIMA TO LAD; ENDOSCOPICALLY HARVESTED RIGHT GREATER SAPHENOUS VEIN: SVG to D1; SVG seq to OM2 and OM3; SVG seq to PD and PL. AORTIC VALVE REPLACEMENT USING INSPIRIS RESILIA 23 MM AORTIC VALVE. 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 LEFT ATRIAL APPENDAGE CLIPPING USING ATRICURE PRO240 40 MM CLIP. (N/A) AORTIC VALVE REPLACEMENT (AVR) USING EDWARDS INSPIRIS RESILIA 23 MM AORTIC VALVE. (N/A) MITRAL VALVE REPLACEMENT (MVR) USING MEMO 4D 28 MM MITRAL VALVE (N/A) TRANSESOPHAGEAL ECHOCARDIOGRAM (TEE) (N/A) ENDOVEIN HARVEST OF GREATER SAPHENOUS VEIN (Right) CLIPPING OF ATRIAL APPENDAGE USING  ATRICURE [id number] ATRICLIP (Left)  POD 4  Hemodynamically stable.  Rhythm is atrial fib/flutter 50's to 60's. I was not able to rapid atrial pace to sinus. Will turn pacer off but keep connected today and observe. No beta blocker or amio. Start Coumadin. He has left atrial clip.  Bibasilar atelectasis: work on IS.  Volume overload. Wt is 12 lbs over preop. Continue diuresis.  Hyponatremia: had this preop from diuresis. Na is trending down so observe and may use Tolvaptan if it continues to drop.  Spinal stenosis with left leg weakness on admission that improved with PT. Continue PT.  Poorly controlled DM on  no meds preop. Hypoglycemic yesterday. Will decrease Levemir to 10 and SSI with meals.  Major depressive disorder per psychiatry: sitter for safety. Continue current Trazodone dose at bedtime. He seems much better today after sleeping.  Keep in ICU today. He has too many issues going on and is 3 person assist to chair.   LOS: 16 days    Alleen Borne 04/13/2020

## 2020-04-13 NOTE — Anesthesia Postprocedure Evaluation (Signed)
Anesthesia Post Note  Patient: DSHAUN REPPUCCI  Procedure(s) Performed: CORONARY ARTERY BYPASS GRAFTING (CABG) X 6 USING LIMA TO LAD; ENDOSCOPICALLY HARVESTED RIGHT GREATER SAPHENOUS VEIN: SVG to D1; SVG seq to OM2 and OM3; SVG seq to PD and PL. AORTIC VALVE REPLACEMENT USING INSPIRIS RESILIA 23 MM AORTIC VALVE. 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 LEFT ATRIAL APPENDAGE CLIPPING USING ATRICURE PRO240 40 MM CLIP. (N/A Chest) AORTIC VALVE REPLACEMENT (AVR) USING EDWARDS INSPIRIS RESILIA 23 MM AORTIC VALVE. (N/A Chest) MITRAL VALVE REPLACEMENT (MVR) USING MEMO 4D 28 MM MITRAL VALVE (N/A Chest) TRANSESOPHAGEAL ECHOCARDIOGRAM (TEE) (N/A ) ENDOVEIN HARVEST OF GREATER SAPHENOUS VEIN (Right Leg Upper) CLIPPING OF ATRIAL APPENDAGE USING  ATRICURE [id number] ATRICLIP (Left Chest)     Patient location during evaluation: ICU Anesthesia Type: General Level of consciousness: sedated and patient remains intubated per anesthesia plan Pain management: pain level controlled Vital Signs Assessment: post-procedure vital signs reviewed and stable Respiratory status: patient remains intubated per anesthesia plan Cardiovascular status: stable Postop Assessment: no apparent nausea or vomiting Anesthetic complications: no   No complications documented.  Last Vitals:  Vitals:   04/13/20 1600 04/13/20 1615  BP:    Pulse: (!) 44   Resp: 17   Temp:  36.6 C  SpO2: 97%     Last Pain:  Vitals:   04/13/20 1615  TempSrc: Oral  PainSc:                  Beryle Lathe

## 2020-04-13 NOTE — Progress Notes (Signed)
CT surgery p.m. Rounds  Heart rate remains low blood pressure satisfactory and pacing wires connected Ambulated to short distances with PT today\ Blood pressure (!) 145/89, pulse (!) 44, temperature 97.8 F (36.6 C), temperature source Oral, resp. rate 17, height 5\' 7"  (1.702 m), weight 70.6 kg, SpO2 97 %.

## 2020-04-13 NOTE — Progress Notes (Addendum)
Urinary catheter was removed during previous shift around 6pm. Patient has not voided since. Bladder scan read >889 mL. Patient repeatedly refused in and out catheter. Patient insists voiding in urinal.  RN will assess patient's acceptance to try in and out catheter every hour.   Update 0200: 775 mL urine output in urinal.

## 2020-04-13 NOTE — Progress Notes (Signed)
Progress Note  Patient Name: Scott Rios Date of Encounter: 04/13/2020  Primary Cardiologist:   No primary care provider on file.   Subjective   Mild incisional pain but overall feels comfortable.  No SOB.   Inpatient Medications    Scheduled Meds: . (feeding supplement) PROSource Plus  30 mL Oral BID BM  . sodium chloride   Intravenous Once  . acetaminophen  1,000 mg Oral Q6H   Or  . acetaminophen (TYLENOL) oral liquid 160 mg/5 mL  1,000 mg Per Tube Q6H  . aspirin EC  325 mg Oral Daily   Or  . aspirin  324 mg Per Tube Daily  . atorvastatin  80 mg Oral Daily  . chlorhexidine  15 mL Mouth Rinse BID  . Chlorhexidine Gluconate Cloth  6 each Topical Daily  . enoxaparin (LOVENOX) injection  30 mg Subcutaneous Q24H  . feeding supplement (GLUCERNA SHAKE)  237 mL Oral BID BM  . ferrous fumarate-b12-vitamic C-folic acid  1 capsule Oral BID PC  . furosemide  40 mg Oral Daily  . insulin aspart  0-24 Units Subcutaneous TID WC  . insulin detemir  10 Units Subcutaneous Daily  . mouth rinse  15 mL Mouth Rinse q12n4p  . metolazone  5 mg Oral Q24H  . pantoprazole  40 mg Oral Daily  . potassium chloride  20 mEq Oral Q4H  . [START ON 04/14/2020] potassium chloride  20 mEq Oral TID  . sodium chloride flush  3 mL Intravenous Q12H  . tamsulosin  0.4 mg Oral Daily  . traZODone  50 mg Oral QHS  . warfarin  2.5 mg Oral q1600  . Warfarin - Physician Dosing Inpatient   Does not apply q1600   Continuous Infusions: . sodium chloride    . sodium chloride    . sodium chloride 20 mL/hr at 04/09/20 1743   PRN Meds: sodium chloride, morphine injection, ondansetron (ZOFRAN) IV, senna-docusate, sodium chloride flush, traMADol   Vital Signs    Vitals:   04/13/20 0500 04/13/20 0600 04/13/20 0700 04/13/20 0744  BP: (!) 105/58 121/74 115/74   Pulse: 72 72 72   Resp: 17 15 20    Temp:    98.3 F (36.8 C)  TempSrc:    Oral  SpO2: 95% 97% 98%   Weight:      Height:        Intake/Output  Summary (Last 24 hours) at 04/13/2020 0823 Last data filed at 04/13/2020 0152 Gross per 24 hour  Intake 1115 ml  Output 1325 ml  Net -210 ml   Filed Weights   04/11/20 0500 04/12/20 0430 04/13/20 0200  Weight: 67.6 kg 69.2 kg 70.6 kg    Telemetry    Atrial fib with slow ventricular rate.  No prolonged pauses - Personally Reviewed  ECG    NA - Personally Reviewed  Physical Exam   GEN: No acute distress.   Neck: No  JVD Cardiac: Irregular RR, no murmurs, rubs, or gallops.  Respiratory:   Decreased breath sounds with pleuritic rub.  GI: Soft, nontender, non-distended  MS:   Mild leg edema; No deformity. Neuro:  Nonfocal  Psych: Normal affect   Labs    Chemistry Recent Labs  Lab 04/09/20 0039 04/09/20 0803 04/11/20 0324 04/12/20 0053 04/13/20 0127  NA 124*   < > 132* 130* 128*  K 4.7   < > 3.9 3.9 3.6  CL 93*   < > 100 96* 92*  CO2 24   < >  25 26 25   GLUCOSE 180*   < > 82 97 144*  BUN 14   < > 16 17 20   CREATININE 0.84   < > 0.64 0.67 0.63  CALCIUM 8.8*   < > 8.4* 8.7* 8.4*  PROT 5.8*  --   --   --   --   ALBUMIN 3.2*  --   --   --   --   AST 17  --   --   --   --   ALT 19  --   --   --   --   ALKPHOS 69  --   --   --   --   BILITOT 0.5  --   --   --   --   GFRNONAA >60   < > >60 >60 >60  GFRAA >60   < > >60 >60 >60  ANIONGAP 7   < > 7 8 11    < > = values in this interval not displayed.     Hematology Recent Labs  Lab 04/11/20 0324 04/12/20 0053 04/13/20 0127  WBC 15.2* 17.5* 16.1*  RBC 2.53* 2.46* 2.87*  HGB 7.8* 7.6* 8.7*  HCT 23.1* 22.6* 25.6*  MCV 91.3 91.9 89.2  MCH 30.8 30.9 30.3  MCHC 33.8 33.6 34.0  RDW 14.8 14.8 15.5  PLT 153 156 191    Cardiac EnzymesNo results for input(s): TROPONINI in the last 168 hours. No results for input(s): TROPIPOC in the last 168 hours.   BNPNo results for input(s): BNP, PROBNP in the last 168 hours.   DDimer No results for input(s): DDIMER in the last 168 hours.   Radiology    DG Chest Port 1  View  Result Date: 04/13/2020 CLINICAL DATA:  Status post cardiac surgery. EXAM: PORTABLE CHEST 1 VIEW COMPARISON:  April 12, 2020. FINDINGS: Stable cardiomegaly. Status post aortic valve repair. No pneumothorax is noted. Mild bibasilar subsegmental atelectasis or infiltrates are noted with probable small pleural effusions. Bony thorax is unremarkable. IMPRESSION: Mild bibasilar subsegmental atelectasis or infiltrates are noted with probable small pleural effusions. Electronically Signed   By: 04/15/20 M.D.   On: 04/13/2020 08:12   DG Chest Port 1 View  Result Date: 04/12/2020 CLINICAL DATA:  Chest tube in place. EXAM: PORTABLE CHEST 1 VIEW COMPARISON:  April 11, 2020 FINDINGS: Bilateral chest tubes remain. Other support apparatus has been removed. Stable cardiomegaly. The hila and mediastinum are unremarkable. No pneumothorax. Small bilateral pleural effusions with atelectasis. No overt edema. No other changes. IMPRESSION: 1. Bilateral chest tubes remain. The remainder of the support apparatus has been removed. No pneumothorax. 2. Small bilateral pleural effusions with underlying atelectasis. Electronically Signed   By: 04/15/2020 III M.D   On: 04/12/2020 10:46    Cardiac Studies   Diagnostic Dominance: Right   TEE: Severe aortic insufficiency, moderate to severe mitral regurgitation, EF 35%   Patient Profile     64 y.o. male with a hx of DM and HTN, ETOH abuse and tobacco abusewho is being seen in consultation for the evaluation of acute pulmonary edema, LV dysfunction, moderate MR and severe ARat the request of Gerome Sam, MD.  Assessment & Plan    ATRIAL FIB:   Slow ventricular rate.  Starting warfarin.  Avoiding amiodarone and negative chronotropes.    ISCHEMIC CARDIOMYOPATHY:    Hyponatremia.   Net negative 14 liters.  Intake and output even over 24 hours.   XR without edema but with  small effusions.  On oral Lasix and zaroxolyn.  Restrict free water.  Agree,  consider tolvaptan if Na falls slower. Labile BP does not allow med titration for reduced EF.    CAD:  CABG day 4.     For questions or updates, please contact [facility] Please consult [email or web address] for contact info under Cardiology/STEMI.   Signed, Rollene Rotunda, MD  04/13/2020, 8:23 AM

## 2020-04-14 LAB — GLUCOSE, CAPILLARY
Glucose-Capillary: 89 mg/dL (ref 70–99)
Glucose-Capillary: 83 mg/dL (ref 70–99)
Glucose-Capillary: 157 mg/dL — ABNORMAL HIGH (ref 70–99)
Glucose-Capillary: 198 mg/dL — ABNORMAL HIGH (ref 70–99)
Glucose-Capillary: 177 mg/dL — ABNORMAL HIGH (ref 70–99)
Glucose-Capillary: 190 mg/dL — ABNORMAL HIGH (ref 70–99)

## 2020-04-14 LAB — BASIC METABOLIC PANEL
Anion gap: 9 (ref 5–15)
BUN: 21 mg/dL (ref 8–23)
CO2: 28 mmol/L (ref 22–32)
Calcium: 8.6 mg/dL — ABNORMAL LOW (ref 8.9–10.3)
Chloride: 92 mmol/L — ABNORMAL LOW (ref 98–111)
Creatinine, Ser: 0.69 mg/dL (ref 0.61–1.24)
GFR calc Af Amer: >60 mL/min (ref >60)
GFR calc non Af Amer: >60 mL/min (ref >60)
Glucose, Bld: 92 mg/dL (ref 70–99)
Potassium: 4.3 mmol/L (ref 3.5–5.1)
Sodium: 129 mmol/L — ABNORMAL LOW (ref 135–145)

## 2020-04-14 LAB — PROTIME-INR
INR: 1.2 (ref 0.8–1.2)
Prothrombin Time: 14.5 seconds (ref 11.4–15.2)

## 2020-04-14 MED ORDER — TORSEMIDE 20 MG PO TABS
20.0000 mg | ORAL_TABLET | Freq: Two times a day (BID) | ORAL | Status: DC
  Administered 2020-04-14 – 2020-04-18 (×8): 20 mg via ORAL
  Filled 2020-04-14 (×8): qty 1

## 2020-04-14 MED ORDER — ~~LOC~~ CARDIAC SURGERY, PATIENT & FAMILY EDUCATION: Freq: Once | Status: AC

## 2020-04-14 MED ORDER — SODIUM CHLORIDE 0.9 % IV SOLN: 250.0000 mL | INTRAVENOUS | Status: DC | PRN

## 2020-04-14 MED ORDER — SODIUM CHLORIDE 0.9% FLUSH
3.0000 mL | Freq: Two times a day (BID) | INTRAVENOUS | Status: DC
  Administered 2020-04-14 – 2020-04-17 (×7): 3 mL via INTRAVENOUS

## 2020-04-14 MED ORDER — CHLORHEXIDINE GLUCONATE CLOTH 2 % EX PADS
6.0000 | MEDICATED_PAD | Freq: Every day | CUTANEOUS | Status: DC
  Administered 2020-04-16: 6 via TOPICAL

## 2020-04-14 MED ORDER — SODIUM CHLORIDE 0.9% FLUSH: 3.0000 mL | INTRAVENOUS | Status: DC | PRN

## 2020-04-14 NOTE — Progress Notes (Addendum)
[facility] DAILY ICU PROGRESS NOTE                   [address]            [place] [address]          [phone]   5 Days Post-Op Procedure(s) (LRB): CORONARY ARTERY BYPASS GRAFTING (CABG) X 6 USING LIMA TO LAD; ENDOSCOPICALLY HARVESTED RIGHT GREATER SAPHENOUS VEIN: SVG to D1; SVG seq to OM2 and OM3; SVG seq to PD and PL. AORTIC VALVE REPLACEMENT USING INSPIRIS RESILIA 23 MM AORTIC VALVE. 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 LEFT ATRIAL APPENDAGE CLIPPING USING ATRICURE PRO240 40 MM CLIP. (N/A) AORTIC VALVE REPLACEMENT (AVR) USING EDWARDS INSPIRIS RESILIA 23 MM AORTIC VALVE. (N/A) MITRAL VALVE REPLACEMENT (MVR) USING MEMO 4D 28 MM MITRAL VALVE (N/A) TRANSESOPHAGEAL ECHOCARDIOGRAM (TEE) (N/A) ENDOVEIN HARVEST OF GREATER SAPHENOUS VEIN (Right) CLIPPING OF ATRIAL APPENDAGE USING  ATRICURE [id number] ATRICLIP (Left)  Total Length of Stay:  LOS: 17 days   Subjective: Awake and alert, up in the bedside chair with a sitter at the bedside.  He is pleased with the progress he is making. Requiring less assistance for mobility per OT note this am.  Appetite improving, he is having BM's (3 yesterday)  Objective: Vital signs in last 24 hours: Temp:  [97.7 F (36.5 C)-98.9 F (37.2 C)] 97.7 F (36.5 C) (08/31 0800) Pulse Rate:  [32-74] 74 (08/31 0800) Cardiac Rhythm: Atrial flutter (08/31 0800) Resp:  [11-23] 16 (08/31 0800) BP: (102-145)/(42-89) 124/68 (08/31 0800) SpO2:  [93 %-98 %] 93 % (08/31 0800) Weight:  [71.3 kg] 71.3 kg (08/31 0435)  Filed Weights   04/12/20 0430 04/13/20 0200 04/14/20 0435  Weight: 69.2 kg 70.6 kg 71.3 kg    Weight change: 0.7 kg     Intake/Output from previous day: 08/30 0701 - 08/31 0700 In: 940 [P.O.:940] Out: 950 [Urine:950]  Intake/Output this shift: Total I/O In: 360 [P.O.:360] Out: -   Current Meds: Scheduled Meds: . (feeding supplement) PROSource Plus  30 mL Oral BID BM  . sodium chloride   Intravenous Once  .  acetaminophen  1,000 mg Oral Q6H   Or  . acetaminophen (TYLENOL) oral liquid 160 mg/5 mL  1,000 mg Per Tube Q6H  . aspirin EC  325 mg Oral Daily   Or  . aspirin  324 mg Per Tube Daily  . atorvastatin  80 mg Oral Daily  . chlorhexidine  15 mL Mouth Rinse BID  . Chlorhexidine Gluconate Cloth  6 each Topical Daily  . enoxaparin (LOVENOX) injection  30 mg Subcutaneous Q24H  . feeding supplement (GLUCERNA SHAKE)  237 mL Oral BID BM  . ferrous fumarate-b12-vitamic C-folic acid  1 capsule Oral BID PC  . furosemide  40 mg Oral Daily  . insulin aspart  0-24 Units Subcutaneous TID WC  . insulin detemir  10 Units Subcutaneous Daily  . metolazone  5 mg Oral Q24H  . pantoprazole  40 mg Oral Daily  . potassium chloride  20 mEq Oral TID  . sodium chloride flush  3 mL Intravenous Q12H  . tamsulosin  0.4 mg Oral Daily  . traZODone  50 mg Oral QHS  . warfarin  2.5 mg Oral q1600  . Warfarin - Physician Dosing Inpatient   Does not apply q1600   Continuous Infusions: . sodium chloride    . sodium chloride    . sodium chloride 20 mL/hr at 04/09/20 1743  PRN Meds:.sodium chloride, morphine injection, ondansetron (ZOFRAN) IV, senna-docusate, sodium chloride flush, traMADol  General appearance: alert, cooperative and no distress Neurologic: No new deficits, has chronic LE weakness due to spinal stenosis.  Heart: Atrial fibrillation with slow VR in 40's-50's Lungs: Breath sounds are clear, sats acceptable on RA.  Abdomen: soft, NT.  Extremities: mild LE edema, Has SCD's in place Wound: The sternal incision and RLE EVH incisions are dry. .  Lab Results: CBC: Recent Labs    04/12/20 0053 04/13/20 0127  WBC 17.5* 16.1*  HGB 7.6* 8.7*  HCT 22.6* 25.6*  PLT 156 191   BMET:  Recent Labs    04/13/20 0127 04/14/20 0238  NA 128* 129*  K 3.6 4.3  CL 92* 92*  CO2 25 28  GLUCOSE 144* 92  BUN 20 21  CREATININE 0.63 0.69  CALCIUM 8.4* 8.6*    CMET: Lab Results  Component Value Date   WBC  16.1 (H) 04/13/2020   HGB 8.7 (L) 04/13/2020   HCT 25.6 (L) 04/13/2020   PLT 191 04/13/2020   GLUCOSE 92 04/14/2020   ALT 19 04/09/2020   AST 17 04/09/2020   NA 129 (L) 04/14/2020   K 4.3 04/14/2020   CL 92 (L) 04/14/2020   CREATININE 0.69 04/14/2020   BUN 21 04/14/2020   CO2 28 04/14/2020   TSH 2.465 03/29/2020   INR 1.2 04/14/2020   HGBA1C 10.0 (H) 04/09/2020      PT/INR:  Recent Labs    04/14/20 0238  LABPROT 14.5  INR 1.2   Radiology: No results found.   Assessment/Plan: S/P Procedure(s) (LRB): CORONARY ARTERY BYPASS GRAFTING (CABG) X 6 USING LIMA TO LAD; ENDOSCOPICALLY HARVESTED RIGHT GREATER SAPHENOUS VEIN: SVG to D1; SVG seq to OM2 and OM3; SVG seq to PD and PL. AORTIC VALVE REPLACEMENT USING INSPIRIS RESILIA 23 MM AORTIC VALVE. 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 LEFT ATRIAL APPENDAGE CLIPPING USING ATRICURE PRO240 40 MM CLIP. (N/A) AORTIC VALVE REPLACEMENT (AVR) USING EDWARDS INSPIRIS RESILIA 23 MM AORTIC VALVE. (N/A) MITRAL VALVE REPLACEMENT (MVR) USING MEMO 4D 28 MM MITRAL VALVE (N/A) TRANSESOPHAGEAL ECHOCARDIOGRAM (TEE) (N/A) ENDOVEIN HARVEST OF GREATER SAPHENOUS VEIN (Right) CLIPPING OF ATRIAL APPENDAGE USING  ATRICURE QGB201 ATRICLIP (Left)  --POD-5 CABG x 6, MV replacement with a 23mm bioprosthetic valve, MV annuloplasty, and clipping of left atrial appendage. BP stable. Overall progressing with mobility.   -Atrial fibrillation- slow VR. On no beta blocker or antiarrhythmics.  Will continue to observe, leave pacer attached. Anticoagulation initiated with Coumadin, INR 1.2. Will continue to dose conservatively for now.   -Hyponatremia- improved slightly. Hopefully will continue trend as oral intake improves. Continue to monitor.   -Volume excess- Wt ~10lbs above pre-op Wt. Continue diuresis with oral Lasix and metolazone.   -Expected acute blood loss anemia-  Hct trending up. On Fe++ supplement. Monitor.   -Chronic left leg weakness from  spinal stenosis- Continue PT / OT.  -History of major depression- Seems to be in better spirits. Continue trazodone as he was on pre-op.   -Disposition- plan transfer to [facility].  OT recommends discharge to SNF for further rehab. We will see how he progresses.      Leary Roca , PA-C [phone] 04/14/2020 10:01 AM   Chart reviewed, patient examined, agree with above. His weight continues to increase and I/O have been even despite lasix 40 and metolazone 5. Will switch lasix to torsemide bid to see if he responds better.  Alleen Borne, MD

## 2020-04-14 NOTE — Evaluation (Signed)
Occupational Therapy Evaluation Patient Details Name: Scott Rios MRN: [id number] DOB: 1956-08-06 Today's Date: 04/14/2020    History of Present Illness 64 yo admitted 8/13 with acute pulmonary edema, Severe AR and moderate MR. 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 PMHx: Dm, HTN, ETOH and tobacco abuse   Clinical Impression   Pt seen for re-eval now s/p CABG on 8/26. Pt requiring modA for sit<>stand and overall minA for room level mobility using RW (+2 safety/chair follow). Pt requiring minA for standing grooming ADL and up to modA for LB ADL. Pt continues to require increased assist given RLE weakness/instability. He is very pleasant and motivated to work/progress with therapies, able to verbalize sternal precautions with min cues provided during session completion. Pt to benefit from continued acute OT services and currently recommend post acute rehab services to maximize his overall safety and independence with ADL and mobility.   BP start of session 117/73 (85) End of session 131/87 (100) HR 60s-80s O2 >92% on RA     Follow Up Recommendations  SNF;Supervision/Assistance - 24 hour (pending progress)    Equipment Recommendations  3 in 1 bedside commode;Other (comment) (TBA)           Precautions / Restrictions Precautions Precautions: Sternal;Fall;Other (comment) Precaution Comments: suicide, external pacer Restrictions Weight Bearing Restrictions: Yes (sternal precautions)      Mobility Bed Mobility               General bed mobility comments: OOB in recliner upon arrival   Transfers Overall transfer level: Needs assistance Equipment used: Rolling walker (2 wheeled) Transfers: Sit to/from Stand Sit to Stand: Mod assist         General transfer comment: assist to power up to standing, VCs for hand placement with transitions, sit<>stand x3 during session     Balance Overall balance assessment: Needs assistance Sitting-balance support: No upper  extremity supported;Feet supported Sitting balance-Leahy Scale: Good     Standing balance support: Single extremity supported;Bilateral upper extremity supported;During functional activity Standing balance-Leahy Scale: Poor Standing balance comment: reliant on at least single UE support and external assist for stability during standing grooming ADL                           ADL either performed or assessed with clinical judgement   ADL Overall ADL's : Needs assistance/impaired     Grooming: Minimal assistance;Standing;Wash/dry face;Brushing hair Grooming Details (indicate cue type and reason): requiring assist for standing balance at sink                             Functional mobility during ADLs: Minimal assistance;Rolling walker;+2 for safety/equipment General ADL Comments: pt requiring consistent hands on assist given RLE instability                          Pertinent Vitals/Pain Pain Assessment: Faces Faces Pain Scale: Hurts a little bit Pain Location: generalized Pain Descriptors / Indicators: Discomfort Pain Intervention(s): Repositioned;Monitored during session     Hand Dominance     Extremity/Trunk Assessment             Communication     Cognition Arousal/Alertness: Awake/alert Behavior During Therapy: WFL for tasks assessed/performed Overall Cognitive Status: Impaired/Different from baseline Area of Impairment: Memory  Memory: Decreased recall of precautions         General Comments: min cues for carryover of precautions, pt is able to verbalize    General Comments       Exercises     Shoulder Instructions      Home Living                                          Prior Functioning/Environment                   OT Problem List: Decreased strength;Decreased range of motion;Decreased activity tolerance;Impaired balance (sitting and/or standing);Decreased  knowledge of use of DME or AE      OT Treatment/Interventions: Self-care/ADL training;Therapeutic exercise;Energy conservation;DME and/or AE instruction;Therapeutic activities;Patient/family education;Balance training    OT Goals(Current goals can be found in the care plan section) Acute Rehab OT Goals Patient Stated Goal: regain his independence for home OT Goal Formulation: With patient Time For Goal Achievement: 04/28/20 Potential to Achieve Goals: Good  OT Frequency: Min 2X/week   Barriers to D/C:            Co-evaluation              AM-PAC OT "6 Clicks" Daily Activity     Outcome Measure Help from another person eating meals?: None Help from another person taking care of personal grooming?: A Little Help from another person toileting, which includes using toliet, bedpan, or urinal?: A Lot Help from another person bathing (including washing, rinsing, drying)?: A Lot Help from another person to put on and taking off regular upper body clothing?: A Little Help from another person to put on and taking off regular lower body clothing?: A Lot 6 Click Score: 16   End of Session Equipment Utilized During Treatment: Gait belt;Rolling walker Nurse Communication: Mobility status  Activity Tolerance: Patient tolerated treatment well Patient left: in chair;with call bell/phone within reach;with nursing/sitter in room Radiographer, therapeutic present )  OT Visit Diagnosis: Muscle weakness (generalized) (M62.81);Unsteadiness on feet (R26.81)                Time: 3291-9166 OT Time Calculation (min): 25 min Charges:  OT General Charges $OT Visit: 1 Visit OT Evaluation $OT Re-eval: 1 Re-eval OT Treatments $Self Care/Home Management : 8-22 mins  Scott Rios, OT Acute Rehabilitation Services Pager [phone] Office [phone]   Scott Rios 04/14/2020, 9:48 AM

## 2020-04-14 NOTE — Progress Notes (Signed)
Patient tearful and stating he felt very alone and missing his family. Patient was able to talk to his son and ex-wife for about 5 minutes via the phone. He was happy after getting to speak to his family. Both family members were updated of patients status.

## 2020-04-14 NOTE — TOC Initial Note (Signed)
Transition of Care [facility]) - Initial/Assessment Note    Patient Details  Name: Scott Rios MRN: [id number] Date of Birth: Feb 26, 1956  Transition of Care [facility]) CM/SW Contact:    Terrial Rhodes, LCSWA Phone Number: 04/14/2020, 2:53 PM  Clinical Narrative:                  CSW spoke with patient and patients son Scott Rios at bedside. Patient is agreeable to SNF. Patient gave CSW permission to discuss care with his son Scott Rios. Patient told CSW he came from home and lives with his son Scott Rios. Patient has not had his covid vaccines. Patient does not want to receive his covid vaccines. Patient gave CSW permission to fax out initial referral to [place] area.  CSW will continue to follow.  Expected Discharge Plan: Skilled Nursing Facility Barriers to Discharge: Continued Medical Work up   Patient Goals and CMS Choice Patient states their goals for this hospitalization and ongoing recovery are:: to go to SNF CMS Medicare.gov Compare Post Acute Care list provided to:: Patient Choice offered to / list presented to : Patient  Expected Discharge Plan and Services Expected Discharge Plan: Skilled Nursing Facility   Discharge Planning Services: CM Consult Post Acute Care Choice: Durable Medical Equipment, Home Health Living arrangements for the past 2 months: Single Family Home                 DME Arranged: 3-N-1, Walker rolling DME Agency: [employer] Date DME Agency Contacted: 03/30/20 Time DME Agency Contacted: 1115 Representative spoke with at DME Agency: Oletha Cruel HH Arranged: PT, OT HH Agency: [facility] Date [facility] Agency Contacted: 03/30/20 Time HH Agency Contacted: 1110 Representative spoke with at [facility] Agency: Lorenza Chick  Prior Living Arrangements/Services Living arrangements for the past 2 months: Single Family Home Lives with:: Self, Adult Children Patient language and need for interpreter reviewed:: Yes Do you feel safe going back to the place where you  live?: No   SNF  Need for Family Participation in Patient Care: Yes (Comment) Care giver support system in place?: Yes (comment)   Criminal Activity/Legal Involvement Pertinent to Current Situation/Hospitalization: No - Comment as needed  Activities of Daily Living Home Assistive Devices/Equipment: Cane (specify quad or straight) ADL Screening (condition at time of admission) Patient's cognitive ability adequate to safely complete daily activities?: No Is the patient deaf or have difficulty hearing?: No Does the patient have difficulty seeing, even when wearing glasses/contacts?: No Does the patient have difficulty concentrating, remembering, or making decisions?: No Patient able to express need for assistance with ADLs?: Yes Does the patient have difficulty dressing or bathing?: No Independently performs ADLs?: Yes (appropriate for developmental age) Does the patient have difficulty walking or climbing stairs?: No Weakness of Legs: Both Weakness of Arms/Hands: None  Permission Sought/Granted Permission sought to share information with : Case Manager, Family Supports, Oceanographer granted to share information with : Yes, Verbal Permission Granted  Share Information with NAME: Scott Rios  Permission granted to share info w AGENCY: SNF  Permission granted to share info w Relationship: son  Permission granted to share info w Contact Information: Scott Rios [phone]  Emotional Assessment Appearance:: Appears stated age Attitude/Demeanor/Rapport: Gracious Affect (typically observed): Calm Orientation: : Oriented to Self, Oriented to Place, Oriented to  Time, Oriented to Situation Alcohol / Substance Use: Not Applicable Psych Involvement: No (comment)  Admission diagnosis:  Hyponatremia [E87.1] Acute pulmonary edema (HCC) [J81.0] Elevated troponin [R77.8] Right leg weakness [R29.898] Weakness  of right lower extremity [R29.898] S/P CABG x 6 [Z95.1] Patient  Active Problem List   Diagnosis Date Noted   Pressure injury of skin 04/12/2020   Adjustment disorder with depressed mood 04/11/2020   Malnutrition of moderate degree 04/10/2020   S/P CABG x 6 04/09/2020   Non-ST elevation (NSTEMI) myocardial infarction [facility])    Right leg weakness 03/28/2020   Flash pulmonary edema (HCC) 03/28/2020   Hypomagnesemia 03/28/2020   Hypoalbuminemia 03/28/2020   Hyperglycemia 03/28/2020   Hyponatremia 03/28/2020   Elevated troponin 03/28/2020   Pulmonary nodule 03/28/2020   Acute respiratory failure with hypoxia (HCC) 03/28/2020   Tobacco abuse 03/28/2020   Acute pulmonary edema (HCC)    Weakness of right lower extremity    Acute systolic heart failure (HCC)    Mitral regurgitation    Nonrheumatic aortic valve insufficiency    PCP:  Elfredia Nevins, MD Pharmacy:   [facility] - [place], [place] - [address] [place] [address] Phone: [phone] Fax: [phone]     Social Determinants of Health (SDOH) Interventions    Readmission Risk Interventions No flowsheet data found.

## 2020-04-14 NOTE — [place] FL2 (Signed)
[place] MEDICAID FL2 LEVEL OF CARE SCREENING TOOL     IDENTIFICATION  Patient Name: Scott Rios Birthdate: 12-12-1955 Sex: male Admission Date (Current Location): 03/27/2020  [facility] and [place] Number:  Producer, television/film/video and Address:  [facility], [address] [place], [place] [address]      Provider Number: [id number]  Attending Physician Name and Address:  Alleen Borne, MD  Relative Name and Phone Number:  [name]  [phone]    Current Level of Care: Hospital Recommended Level of Care: Skilled Nursing Facility Prior Approval Number:    Date Approved/Denied:   PASRR Number: PASRR under review  Discharge Plan: SNF    Current Diagnoses: Patient Active Problem List   Diagnosis Date Noted  . Pressure injury of skin 04/12/2020  . Adjustment disorder with depressed mood 04/11/2020  . Malnutrition of moderate degree 04/10/2020  . S/P CABG x 6 04/09/2020  . Non-ST elevation (NSTEMI) myocardial infarction (HCC)   . Right leg weakness 03/28/2020  . Flash pulmonary edema (HCC) 03/28/2020  . Hypomagnesemia 03/28/2020  . Hypoalbuminemia 03/28/2020  . Hyperglycemia 03/28/2020  . Hyponatremia 03/28/2020  . Elevated troponin 03/28/2020  . Pulmonary nodule 03/28/2020  . Acute respiratory failure with hypoxia (HCC) 03/28/2020  . Tobacco abuse 03/28/2020  . Acute pulmonary edema (HCC)   . Weakness of right lower extremity   . Acute systolic heart failure (HCC)   . Mitral regurgitation   . Nonrheumatic aortic valve insufficiency     Orientation RESPIRATION BLADDER Height & Weight     Self, Time, Situation, Place  Normal Continent Weight: 157 lb 3 oz (71.3 kg) Height:  5\' 7"  (170.2 cm)  BEHAVIORAL SYMPTOMS/MOOD NEUROLOGICAL BOWEL NUTRITION STATUS      Continent Diet (See Discharge Summary)  AMBULATORY STATUS COMMUNICATION OF NEEDS Skin   Limited Assist Verbally Surgical wounds, Other (Comment) (Approp.for eth, dry, Pressure Injury  Buttocks Right Stage 2 Foam Lift dressing clean;dry;intact every 3 days,Incision Closed Leg Right,Incision Closed Chest other attached edges approximated)                       Personal Care Assistance Level of Assistance  Bathing, Feeding, Dressing Bathing Assistance: Limited assistance Feeding assistance: Independent (able to feed self) Dressing Assistance: Limited assistance     Functional Limitations Info  Sight, Hearing, Speech Sight Info: Adequate Hearing Info: Adequate Speech Info: Adequate    SPECIAL CARE FACTORS FREQUENCY  PT (By licensed PT), OT (By licensed OT)     PT Frequency: 5x min weekly OT Frequency: 5x min weekly            Contractures Contractures Info: Not present    Additional Factors Info  Code Status, Allergies, Psychotropic, Insulin Sliding Scale Code Status Info: FULL Code Allergies Info: Penicillins Psychotropic Info: traZODone (DESYREL) tablet 50 mg daily at bedtime Insulin Sliding Scale Info: insulin aspart (novoLOG) injection 0-24 Units 3 times daily with meals,insulin detemir (LEVEMIR) injection 10 Units daily       Current Medications (04/14/2020):  This is the current hospital active medication list Current Facility-Administered Medications  Medication Dose Route Frequency Provider Last Rate Last Admin  . (feeding supplement) PROSource Plus liquid 30 mL  30 mL Oral BID BM 04/16/2020, MD   30 mL at 04/14/20 0926  . 0.45 % sodium chloride infusion   Intravenous Continuous PRN Roddenberry, Myron G, PA-C      . 0.9 %  sodium chloride  infusion (Manually program via Guardrails IV Fluids)   Intravenous Once Lightfoot, Harrell O, MD      . 0.9 %  sodium chloride infusion  250 mL Intravenous Continuous Roddenberry, Myron G, PA-C      . 0.9 %  sodium chloride infusion   Intravenous Continuous Leary Roca, PA-C 20 mL/hr at 04/09/20 1743 New Bag/Given (Non-Interop) at 04/09/20 1743  . acetaminophen (TYLENOL) tablet 1,000 mg  1,000  mg Oral Q6H Roddenberry, Myron G, PA-C   1,000 mg at 04/14/20 1210   Or  . acetaminophen (TYLENOL) 160 MG/5ML solution 1,000 mg  1,000 mg Per Tube Q6H Roddenberry, Myron G, PA-C      . aspirin EC tablet 325 mg  325 mg Oral Daily Leary Roca, PA-C   325 mg at 04/14/20 2426   Or  . aspirin chewable tablet 324 mg  324 mg Per Tube Daily Roddenberry, Myron G, PA-C      . atorvastatin (LIPITOR) tablet 80 mg  80 mg Oral Daily Leary Roca, PA-C   80 mg at 04/14/20 0925  . chlorhexidine (PERIDEX) 0.12 % solution 15 mL  15 mL Mouth Rinse BID Alleen Borne, MD   15 mL at 04/14/20 0933  . Chlorhexidine Gluconate Cloth 2 % PADS 6 each  6 each Topical Daily Alleen Borne, MD   6 each at 04/13/20 1320  . enoxaparin (LOVENOX) injection 30 mg  30 mg Subcutaneous Q24H Delight Ovens, MD   30 mg at 04/13/20 2118  . feeding supplement (GLUCERNA SHAKE) (GLUCERNA SHAKE) liquid 237 mL  237 mL Oral BID BM Alleen Borne, MD   237 mL at 04/13/20 1321  . ferrous fumarate-b12-vitamic C-folic acid (TRINSICON / FOLTRIN) capsule 1 capsule  1 capsule Oral BID PC Alleen Borne, MD   1 capsule at 04/13/20 1729  . insulin aspart (novoLOG) injection 0-24 Units  0-24 Units Subcutaneous TID WC Alleen Borne, MD   4 Units at 04/14/20 1214  . insulin detemir (LEVEMIR) injection 10 Units  10 Units Subcutaneous Daily Alleen Borne, MD   10 Units at 04/14/20 [phone]  . metolazone (ZAROXOLYN) tablet 5 mg  5 mg Oral Q24H Alleen Borne, MD   5 mg at 04/14/20 0925  . morphine 2 MG/ML injection 1-4 mg  1-4 mg Intravenous Q1H PRN Leary Roca, PA-C   2 mg at 04/13/20 2117  . ondansetron (ZOFRAN) injection 4 mg  4 mg Intravenous Q6H PRN Roddenberry, Myron G, PA-C      . pantoprazole (PROTONIX) EC tablet 40 mg  40 mg Oral Daily Leary Roca, PA-C   40 mg at 04/14/20 9622  . potassium chloride SA (KLOR-CON) CR tablet 20 mEq  20 mEq Oral TID Alleen Borne, MD      . senna-docusate (Senokot-S) tablet  1 tablet  1 tablet Oral BID PRN Alleen Borne, MD      . sodium chloride flush (NS) 0.9 % injection 3 mL  3 mL Intravenous Q12H Roddenberry, Myron G, PA-C   3 mL at 04/14/20 0926  . sodium chloride flush (NS) 0.9 % injection 3 mL  3 mL Intravenous PRN Roddenberry, Myron G, PA-C      . tamsulosin (FLOMAX) capsule 0.4 mg  0.4 mg Oral Daily Alleen Borne, MD   0.4 mg at 04/14/20 0925  . torsemide (DEMADEX) tablet 20 mg  20 mg Oral BID Alleen Borne, MD      .  traMADol (ULTRAM) tablet 50 mg  50 mg Oral Q4H PRN Alleen Borne, MD   50 mg at 04/14/20 1025  . traZODone (DESYREL) tablet 50 mg  50 mg Oral QHS Akintayo, Mojeed, MD   50 mg at 04/13/20 2117  . warfarin (COUMADIN) tablet 2.5 mg  2.5 mg Oral q1600 Alleen Borne, MD   2.5 mg at 04/13/20 1531  . Warfarin - Physician Dosing Inpatient   Does not apply [id number] Alleen Borne, MD   Given at 04/13/20 1541     Discharge Medications: Please see discharge summary for a list of discharge medications.  Relevant Imaging Results:  Relevant Lab Results:   Additional Information [id number]  Terrial Rhodes, LCSWA

## 2020-04-14 NOTE — Progress Notes (Signed)
CARDIAC REHAB PHASE I   PRE:  Rate/Rhythm: 70 afib    BP: sitting 99/54    SaO2: 95 RA  MODE:  Ambulation: 230 ft total, x3 sitting rests   POST:  Rate/Rhythm: 82 afib    BP: sitting 157/73     SaO2: 92 RA  Pt motivated to ambulate. Mod assist to sit up and move hips to EOB. Mod assist to stand with gait belt. Pt ambulated with RW, gait belt mod assist, and chair follow. Bilateral leg weakness with tendency to place feet too close together. Right leg weaker. Pt very attentive and focused on appropriate mechanics. Sat x3 due to fatigue, esp in legs. Pt needed rocking and mod assist to stand with tendency for posterior lean. To recliner after walk, VSS. [phone]  Harriet Masson CES, ACSM 04/14/2020 1:52 PM

## 2020-04-14 NOTE — Progress Notes (Signed)
T[facility] BRIEF SICU PROGRESS NOTE  5 Days Post-Op  S/P Procedure(s) (LRB): CORONARY ARTERY BYPASS GRAFTING (CABG) X 6 USING LIMA TO LAD; ENDOSCOPICALLY HARVESTED RIGHT GREATER SAPHENOUS VEIN: SVG to D1; SVG seq to OM2 and OM3; SVG seq to PD and PL. AORTIC VALVE REPLACEMENT USING INSPIRIS RESILIA 23 MM AORTIC VALVE. 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 LEFT ATRIAL APPENDAGE CLIPPING USING ATRICURE PRO240 40 MM CLIP. (N/A) AORTIC VALVE REPLACEMENT (AVR) USING EDWARDS INSPIRIS RESILIA 23 MM AORTIC VALVE. (N/A) MITRAL VALVE REPLACEMENT (MVR) USING MEMO 4D 28 MM MITRAL VALVE (N/A) TRANSESOPHAGEAL ECHOCARDIOGRAM (TEE) (N/A) ENDOVEIN HARVEST OF GREATER SAPHENOUS VEIN (Right) CLIPPING OF ATRIAL APPENDAGE USING  ATRICURE [id number] ATRICLIP (Left)   Stable day  Plan: Continue current plan  Purcell Nails, MD 04/14/2020 7:53 PM

## 2020-04-14 NOTE — Progress Notes (Cosign Needed Addendum)
RE: Scott Rios. [name]  Date of Birth: 05-Oct-1955  Date: 04/14/2020  To Whom It May Concern:  Please be advised that the above-named patient will require a short-term nursing home stay - anticipated 30 days or less for rehabilitation and strengthening. The plan is for return home.

## 2020-04-15 LAB — GLUCOSE, CAPILLARY
Glucose-Capillary: 186 mg/dL — ABNORMAL HIGH (ref 70–99)
Glucose-Capillary: 222 mg/dL — ABNORMAL HIGH (ref 70–99)
Glucose-Capillary: 148 mg/dL — ABNORMAL HIGH (ref 70–99)
Glucose-Capillary: 104 mg/dL — ABNORMAL HIGH (ref 70–99)

## 2020-04-15 LAB — CBC
HCT: 25.8 % — ABNORMAL LOW (ref 39.0–52.0)
Hemoglobin: 8.7 g/dL — ABNORMAL LOW (ref 13.0–17.0)
MCH: 29.7 pg (ref 26.0–34.0)
MCHC: 33.7 g/dL (ref 30.0–36.0)
MCV: 88.1 fL (ref 80.0–100.0)
Platelets: 277 10*3/uL (ref 150–400)
RBC: 2.93 MIL/uL — ABNORMAL LOW (ref 4.22–5.81)
RDW: 14.7 % (ref 11.5–15.5)
WBC: 13.4 10*3/uL — ABNORMAL HIGH (ref 4.0–10.5)
nRBC: 0 % (ref 0.0–0.2)

## 2020-04-15 LAB — PROTIME-INR
INR: 1.2 (ref 0.8–1.2)
Prothrombin Time: 14.9 seconds (ref 11.4–15.2)

## 2020-04-15 LAB — BASIC METABOLIC PANEL
Anion gap: 12 (ref 5–15)
BUN: 20 mg/dL (ref 8–23)
CO2: 27 mmol/L (ref 22–32)
Calcium: 8.7 mg/dL — ABNORMAL LOW (ref 8.9–10.3)
Chloride: 89 mmol/L — ABNORMAL LOW (ref 98–111)
Creatinine, Ser: 0.7 mg/dL (ref 0.61–1.24)
GFR calc Af Amer: >60 mL/min (ref >60)
GFR calc non Af Amer: >60 mL/min (ref >60)
Glucose, Bld: 227 mg/dL — ABNORMAL HIGH (ref 70–99)
Potassium: 3.8 mmol/L (ref 3.5–5.1)
Sodium: 128 mmol/L — ABNORMAL LOW (ref 135–145)

## 2020-04-15 MED ORDER — TRAMADOL HCL 50 MG PO TABS
50.0000 mg | ORAL_TABLET | Freq: Two times a day (BID) | ORAL | Status: DC | PRN
  Administered 2020-04-15: 50 mg via ORAL
  Filled 2020-04-15: qty 1

## 2020-04-15 MED ORDER — WARFARIN SODIUM 5 MG PO TABS
5.0000 mg | ORAL_TABLET | Freq: Every day | ORAL | Status: DC
  Administered 2020-04-15 – 2020-04-16 (×2): 5 mg via ORAL
  Filled 2020-04-15 (×2): qty 1

## 2020-04-15 MED ORDER — DIPHENHYDRAMINE HCL 50 MG/ML IJ SOLN
50.0000 mg | Freq: Four times a day (QID) | INTRAMUSCULAR | Status: AC | PRN
  Administered 2020-04-15: 50 mg via INTRAVENOUS
  Filled 2020-04-15: qty 1

## 2020-04-15 MED ORDER — DIPHENHYDRAMINE HCL 25 MG PO CAPS: 50.0000 mg | ORAL_CAPSULE | Freq: Four times a day (QID) | ORAL | Status: AC | PRN

## 2020-04-15 NOTE — Progress Notes (Addendum)
TCTS DAILY ICU PROGRESS NOTE                   [address]            [place] [address]          [phone]   6 Days Post-Op Procedure(s) (LRB): CORONARY ARTERY BYPASS GRAFTING (CABG) X 6 USING LIMA TO LAD; ENDOSCOPICALLY HARVESTED RIGHT GREATER SAPHENOUS VEIN: SVG to D1; SVG seq to OM2 and OM3; SVG seq to PD and PL. AORTIC VALVE REPLACEMENT USING INSPIRIS RESILIA 23 MM AORTIC VALVE. 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 LEFT ATRIAL APPENDAGE CLIPPING USING ATRICURE PRO240 40 MM CLIP. (N/A) AORTIC VALVE REPLACEMENT (AVR) USING EDWARDS INSPIRIS RESILIA 23 MM AORTIC VALVE. (N/A) MITRAL VALVE REPLACEMENT (MVR) USING MEMO 4D 28 MM MITRAL VALVE (N/A) TRANSESOPHAGEAL ECHOCARDIOGRAM (TEE) (N/A) ENDOVEIN HARVEST OF GREATER SAPHENOUS VEIN (Right) CLIPPING OF ATRIAL APPENDAGE USING  ATRICURE [id number] ATRICLIP (Left)  Total Length of Stay:  LOS: 18 days   Subjective: Transferred to 4E Progressive Care last evening. Awake and alert, up in the bedside chair with a sitter at the bedside.  Says he rested well. No new concerns.  On O2 at 1.5L/min.    Objective: Vital signs in last 24 hours: Temp:  [97.7 F (36.5 C)-98.6 F (37 C)] 97.7 F (36.5 C) (09/01 0400) Pulse Rate:  [64-149] 87 (09/01 0400) Cardiac Rhythm: Atrial flutter (08/31 2000) Resp:  [15-22] 16 (09/01 0400) BP: (99-157)/(54-87) 123/70 (09/01 0400) SpO2:  [88 %-97 %] 97 % (09/01 0400) Weight:  [70.5 kg] 70.5 kg (09/01 0100)  Filed Weights   04/13/20 0200 04/14/20 0435 04/15/20 0100  Weight: 70.6 kg 71.3 kg 70.5 kg    Weight change: -0.8 kg     Intake/Output from previous day: 08/31 0701 - 09/01 0700 In: 840 [P.O.:840] Out: 3000 [Urine:3000]  Intake/Output this shift: No intake/output data recorded.  Current Meds: Scheduled Meds: . (feeding supplement) PROSource Plus  30 mL Oral BID BM  . atorvastatin  80 mg Oral Daily  . chlorhexidine  15 mL Mouth Rinse BID  . [START ON 04/16/2020]  Chlorhexidine Gluconate Cloth  6 each Topical Daily  . enoxaparin (LOVENOX) injection  30 mg Subcutaneous Q24H  . feeding supplement (GLUCERNA SHAKE)  237 mL Oral BID BM  . ferrous fumarate-b12-vitamic C-folic acid  1 capsule Oral BID PC  . insulin aspart  0-24 Units Subcutaneous TID WC  . insulin detemir  10 Units Subcutaneous Daily  . metolazone  5 mg Oral Q24H  . potassium chloride  20 mEq Oral TID  . sodium chloride flush  3 mL Intravenous Q12H  . tamsulosin  0.4 mg Oral Daily  . torsemide  20 mg Oral BID  . traZODone  50 mg Oral QHS  . warfarin  5 mg Oral q1600  . Warfarin - Physician Dosing Inpatient   Does not apply q1600   Continuous Infusions: . sodium chloride     PRN Meds:.sodium chloride, senna-docusate, sodium chloride flush  General appearance: alert, cooperative and no distress Neurologic: No new deficits, has chronic LE weakness due to spinal stenosis.  Heart: Atrial fibrillation improved rate in 70's -80's.  Lungs: Breath sounds are clear, sats acceptable on [place] O2. Abdomen: soft, NT.  Extremities: mild LE edema Wound: The sternal incision and RLE EVH incisions are dry. .  Lab Results: CBC: Recent Labs    04/13/20 0127 04/15/20 0212  WBC 16.1* 13.4*  HGB 8.7*  8.7*  HCT 25.6* 25.8*  PLT 191 277   BMET:  Recent Labs    04/14/20 0238 04/15/20 0212  NA 129* 128*  K 4.3 3.8  CL 92* 89*  CO2 28 27  GLUCOSE 92 227*  BUN 21 20  CREATININE 0.69 0.70  CALCIUM 8.6* 8.7*    CMET: Lab Results  Component Value Date   WBC 13.4 (H) 04/15/2020   HGB 8.7 (L) 04/15/2020   HCT 25.8 (L) 04/15/2020   PLT 277 04/15/2020   GLUCOSE 227 (H) 04/15/2020   ALT 19 04/09/2020   AST 17 04/09/2020   NA 128 (L) 04/15/2020   K 3.8 04/15/2020   CL 89 (L) 04/15/2020   CREATININE 0.70 04/15/2020   BUN 20 04/15/2020   CO2 27 04/15/2020   TSH 2.465 03/29/2020   INR 1.2 04/15/2020   HGBA1C 10.0 (H) 04/09/2020      PT/INR:  Recent Labs    04/15/20 0212  LABPROT  14.9  INR 1.2   Radiology: No results found.   Assessment/Plan: S/P Procedure(s) (LRB): CORONARY ARTERY BYPASS GRAFTING (CABG) X 6 USING LIMA TO LAD; ENDOSCOPICALLY HARVESTED RIGHT GREATER SAPHENOUS VEIN: SVG to D1; SVG seq to OM2 and OM3; SVG seq to PD and PL. AORTIC VALVE REPLACEMENT USING INSPIRIS RESILIA 23 MM AORTIC VALVE. 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 LEFT ATRIAL APPENDAGE CLIPPING USING ATRICURE PRO240 40 MM CLIP. (N/A) AORTIC VALVE REPLACEMENT (AVR) USING EDWARDS INSPIRIS RESILIA 23 MM AORTIC VALVE. (N/A) MITRAL VALVE REPLACEMENT (MVR) USING MEMO 4D 28 MM MITRAL VALVE (N/A) TRANSESOPHAGEAL ECHOCARDIOGRAM (TEE) (N/A) ENDOVEIN HARVEST OF GREATER SAPHENOUS VEIN (Right) CLIPPING OF ATRIAL APPENDAGE USING  ATRICURE [id number] ATRICLIP (Left)  --POD-6 CABG x 6, MV replacement with a 47mm bioprosthetic valve, MV annuloplasty, and clipping of left atrial appendage. BP stable. Overall progressing with mobility.   -Atrial fibrillation- has been having slow VR but this has improved.  On no beta blocker or antiarrhythmics. Will remove the pacer wires today. Anticoagulation initiated with Coumadin, INR remains 1.2. Will increase coumadin to 5mg  po daily.   -Hyponatremia- Stable, Continue to monitor.   -Volume excess- net 2L diuresis yesterday with addition of torsemide. Wt.still several pounds above pre-op. Continue diuresis.   -Expected acute blood loss anemia-  Hct trending up. On Fe++ supplement. Monitor.   -Chronic left leg weakness from spinal stenosis- Continue PT / OT.  -History of major depression- Seems to be in better spirits. Continue trazodone as he was on pre-op.   -Disposition-  OT recommends discharge to SNF for further rehab. Care management team working SNF selection.    , PA-C [phone] 04/15/2020 7:54 AM    Chart reviewed, patient examined, agree with above. In good spirits this morning and says he feels well. Diuresed -2L  yesterday with torsemide and metolazone. Will continue today. Replace K+.

## 2020-04-15 NOTE — Progress Notes (Signed)
Paged by nurse earlier for back pain, given a single dose of 50mg  tramadol. Afterward, patient had itchiness, suspect allergic reaction, therefore given single dose of benadryl.   Paged again by nurse for visual hallucination as patient seeing Ants crawling when there are none. Talking with the patient, he denies any chest pain or SOB. Day 6 post op. Chronic RLE weakness, otherwise no other focal neurological deficit. I suspect he is having allergic reaction with tramadol, recommend observation and wait for medication to wash out. He mentioned he has had benadryl in the past without any issue. Previous allergy only listed Penicillin. I have added tramadol to his allergy list.   Will inform MD.

## 2020-04-15 NOTE — Progress Notes (Addendum)
CARDIAC REHAB PHASE I   PRE:  Rate/Rhythm: 68 afib    BP: sitting 121/59    SaO2: 98 1 1/2L, 93 RA  MODE:  Ambulation: 360 ft, sat to rest x6  POST:  Rate/Rhythm: 83 afib    BP: sitting 123/80     SaO2: 95 RA  Pt very motivated to ambulate. Stood with rocking and mod assist with gait belt to completely ascend. Slow pace with narrow stance, verbal cues to widen but easily forgets. Pt with x6 sitting rests due to leg weakness. Pt became emotional after 280 ft, frustrated with weakness and feeling "failure". After encouragement and music, pt able to finish his goal of returning to room (without being rolled back). To recliner after walk, VSS. Left with sitter. Encouraged IS, which pt sts he is doing 10 every hour. Will walk more later today (PT, MS). Used assist x2 today (one assist with pt, one to follow with rollator). Could utilize Recruitment consultant with chair follow [phone]  Harriet Masson CES, ACSM 04/15/2020 9:09 AM

## 2020-04-15 NOTE — Progress Notes (Signed)
Mobility Specialist: Progress Note   04/15/20 1625  Mobility  Activity  (Cancel)   Instructed by RN to not work w/ pt today due to pain level and not being able to have pain medication for a while.  [facility] Khamila Bassinger Mobility Specialist

## 2020-04-15 NOTE — Progress Notes (Signed)
EPW removed per protocol. Tips intact. VSS. Patient educated about one hour bedrest. Will continue to monitor closely.  

## 2020-04-15 NOTE — Progress Notes (Signed)
Patient transferred to 4E03 via monitored wheelchair with safety attendant at bedside. Pacemaker remains intact and attached, however remains off. Settings confirmed with Marylene Land and the charge RN on 4E. Patient connected to monitor and sitting upright in bed. No patient belongings remain at bedside (patient reports son took all personal belongings home).  Patient transferred: vital signs stable, no adverse events.

## 2020-04-15 NOTE — Progress Notes (Signed)
Progress Note  Patient Name: Scott Rios Date of Encounter: 04/15/2020  Primary Cardiologist:   No primary care provider on file.   Subjective   He is complaining of some knee and some back pain but no distress.  Breathing OK  Inpatient Medications    Scheduled Meds: . (feeding supplement) PROSource Plus  30 mL Oral BID BM  . atorvastatin  80 mg Oral Daily  . chlorhexidine  15 mL Mouth Rinse BID  . [START ON 04/16/2020] Chlorhexidine Gluconate Cloth  6 each Topical Daily  . enoxaparin (LOVENOX) injection  30 mg Subcutaneous Q24H  . feeding supplement (GLUCERNA SHAKE)  237 mL Oral BID BM  . ferrous fumarate-b12-vitamic C-folic acid  1 capsule Oral BID PC  . insulin aspart  0-24 Units Subcutaneous TID WC  . insulin detemir  10 Units Subcutaneous Daily  . metolazone  5 mg Oral Q24H  . potassium chloride  20 mEq Oral TID  . sodium chloride flush  3 mL Intravenous Q12H  . tamsulosin  0.4 mg Oral Daily  . torsemide  20 mg Oral BID  . traZODone  50 mg Oral QHS  . warfarin  5 mg Oral q1600  . Warfarin - Physician Dosing Inpatient   Does not apply q1600   Continuous Infusions: . sodium chloride     PRN Meds: sodium chloride, senna-docusate, sodium chloride flush   Vital Signs    Vitals:   04/15/20 0000 04/15/20 0100 04/15/20 0400 04/15/20 0804  BP: (!) 150/83 125/62 123/70 (!) 121/59  Pulse: 93 88 87 (!) 59  Resp: 15 20 16 20   Temp: 97.9 F (36.6 C) 98.4 F (36.9 C) 97.7 F (36.5 C) 97.9 F (36.6 C)  TempSrc:  Oral Oral Oral  SpO2: 97% 94% 97% 98%  Weight:  70.5 kg    Height:        Intake/Output Summary (Last 24 hours) at 04/15/2020 0839 Last data filed at 04/15/2020 0425 Gross per 24 hour  Intake 480 ml  Output 3000 ml  Net -2520 ml   Filed Weights   04/13/20 0200 04/14/20 0435 04/15/20 0100  Weight: 70.6 kg 71.3 kg 70.5 kg    Telemetry    Atrial fib with slow ventricular rate and intermittent SB.   - Personally Reviewed  ECG    NA - Personally  Reviewed  Physical Exam   GEN: No  acute distress.   Neck: No  JVD Cardiac: RRR, no murmurs, positive rub, or gallops.  Respiratory:   Few decreased breath sounds.   GI: Soft, nontender, non-distended, normal bowel sounds  MS:  Moderate leg edema; No deformity. Neuro:   Nonfocal  Psych: Oriented and appropriate    Labs    Chemistry Recent Labs  Lab 04/09/20 0039 04/09/20 0803 04/13/20 0127 04/14/20 0238 04/15/20 0212  NA 124*   < > 128* 129* 128*  K 4.7   < > 3.6 4.3 3.8  CL 93*   < > 92* 92* 89*  CO2 24   < > 25 28 27   GLUCOSE 180*   < > 144* 92 227*  BUN 14   < > 20 21 20   CREATININE 0.84   < > 0.63 0.69 0.70  CALCIUM 8.8*   < > 8.4* 8.6* 8.7*  PROT 5.8*  --   --   --   --   ALBUMIN 3.2*  --   --   --   --   AST 17  --   --   --   --  ALT 19  --   --   --   --   ALKPHOS 69  --   --   --   --   BILITOT 0.5  --   --   --   --   GFRNONAA >60   < > >60 >60 >60  GFRAA >60   < > >60 >60 >60  ANIONGAP 7   < > 11 9 12    < > = values in this interval not displayed.     Hematology Recent Labs  Lab 04/12/20 0053 04/13/20 0127 04/15/20 0212  WBC 17.5* 16.1* 13.4*  RBC 2.46* 2.87* 2.93*  HGB 7.6* 8.7* 8.7*  HCT 22.6* 25.6* 25.8*  MCV 91.9 89.2 88.1  MCH 30.9 30.3 29.7  MCHC 33.6 34.0 33.7  RDW 14.8 15.5 14.7  PLT 156 191 277    Cardiac EnzymesNo results for input(s): TROPONINI in the last 168 hours. No results for input(s): TROPIPOC in the last 168 hours.   BNPNo results for input(s): BNP, PROBNP in the last 168 hours.   DDimer No results for input(s): DDIMER in the last 168 hours.   Radiology    No results found.  Cardiac Studies   Diagnostic Dominance: Right   TEE: Severe aortic insufficiency, moderate to severe mitral regurgitation, EF 35%   Patient Profile     64 y.o. male with a hx of DM and HTN, ETOH abuse and tobacco abusewho is being seen in consultation for the evaluation of acute pulmonary edema, LV dysfunction, moderate MR and  severe ARat the request of 75, MD.  Assessment & Plan    ATRIAL FIB:   Slow ventricular rate.  On warfarin.  Avoiding amiodarone and negative chronotropes.    Consider adding ARB.  I will defer to primary team.   ISCHEMIC CARDIOMYOPATHY:    Net negative 14 liters.  Remains hyponatremic.  BP is up.  As above.     CAD:  CABG day 7.     For questions or updates, please contact [facility] Please consult [email or web address] for contact info under Cardiology/STEMI.   Signed, Marguerita Merles, MD  04/15/2020, 8:39 AM

## 2020-04-15 NOTE — Progress Notes (Signed)
Physical Therapy Treatment Patient Details Name: Scott Rios MRN: [id number] DOB: 1955-10-15 Today's Date: 04/15/2020    History of Present Illness 64 yo admitted 8/13 with acute pulmonary edema, Severe AR and moderate MR. 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 PMHx: Dm, HTN, ETOH and tobacco abuse    PT Comments    Pt disheartened and tearful today, stating "I am disappointed in myself" due to impaired mobility. PT provided encouragement throughout session. Pt ambulated hallway distance with min assist and close chair follow for safety, pt requiring seated rest break to recover LE fatigue. VSS throughout session. Will continue to follow acutely.     Follow Up Recommendations  SNF;Supervision/Assistance - 24 hour;Home health PT     Equipment Recommendations  Rolling walker with 5" wheels    Recommendations for Other Services       Precautions / Restrictions Precautions Precautions: Sternal;Fall;Other (comment) Precaution Comments: suicide, external pacer pulled 9/1 at 0900 Restrictions Weight Bearing Restrictions: No    Mobility  Bed Mobility Overal bed mobility: Needs Assistance             General bed mobility comments: sitting EOB upon PT arrival to room  Transfers Overall transfer level: Needs assistance Equipment used: Rolling walker (2 wheeled) Transfers: Sit to/from Stand Sit to Stand: Min assist         General transfer comment: Min assist for power up, verbal cuing for safe hand placement when rising/sitting. sit<>stand x2, from EOB and from recliner.  Ambulation/Gait Ambulation/Gait assistance: Min assist;+2 safety/equipment Gait Distance (Feet): 30 Feet (+25) Assistive device: Rolling walker (2 wheeled) Gait Pattern/deviations: Step-through pattern;Decreased stride length;Trunk flexed Gait velocity: decr   General Gait Details: Min assist to steady, verbal cuing for upright posture, placement in RW, seated rest breaks as needed. Chair follow  for pt safety.   Stairs             Wheelchair Mobility    Modified Rankin (Stroke Patients Only)       Balance Overall balance assessment: Needs assistance Sitting-balance support: No upper extremity supported;Feet supported Sitting balance-Leahy Scale: Good     Standing balance support: Single extremity supported;Bilateral upper extremity supported;During functional activity Standing balance-Leahy Scale: Poor                              Cognition Arousal/Alertness: Awake/alert Behavior During Therapy: WFL for tasks assessed/performed Overall Cognitive Status: Impaired/Different from baseline Area of Impairment: Memory                     Memory: Decreased recall of precautions         General Comments: requires verbal cuing to follow sternal precautions during mobility, pt tearful this session stating "I'm disappointing myself" about mobility.      Exercises General Exercises - Lower Extremity Long Arc Quad: AROM;Both;10 reps;Seated    General Comments General comments (skin integrity, edema, etc.): HR 60s bpm on average during session, SpO2 90% and greater on RA      Pertinent Vitals/Pain Pain Assessment: Faces Faces Pain Scale: Hurts little more Pain Location: back, from laying flat after pacer pulled Pain Descriptors / Indicators: Discomfort Pain Intervention(s): Limited activity within patient's tolerance;Monitored during session;Repositioned    Home Living                      Prior Function  PT Goals (current goals can now be found in the care plan section) Acute Rehab PT Goals Patient Stated Goal: regain his independence for home PT Goal Formulation: With patient Time For Goal Achievement: 04/25/20 Potential to Achieve Goals: Fair Progress towards PT goals: Progressing toward goals    Frequency    Min 3X/week      PT Plan Current plan remains appropriate    Co-evaluation               AM-PAC PT "6 Clicks" Mobility   Outcome Measure  Help needed turning from your back to your side while in a flat bed without using bedrails?: A Little Help needed moving from lying on your back to sitting on the side of a flat bed without using bedrails?: A Little Help needed moving to and from a bed to a chair (including a wheelchair)?: A Little Help needed standing up from a chair using your arms (e.g., wheelchair or bedside chair)?: A Little Help needed to walk in hospital room?: A Lot Help needed climbing 3-5 steps with a railing? : A Lot 6 Click Score: 16    End of Session Equipment Utilized During Treatment: Gait belt Activity Tolerance: Patient tolerated treatment well Patient left: in chair;with nursing/sitter in room Nurse Communication: Mobility status PT Visit Diagnosis: Unsteadiness on feet (R26.81);Muscle weakness (generalized) (M62.81);Difficulty in walking, not elsewhere classified (R26.2);Other abnormalities of gait and mobility (R26.89)     Time: 1884-1660 PT Time Calculation (min) (ACUTE ONLY): 23 min  Charges:  $Gait Training: 8-22 mins $Therapeutic Activity: 8-22 mins                     Wilburt Messina E, PT Acute Rehabilitation Services Pager [phone]  Office [phone]    Andrei Mccook D Despina Hidden 04/15/2020, 3:49 PM

## 2020-04-16 LAB — PROTIME-INR
INR: 1.4 — ABNORMAL HIGH (ref 0.8–1.2)
Prothrombin Time: 16.7 seconds — ABNORMAL HIGH (ref 11.4–15.2)

## 2020-04-16 LAB — BASIC METABOLIC PANEL
Anion gap: 13 (ref 5–15)
BUN: 25 mg/dL — ABNORMAL HIGH (ref 8–23)
CO2: 31 mmol/L (ref 22–32)
Calcium: 9 mg/dL (ref 8.9–10.3)
Chloride: 86 mmol/L — ABNORMAL LOW (ref 98–111)
Creatinine, Ser: 0.76 mg/dL (ref 0.61–1.24)
GFR calc Af Amer: >60 mL/min (ref >60)
GFR calc non Af Amer: >60 mL/min (ref >60)
Glucose, Bld: 105 mg/dL — ABNORMAL HIGH (ref 70–99)
Potassium: 3.3 mmol/L — ABNORMAL LOW (ref 3.5–5.1)
Sodium: 130 mmol/L — ABNORMAL LOW (ref 135–145)

## 2020-04-16 LAB — GLUCOSE, CAPILLARY
Glucose-Capillary: 110 mg/dL — ABNORMAL HIGH (ref 70–99)
Glucose-Capillary: 185 mg/dL — ABNORMAL HIGH (ref 70–99)
Glucose-Capillary: 233 mg/dL — ABNORMAL HIGH (ref 70–99)
Glucose-Capillary: 163 mg/dL — ABNORMAL HIGH (ref 70–99)

## 2020-04-16 MED ORDER — POTASSIUM CHLORIDE CRYS ER 20 MEQ PO TBCR
30.0000 meq | EXTENDED_RELEASE_TABLET | Freq: Once | ORAL | Status: AC
  Administered 2020-04-16: 30 meq via ORAL
  Filled 2020-04-16: qty 1

## 2020-04-16 MED ORDER — ACETAMINOPHEN 325 MG PO TABS
650.0000 mg | ORAL_TABLET | Freq: Four times a day (QID) | ORAL | Status: DC | PRN
  Administered 2020-04-16 – 2020-04-18 (×6): 650 mg via ORAL
  Filled 2020-04-16 (×6): qty 2

## 2020-04-16 NOTE — Progress Notes (Addendum)
[facility] DAILY ICU PROGRESS NOTE                   [address]            [place] [address]          [phone]   7 Days Post-Op Procedure(s) (LRB): CORONARY ARTERY BYPASS GRAFTING (CABG) X 6 USING LIMA TO LAD; ENDOSCOPICALLY HARVESTED RIGHT GREATER SAPHENOUS VEIN: SVG to D1; SVG seq to OM2 and OM3; SVG seq to PD and PL. AORTIC VALVE REPLACEMENT USING INSPIRIS RESILIA 23 MM AORTIC VALVE. 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 LEFT ATRIAL APPENDAGE CLIPPING USING ATRICURE PRO240 40 MM CLIP. (N/A) AORTIC VALVE REPLACEMENT (AVR) USING EDWARDS INSPIRIS RESILIA 23 MM AORTIC VALVE. (N/A) MITRAL VALVE REPLACEMENT (MVR) USING MEMO 4D 28 MM MITRAL VALVE (N/A) TRANSESOPHAGEAL ECHOCARDIOGRAM (TEE) (N/A) ENDOVEIN HARVEST OF GREATER SAPHENOUS VEIN (Right) CLIPPING OF ATRIAL APPENDAGE USING  ATRICURE [id number] ATRICLIP (Left)  Total Length of Stay:  LOS: 19 days   Subjective: Does not feel as well today due to back pain which is chronic.  Apparently had a reaction to a dose of tramadol yesterday with hallucinations.  He is calm, cooperative, and oriented now.   Objective: Vital signs in last 24 hours: Temp:  [97.8 F (36.6 C)-98.8 F (37.1 C)] 98.5 F (36.9 C) (09/01 2321) Pulse Rate:  [63-89] 63 (09/01 2321) Cardiac Rhythm: Atrial fibrillation;Bundle branch block (09/02 0700) Resp:  [17-26] 17 (09/01 2321) BP: (111-133)/(49-75) 111/49 (09/01 2321) SpO2:  [90 %-98 %] 91 % (09/01 2321) Weight:  [68.3 kg] 68.3 kg (09/01 2321)  Filed Weights   04/14/20 0435 04/15/20 0100 04/15/20 2321  Weight: 71.3 kg 70.5 kg 68.3 kg    Weight change: -2.2 kg     Intake/Output from previous day: 09/01 0701 - 09/02 0700 In: 600 [P.O.:600] Out: 2000 [Urine:2000]  Intake/Output this shift: No intake/output data recorded.  Current Meds: Scheduled Meds: . (feeding supplement) PROSource Plus  30 mL Oral BID BM  . atorvastatin  80 mg Oral Daily  . chlorhexidine  15 mL Mouth  Rinse BID  . Chlorhexidine Gluconate Cloth  6 each Topical Daily  . enoxaparin (LOVENOX) injection  30 mg Subcutaneous Q24H  . feeding supplement (GLUCERNA SHAKE)  237 mL Oral BID BM  . ferrous fumarate-b12-vitamic C-folic acid  1 capsule Oral BID PC  . insulin aspart  0-24 Units Subcutaneous TID WC  . insulin detemir  10 Units Subcutaneous Daily  . potassium chloride  30 mEq Oral Once  . potassium chloride  20 mEq Oral TID  . sodium chloride flush  3 mL Intravenous Q12H  . tamsulosin  0.4 mg Oral Daily  . torsemide  20 mg Oral BID  . traZODone  50 mg Oral QHS  . warfarin  5 mg Oral q1600  . Warfarin - Physician Dosing Inpatient   Does not apply q1600   Continuous Infusions: . sodium chloride     PRN Meds:.sodium chloride, senna-docusate, sodium chloride flush  General appearance: alert, cooperative and no distress Neurologic: No new deficits, has chronic LE weakness due to spinal stenosis.  Heart: Atrial fibrillation stable rate in 70's -80's.  Lungs: Breath sounds are clear, sats acceptable on RA. Abdomen: soft, NT.  Extremities: mild LE edema is improving. Wound: The sternal incision and RLE EVH incisions are dry. .  Lab Results: CBC: Recent Labs    04/15/20 0212  WBC 13.4*  HGB 8.7*  HCT 25.8*  PLT 277   BMET:  Recent Labs    04/15/20 0212 04/16/20 0214  NA 128* 130*  K 3.8 3.3*  CL 89* 86*  CO2 27 31  GLUCOSE 227* 105*  BUN 20 25*  CREATININE 0.70 0.76  CALCIUM 8.7* 9.0    CMET: Lab Results  Component Value Date   WBC 13.4 (H) 04/15/2020   HGB 8.7 (L) 04/15/2020   HCT 25.8 (L) 04/15/2020   PLT 277 04/15/2020   GLUCOSE 105 (H) 04/16/2020   ALT 19 04/09/2020   AST 17 04/09/2020   NA 130 (L) 04/16/2020   K 3.3 (L) 04/16/2020   CL 86 (L) 04/16/2020   CREATININE 0.76 04/16/2020   BUN 25 (H) 04/16/2020   CO2 31 04/16/2020   TSH 2.465 03/29/2020   INR 1.4 (H) 04/16/2020   HGBA1C 10.0 (H) 04/09/2020      PT/INR:  Recent Labs    04/16/20 0214   LABPROT 16.7*  INR 1.4*   Radiology: No results found.   Assessment/Plan: S/P Procedure(s) (LRB): CORONARY ARTERY BYPASS GRAFTING (CABG) X 6 USING LIMA TO LAD; ENDOSCOPICALLY HARVESTED RIGHT GREATER SAPHENOUS VEIN: SVG to D1; SVG seq to OM2 and OM3; SVG seq to PD and PL. AORTIC VALVE REPLACEMENT USING INSPIRIS RESILIA 23 MM AORTIC VALVE. 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 LEFT ATRIAL APPENDAGE CLIPPING USING ATRICURE PRO240 40 MM CLIP. (N/A) AORTIC VALVE REPLACEMENT (AVR) USING EDWARDS INSPIRIS RESILIA 23 MM AORTIC VALVE. (N/A) MITRAL VALVE REPLACEMENT (MVR) USING MEMO 4D 28 MM MITRAL VALVE (N/A) TRANSESOPHAGEAL ECHOCARDIOGRAM (TEE) (N/A) ENDOVEIN HARVEST OF GREATER SAPHENOUS VEIN (Right) CLIPPING OF ATRIAL APPENDAGE USING  ATRICURE [id number] ATRICLIP (Left)  --POD-6 CABG x 6, MV replacement with a 27mm bioprosthetic valve, MV annuloplasty, and clipping of left atrial appendage. BP stable. Overall progressing with mobility.   -Atrial fibrillation- has been having slow VR but this has improved.  On no beta blocker or antiarrhythmics. Will remove the pacer wires today. Anticoagulation initiated with Coumadin, INR remains 1.2. Will increase coumadin to 5mg  po daily.   -Hyponatremia- Improving, now 130. Continue to monitor.   -Hypokalemia- supplementing with K-dur tid, will give an additional today.   -Volume excess- net 1.4L diuresis yesterday  Wt.still several pounds above pre-op. Continue diuresis.   -Expected acute blood loss anemia-  Hct trending up. On Fe++ supplement. Monitor.   -Chronic left leg weakness from spinal stenosis- Continue PT / OT.  -History of major depression- Seems to be in better spirits. Continue trazodone as he was on pre-op.   -Disposition-  OT recommends discharge to SNF for further rehab. Care management team working SNF selection.    , PA-C [phone] 04/16/2020 8:16 AM    Chart reviewed, patient  examined, agree with above.

## 2020-04-16 NOTE — Progress Notes (Signed)
Nutrition Follow Up  DOCUMENTATION CODES:   Non-severe (moderate) malnutrition in context of chronic illness  INTERVENTION:    Glucerna Shake po BID, each supplement provides 220 kcal and 10 grams of protein  30 ml ProSource Plus BID, each supplement provides 100 kcals and 15 grams protein.   MVI daily   NUTRITION DIAGNOSIS:   Moderate Malnutrition related to chronic illness (CHF) as evidenced by mild fat depletion, moderate muscle depletion, severe muscle depletion.  Ongoing  GOAL:   Patient will meet greater than or equal to 90% of their needs  Progressing  MONITOR:   PO intake, Supplement acceptance, Weight trends, Labs, I & O's  REASON FOR ASSESSMENT:   Consult Assessment of nutrition requirement/status  ASSESSMENT:   Patient with medical history significant for hypertension and diabetes not on home medication, tobacco abuse and prior history of alcohol abuse (quit 10 years ago). Presents this admission with CHF exacerbation and R leg weakness.   8/20- multiple tooth extractions 8/26- CABG x6, MVR, AVR  8/27- extubated   Had issues with pain yesterday and this am. Appetite progressing. Last 6 meal completions documented as 50-100%. Supplement intake looks to fluctuate. Continue current interventions.   Admission weight: 67.2 kg  Current weight: 68.3 kg   Medications: SS novolog, levemir, demedex Labs: Na 130 (L) K 3.3 (L) CBG 110-185  Diet Order:   Diet Order            Diet Carb Modified Fluid consistency: Thin; Room service appropriate? Yes  Diet effective now                 EDUCATION NEEDS:   Not appropriate for education at this time  Skin:  Skin Assessment: Skin Integrity Issues: Skin Integrity Issues:: Incisions, Stage II Stage II: buttocks Incisions: R leg, chest  Last BM:  8/31  Height:   Ht Readings from Last 1 Encounters:  04/07/20 5\' 7"  (1.702 m)    Weight:   Wt Readings from Last 1 Encounters:  04/15/20 68.3 kg     Ideal Body Weight:  67.3 kg  BMI:  Body mass index is 23.58 kg/m.  Estimated Nutritional Needs:   Kcal:  2050-2250 kcal  Protein:  105-120 grams  Fluid:  >/= 2 L/day   06/15/20 RD, LDN Clinical Nutrition Pager listed in [facility]ION

## 2020-04-16 NOTE — TOC Initial Note (Signed)
Transition of Care [facility]) - Initial/Assessment Note    Patient Details  Name: Scott Rios MRN: [id number] Date of Birth: 05/25/1956  [facility]) CM/SW Contact:    Vinie Sill, [place] Phone Number: 04/16/2020, 4:10 PM  Clinical Narrative:                  CSW met with patient at bedside. CSW introduced self, explained role and reason for consult. Patient states he understands and states " I done something stupid". "But I am not going to do that again". He states " I felt alone, and felt like family was shutting me out, but know I know better".  Patient shared he has a lot to live for. He states he has his son, his ex-wife and his neighbor that is like a son to him. Patient was emotional and states he realizes he has people that care about him. Patient denied thoughts of harming self or others. He states "the only thing on my mind is getting out of here and being with my family". CSW explained psychiatry recommended inpatient psych but he will be re-evaluated by to determine if recommendation remains appropriate. Patient states he understands. CSW discussed with patient healthy alternatives to deal with stress and encouraged the patient to seek mental health counseling to help process his grief and loss experiences. CSW provide patient with mental health resources and explained the benefits of talking with a mental health provider. Patient states he understands.    Thurmond Butts, MSW, [place] Clinical Social Worker   Expected Discharge Plan: Skilled Nursing Facility Barriers to Discharge: Continued Medical Work up   Patient Goals and CMS Choice Patient states their goals for this hospitalization and ongoing recovery are:: to go to SNF CMS Medicare.gov Compare Post Acute Care list provided to:: Patient Choice offered to / list presented to : Patient  Expected Discharge Plan and Services Expected Discharge Plan: [place]   Discharge Planning Services: CM  Consult Post Acute Care Choice: Durable Medical Equipment, Home Health Living arrangements for the past 2 months: [place]                 DME Arranged: 3-N-1, Walker rolling DME Agency: [employer] Date DME Agency Contacted: 03/30/20 Time DME Agency Contacted: 12 Representative spoke with at DME Agency: [place] Arranged: PT, OT [place] Agency: [place] Date [place]: 03/30/20 Time [place]: 29 Representative spoke with at [place]: Adela Lank  Prior Living Arrangements/Services Living arrangements for the past 2 months: [place] with:: Self, Adult Children Patient language and need for interpreter reviewed:: Yes Do you feel safe going back to the place where you live?: No   SNF  Need for Family Participation in Patient Care: Yes (Comment) Care giver support system in place?: Yes (comment)   Criminal Activity/Legal Involvement Pertinent to Current Situation/Hospitalization: No - Comment as needed  Activities of Daily Living Home Assistive Devices/Equipment: Cane (specify quad or straight) ADL Screening (condition at time of admission) Patient's cognitive ability adequate to safely complete daily activities?: No Is the patient deaf or have difficulty hearing?: No Does the patient have difficulty seeing, even when wearing glasses/contacts?: No Does the patient have difficulty concentrating, remembering, or making decisions?: No Patient able to express need for assistance with ADLs?: Yes Does the patient have difficulty dressing or bathing?: No Independently performs ADLs?: Yes (appropriate for developmental age) Does the patient have difficulty walking or  climbing stairs?: No Weakness of Legs: Both Weakness of Arms/Hands: None  Permission Sought/Granted Permission sought to share information with : Case Manager, Family Supports, Chartered certified accountant granted to share information with :  Yes, Verbal Permission Granted  Share Information with NAME: Broadus John  Permission granted to share info w AGENCY: SNF  Permission granted to share info w Relationship: son  Permission granted to share info w Contact Information: Broadus John [phone]  Emotional Assessment Appearance:: Appears stated age Attitude/Demeanor/Rapport: Gracious Affect (typically observed): Calm Orientation: : Oriented to Self, Oriented to Place, Oriented to  Time, Oriented to Situation Alcohol / Substance Use: Not Applicable Psych Involvement: No (comment)  Admission diagnosis:  Hyponatremia [E87.1] Acute pulmonary edema (HCC) [J81.0] Elevated troponin [R77.8] Right leg weakness [R29.898] Weakness of right lower extremity [R29.898] S/P CABG x 6 [Z95.1] Patient Active Problem List   Diagnosis Date Noted  . Pressure injury of skin 04/12/2020  . Adjustment disorder with depressed mood 04/11/2020  . Malnutrition of moderate degree 04/10/2020  . S/P CABG x 6 04/09/2020  . Non-ST elevation (NSTEMI) myocardial infarction ([place])   . Right leg weakness 03/28/2020  . Flash pulmonary edema ([place]) 03/28/2020  . Hypomagnesemia 03/28/2020  . Hypoalbuminemia 03/28/2020  . Hyperglycemia 03/28/2020  . Hyponatremia 03/28/2020  . Elevated troponin 03/28/2020  . Pulmonary nodule 03/28/2020  . Acute respiratory failure with hypoxia ([place]) 03/28/2020  . Tobacco abuse 03/28/2020  . Acute pulmonary edema (HCC)   . Weakness of right lower extremity   . Acute systolic heart failure ([place])   . Mitral regurgitation   . Nonrheumatic aortic valve insufficiency    PCP:  Redmond School, MD Pharmacy:   [facility] [place] [place] - [address] [place] [address] Phone: [phone] Fax: [phone]     Social Determinants of Health ([place]) Interventions    Readmission Risk Interventions No flowsheet data found.

## 2020-04-16 NOTE — Progress Notes (Signed)
Mobility Specialist: Progress Note    04/16/20 1648  Mobility  Activity Ambulated in hall  Level of Assistance Moderate assist, patient does 50-74%  Assistive Device Front wheel walker  Distance Ambulated (ft) 172 ft (50', 172')  Mobility Response Tolerated well  Mobility performed by Mobility specialist  $Mobility charge 1 Mobility   Pre-Mobility: 84 HR, 127/60 BP, 92% SpO2 Post-Mobility: 84 HR, 148/64 BP, 95% SpO2  Pt stopped to take one sated rest break 50' into ambulation. Pt had no c/o of SOB, dizziness, or feeling light headed. Pt is mod assist to stand and contact guard while ambulating.   [facility] Veldon Wager Mobility Specialist

## 2020-04-16 NOTE — Progress Notes (Signed)
1437-1450 HR irregular, rate from 50-80. Some dropped beats, PVCs. Pt sleeping. Woke pt to see if he would walk with [place]. BP 108/53 and sats 91% RA. Pt stated he did not feel up to walking at this time. Dozed back off . Encouraged pt to walk with Mobility Staff later. RN aware of irregular rhythm. Has walked once with PT earlier. Luetta Nutting RN BSN 04/16/2020 2:51 PM

## 2020-04-16 NOTE — Progress Notes (Signed)
Physical Therapy Treatment Patient Details Name: Scott Rios MRN: [id number] DOB: 08/09/1956 Today's Date: 04/16/2020    History of Present Illness 64 yo admitted 8/13 with acute pulmonary edema, Severe AR and moderate MR. 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 PMHx: Dm, HTN, ETOH and tobacco abuse    PT Comments    Pt tolerates treatment well, declining long distance ambulation this session as he reports "overdoing it" yesterday resulting in increased back pain. At end of session pt reports he "probably could have walked further". Pt continues to require physical assistance to power up into standing due to LE weakness and will require assistance at home for most ADLs due to sternal precautions. Pt is unable to confirm assistance for all mobility at this time, thus PT continues to recommend SNF placement. If pt has assistance for all mobility and ADLs the pt may be able to discharge home.  Follow Up Recommendations  SNF;Supervision/Assistance - 24 hour (pt may be able to go home with assist for all mobility)     Equipment Recommendations  Rolling walker with 5" wheels;3in1 (PT)    Recommendations for Other Services       Precautions / Restrictions Precautions Precautions: Sternal;Fall;Other (comment) Precaution Booklet Issued: No Precaution Comments: PT reviiews sternal precautions, pt recalls well, suicide precautions Restrictions Weight Bearing Restrictions: No    Mobility  Bed Mobility                  Transfers Overall transfer level: Needs assistance Equipment used: Rolling walker (2 wheeled) Transfers: Sit to/from Stand Sit to Stand: Min guard;Min assist         General transfer comment: minA from lower surfaces to power up, verbal cues for technique  Ambulation/Gait Ambulation/Gait assistance: Min guard Gait Distance (Feet): 50 Feet (50' x 2) Assistive device: Rolling walker (2 wheeled) Gait Pattern/deviations: Step-through pattern;Trunk  flexed Gait velocity: decr Gait velocity interpretation: 1.31 - 2.62 ft/sec, indicative of limited community ambulator General Gait Details: pt with shortened step through gait, increased trunk and neck flexion   Stairs             Wheelchair Mobility    Modified Rankin (Stroke Patients Only)       Balance Overall balance assessment: Needs assistance Sitting-balance support: No upper extremity supported;Feet supported Sitting balance-Leahy Scale: Good     Standing balance support: Bilateral upper extremity supported;Single extremity supported Standing balance-Leahy Scale: Poor Standing balance comment: reliant on UE support for balance during standing                            Cognition Arousal/Alertness: Awake/alert Behavior During Therapy: WFL for tasks assessed/performed Overall Cognitive Status: No family/caregiver present to determine baseline cognitive functioning                                 General Comments: pt follows commands well and is able to recall sternal precautions, does require some cues during session to apply precautions during mobility      Exercises      General Comments General comments (skin integrity, edema, etc.): VSS on RA      Pertinent Vitals/Pain Pain Assessment: Faces Faces Pain Scale: Hurts even more Pain Location: back Pain Descriptors / Indicators: Grimacing Pain Intervention(s): Monitored during session    Home Living  Prior Function            PT Goals (current goals can now be found in the care plan section) Acute Rehab PT Goals Patient Stated Goal: regain his independence for home Progress towards PT goals: Progressing toward goals    Frequency    Min 3X/week      PT Plan Current plan remains appropriate    Co-evaluation              AM-PAC PT "6 Clicks" Mobility   Outcome Measure  Help needed turning from your back to your side while  in a flat bed without using bedrails?: A Little Help needed moving from lying on your back to sitting on the side of a flat bed without using bedrails?: A Little Help needed moving to and from a bed to a chair (including a wheelchair)?: A Little Help needed standing up from a chair using your arms (e.g., wheelchair or bedside chair)?: A Little Help needed to walk in hospital room?: A Little Help needed climbing 3-5 steps with a railing? : A Lot 6 Click Score: 17    End of Session   Activity Tolerance: Patient tolerated treatment well Patient left: in chair;with call bell/phone within reach;with nursing/sitter in room Nurse Communication: Mobility status PT Visit Diagnosis: Unsteadiness on feet (R26.81);Muscle weakness (generalized) (M62.81);Difficulty in walking, not elsewhere classified (R26.2);Other abnormalities of gait and mobility (R26.89)     Time: 3716-9678 PT Time Calculation (min) (ACUTE ONLY): 32 min  Charges:  $Gait Training: 8-22 mins $Therapeutic Activity: 8-22 mins                     Arlyss Gandy, PT, DPT Acute Rehabilitation Pager: [phone]    Arlyss Gandy 04/16/2020, 12:34 PM

## 2020-04-17 ENCOUNTER — Other Ambulatory Visit: Payer: Self-pay

## 2020-04-17 DIAGNOSIS — Z952 Presence of prosthetic heart valve: Secondary | ICD-10-CM

## 2020-04-17 LAB — BASIC METABOLIC PANEL
Anion gap: 9 (ref 5–15)
BUN: 26 mg/dL — ABNORMAL HIGH (ref 8–23)
CO2: 36 mmol/L — ABNORMAL HIGH (ref 22–32)
Calcium: 9.1 mg/dL (ref 8.9–10.3)
Chloride: 89 mmol/L — ABNORMAL LOW (ref 98–111)
Creatinine, Ser: 0.89 mg/dL (ref 0.61–1.24)
GFR calc Af Amer: >60 mL/min (ref >60)
GFR calc non Af Amer: >60 mL/min (ref >60)
Glucose, Bld: 182 mg/dL — ABNORMAL HIGH (ref 70–99)
Potassium: 3.6 mmol/L (ref 3.5–5.1)
Sodium: 134 mmol/L — ABNORMAL LOW (ref 135–145)

## 2020-04-17 LAB — GLUCOSE, CAPILLARY
Glucose-Capillary: 230 mg/dL — ABNORMAL HIGH (ref 70–99)
Glucose-Capillary: 215 mg/dL — ABNORMAL HIGH (ref 70–99)
Glucose-Capillary: 236 mg/dL — ABNORMAL HIGH (ref 70–99)
Glucose-Capillary: 150 mg/dL — ABNORMAL HIGH (ref 70–99)
Glucose-Capillary: 106 mg/dL — ABNORMAL HIGH (ref 70–99)

## 2020-04-17 LAB — PROTIME-INR
INR: 2.5 — ABNORMAL HIGH (ref 0.8–1.2)
Prothrombin Time: 26.2 seconds — ABNORMAL HIGH (ref 11.4–15.2)

## 2020-04-17 MED ORDER — METFORMIN HCL 500 MG PO TABS
500.0000 mg | ORAL_TABLET | Freq: Two times a day (BID) | ORAL | Status: DC
  Administered 2020-04-17 – 2020-04-18 (×3): 500 mg via ORAL
  Filled 2020-04-17 (×3): qty 1

## 2020-04-17 MED ORDER — WARFARIN SODIUM 2.5 MG PO TABS
2.5000 mg | ORAL_TABLET | Freq: Every day | ORAL | Status: DC
  Administered 2020-04-17: 2.5 mg via ORAL
  Filled 2020-04-17: qty 1

## 2020-04-17 MED ORDER — POTASSIUM CHLORIDE CRYS ER 20 MEQ PO TBCR
20.0000 meq | EXTENDED_RELEASE_TABLET | Freq: Three times a day (TID) | ORAL | Status: DC
  Administered 2020-04-17 – 2020-04-18 (×4): 20 meq via ORAL
  Filled 2020-04-17 (×5): qty 1

## 2020-04-17 NOTE — Discharge Summary (Addendum)
Physician Discharge Summary  Patient ID: Scott Rios MRN: [id number] DOB/AGE: October 17, 1955 64 y.o.  Admit date: 03/27/2020 Discharge date: 04/18/2020  Admission Diagnoses: Right leg weakness Flash pulmonary edema Acute respiratory failure Tobacco abuse Past history of alcohol abuse  Discharge Diagnoses:   Multivessel coronary artery disease Severe aortic stenosis with insufficiency Mitral valve insufficiency Status post coronary bypass grafting x6 Status post aortic valve replacement Status post mitral valve annuloplasty Acute pulmonary edema Acute systolic heart failure History of depression Postoperative atrial fibrillation Hyperglycemia   Discharged Condition: good  History of Present Illness:  The patient is a 65 year old gentleman with a history of untreated and poorly controlled diabetes, hypertension, ongoing heavy smoking and COPD, and previous EtOH abuse who reports being in his usual state of health until last Friday when he tried to get up out of a chair and could not stand on his right leg due to weakness and numbness.  He says that he has never had this symptom before although there is some mention in the cardiology consultation note that he had some symptoms over the previous 2 months.  He denies any other extremity weakness.  He had no difficulty with speech or swallowing.  There were no visual changes.  He came to the emergency department and was hemodynamically stable.  Work-up in the emergency room showed hyponatremia, hyperglycemia, magnesium 1.4, and elevated troponin of 27 and 121.  SARS coronavirus was negative.  His initial chest x-ray showed borderline cardiomegaly with diffuse interstitial thickening suspicious for pulmonary edema.  He had a CT scan of the chest which showed no evidence of aortic dissection.  There is mild ectasia of the ascending aorta at 3.7 cm.  There were moderate bilateral pleural effusions with associated bibasilar atelectasis and some  patchy groundglass opacification of the mid upper lungs suggesting possibility of infection or inflammation.  He became acutely short of breath after the CT scan and was given IV Lasix and placed on BiPAP with improvement.  Chest x-ray showed mild diffuse pulmonary interstitial edema.  He had a CT scan of the head which showed no acute abnormality.  A CT of the lumbar spine without contrast showed no acute abnormality within the lumbar spine but did show multifactorial degenerative changes at L4-5 with resultant severe spinal stenosis.  He was transferred to [facility] and had an MRI of the head which showed chronic microvascular changes but no acute stroke.  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  His symptoms improved gradually over admission and he was walking the hall yesterday with a walker.  He feels like his strength is close to normal.   He had a 2D echocardiogram showing an ejection fraction of 35 to 40% with regional wall motion abnormalities and grade 2 diastolic dysfunction.  There was moderate hypokinesis of the left ventricular, mid apical, inferior septal, and inferior walls.  There is moderate mitral regurgitation.  There was severe aortic insufficiency with possible bicuspid aortic valve.  There is mild aortic stenosis.  The ascending aorta was measured at 38 mm.  Cardiac catheterization showed severe multivessel coronary disease.  The LAD was occluded in its midportion with filling beyond that by collaterals from a large diagonal branch that had about 40 to 50% stenosis.  The left circumflex had 75% proximal stenosis at the takeoff of a first marginal branch that was occluded.  There is a third marginal branch that 75% stenosis.  The right coronary artery had 80% mid vessel stenosis and  was diffusely diseased.  There is 90% PDA stenosis.  PA pressure was 28/12.  LVEDP was 14.  TEE today showed an EF of 35 to 40%.  The aortic valve appeared functionally bicuspid with severe AI.   The mitral valve had annular dilatation with at least moderate central MR.  There is a focal eccentric jet with possible cleft or perforation adjacent to P2.   He denies any history of chest pain or pressure.  Had some mild shortness of breath a few days prior to admission.  He denies orthopnea and PND.  He has had some lower extremity edema.  He denies dizziness and syncope.  He has had exertional fatigue for several months.   The patient does not work.  He lives in [place] with his son.  He has a long history of alcohol abuse but quit about 10 years ago.  He has a long smoking history previous 2 and half pack per day for close to 50 years.  He has cut down over the past year to about 1 pack/day.  He has not seen a dentist or physician in at least 15 years.   Course in Hospital:  The patient remained stable following left heart catheterization.  We requested dental evaluation and the patient was seen by Dr. Valentino HueKalinsky.  He recommended several dental extractions.  He was taken to the OR on 04/03/2020 multiple tooth extractions were carried out along with 3 quadrant alveoloplasty and bilateral mandibular lingual tori reductions.  He again remained stable while he recovered from this procedure.  He was taken to the operating room on 8 04/09/2020 where under general anesthesia 6 vessel coronary bypass grafting was carried out along with mitral valve annuloplasty with a 28 mm Sorin memo 4D annuloplasty ring, aortic valve replacement with a 23 mm Inspiris resilient pericardial tissue valve.  And clipping of the left atrial appendage.  Following the procedure, he separated from cardiopulmonary bypass on epinephrine, Neo-Synephrine, and milrinone.  He was transferred to the surgical ICU in stable condition.  He had a coagulopathy early postoperatively that was treated with infusions of fresh frozen plasma, platelets, and packed red blood cells.  Chest tube drainage slowed and subsided.  He was weaned from  mechanical ventilation and extubated at around midnight following surgery.  The inotropic support was weaned on the first postoperative day.  He was mobilized.  He developed atrial flutter with slow ventricular response. Rapid atrial pacing was attempted but was not successful in reestablishing SR.  He was anticoagulated with Coumadin.  His INR at discharge was 2.9 He was not given any beta blocking agents or antiarrhythmics due to his slow ventricular rate. He developed some confusion and depression. This improved after resuming his usual dose of trazodone.   He was transferred to 4E Progressive [facility].  He remained in atrial fibrillation with ventricular rates in the 60-70 range. We continued to avoid nodal blocking agents due to his tendency to be bradycardic. He was anticoagulated with Coumadin. At the time os discharge his INR was 2.9 He made slow progress with returning to independent mobility and resuming self car activities. The physical therapy team initially recommended transition to a skilled nursing facility for further physical rehab prior to returning home. As he progressed, the decision was made for home to return home with his son and with home health. The transitions of care team assisted with arranging home health services and necessary equipment.   Consults: psychiatry  Significant Diagnostic Studies:   Diagnostics:  RIGHT/LEFT HEART CATH AND CORONARY ANGIOGRAPHY  Conclusion     Prox RCA to Mid RCA lesion is 80% stenosed.  RPDA lesion is 90% stenosed.  1st Mrg lesion is 100% stenosed.  3rd Mrg lesion is 75% stenosed.  Prox Cx to Mid Cx lesion is 75% stenosed.  Mid LAD lesion is 100% stenosed.  Mid LAD to Dist LAD lesion is 70% stenosed.  2nd Diag lesion is 40% stenosed.  Prox LAD to Mid LAD lesion is 25% stenosed.  LV end diastolic pressure is normal.  There is moderate (3+) aortic regurgitation.  There is mild left ventricular systolic dysfunction.  The left  ventricular ejection fraction is 35-45% by visual estimate.  Right heart results on room air: Ao sat 90%, PA sat 57%, PA pressure 28/12, mean PA 18 mm Hg; mean PCWP 17 mm Hg; CO 4.1 L/min; CI 2.33   Severe three vessel disease.  Moderate to severe aortic insufficiency.   Plan for TEE tomorrow.    Will need cardiac surgery consult for CABG and likely AVR.   Results conveyed to his son, Aurelio Brash.    Treatments: {  04/09/2020  5:27 PM Op Note signed by Alleen Borne, MD  04/09/2020  4:23 PM Operative Note - Scan signed by Default, Provider, MD  03/31/2020 10:23 AM CV Procedure signed by Jodelle Red, MD    Discharge Exam: Blood pressure 119/73, pulse 94, temperature 98.3 F (36.8 C), temperature source Oral, resp. rate 17, height 5\' 7"  (1.702 m), weight 66.1 kg, SpO2 98 %.  General appearance: alert, cooperative and no distress Heart: regular rate and rhythm Lungs: min dim in bases Abdomen: benign Extremities: + BLE edema Wound: incis healing well Disposition: Discharge disposition: 01-Home or Self Care       Discharge Instructions    Amb Referral to Cardiac Rehabilitation   Complete by: As directed    Diagnosis:  CABG Valve Replacement Valve Repair     Valve:  Aortic Mitral     CABG X ___: 6   After initial evaluation and assessments completed: Virtual Based Care may be provided alone or in conjunction with Phase 2 Cardiac Rehab based on patient barriers.: Yes   Ambulatory referral to Nutrition and Diabetic Education   Complete by: As directed    Discharge patient   Complete by: As directed    Discharge disposition: 01-Home or Self Care   Discharge patient date: 04/18/2020     Allergies as of 04/18/2020      Reactions   Tramadol Other (See Comments)   Itchiness, visual hallucination.   Penicillins     Unknown reaction was told it happened as a child      Medication List    TAKE these medications   acetaminophen 325 MG tablet Commonly known as:  TYLENOL Take 2 tablets (650 mg total) by mouth every 6 (six) hours as needed for mild pain or fever.   atorvastatin 80 MG tablet Commonly known as: LIPITOR Take 1 tablet (80 mg total) by mouth daily.   ferrous fumarate-b12-vitamic C-folic acid capsule Commonly known as: TRINSICON / FOLTRIN Take 1 capsule by mouth 2 (two) times daily after a meal.   metFORMIN 500 MG tablet Commonly known as: GLUCOPHAGE Take 1 tablet (500 mg total) by mouth 2 (two) times daily with a meal.   potassium chloride SA 20 MEQ tablet Commonly known as: KLOR-CON Take 1 tablet (20 mEq total) by mouth 2 (two) times daily.   tamsulosin 0.4 MG Caps capsule  Commonly known as: FLOMAX Take 1 capsule (0.4 mg total) by mouth daily.   torsemide 20 MG tablet Commonly known as: DEMADEX Take 1 tablet (20 mg total) by mouth 2 (two) times daily.   warfarin 2 MG tablet Commonly known as: COUMADIN Take 1 tablet (2 mg total) by mouth daily at 4 PM. As directed by the coumadin clinic       Follow-up Information    Care, [facility] Follow up.   Specialty: Home Health Services Why: A representative from [facility] will contact you to arrange start date and time for your therapy. Contact information: [address] [place] [address] [phone]        Elfredia Nevins, MD Follow up.   Specialty: Internal Medicine Contact information: [address] [place] [address] [phone]        Schedule an appointment as soon as possible for a visit with Charlynne Pander, DDS.   Specialty: Dentistry Why: Patient to call for follow-up appointment for evaluation of healing and suture removal as needed once discharged from anticipated heart valve surgery. Contact information: [address] [place] [address] [phone]        [facility] Follow up on 04/22/2020.   Specialty: Cardiology Why: Please arrive 15 minutes early for your  10:00am COUMADIN CLINIC appt. You will have your INR checked at this visit and receive guidance on how to take your coumadin going forward. ADDITIONAL APPT: 06/03/20 at 9am for a follow-up ultrasound of your heart. Contact information: [address] [place] [address] [phone]       Netta Neat., NP Follow up on 04/24/2020.   Specialty: Cardiology Why: Please arrive 15 minutes early for your 2pm post-hospita cardiology appointment Contact information: [address] [place] [address] [phone]              The patient has been discharged on:   1.Beta Blocker:  Yes [   ]                              No   [ x  ]                              If No, reason: Bradycardia 2.Ace Inhibitor/ARB: Yes [   ]                                     No  [    ]                                     If No, reason:Hypotension  3.Statin:   Yes [ x  ]                  No  [   ]                  If No, reason:  4.Ecasa:  Yes  [  x ]                  No   [   ]  If No, reason:     Signed: Rowe Clack, PA-C 04/18/2020, 12:34 PM

## 2020-04-17 NOTE — TOC Progression Note (Signed)
Transition of Care [facility]) - Progression Note    Patient Details  Name: Scott ALFIERI MRN: [id number] Date of Birth: 06-Mar-1956  Transition of Care [facility]) CM/SW Contact  Eduard Roux, [place] Phone Number: 04/17/2020, 10:05 AM  Clinical Narrative:     CSW still waiting on patient to be re-evaluated by psychiatry Patient has declined SNF and wants to go home with home health if cleared by psych  CSW will continue to follow and assist with discharge planning.  Antony Blackbird, MSW, LCSWA Clinical Social Worker   Expected Discharge Plan: Skilled Nursing Facility Barriers to Discharge: Continued Medical Work up  Expected Discharge Plan and Services Expected Discharge Plan: Skilled Nursing Facility   Discharge Planning Services: CM Consult Post Acute Care Choice: Durable Medical Equipment, Home Health Living arrangements for the past 2 months: Single Family Home                 DME Arranged: 3-N-1, Walker rolling DME Agency: [employer] Date DME Agency Contacted: 03/30/20 Time DME Agency Contacted: [phone] Representative spoke with at DME Agency: Oletha Cruel HH Arranged: PT, OT HH Agency: [facility] Date [facility] Agency Contacted: 03/30/20 Time HH Agency Contacted: 1110 Representative spoke with at [facility] Agency: Lorenza Chick   Social Determinants of Health (SDOH) Interventions    Readmission Risk Interventions No flowsheet data found.

## 2020-04-17 NOTE — Progress Notes (Signed)
PT Cancellation Note  Patient Details Name: SOMNANG MAHAN MRN: [id number] DOB: 1955-08-26   Cancelled Treatment:    Reason Eval/Treat Not Completed: Other (comment) (pt eating breakfast will reattempt)   Ariana Cavenaugh B Augustina Braddock 04/17/2020, 7:27 AM  Merryl Hacker, PT Acute Rehabilitation Services Pager: [phone] Office: [phone]

## 2020-04-17 NOTE — Progress Notes (Signed)
Progress Note  Patient Name: Scott Rios Date of Encounter: 04/17/2020  Primary Cardiologist:   No primary care provider on file.   Subjective   No pain. No SOB.   Inpatient Medications    Scheduled Meds: . (feeding supplement) PROSource Plus  30 mL Oral BID BM  . atorvastatin  80 mg Oral Daily  . chlorhexidine  15 mL Mouth Rinse BID  . Chlorhexidine Gluconate Cloth  6 each Topical Daily  . feeding supplement (GLUCERNA SHAKE)  237 mL Oral BID BM  . ferrous fumarate-b12-vitamic C-folic acid  1 capsule Oral BID PC  . insulin aspart  0-24 Units Subcutaneous TID WC  . insulin detemir  10 Units Subcutaneous Daily  . metFORMIN  500 mg Oral BID WC  . potassium chloride  20 mEq Oral TID  . potassium chloride  20 mEq Oral TID  . sodium chloride flush  3 mL Intravenous Q12H  . tamsulosin  0.4 mg Oral Daily  . torsemide  20 mg Oral BID  . traZODone  50 mg Oral QHS  . warfarin  2.5 mg Oral q1600  . Warfarin - Physician Dosing Inpatient   Does not apply q1600   Continuous Infusions: . sodium chloride     PRN Meds: sodium chloride, acetaminophen, senna-docusate, sodium chloride flush   Vital Signs    Vitals:   04/16/20 0956 04/16/20 1205 04/16/20 2108 04/17/20 0439  BP: (!) 115/59 (!) 111/46 (!) 104/56 125/65  Pulse: 76 66  90  Resp: 16 18  20   Temp: 98.5 F (36.9 C) 98.6 F (37 C) 98.4 F (36.9 C) 98.2 F (36.8 C)  TempSrc: Oral Oral Oral Oral  SpO2: 94% 94% 93% 95%  Weight:    66.5 kg  Height:        Intake/Output Summary (Last 24 hours) at 04/17/2020 0815 Last data filed at 04/16/2020 2328 Gross per 24 hour  Intake 240 ml  Output 2250 ml  Net -2010 ml   Filed Weights   04/15/20 0100 04/15/20 2321 04/17/20 0439  Weight: 70.5 kg 68.3 kg 66.5 kg    Telemetry    Atrial fib.  Occasional regular rhythm that looks like NSR with first degree AV block and some blocked PACs..   - Personally Reviewed  ECG    NA - Personally Reviewed  Physical Exam   GEN: No   acute distress.   Neck: No  JVD Cardiac:  Irregular RR, no murmurs, positive  Soft positive rub, no gallops.  Respiratory:   Few decreased breath sounds at the right base.   GI: Soft, nontender, non-distended, normal bowel sounds  MS:  Moderate (reduced) leg edema; No deformity. Neuro:   Nonfocal  Psych: Oriented and appropriate    Labs    Chemistry Recent Labs  Lab 04/14/20 0238 04/15/20 0212 04/16/20 0214  NA 129* 128* 130*  K 4.3 3.8 3.3*  CL 92* 89* 86*  CO2 28 27 31   GLUCOSE 92 227* 105*  BUN 21 20 25*  CREATININE 0.69 0.70 0.76  CALCIUM 8.6* 8.7* 9.0  GFRNONAA >60 >60 >60  GFRAA >60 >60 >60  ANIONGAP 9 12 13      Hematology Recent Labs  Lab 04/12/20 0053 04/13/20 0127 04/15/20 0212  WBC 17.5* 16.1* 13.4*  RBC 2.46* 2.87* 2.93*  HGB 7.6* 8.7* 8.7*  HCT 22.6* 25.6* 25.8*  MCV 91.9 89.2 88.1  MCH 30.9 30.3 29.7  MCHC 33.6 34.0 33.7  RDW 14.8 15.5 14.7  PLT 156  191 277    Cardiac EnzymesNo results for input(s): TROPONINI in the last 168 hours. No results for input(s): TROPIPOC in the last 168 hours.   BNPNo results for input(s): BNP, PROBNP in the last 168 hours.   DDimer No results for input(s): DDIMER in the last 168 hours.   Radiology    No results found.  Cardiac Studies   Diagnostic Dominance: Right   TEE: Severe aortic insufficiency, moderate to severe mitral regurgitation, EF 35%   Patient Profile     64 y.o. male with a hx of DM and HTN, ETOH abuse and tobacco abusewho is being seen in consultation for the evaluation of acute pulmonary edema, LV dysfunction, moderate MR and severe ARat the request of Marguerita Merles, MD.  Assessment & Plan    ATRIAL FIB:   Slow ventricular rate.  On warfarin.  Avoiding amiodarone and negative chronotropes.    INR is therapeutic.   We will follow his rhythm with out patient monitors.    ISCHEMIC CARDIOMYOPATHY:    Net negative 12 liters.  Now on oral diuretic.   BP is labile.  We can follow and  likely start low dose ARB as an out patient.    CAD:  CABG/AVR/MVR day 8.    DISPOSITION:  He lives in [place].    Please let us know when he is discharged and we will get follow up in the [facility] office.   For questions or updates, please contact [facility] Please consult [email or web address] for contact info under Cardiology/STEMI.   Signed, Rollene Rotunda, MD  04/17/2020, 8:15 AM

## 2020-04-17 NOTE — Progress Notes (Signed)
CARDIAC REHAB PHASE I   PRE:  Rate/Rhythm: 88 1HB/afib  BP:  Supine:   Sitting: 105/64  Standing:    SaO2: 97%RA  MODE:  Ambulation: 180 ft   POST:  Rate/Rhythm: 89 1HB SR?afib  BP:  Supine:   Sitting: 145/68  Standing:    SaO2: 98%RA 1113-1145 Pt receptive to walking again. In very good spirits. Did not need to sit during walk but took several standing rest breaks. Needed assistance to stand , even with rocking. Walked 180 ft on RA with gait belt use, rolling walker and asst x 1. Back to recliner with sitter in room. Encouraged pt to walk with Mobility team later. Pt stated he has walker at home. Pt told me he was trying to stand up more, keep feet apart and stay close to walker during walk.   Luetta Nutting, RN BSN  04/17/2020 11:42 AM

## 2020-04-17 NOTE — Progress Notes (Signed)
Physical Therapy Treatment Patient Details Name: Scott Rios MRN: [id number] DOB: 02/27/1956 Today's Date: 04/17/2020    History of Present Illness 64 yo admitted 8/13 with acute pulmonary edema, Severe AR and moderate MR. 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 PMHx: Dm, HTN, ETOH and tobacco abuse    PT Comments    Pt very pleasant and eager to mobilize today. Pt with increased right quad strength noted today without buckling during gait and no LOB. Pt with significantly improved gait but continues to struggle with rise from chair with cues for sequence. Pt aware of precautions and only required cues to maintain with scooting. Pt educated for HEP and continued progression. Pt deferred practicing stairs but will need to perform if able to arrange 24hr assist for return home vs SNF.   HR 92-97 Pre gait 114/69 (82) Post gait 146/72 (93)    Follow Up Recommendations  SNF;Supervision/Assistance - 24 hour;Home health PT (HHPT if 24 hr assist available)     Equipment Recommendations  Rolling walker with 5" wheels;3in1 (PT)    Recommendations for Other Services       Precautions / Restrictions Precautions Precautions: Sternal;Fall;Other (comment) Precaution Comments: pt able to recall all precautions other than lifting, suicide    Mobility  Bed Mobility               General bed mobility comments: pt in chair on arrival and end of session  Transfers Overall transfer level: Needs assistance Equipment used: Rolling walker (2 wheeled) Transfers: Sit to/from Stand Sit to Stand: Min assist         General transfer comment: min assist to power up with cues for hands on thighs with scooting and standing, pt using rocking for momentum  Ambulation/Gait Ambulation/Gait assistance: Min guard Gait Distance (Feet): 200 Feet Assistive device: Rolling walker (2 wheeled) Gait Pattern/deviations: Step-through pattern;Trunk flexed   Gait velocity interpretation: >2.62 ft/sec,  indicative of community ambulatory General Gait Details: cues for posture and looking up. Pt without knee buckling today and able to complete gait with 2 standing rests. pt maintained wider BOS throughout   Stairs Stairs:  (pt denied attempting)           Wheelchair Mobility    Modified Rankin (Stroke Patients Only)       Balance Overall balance assessment: Needs assistance   Sitting balance-Leahy Scale: Good       Standing balance-Leahy Scale: Poor Standing balance comment: reliant on UE support for balance during standing                            Cognition Arousal/Alertness: Awake/alert Behavior During Therapy: WFL for tasks assessed/performed Overall Cognitive Status: Within Functional Limits for tasks assessed                                        Exercises General Exercises - Lower Extremity Long Arc Quad: AROM;Both;Seated;20 reps Hip Flexion/Marching: AROM;AAROM;Right;Left;15 reps;Seated (AAROM on RLE)    General Comments        Pertinent Vitals/Pain Pain Assessment: 0-10 Pain Score: 7  Pain Location: back Pain Descriptors / Indicators: Aching Pain Intervention(s): Limited activity within patient's tolerance;Monitored during session;RN gave pain meds during session;Repositioned    Home Living  Prior Function            PT Goals (current goals can now be found in the care plan section) Progress towards PT goals: Progressing toward goals    Frequency    Min 3X/week      PT Plan Current plan remains appropriate    Co-evaluation              AM-PAC PT "6 Clicks" Mobility   Outcome Measure  Help needed turning from your back to your side while in a flat bed without using bedrails?: A Little Help needed moving from lying on your back to sitting on the side of a flat bed without using bedrails?: A Little Help needed moving to and from a bed to a chair (including a  wheelchair)?: A Little Help needed standing up from a chair using your arms (e.g., wheelchair or bedside chair)?: A Little Help needed to walk in hospital room?: A Little Help needed climbing 3-5 steps with a railing? : A Lot 6 Click Score: 17    End of Session Equipment Utilized During Treatment: Gait belt Activity Tolerance: Patient tolerated treatment well Patient left: in chair;with call bell/phone within reach;with nursing/sitter in room Nurse Communication: Mobility status PT Visit Diagnosis: Unsteadiness on feet (R26.81);Muscle weakness (generalized) (M62.81);Difficulty in walking, not elsewhere classified (R26.2);Other abnormalities of gait and mobility (R26.89)     Time: 8144-8185 PT Time Calculation (min) (ACUTE ONLY): 30 min  Charges:  $Gait Training: 8-22 mins $Therapeutic Exercise: 8-22 mins                     Shonita Rinck P, PT Acute Rehabilitation Services Pager: [phone] Office: [phone]    Debora Stockdale B Merari Pion 04/17/2020, 10:39 AM

## 2020-04-17 NOTE — Progress Notes (Addendum)
[facility] DAILY ICU PROGRESS NOTE                   [address]            [place] [address]          [phone]   8 Days Post-Op Procedure(s) (LRB): CORONARY ARTERY BYPASS GRAFTING (CABG) X 6 USING LIMA TO LAD; ENDOSCOPICALLY HARVESTED RIGHT GREATER SAPHENOUS VEIN: SVG to D1; SVG seq to OM2 and OM3; SVG seq to PD and PL. AORTIC VALVE REPLACEMENT USING INSPIRIS RESILIA 23 MM AORTIC VALVE. 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 LEFT ATRIAL APPENDAGE CLIPPING USING ATRICURE PRO240 40 MM CLIP. (N/A) AORTIC VALVE REPLACEMENT (AVR) USING EDWARDS INSPIRIS RESILIA 23 MM AORTIC VALVE. (N/A) MITRAL VALVE REPLACEMENT (MVR) USING MEMO 4D 28 MM MITRAL VALVE (N/A) TRANSESOPHAGEAL ECHOCARDIOGRAM (TEE) (N/A) ENDOVEIN HARVEST OF GREATER SAPHENOUS VEIN (Right) CLIPPING OF ATRIAL APPENDAGE USING  ATRICURE [id number] ATRICLIP (Left)  Total Length of Stay:  LOS: 20 days   Subjective: Up in the bedside chair eating breakfast. Says "he had the best day ever " yesterday.  No new concerns this morning except he is apologetic for his behavior in the ICU last weekend that prompted psych consult.   Chart reviewed. Case manager notes that SNF placement is on hold due to psych recommendation last weekend for inpatient psych treatment when ready to discharge from this surgical admission. Psych service was contacted yesterday to re-eval to determine if those recommendations are still approopriate before SNF placement can be accomplished.   Objective: Vital signs in last 24 hours: Temp:  [98.2 F (36.8 C)-98.6 F (37 C)] 98.2 F (36.8 C) (09/03 0439) Pulse Rate:  [66-90] 90 (09/03 0439) Cardiac Rhythm: Atrial fibrillation (09/02 1900) Resp:  [16-20] 20 (09/03 0439) BP: (104-125)/(46-65) 125/65 (09/03 0439) SpO2:  [93 %-95 %] 95 % (09/03 0439) Weight:  [66.5 kg] 66.5 kg (09/03 0439)  Filed Weights   04/15/20 0100 04/15/20 2321 04/17/20 0439  Weight: 70.5 kg 68.3 kg 66.5 kg    Weight  change: -1.757 kg     Intake/Output from previous day: 09/02 0701 - 09/03 0700 In: 240 [P.O.:240] Out: 2250 [Urine:2250]  Intake/Output this shift: No intake/output data recorded.  Current Meds: Scheduled Meds: . (feeding supplement) PROSource Plus  30 mL Oral BID BM  . atorvastatin  80 mg Oral Daily  . chlorhexidine  15 mL Mouth Rinse BID  . Chlorhexidine Gluconate Cloth  6 each Topical Daily  . feeding supplement (GLUCERNA SHAKE)  237 mL Oral BID BM  . ferrous fumarate-b12-vitamic C-folic acid  1 capsule Oral BID PC  . insulin aspart  0-24 Units Subcutaneous TID WC  . insulin detemir  10 Units Subcutaneous Daily  . potassium chloride  20 mEq Oral TID  . sodium chloride flush  3 mL Intravenous Q12H  . tamsulosin  0.4 mg Oral Daily  . torsemide  20 mg Oral BID  . traZODone  50 mg Oral QHS  . warfarin  5 mg Oral q1600  . Warfarin - Physician Dosing Inpatient   Does not apply q1600   Continuous Infusions: . sodium chloride     PRN Meds:.sodium chloride, acetaminophen, senna-docusate, sodium chloride flush  General appearance: alert, cooperative and no distress Neurologic: No new deficits, has chronic LE weakness due to spinal stenosis.  Heart: Atrial fibrillation, slow VR while sleeping but otherwise stable rate in 70's -80's.  Lungs: Breath sounds are clear, sats acceptable on  RA. Abdomen: soft, NT.  Extremities: mild LE edema is about the same today Wound: The sternal incision and RLE EVH incisions are dry. .  Lab Results: CBC: Recent Labs    04/15/20 0212  WBC 13.4*  HGB 8.7*  HCT 25.8*  PLT 277   BMET:  Recent Labs    04/15/20 0212 04/16/20 0214  NA 128* 130*  K 3.8 3.3*  CL 89* 86*  CO2 27 31  GLUCOSE 227* 105*  BUN 20 25*  CREATININE 0.70 0.76  CALCIUM 8.7* 9.0    CMET: Lab Results  Component Value Date   WBC 13.4 (H) 04/15/2020   HGB 8.7 (L) 04/15/2020   HCT 25.8 (L) 04/15/2020   PLT 277 04/15/2020   GLUCOSE 105 (H) 04/16/2020   ALT 19  04/09/2020   AST 17 04/09/2020   NA 130 (L) 04/16/2020   K 3.3 (L) 04/16/2020   CL 86 (L) 04/16/2020   CREATININE 0.76 04/16/2020   BUN 25 (H) 04/16/2020   CO2 31 04/16/2020   TSH 2.465 03/29/2020   INR 2.5 (H) 04/17/2020   HGBA1C 10.0 (H) 04/09/2020      PT/INR:  Recent Labs    04/17/20 0351  LABPROT 26.2*  INR 2.5*   Radiology: No results found.   Assessment/Plan: S/P Procedure(s) (LRB): CORONARY ARTERY BYPASS GRAFTING (CABG) X 6 USING LIMA TO LAD; ENDOSCOPICALLY HARVESTED RIGHT GREATER SAPHENOUS VEIN: SVG to D1; SVG seq to OM2 and OM3; SVG seq to PD and PL. AORTIC VALVE REPLACEMENT USING INSPIRIS RESILIA 23 MM AORTIC VALVE. 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 LEFT ATRIAL APPENDAGE CLIPPING USING ATRICURE PRO240 40 MM CLIP. (N/A) AORTIC VALVE REPLACEMENT (AVR) USING EDWARDS INSPIRIS RESILIA 23 MM AORTIC VALVE. (N/A) MITRAL VALVE REPLACEMENT (MVR) USING MEMO 4D 28 MM MITRAL VALVE (N/A) TRANSESOPHAGEAL ECHOCARDIOGRAM (TEE) (N/A) ENDOVEIN HARVEST OF GREATER SAPHENOUS VEIN (Right) CLIPPING OF ATRIAL APPENDAGE USING  ATRICURE [id number] ATRICLIP (Left)  --POD-8 CABG x 6, MV replacement with a 83mm bioprosthetic valve, MV annuloplasty, and clipping of left atrial appendage. BP stable. Overall progressing with mobility and he has been calm, cooperative and participating in his care for several days.   -Atrial fibrillation- has been having slow VR at night.  On no beta blocker or antiarrhythmics. Anticoagulation initiated with Coumadin, INR remains 1.4 -> 2.5 over past 24 hours. Will decrease Coumadin to 2mg  today, monitor.   -Hyponatremia- AM lab pending.  -Hypokalemia- supplementing with K-dur tid. Morning labs pending.   -Volume excess- net 2L diuresis yesterday  Wt.now below pre-op. Zaroxolyn discontinued.    -Expected acute blood loss anemia-  Hct trending up. On Fe++ supplement. Monitor.   -Chronic left leg weakness from spinal stenosis- Continue PT /  OT.  -History of major depression- Improved. He is cooperative and optimistic about his progress.  Continue trazodone.  -Disposition-  OT recommends discharge to SNF for further rehab. Care management team working SNF selection. Re-eval by Psych requested.    , PA-C [phone] 04/17/2020 7:37 AM   Chart reviewed, patient examined, agree with above. He is doing well from a medical standpoint. I don't think there is any need for inpt psych treatment. He does not want to go to a SNF and has 24 hr care at home with his son, step-son there and other people to help him. I think he will be much better mentally returning home with his family and safer all around. He can have HHRN to check  on him and HHPT.  His INR bumped up. Will probably need 2.5 mg Coumadin at discharge and outpt follow up.   Still has some lower extremity edema although at preop wt. He was edematous preop. He will need to stay on some diuretic at discharge and I think low dose Demedex is probably best. He did not respond to lasix.   Glucose is under reasonable control. He was not on any meds preop. Will start Metformin. He will need outpt followup of DM.

## 2020-04-17 NOTE — Progress Notes (Signed)
Mobility Specialist: Progress Note    04/17/20 1559  Mobility  Activity Ambulated in hall  Level of Assistance Contact guard assist, steadying assist  Assistive Device Front wheel walker  Distance Ambulated (ft) 240 ft  Mobility Response Tolerated fair  Mobility performed by Mobility specialist  Bed Position Chair  $Mobility charge 1 Mobility   Pre-Mobility: 90 HR, 111/60 BP, 95% SpO2 Post-Mobility: 104 HR, 128/62 BP, 96% SpO2  Pt stopped to take three standing rest breaks and one seated rest break during ambulation. The seated rest break occurred 140ft into ambulation which he said was due to feeling SOB and weak. Pt was discouraged he wasn't able to walk the entire way w/o sitting but said he is more determined to do so tomorrow.   [facility] Donielle Kaigler Mobility Specialist

## 2020-04-18 DIAGNOSIS — I2510 Atherosclerotic heart disease of native coronary artery without angina pectoris: Secondary | ICD-10-CM

## 2020-04-18 LAB — PROTIME-INR
INR: 2.9 — ABNORMAL HIGH (ref 0.8–1.2)
Prothrombin Time: 29.6 seconds — ABNORMAL HIGH (ref 11.4–15.2)

## 2020-04-18 LAB — BASIC METABOLIC PANEL
Anion gap: 11 (ref 5–15)
BUN: 20 mg/dL (ref 8–23)
CO2: 35 mmol/L — ABNORMAL HIGH (ref 22–32)
Calcium: 9 mg/dL (ref 8.9–10.3)
Chloride: 88 mmol/L — ABNORMAL LOW (ref 98–111)
Creatinine, Ser: 0.82 mg/dL (ref 0.61–1.24)
GFR calc Af Amer: >60 mL/min (ref >60)
GFR calc non Af Amer: >60 mL/min (ref >60)
Glucose, Bld: 192 mg/dL — ABNORMAL HIGH (ref 70–99)
Potassium: 3.4 mmol/L — ABNORMAL LOW (ref 3.5–5.1)
Sodium: 134 mmol/L — ABNORMAL LOW (ref 135–145)

## 2020-04-18 LAB — CBC
HCT: 26.8 % — ABNORMAL LOW (ref 39.0–52.0)
Hemoglobin: 9 g/dL — ABNORMAL LOW (ref 13.0–17.0)
MCH: 30.2 pg (ref 26.0–34.0)
MCHC: 33.6 g/dL (ref 30.0–36.0)
MCV: 89.9 fL (ref 80.0–100.0)
Platelets: 433 10*3/uL — ABNORMAL HIGH (ref 150–400)
RBC: 2.98 MIL/uL — ABNORMAL LOW (ref 4.22–5.81)
RDW: 14.8 % (ref 11.5–15.5)
WBC: 15.1 10*3/uL — ABNORMAL HIGH (ref 4.0–10.5)
nRBC: 0 % (ref 0.0–0.2)

## 2020-04-18 LAB — GLUCOSE, CAPILLARY
Glucose-Capillary: 108 mg/dL — ABNORMAL HIGH (ref 70–99)
Glucose-Capillary: 231 mg/dL — ABNORMAL HIGH (ref 70–99)

## 2020-04-18 MED ORDER — TAMSULOSIN HCL 0.4 MG PO CAPS: 0.4000 mg | ORAL_CAPSULE | Freq: Every day | ORAL | 0 refills | Status: DC

## 2020-04-18 MED ORDER — FE FUMARATE-B12-VIT C-FA-IFC PO CAPS
1.0000 | ORAL_CAPSULE | Freq: Two times a day (BID) | ORAL | 1 refills | Status: DC
Start: 2020-04-18 — End: 2020-04-18

## 2020-04-18 MED ORDER — ATORVASTATIN CALCIUM 80 MG PO TABS
80.0000 mg | ORAL_TABLET | Freq: Every day | ORAL | 1 refills | Status: DC
Start: 2020-04-18 — End: 2020-04-18

## 2020-04-18 MED ORDER — TORSEMIDE 20 MG PO TABS
20.0000 mg | ORAL_TABLET | Freq: Two times a day (BID) | ORAL | 1 refills | Status: DC
Start: 2020-04-18 — End: 2020-04-18

## 2020-04-18 MED ORDER — TORSEMIDE 20 MG PO TABS: 20.0000 mg | ORAL_TABLET | Freq: Two times a day (BID) | ORAL | 1 refills | Status: DC

## 2020-04-18 MED ORDER — ATORVASTATIN CALCIUM 80 MG PO TABS
80.0000 mg | ORAL_TABLET | Freq: Every day | ORAL | 1 refills | Status: DC
Start: 2020-04-18 — End: 2020-05-25

## 2020-04-18 MED ORDER — WARFARIN SODIUM 2.5 MG PO TABS
2.5000 mg | ORAL_TABLET | Freq: Every day | ORAL | 1 refills | Status: DC
Start: 2020-04-18 — End: 2020-04-18

## 2020-04-18 MED ORDER — METFORMIN HCL 500 MG PO TABS
500.0000 mg | ORAL_TABLET | Freq: Two times a day (BID) | ORAL | 1 refills | Status: DC
Start: 2020-04-18 — End: 2020-04-18

## 2020-04-18 MED ORDER — ACETAMINOPHEN 325 MG PO TABS: 650.0000 mg | ORAL_TABLET | Freq: Four times a day (QID) | ORAL | Status: DC | PRN

## 2020-04-18 MED ORDER — POTASSIUM CHLORIDE CRYS ER 20 MEQ PO TBCR
20.0000 meq | EXTENDED_RELEASE_TABLET | Freq: Two times a day (BID) | ORAL | 1 refills | Status: DC
Start: 2020-04-18 — End: 2020-04-18

## 2020-04-18 MED ORDER — ACETAMINOPHEN 325 MG PO TABS: 650.0000 mg | ORAL_TABLET | Freq: Four times a day (QID) | ORAL | Status: AC | PRN

## 2020-04-18 MED ORDER — WARFARIN SODIUM 2 MG PO TABS: 2.0000 mg | ORAL_TABLET | Freq: Every day | ORAL | 1 refills | Status: AC

## 2020-04-18 MED ORDER — FE FUMARATE-B12-VIT C-FA-IFC PO CAPS: 1.0000 | ORAL_CAPSULE | Freq: Two times a day (BID) | ORAL | 1 refills | Status: AC

## 2020-04-18 MED ORDER — POTASSIUM CHLORIDE CRYS ER 20 MEQ PO TBCR: 20.0000 meq | EXTENDED_RELEASE_TABLET | Freq: Two times a day (BID) | ORAL | 1 refills | Status: DC

## 2020-04-18 MED ORDER — METFORMIN HCL 500 MG PO TABS
500.0000 mg | ORAL_TABLET | Freq: Two times a day (BID) | ORAL | 1 refills | Status: DC
Start: 2020-04-18 — End: 2020-05-25

## 2020-04-18 NOTE — Plan of Care (Signed)
DISCHARGE NOTE HOME LEEAM CEDRONE to be discharged home per MD order. Discussed prescriptions and follow up appointments with the patient. Prescriptions given to patient; medication list explained in detail. Patient verbalized understanding.  Skin clean, dry and intact without evidence of skin break down, no evidence of skin tears noted. IV catheter discontinued intact. Site without signs and symptoms of complications. Dressing and pressure applied. Pt denies pain at the site currently. No complaints noted.  Patient free of lines, drains, and wounds.   An After Visit Summary (AVS) was printed and given to the patient. Patient escorted via wheelchair, and discharged home via private auto.  Arlice Colt, RN

## 2020-04-18 NOTE — Progress Notes (Signed)
Occupational Therapy Treatment Patient Details Name: Scott Rios MRN: [id number] DOB: Feb 21, 1956 Today's Date: 04/18/2020    History of present illness 64 yo admitted 8/13 with acute pulmonary edema, Severe AR and moderate MR. 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 PMHx: Dm, HTN, ETOH and tobacco abuse   OT comments  Pt demonstrates significant improvements.  He is able to state his sternal precautions and is able to perform ADLs with min guard assist.  He reports he will have 24 hour assist at discharge.   Follow Up Recommendations  Home health OT;Supervision/Assistance - 24 hour    Equipment Recommendations  3 in 1 bedside commode;Other (comment)    Recommendations for Other Services      Precautions / Restrictions Precautions Precautions: Sternal;Fall Precaution Comments: pt is able to state sternal precautions independently and demonstrates good understanding        Mobility Bed Mobility               General bed mobility comments: Pt sitting in chair   Transfers Overall transfer level: Needs assistance Equipment used: Rolling walker (2 wheeled) Transfers: Sit to/from Stand;Stand Pivot Transfers Sit to Stand: Min guard Stand pivot transfers: Min guard            Balance Overall balance assessment: Needs assistance Sitting-balance support: No upper extremity supported;Feet supported Sitting balance-Leahy Scale: Good     Standing balance support: Bilateral upper extremity supported;Single extremity supported Standing balance-Leahy Scale: Poor Standing balance comment: reliant on UE support for balance during standing                           ADL either performed or assessed with clinical judgement   ADL Overall ADL's : Needs assistance/impaired Eating/Feeding: Modified independent;Sitting   Grooming: Wash/dry hands;Wash/dry face;Brushing hair;Min guard;Standing       Lower Body Bathing: Min guard;Sit to/from stand       Lower  Body Dressing: Min guard;Sit to/from stand Lower Body Dressing Details (indicate cue type and reason): able to perform figure 4 Toilet Transfer: Min guard;Ambulation;Comfort height toilet;RW   Toileting- Architect and Hygiene: Min guard;Sit to/from stand       Functional mobility during ADLs: Min guard;Rolling walker General ADL Comments: min guard assist for safety and balance      Vision       Perception     Praxis      Cognition Arousal/Alertness: Awake/alert Behavior During Therapy: WFL for tasks assessed/performed Overall Cognitive Status: Within Functional Limits for tasks assessed                                 General Comments: WFL for basic info         Exercises     Shoulder Instructions       General Comments pt reports his son will be available to assist as needed and he will have 24 hour assist     Pertinent Vitals/ Pain       Pain Assessment: Faces Faces Pain Scale: Hurts a little bit Pain Location: chest and back  Pain Descriptors / Indicators: Aching Pain Intervention(s): Monitored during session  Home Living  Prior Functioning/Environment              Frequency  Min 2X/week        Progress Toward Goals  OT Goals(current goals can now be found in the care plan section)  Progress towards OT goals: Progressing toward goals     Plan Discharge plan needs to be updated    Co-evaluation                 AM-PAC OT "6 Clicks" Daily Activity     Outcome Measure   Help from another person eating meals?: None Help from another person taking care of personal grooming?: A Little Help from another person toileting, which includes using toliet, bedpan, or urinal?: A Little Help from another person bathing (including washing, rinsing, drying)?: A Little Help from another person to put on and taking off regular upper body clothing?: A Little Help  from another person to put on and taking off regular lower body clothing?: A Little 6 Click Score: 19    End of Session Equipment Utilized During Treatment: Gait belt;Rolling walker  OT Visit Diagnosis: Muscle weakness (generalized) (M62.81);Unsteadiness on feet (R26.81)   Activity Tolerance Patient tolerated treatment well   Patient Left in chair;with call bell/phone within reach;with nursing/sitter in room   Nurse Communication Mobility status        Time: 0240-9735 OT Time Calculation (min): 16 min  Charges: OT General Charges $OT Visit: 1 Visit OT Treatments $Self Care/Home Management : 8-22 mins  Eber Jones OTR/L Acute Rehabilitation Services Pager [phone] Office [phone]    Jeani Hawking M 04/18/2020, 1:01 PM

## 2020-04-18 NOTE — Discharge Instructions (Signed)
Blood Glucose Monitoring, Adult Monitoring your blood sugar (glucose) is an important part of managing your diabetes (diabetes mellitus). Blood glucose monitoring involves checking your blood glucose as often as directed and keeping a record (log) of your results over time. Checking your blood glucose regularly and keeping a blood glucose log can: Help you and your health care provider adjust your diabetes management plan as needed, including your medicines or insulin. Help you understand how food, exercise, illnesses, and medicines affect your blood glucose. Let you know what your blood glucose is at any time. You can quickly find out if you have low blood glucose (hypoglycemia) or high blood glucose (hyperglycemia). Your health care provider will set individualized treatment goals for you. Your goals will be based on your age, other medical conditions you have, and how you respond to diabetes treatment. Generally, the goal of treatment is to maintain the following blood glucose levels: Before meals (preprandial): 80-130 mg/dL (5.3-2.9 mmol/L). After meals (postprandial): below 180 mg/dL (10 mmol/L). A1c level: less than 7%. Supplies needed: Blood glucose meter. Test strips for your meter. Each meter has its own strips. You must use the strips that came with your meter. A needle to prick your finger (lancet). Do not use a lancet more than one time. A device that holds the lancet (lancing device). A journal or log book to write down your results. How to check your blood glucose  Wash your hands with soap and water. Prick the side of your finger (not the tip) with the lancet. Use a different finger each time. Gently rub the finger until a small drop of blood appears. Follow instructions that come with your meter for inserting the test strip, applying blood to the strip, and using your blood glucose meter. Write down your result and any notes. Some meters allow you to use areas of your body  other than your finger (alternative sites) to test your blood. The most common alternative sites are: Forearm. Thigh. Palm of the hand. If you think you may have hypoglycemia, or if you have a history of not knowing when your blood glucose is getting low (hypoglycemia unawareness), do not use alternative sites. Use your finger instead. Alternative sites may not be as accurate as the fingers, because blood flow is slower in these areas. This means that the result you get may be delayed, and it may be different from the result that you would get from your finger. Follow these instructions at home: Blood glucose log  Every time you check your blood glucose, write down your result. Also write down any notes about things that may be affecting your blood glucose, such as your diet and exercise for the day. This information can help you and your health care provider: Look for patterns in your blood glucose over time. Adjust your diabetes management plan as needed. Check if your meter allows you to download your records to a computer. Most glucose meters store a record of glucose readings in the meter. If you have type 1 diabetes: Check your blood glucose 2 or more times a day. Also check your blood glucose: Before every insulin injection. Before and after exercise. Before meals. 2 hours after a meal. Occasionally between 2:00 a.m. and 3:00 a.m., as directed. Before potentially dangerous tasks, like driving or using heavy machinery. At bedtime. You may need to check your blood glucose more often, up to 6-10 times a day, if you: Use an insulin pump. Need multiple daily injections (MDI). Have diabetes  that is not well-controlled. Are ill. Have a history of severe hypoglycemia. Have hypoglycemia unawareness. If you have type 2 diabetes: If you take insulin or other diabetes medicines, check your blood glucose 2 or more times a day. If you are on intensive insulin therapy, check your blood glucose  4 or more times a day. Occasionally, you may also need to check between 2:00 a.m. and 3:00 a.m., as directed. Also check your blood glucose: Before and after exercise. Before potentially dangerous tasks, like driving or using heavy machinery. You may need to check your blood glucose more often if: Your medicine is being adjusted. Your diabetes is not well-controlled. You are ill. General tips Always keep your supplies with you. If you have questions or need help, all blood glucose meters have a 24-hour "hotline" phone number that you can call. You may also contact your health care provider. After you use a few boxes of test strips, adjust (calibrate) your blood glucose meter by following instructions that came with your meter. Contact a health care provider if: Your blood glucose is at or above 240 mg/dL (44.9 mmol/L) for 2 days in a row. You have been sick or have had a fever for 2 days or longer, and you are not getting better. You have any of the following problems for more than 6 hours: You cannot eat or drink. You have nausea or vomiting. You have diarrhea. Get help right away if: Your blood glucose is lower than 54 mg/dL (3 mmol/L). You become confused or you have trouble thinking clearly. You have difficulty breathing. You have moderate or large ketone levels in your urine. Summary Monitoring your blood sugar (glucose) is an important part of managing your diabetes (diabetes mellitus). Blood glucose monitoring involves checking your blood glucose as often as directed and keeping a record (log) of your results over time. Your health care provider will set individualized treatment goals for you. Your goals will be based on your age, other medical conditions you have, and how you respond to diabetes treatment. Every time you check your blood glucose, write down your result. Also write down any notes about things that may be affecting your blood glucose, such as your diet and exercise  for the day. This information is not intended to replace advice given to you by your health care provider. Make sure you discuss any questions you have with your health care provider. Document Revised: 05/25/2018 Document Reviewed: 01/11/2016 Elsevier Patient Education  2020 [employer]. Discharge Instructions:  1. You may shower, please wash incisions daily with soap and water and keep dry.  If you wish to cover wounds with dressing you may do so but please keep clean and change daily.  No tub baths or swimming until incisions have completely healed.  If your incisions become red or develop any drainage please call our office at [phone]  2. No Driving until cleared by Dr. Sharee Pimple office and you are no longer using narcotic pain medications  3. Monitor your weight daily.. Please use the same scale and weigh at same time... If you gain 5-10 lbs in 48 hours with associated lower extremity swelling, please contact our office at [phone]  4. Fever of 101.5 for at least 24 hours with no source, please contact our office at [phone]  5. Activity- up as tolerated, please walk at least 3 times per day.  Avoid strenuous activity, no lifting, pushing, or pulling with your arms over 8-10 lbs for a minimum of 6 weeks  6. If any questions or concerns arise, please do not hesitate to contact our office at [phone]   Information on my medicine - Coumadin   (Warfarin)  Why was Coumadin prescribed for you? Coumadin was prescribed for you because you have a blood clot or a medical condition that can cause an increased risk of forming blood clots. Blood clots can cause serious health problems by blocking the flow of blood to the heart, lung, or brain. Coumadin can prevent harmful blood clots from forming. As a reminder your indication for Coumadin is:   Stroke Prevention Because Of Atrial Fibrillation  What test will check on my response to Coumadin? While on Coumadin (warfarin) you will  need to have an INR test regularly to ensure that your dose is keeping you in the desired range. The INR (international normalized ratio) number is calculated from the result of the laboratory test called prothrombin time (PT).  If an INR APPOINTMENT HAS NOT ALREADY BEEN MADE FOR YOU please schedule an appointment to have this lab work done by your health care provider within 7 days. Your INR goal is usually a number between:  2 to 3 or your provider may give you a more narrow range like 2-2.5.  Ask your health care provider during an office visit what your goal INR is.  What  do you need to  know  About  COUMADIN? Take Coumadin (warfarin) exactly as prescribed by your healthcare provider about the same time each day.  DO NOT stop taking without talking to the doctor who prescribed the medication.  Stopping without other blood clot prevention medication to take the place of Coumadin may increase your risk of developing a new clot or stroke.  Get refills before you run out.  What do you do if you miss a dose? If you miss a dose, take it as soon as you remember on the same day then continue your regularly scheduled regimen the next day.  Do not take two doses of Coumadin at the same time.  Important Safety Information A possible side effect of Coumadin (Warfarin) is an increased risk of bleeding. You should call your healthcare provider right away if you experience any of the following: ? Bleeding from an injury or your nose that does not stop. ? Unusual colored urine (red or dark brown) or unusual colored stools (red or black). ? Unusual bruising for unknown reasons. ? A serious fall or if you hit your head (even if there is no bleeding).  Some foods or medicines interact with Coumadin (warfarin) and might alter your response to warfarin. To help avoid this: ? Eat a balanced diet, maintaining a consistent amount of Vitamin K. ? Notify your provider about major diet changes you plan to  make. ? Avoid alcohol or limit your intake to 1 drink for women and 2 drinks for men per day. (1 drink is 5 oz. wine, 12 oz. beer, or 1.5 oz. liquor.)  Make sure that ANY health care provider who prescribes medication for you knows that you are taking Coumadin (warfarin).  Also make sure the healthcare provider who is monitoring your Coumadin knows when you have started a new medication including herbals and non-prescription products.  Coumadin (Warfarin)  Major Drug Interactions  Increased Warfarin Effect Decreased Warfarin Effect  Alcohol (large quantities) Antibiotics (esp. Septra/Bactrim, Flagyl, Cipro) Amiodarone (Cordarone) Aspirin (ASA) Cimetidine (Tagamet) Megestrol (Megace) NSAIDs (ibuprofen, naproxen, etc.) Piroxicam (Feldene) Propafenone (Rythmol SR) Propranolol (Inderal) Isoniazid (INH) Posaconazole (Noxafil) Barbiturates (Phenobarbital)  Carbamazepine (Tegretol) Chlordiazepoxide (Librium) Cholestyramine (Questran) Griseofulvin Oral Contraceptives Rifampin Sucralfate (Carafate) Vitamin K   Coumadin (Warfarin) Major Herbal Interactions  Increased Warfarin Effect Decreased Warfarin Effect  Garlic Ginseng Ginkgo biloba Coenzyme Q10 Green tea St. Johns wort    Coumadin (Warfarin) FOOD Interactions  Eat a consistent number of servings per week of foods HIGH in Vitamin K (1 serving =  cup)  Collards (cooked, or boiled & drained) Kale (cooked, or boiled & drained) Mustard greens (cooked, or boiled & drained) Parsley *serving size only =  cup Spinach (cooked, or boiled & drained) Swiss chard (cooked, or boiled & drained) Turnip greens (cooked, or boiled & drained)  Eat a consistent number of servings per week of foods MEDIUM-HIGH in Vitamin K (1 serving = 1 cup)  Asparagus (cooked, or boiled & drained) Broccoli (cooked, boiled & drained, or raw & chopped) Brussel sprouts (cooked, or boiled & drained) *serving size only =  cup Lettuce, raw (green leaf,  endive, romaine) Spinach, raw Turnip greens, raw & chopped   These websites have more information on Coumadin (warfarin):  http://www.king-russell.com/; https://www.hines.net/;    MOUTH CARE AFTER SURGERY  FACTS:  Ice used in ice bag helps keep the swelling down, and can help lessen the pain.  It is easier to treat pain BEFORE it happens.  Spitting disturbs the clot and may cause bleeding to start again, or to get worse.  Smoking delays healing and can cause complications.  Sharing prescriptions can be dangerous.  Do not take medications not recently prescribed for you.  Antibiotics may stop birth control pills from working.  Use other means of birth control while on antibiotics.  Warm salt water rinses after the first 24 hours will help lessen the swelling:  Use 1/2 teaspoonful of table salt per oz.of water.  DO NOT:  Do not spit.  Do not drink through a straw.  Strongly advised not to smoke, dip snuff or chew tobacco at least for 3 days.  Do not eat sharp or crunchy foods.  Avoid the area of surgery when chewing.  Do not stop your antibiotics before your instructions say to do so.  Do not eat hot foods until bleeding has stopped.  If you need to, let your food cool down to room temperature.  EXPECT:  Some swelling, especially first 2-3 days.  Soreness or discomfort in varying degrees.  Follow your dentist's instructions about how to handle pain before it starts.  Pinkish saliva or light blood in saliva, or on your pillow in the morning.  This can last around 24 hours.  Bruising inside or outside the mouth.  This may not show up until 2-3 days after surgery.  Don't worry, it will go away in time.  Pieces of "bone" may work themselves loose.  It's OK.  If they bother you, let us know.  WHAT TO DO IMMEDIATELY AFTER SURGERY:  Bite on the gauze with steady pressure for 1-2 hours.  Don't chew on the gauze.  Do not lie down flat.  Raise your head support especially  for the first 24 hours.  Apply ice to your face on the side of the surgery.  You may apply it 20 minutes on and a few minutes off.  Ice for 8-12 hours.  You may use ice up to 24 hours.  Before the numbness wears off, take a pain pill as instructed.  Prescription pain medication is not always required.  SWELLING:  Expect swelling for the first couple of  days.  It should get better after that.  If swelling increases 3 days or so after surgery; let us know as soon as possible.  FEVER:  Take Tylenol every 4 hours if needed to lower your temperature, especially if it is at 100F or higher.  Drink lots of fluids.  If the fever does not go away, let us know.  BREATHING TROUBLE:  Any unusual difficulty breathing means you have to have someone bring you to the emergency room ASAP  BLEEDING:  Light oozing is expected for 24 hours or so.  Prop head up with pillows  Avoid spitting  Do not confuse bright red fresh flowing blood with lots of saliva colored with a little bit of blood.  If you notice some bleeding, place gauze or a tea bag where it is bleeding and apply CONSTANT pressure by biting down for 1 hour.  Avoid talking during this time.  Do not remove the gauze or tea bag during this hour to "check" the bleeding.  If you notice bright RED bleeding FLOWING out of particular area, and filling the floor of your mouth, put a wad of gauze on that area, bite down firmly and constantly.  Call us immediately.  If we're closed, have someone bring you to the emergency room.  ORAL HYGIENE:  Brush your teeth as usual after meals and before bedtime.  Use a soft toothbrush around the area of surgery.  DO NOT AVOID BRUSHING.  Otherwise bacteria(germs) will grow and may delay healing or encourage infection.  Since you cannot spit, just gently rinse and let the water flow out of your mouth.  DO NOT SWISH HARD.  EATING:  Cool liquids are a good point to start.  Increase to soft foods as  tolerated.  PRESCRIPTIONS:  Follow the directions for your prescriptions exactly as written.  If Dr. Kristin BruinsKulinski gave you a narcotic pain medication, do not drive, operate machinery or drink alcohol when on that medication.  QUESTIONS:  Call our office during office hours [phone] or call the Emergency Room at [phone].

## 2020-04-18 NOTE — Progress Notes (Signed)
Discussed IS, sternal precautions, diet (DM, HH, low sodium), smoking cessation, exercise, daily wts, and CRPII. Pt voiced understanding, very receptive. He is quitting smoking and his son sts he is too (per pt). He sts he can get a scale. Will refer to [facility] CRPII. [id number] Ethelda Chick CES, ACSM 9:28 AM 04/18/2020

## 2020-04-18 NOTE — Progress Notes (Signed)
[address]       [place] [address]             [phone]      9 Days Post-Op Procedure(s) (LRB): CORONARY ARTERY BYPASS GRAFTING (CABG) X 6 USING [place]A TO LAD; ENDOSCOPICALLY HARVESTED RIGHT GREATER SAPHENOUS VEIN: SVG to D1; SVG seq to OM2 and OM3; SVG seq to PD and PL. AORTIC VALVE REPLACEMENT USING INSPIRIS RESILIA 23 MM AORTIC VALVE. 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 LEFT ATRIAL APPENDAGE CLIPPING USING ATRICURE PRO240 40 MM CLIP. (N/A) AORTIC VALVE REPLACEMENT (AVR) USING EDWARDS INSPIRIS RESILIA 23 MM AORTIC VALVE. (N/A) MITRAL VALVE REPLACEMENT (MVR) USING MEMO 4D 28 MM MITRAL VALVE (N/A) TRANSESOPHAGEAL ECHOCARDIOGRAM (TEE) (N/A) ENDOVEIN HARVEST OF GREATER SAPHENOUS VEIN (Right) CLIPPING OF ATRIAL APPENDAGE USING  ATRICURE [id number] ATRICLIP (Left) Subjective: Feels well, no new issues  Objective: Vital signs in last 24 hours: Temp:  [98.2 F (36.8 C)-98.5 F (36.9 C)] 98.3 F (36.8 C) (09/04 0445) Pulse Rate:  [89-94] 94 (09/04 0445) Cardiac Rhythm: Normal sinus rhythm (09/04 0523) Resp:  [16-19] 17 (09/04 0445) BP: (98-119)/(56-73) 119/73 (09/04 0445) SpO2:  [95 %-99 %] 98 % (09/04 0445) Weight:  [66.1 kg] 66.1 kg (09/04 0445)  Hemodynamic parameters for last 24 hours:    Intake/Output from previous day: 09/03 0701 - 09/04 0700 In: 1160 [P.O.:1160] Out: 1500 [Urine:1500] Intake/Output this shift: No intake/output data recorded.  General appearance: alert, cooperative and no distress Heart: regular rate and rhythm Lungs: min dim in bases Abdomen: benign Extremities: + BLE edema Wound: incis healing well  Lab Results: No results for input(s): WBC, HGB, HCT, PLT in the last 72 hours. BMET:  Recent Labs    04/16/20 0214 04/17/20 0351  NA 130* 134*  K 3.3* 3.6  CL 86* 89*  CO2 31 36*  GLUCOSE 105* 182*  BUN 25* 26*  CREATININE 0.76 0.89  CALCIUM 9.0 9.1    PT/INR:  Recent Labs    04/17/20 0351  LABPROT  26.2*  INR 2.5*   ABG    Component Value Date/Time   PHART 7.412 04/10/2020 0156   HCO3 22.0 04/10/2020 0156   TCO2 23 04/10/2020 0156   ACIDBASEDEF 2.0 04/10/2020 0156   O2SAT 99.0 04/10/2020 0156   CBG (last 3)  Recent Labs    04/17/20 1803 04/17/20 2116 04/18/20 0623  GLUCAP 150* 106* 108*    Meds Scheduled Meds: . (feeding supplement) PROSource Plus  30 mL Oral BID BM  . atorvastatin  80 mg Oral Daily  . chlorhexidine  15 mL Mouth Rinse BID  . Chlorhexidine Gluconate Cloth  6 each Topical Daily  . feeding supplement (GLUCERNA SHAKE)  237 mL Oral BID BM  . ferrous fumarate-b12-vitamic C-folic acid  1 capsule Oral BID PC  . insulin aspart  0-24 Units Subcutaneous TID WC  . insulin detemir  10 Units Subcutaneous Daily  . metFORMIN  500 mg Oral BID WC  . potassium chloride  20 mEq Oral TID  . sodium chloride flush  3 mL Intravenous Q12H  . tamsulosin  0.4 mg Oral Daily  . torsemide  20 mg Oral BID  . traZODone  50 mg Oral QHS  . warfarin  2.5 mg Oral q1600  . Warfarin - Physician Dosing Inpatient   Does not apply q1600   Continuous Infusions: . sodium chloride     PRN Meds:.sodium chloride, acetaminophen, senna-docusate, sodium chloride flush  Xrays No  results found.  Assessment/Plan: S/P Procedure(s) (LRB): CORONARY ARTERY BYPASS GRAFTING (CABG) X 6 USING LIMA TO LAD; ENDOSCOPICALLY HARVESTED RIGHT GREATER SAPHENOUS VEIN: SVG to D1; SVG seq to OM2 and OM3; SVG seq to PD and PL. AORTIC VALVE REPLACEMENT USING INSPIRIS RESILIA 23 MM AORTIC VALVE. 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 LEFT ATRIAL APPENDAGE CLIPPING USING ATRICURE PRO240 40 MM CLIP. (N/A) AORTIC VALVE REPLACEMENT (AVR) USING EDWARDS INSPIRIS RESILIA 23 MM AORTIC VALVE. (N/A) MITRAL VALVE REPLACEMENT (MVR) USING MEMO 4D 28 MM MITRAL VALVE (N/A) TRANSESOPHAGEAL ECHOCARDIOGRAM (TEE) (N/A) ENDOVEIN HARVEST OF GREATER SAPHENOUS VEIN (Right) CLIPPING OF ATRIAL APPENDAGE USING  ATRICURE  [id number] ATRICLIP (Left)  1 afeb, VSS, BP a bit low at times 2 sats good on RA 3 sinus rhythm, occas missed beat 4 cont demadex for volume overload 5 no new labs today, INR to be checked next weds in coumadin clinic plan for 2.5 mg 6  metformin at d/c for sugars- will need f/u as outpt 7 d/c today  LOS: 21 days    Rowe Clack PA-C Pager [phone] 04/18/2020

## 2020-04-21 LAB — ECHO INTRAOPERATIVE TEE
Height: 67 in
Weight: 2288 oz

## 2020-04-22 ENCOUNTER — Other Ambulatory Visit: Payer: Self-pay

## 2020-04-22 ENCOUNTER — Ambulatory Visit (INDEPENDENT_AMBULATORY_CARE_PROVIDER_SITE_OTHER): Payer: Medicare Other | Admitting: Pharmacist

## 2020-04-22 DIAGNOSIS — I351 Nonrheumatic aortic (valve) insufficiency: Secondary | ICD-10-CM

## 2020-04-22 DIAGNOSIS — Z953 Presence of xenogenic heart valve: Secondary | ICD-10-CM

## 2020-04-22 DIAGNOSIS — Z951 Presence of aortocoronary bypass graft: Secondary | ICD-10-CM

## 2020-04-22 DIAGNOSIS — I340 Nonrheumatic mitral (valve) insufficiency: Secondary | ICD-10-CM

## 2020-04-22 DIAGNOSIS — Z952 Presence of prosthetic heart valve: Secondary | ICD-10-CM | POA: Diagnosis not present

## 2020-04-22 DIAGNOSIS — I4891 Unspecified atrial fibrillation: Secondary | ICD-10-CM

## 2020-04-22 DIAGNOSIS — Z7901 Long term (current) use of anticoagulants: Secondary | ICD-10-CM | POA: Diagnosis not present

## 2020-04-22 LAB — POCT INR: INR: 1.3 — AB (ref 2.0–3.0)

## 2020-04-22 NOTE — Patient Instructions (Signed)
Description   Take 2 tablets by mouth (4 mg total) tonight and tomorrow and then continue 1 tablet every night.  Recheck in 1 week.  Please call with any questions [phone].

## 2020-04-23 ENCOUNTER — Encounter (INDEPENDENT_AMBULATORY_CARE_PROVIDER_SITE_OTHER): Payer: Self-pay

## 2020-04-23 DIAGNOSIS — Z4802 Encounter for removal of sutures: Secondary | ICD-10-CM

## 2020-04-24 ENCOUNTER — Encounter: Payer: Self-pay | Admitting: Family Medicine

## 2020-04-24 ENCOUNTER — Ambulatory Visit (INDEPENDENT_AMBULATORY_CARE_PROVIDER_SITE_OTHER): Payer: Medicare Other | Admitting: Family Medicine

## 2020-04-24 ENCOUNTER — Other Ambulatory Visit: Payer: Self-pay

## 2020-04-24 VITALS — BP 138/80 | HR 96 | Ht 67.0 in | Wt 147.7 lb

## 2020-04-24 DIAGNOSIS — I351 Nonrheumatic aortic (valve) insufficiency: Secondary | ICD-10-CM

## 2020-04-24 DIAGNOSIS — Z48812 Encounter for surgical aftercare following surgery on the circulatory system: Secondary | ICD-10-CM | POA: Diagnosis not present

## 2020-04-24 DIAGNOSIS — I2583 Coronary atherosclerosis due to lipid rich plaque: Secondary | ICD-10-CM

## 2020-04-24 DIAGNOSIS — I340 Nonrheumatic mitral (valve) insufficiency: Secondary | ICD-10-CM

## 2020-04-24 DIAGNOSIS — Z951 Presence of aortocoronary bypass graft: Secondary | ICD-10-CM | POA: Diagnosis not present

## 2020-04-24 DIAGNOSIS — I2510 Atherosclerotic heart disease of native coronary artery without angina pectoris: Secondary | ICD-10-CM | POA: Diagnosis not present

## 2020-04-24 DIAGNOSIS — I5043 Acute on chronic combined systolic (congestive) and diastolic (congestive) heart failure: Secondary | ICD-10-CM | POA: Diagnosis not present

## 2020-04-24 MED ORDER — METOPROLOL SUCCINATE ER 25 MG PO TB24: 12.5000 mg | ORAL_TABLET | Freq: Every day | ORAL | 6 refills | Status: DC

## 2020-04-24 NOTE — Progress Notes (Signed)
Cardiology Office Note  Date: 04/24/2020   ID: [name], DOB 1956-03-19, MRN [id number]  PCP:  Scott Nevins, MD  Cardiologist:  No primary care provider on file. Electrophysiologist:  None   Chief Complaint: Hospital follow-up for right leg weakness, flash pulmonary edema, acute respiratory failure, tobacco abuse, past history of alcohol abuse.  History of Present Illness: Scott Rios is a 64 y.o. male with a history of DM 2, HTN, ongoing heavy smoking and COPD, previous EtOH abuse.  Other history includes multivessel coronary artery disease, severe aortic stenosis with insufficiency, mitral valve insufficiency, status post CABG x6, status post aortic valve replacement, status post mitral valve annuloplasty, acute pulmonary edema, acute systolic heart failure, history of depression, postoperative atrial fibrillation, hyperglycemia.  Presented on March 27, 2020 with complaints of right leg weakness and numbness.  This had started the previous Friday.  Stated he tried to get up from his chair and was not able to stand on right leg due to weakness and numbness.  Presented to emergency room. Work-up showed hyponatremia, hyperglycemia, magnesium of 1.4, elevated troponins, 27-121, this x-ray finding suspicious for pulmonary edema.  CT of the chest no evidence of aortic dissection.  Mild ectasia of the ascending aorta at 3.7 cm.  Moderate bilateral pleural effusions associated with bibasilar atelectasis and some patchy groundglass opacification of mid upper lungs suggesting possible infection or inflammation.  Became acutely short of breath after CT was started on IV Lasix and placed on BiPAP.  Echocardiogram showed EF of 35 to 40% with regional wall motion abnormalities and grade 2 diastolic dysfunction.  Moderate hypokinesis of LV, mid apical, inferior septal, and inferior walls, moderate mitral regurgitation, severe aortic insufficiency with possible bicuspid aortic valve.  Mild aortic  stenosis.  Ascending aorta measured at 38 mm.  Cardiac catheterization showed severe multivessel disease.  LAD was occluded midportion with filling beyond that by collaterals from a large diagonal branch with 75% stenosis.  RCA had 80% mid vessel stenosis and diffusely diseased.  90% PDA stenosis.  Mitral valve had annular dilatation with at least mild/moderate central MR.  Aortic valve.  Functionally bicuspid with severe AI  On 04/09/2020 underwent 6 vessel coronary bypass along with mitral valve annuloplasty over the 28 mm Sorin memo for the annuloplasty ring, aortic valve replacement with a 23 mm express resilient pericardial tissue valve, clipping of the left atrial appendage.  He developed Intra-Op atrial flutter with slow ventricular response.    He was anticoagulated with Coumadin.  His INR at discharge of 2.9.  No beta-blockers were started or antiarrhythmics due to slow ventricular rate.  He is here today for hospital follow-up status post 6 vessel bypass, aortic valve replacement, mitral valve repair, clipping of left atrial appendage.  He denies any anginal or exertional symptoms.  No palpitations or arrhythmias, orthostatic symptoms, PND or orthopnea, CVA or TIA-like symptoms, bleeding in stool or urine.  Median sternotomy scar is clean and dry wound is well approximated.  Chest tube insertion site and vein harvesting site on right lower extremity clean and dry with no redness, swelling or drainage.  Denies any claudication-like symptoms, DVT or PE-like symptoms.  He does have some mild lower extremity edema.  He states the swelling in his lower extremities is much better than before.  He states he stopped smoking.  His son who is with him stated he tried to smoke 1 cigarette but could not tolerate.   Past Medical History:  Diagnosis Date  .  Diabetes mellitus without complication (HCC)   . Hypertension     Past Surgical History:  Procedure Laterality Date  . AORTIC VALVE REPLACEMENT N/A  04/09/2020   Procedure: AORTIC VALVE REPLACEMENT (AVR) USING EDWARDS INSPIRIS RESILIA 23 MM AORTIC VALVE.;  Surgeon: Alleen Borne, MD;  Location: [facility] OR;  Service: Open Heart Surgery;  Laterality: N/A;  . CHOLECYSTECTOMY    . CLIPPING OF ATRIAL APPENDAGE Left 04/09/2020   Procedure: CLIPPING OF ATRIAL APPENDAGE USING  ATRICURE [id number] ATRICLIP;  Surgeon: Alleen Borne, MD;  Location: [facility] OR;  Service: Open Heart Surgery;  Laterality: Left;  . CORONARY ARTERY BYPASS GRAFT N/A 04/09/2020   Procedure: CORONARY ARTERY BYPASS GRAFTING (CABG) X 6 USING LIMA TO LAD; ENDOSCOPICALLY HARVESTED RIGHT GREATER SAPHENOUS VEIN: SVG to D1; SVG seq to OM2 and OM3; SVG seq to PD and PL. AORTIC VALVE REPLACEMENT USING INSPIRIS RESILIA 23 MM AORTIC VALVE. 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 LEFT ATRIAL APPENDAGE CLIPPING USING ATRICURE PRO240 40 MM CLIP.;  Surgeon: Alleen Borne, MD;   . ENDOVEIN HARVEST OF GREATER SAPHENOUS VEIN Right 04/09/2020   Procedure: ENDOVEIN HARVEST OF GREATER SAPHENOUS VEIN;  Surgeon: Alleen Borne, MD;  Location: [facility] OR;  Service: Open Heart Surgery;  Laterality: Right;  . HERNIA REPAIR    . MITRAL VALVE REPAIR N/A 04/09/2020   Procedure: MITRAL VALVE REPLACEMENT (MVR) USING MEMO 4D 28 MM MITRAL VALVE;  Surgeon: Alleen Borne, MD;  Location: [facility] OR;  Service: Open Heart Surgery;  Laterality: N/A;  . MULTIPLE EXTRACTIONS WITH ALVEOLOPLASTY N/A 04/03/2020   Procedure: Extraction of tooth #'s 11,22,23,26,27,28,29 with alveoloplasty and bilateral mandibular tori reductions;  Surgeon: Charlynne Pander, DDS;  Location: [facility] OR;  Service: Oral Surgery;  Laterality: N/A;  . RIGHT/LEFT HEART CATH AND CORONARY ANGIOGRAPHY N/A 03/30/2020   Procedure: RIGHT/LEFT HEART CATH AND CORONARY ANGIOGRAPHY;  Surgeon: Corky Crafts, MD;  Location: [facility] INVASIVE CV LAB;  Service: Cardiovascular;  Laterality: N/A;  . TEE WITHOUT CARDIOVERSION N/A 03/31/2020   Procedure: TRANSESOPHAGEAL  ECHOCARDIOGRAM (TEE);  Surgeon: Jodelle Red, MD;  Location: [facility] ENDOSCOPY;  Service: Cardiovascular;  Laterality: N/A;  . TEE WITHOUT CARDIOVERSION N/A 04/09/2020   Procedure: TRANSESOPHAGEAL ECHOCARDIOGRAM (TEE);  Surgeon: Alleen Borne, MD;  Location: [facility] OR;  Service: Open Heart Surgery;  Laterality: N/A;    Current Outpatient Medications  Medication Sig Dispense Refill  . acetaminophen (TYLENOL) 325 MG tablet Take 2 tablets (650 mg total) by mouth every 6 (six) hours as needed for mild pain or fever.    Marland Kitchen atorvastatin (LIPITOR) 80 MG tablet Take 1 tablet (80 mg total) by mouth daily. 30 tablet 1  . ferrous fumarate-b12-vitamic C-folic acid (TRINSICON / FOLTRIN) capsule Take 1 capsule by mouth 2 (two) times daily after a meal. 60 capsule 1  . metFORMIN (GLUCOPHAGE) 500 MG tablet Take 1 tablet (500 mg total) by mouth 2 (two) times daily with a meal. 60 tablet 1  . potassium chloride SA (KLOR-CON) 20 MEQ tablet Take 1 tablet (20 mEq total) by mouth 2 (two) times daily. 60 tablet 1  . tamsulosin (FLOMAX) 0.4 MG CAPS capsule Take 1 capsule (0.4 mg total) by mouth daily. 30 capsule 0  . torsemide (DEMADEX) 20 MG tablet Take 1 tablet (20 mg total) by mouth 2 (two) times daily. 60 tablet 1  . warfarin (COUMADIN) 2 MG tablet Take 1 tablet (2 mg total) by mouth daily at 4 PM. As directed by the coumadin  clinic 100 tablet 1   No current facility-administered medications for this visit.   Allergies:  Tramadol and Penicillins   Social History: The patient  reports that he quit smoking about 3 weeks ago. His smoking use included cigarettes. He smoked 1.00 pack per day. He has never used smokeless tobacco. He reports that he does not drink alcohol and does not use drugs.   Family History: The patient's family history includes CAD in his father.   ROS:  Please see the history of present illness. Otherwise, complete review of systems is positive for none.  All other systems are reviewed and  negative.   Physical Exam: VS:  BP 138/80   Pulse 96   Ht 5\' 7"  (1.702 m)   Wt 147 lb 11.2 oz (67 kg)   SpO2 99%   BMI 23.13 kg/m , BMI Body mass index is 23.13 kg/m.  Wt Readings from Last 3 Encounters:  04/24/20 147 lb 11.2 oz (67 kg)  04/18/20 145 lb 11.6 oz (66.1 kg)  06/15/17 168 lb (76.2 kg)    General: Patient appears comfortable at rest. Neck: Supple, no elevated JVP or carotid bruits, no thyromegaly. Lungs: Clear to auscultation, nonlabored breathing at rest. Cardiac: Regular rate and rhythm, no S3 or significant systolic murmur, no pericardial rub. Extremities: Mild pitting edema, distal pulses 2+. Skin: Warm and dry.  Median sternotomy scar and chest tube insertion sites as well as right leg vein harvesting site incisions clean and dry with no signs of infection. Musculoskeletal: No kyphosis. Neuropsychiatric: Alert and oriented x3, affect grossly appropriate.  ECG:    Recent Labwork: 03/29/2020: TSH 2.465 04/09/2020: ALT 19; AST 17 04/10/2020: Magnesium 2.3 04/18/2020: BUN 20; Creatinine, Ser 0.82; Hemoglobin 9.0; Platelets 433; Potassium 3.4; Sodium 134  No results found for: CHOL, TRIG, HDL, CHOLHDL, VLDL, LDLCALC, LDLDIRECT  Other Studies Reviewed Today:   Echocardiogram 03/28/2020  1. Left ventricular ejection fraction, by estimation, is 35 to 40%. The  left ventricle has moderately decreased function. The left ventricle  demonstrates regional wall motion abnormalities (see scoring  diagram/findings for description). The left  ventricular internal cavity size was mildly dilated. There is mild  concentric left ventricular hypertrophy. Left ventricular diastolic  parameters are consistent with Grade II diastolic dysfunction  (pseudonormalization). There is moderate hypokinesis of  the left ventricular, mid-apical inferoseptal wall and inferior wall.  There is severe hypokinesis of the left ventricular, mid-apical  anteroseptal wall.  2. Right ventricular  systolic function is normal. The right ventricular  size is normal.  3. Left atrial size was severely dilated.  4. Right atrial size was mildly dilated.  5. Likely moderate eccentric MR.. The mitral valve is abnormal. Moderate  mitral valve regurgitation.  6. Fixed right coronary cusp with prominent focal calcification. Suspect  bicuspid valve, but obscured by significant calcification. Severe aortic  regurgitation, best seen on apical images, with mitral valve anterior  leaflet restriction secondary to jet.  Vena contracta 0.65 cm on 5 chamber apical, 0.80 cm on 3 chamber apical,  fills most of LVOT and projects into majority of LV cavity (see image 83).  Some diastolic flow reversal seen in descending aorta but not well  visualized.. The aortic valve has an  indeterminant number of cusps. Aortic valve regurgitation is severe. Mild  aortic valve stenosis.  7. Aortic dilatation noted. There is borderline dilatation of the  ascending aorta measuring 38 mm.  8. The inferior vena cava is dilated in size with <50% respiratory  variability, suggesting right atrial pressure of 15 mmHg.   Comparison(s): No prior Echocardiogram.   03/30/2020  RIGHT/LEFT HEART CATH AND CORONARY ANGIOGRAPHY  Conclusion    Prox RCA to Mid RCA lesion is 80% stenosed.  RPDA lesion is 90% stenosed.  1st Mrg lesion is 100% stenosed.  3rd Mrg lesion is 75% stenosed.  Prox Cx to Mid Cx lesion is 75% stenosed.  Mid LAD lesion is 100% stenosed.  Mid LAD to Dist LAD lesion is 70% stenosed.  2nd Diag lesion is 40% stenosed.  Prox LAD to Mid LAD lesion is 25% stenosed.  LV end diastolic pressure is normal.  There is moderate (3+) aortic regurgitation.  There is mild left ventricular systolic dysfunction.  The left ventricular ejection fraction is 35-45% by visual estimate.  Right heart results on room air: Ao sat 90%, PA sat 57%, PA pressure 28/12, mean PA 18 mm Hg; mean PCWP 17 mm Hg; CO  4.1 L/min; CI 2.33  Severe three vessel disease. Moderate to severe aortic insufficiency.  Plan for TEE tomorrow.   Will need cardiac surgery consult for CABG and likely AVR.  Results conveyed to his son, Scott Rios. Diagnostic Dominance: Right    Assessment and Plan:  1. H/O six vessel coronary artery bypass graft   2. Coronary artery disease due to lipid rich plaque   3. Acute on chronic combined systolic (congestive) and diastolic (congestive) heart failure (HCC)   4. Severe aortic regurgitation   5. Nonrheumatic mitral valve regurgitation    1. H/O six vessel coronary artery bypass graft Apr 09, 2020 coronary artery bypass grafting x 6 Left internal mammary artery graft to the LAD, SVG to diagonal ,Sequential SVG to OM1 and OM3, Sequential SVG to PDA and PL. median sternotomy scar clean and dry as well as chest tube insertion sites clean and dry with no redness, swelling or drainage.  Right leg vein harvesting site clean and dry.  Patient has scheduled follow-up with Dr. Laneta Simmers September 2019 2021.  Patient states he is feeling much better since surgery.  2. Coronary artery disease due to lipid rich plaque Cardiac catheterization Mar 30, 2020: Proximal to mid RCA 80%, RPDA 90%, 1st marginal 100%, 3rd marginal 75%, proximal mid circumflex 75%, mid LAD 100%, mid to distal LAD 25%, moderate 3+ aortic regurgitation. LVEF 35 to 40%.  Patient denies any current anginal or exertional symptoms.  Start Toprol-XL 12.5 mg daily.  3. Acute on chronic combined systolic (congestive) and diastolic (congestive) heart failure (HCC) Echo Mar 28, 2020: LVEF 35 to 40%, positive WMA's, mild concentric LVH, G2 DD. Moderate hypokinesis of left ventricular, mid apical inferior septal wall and inferior wall. Severe hypokinesis of left ventricular, mid apical anteroseptal wall, LA severely dilated, RA mildly dilated, moderate eccentric MR, severe aortic regurgitation.  Denies any weight gain, shortness of  breath.  He does continue with some lower extremity edema.  He states edema is much better is much better.  Start Toprol-XL 12.5 mg.  Hopefully heart rate and blood pressure will tolerate.  Continue torsemide 20 mg p.o. twice daily.  Get a follow-up CBC in BMP.  Patient has scheduled follow-up echocardiogram for Wednesday June 03, 2020 post surgery.  4. Severe aortic regurgitation Status post Aortic valve replacement using a 23 mm Edwards INSPIRIS RESILIA pericardial valve.  5.  Moderate to severe mitral valve regurgitation. Status post mitral valve repair with 28 mm Sorin, Memo 4D annuloplasty ring, clipping of left atrial appendage.   6.  Atrial  fibrillation Denies any recent palpitations, racing heart, or fluttering sensation in his heart.  Heart rate is 96 and pulse is regular today.  Continue Coumadin 2 mg daily as directed by Coumadin clinic.  Start Toprol XL 12.5 mg daily.  7.  Smoker/COPD Patient was a very heavy smoker prior to surgery.  Son who is with him states she attempted to smoke a cigarette a few days ago and could not tolerate it.  Advised to continue with cessation  Medication Adjustments/Labs and Tests Ordered: Current medicines are reviewed at length with the patient today.  Concerns regarding medicines are outlined above.   Disposition: Follow-up with Dr. Wyline Mood or APP 3 months  Signed, Rennis Harding, NP 04/24/2020 2:19 PM    [facility] at [facility] [address] [place], [place], [place] [address] Phone: [phone]; Fax: [phone]

## 2020-04-24 NOTE — Patient Instructions (Signed)
Medication Instructions:   Begin Toprol XL 12.5mg  daily.   Continue all other medications.    Labwork:  BMET, CBC - orders given today.   Office will contact with results via phone or letter.    Testing/Procedures: none  Follow-Up: 3 months   Any Other Special Instructions Will Be Listed Below (If Applicable).  If you need a refill on your cardiac medications before your next appointment, please call your pharmacy.

## 2020-04-30 ENCOUNTER — Other Ambulatory Visit: Payer: Self-pay

## 2020-04-30 ENCOUNTER — Other Ambulatory Visit (HOSPITAL_COMMUNITY)
Admission: RE | Admit: 2020-04-30 | Discharge: 2020-04-30 | Disposition: A | Discharge status: Home / Self Care | Payer: Medicare Other | Source: Ambulatory Visit | Attending: Family Medicine | Admitting: Family Medicine

## 2020-04-30 DIAGNOSIS — I5043 Acute on chronic combined systolic (congestive) and diastolic (congestive) heart failure: Secondary | ICD-10-CM | POA: Diagnosis present

## 2020-04-30 DIAGNOSIS — Z951 Presence of aortocoronary bypass graft: Secondary | ICD-10-CM | POA: Insufficient documentation

## 2020-04-30 LAB — BASIC METABOLIC PANEL
Anion gap: 10 (ref 5–15)
BUN: 15 mg/dL (ref 8–23)
CO2: 26 mmol/L (ref 22–32)
Calcium: 9.2 mg/dL (ref 8.9–10.3)
Chloride: 90 mmol/L — ABNORMAL LOW (ref 98–111)
Creatinine, Ser: 0.67 mg/dL (ref 0.61–1.24)
GFR calc Af Amer: >60 mL/min (ref >60)
GFR calc non Af Amer: >60 mL/min (ref >60)
Glucose, Bld: 394 mg/dL — ABNORMAL HIGH (ref 70–99)
Potassium: 4.4 mmol/L (ref 3.5–5.1)
Sodium: 126 mmol/L — ABNORMAL LOW (ref 135–145)

## 2020-04-30 LAB — CBC
HCT: 31.8 % — ABNORMAL LOW (ref 39.0–52.0)
Hemoglobin: 10.1 g/dL — ABNORMAL LOW (ref 13.0–17.0)
MCH: 28.4 pg (ref 26.0–34.0)
MCHC: 31.8 g/dL (ref 30.0–36.0)
MCV: 89.3 fL (ref 80.0–100.0)
Platelets: 428 10*3/uL — ABNORMAL HIGH (ref 150–400)
RBC: 3.56 MIL/uL — ABNORMAL LOW (ref 4.22–5.81)
RDW: 14.2 % (ref 11.5–15.5)
WBC: 10.8 10*3/uL — ABNORMAL HIGH (ref 4.0–10.5)
nRBC: 0 % (ref 0.0–0.2)

## 2020-05-01 ENCOUNTER — Ambulatory Visit (INDEPENDENT_AMBULATORY_CARE_PROVIDER_SITE_OTHER): Payer: Medicare Other | Admitting: *Deleted

## 2020-05-01 DIAGNOSIS — Z5181 Encounter for therapeutic drug level monitoring: Secondary | ICD-10-CM

## 2020-05-01 DIAGNOSIS — I4891 Unspecified atrial fibrillation: Secondary | ICD-10-CM | POA: Diagnosis not present

## 2020-05-01 DIAGNOSIS — Z952 Presence of prosthetic heart valve: Secondary | ICD-10-CM | POA: Diagnosis not present

## 2020-05-01 LAB — POCT INR: INR: 1.4 — AB (ref 2.0–3.0)

## 2020-05-01 NOTE — Patient Instructions (Signed)
Take 2 tablets by mouth (4 mg total) tonight then increase dose to 1 tablet daily except 1 1/2 tablets on Mondays, Wednesdays and Fridays.  Recheck in 1 week.  Please call with any questions [phone].

## 2020-05-04 ENCOUNTER — Telehealth: Payer: Self-pay | Admitting: *Deleted

## 2020-05-04 NOTE — Telephone Encounter (Signed)
-----   Message from Netta Neat., NP sent at 05/03/2020  4:10 PM EDT ----- Regarding: Covid Vaccine I don't see an reason why he couldn't get it. It beats the heck out of getting the virus.  ----- Message ----- From: Louanna Raw, RN Sent: 05/01/2020  12:06 PM EDT To: Netta Neat., NP  Pt wants to know if he can get COVID vaccine yet.  Just had CABG 3 weeks ago and has not been vaccinated.  Is trying to get set up with PCP and they are requiring pt's be vaccinated.

## 2020-05-04 NOTE — Progress Notes (Signed)
9/20 - LMTCB

## 2020-05-04 NOTE — Telephone Encounter (Signed)
Pt called and informed OK to take Covid Vaccine.

## 2020-05-06 ENCOUNTER — Other Ambulatory Visit: Payer: Self-pay | Admitting: Cardiology

## 2020-05-06 DIAGNOSIS — I059 Rheumatic mitral valve disease, unspecified: Secondary | ICD-10-CM

## 2020-05-06 DIAGNOSIS — I359 Nonrheumatic aortic valve disorder, unspecified: Secondary | ICD-10-CM

## 2020-05-07 ENCOUNTER — Ambulatory Visit (INDEPENDENT_AMBULATORY_CARE_PROVIDER_SITE_OTHER): Payer: Medicare Other | Admitting: *Deleted

## 2020-05-07 DIAGNOSIS — I4891 Unspecified atrial fibrillation: Secondary | ICD-10-CM

## 2020-05-07 DIAGNOSIS — Z952 Presence of prosthetic heart valve: Secondary | ICD-10-CM

## 2020-05-07 LAB — POCT INR: INR: 2.2 (ref 2.0–3.0)

## 2020-05-07 NOTE — Patient Instructions (Signed)
Continue warfarin 1 tablet daily except 1 1/2 tablets on Mondays, Wednesdays and Fridays.  Recheck in 1 week.  Please call with any questions [phone].

## 2020-05-08 ENCOUNTER — Telehealth: Payer: Self-pay | Admitting: *Deleted

## 2020-05-08 DIAGNOSIS — E871 Hypo-osmolality and hyponatremia: Secondary | ICD-10-CM

## 2020-05-08 DIAGNOSIS — R739 Hyperglycemia, unspecified: Secondary | ICD-10-CM

## 2020-05-08 NOTE — Telephone Encounter (Signed)
-----   Message from Netta Neat., NP sent at 05/01/2020  7:56 AM EDT ----- Please call the patient and let him know his lab work showed his sodium was a little low but it appears it has been chronically low.  His sodium was 126.  His glucose was 394.  His hemoglobin was 10.1 and hematocrit was 31.8.  He needs to get better control over his sugar that is probably helping to cause osmotic diuresis and loss of sodium along with the torsemide.  Tell him to decrease his torsemide dose to once a day instead of twice a day for the next week.  Then he can return to twice a day.  We will need to get a follow-up BMP and magnesium to check to see if his sodium has gotten better in 2 weeks.  Thank you

## 2020-05-08 NOTE — Telephone Encounter (Signed)
Lesle Chris, LPN  02/13/6377 58:85 AM EDT Back to Top    Son [name]) notified. Will mail lab order to home - he will do at [facility] in 2 weeks.    Lesle Chris, LPN  0/27/7412 8:78 PM EDT     Left message to return call.

## 2020-05-12 ENCOUNTER — Other Ambulatory Visit: Payer: Self-pay | Admitting: Surgery

## 2020-05-12 DIAGNOSIS — Z951 Presence of aortocoronary bypass graft: Secondary | ICD-10-CM

## 2020-05-13 ENCOUNTER — Ambulatory Visit
Admission: RE | Admit: 2020-05-13 | Discharge: 2020-05-13 | Disposition: A | Discharge status: Home / Self Care | Payer: Medicare Other | Source: Ambulatory Visit | Attending: Surgery | Admitting: Surgery

## 2020-05-13 ENCOUNTER — Encounter: Payer: Self-pay | Admitting: Surgery

## 2020-05-13 ENCOUNTER — Ambulatory Visit (INDEPENDENT_AMBULATORY_CARE_PROVIDER_SITE_OTHER): Payer: Self-pay | Admitting: Surgery

## 2020-05-13 ENCOUNTER — Other Ambulatory Visit: Payer: Self-pay

## 2020-05-13 VITALS — BP 119/72 | HR 96 | Temp 98.2°F | Resp 20 | Ht 67.0 in | Wt 133.0 lb

## 2020-05-13 DIAGNOSIS — I2510 Atherosclerotic heart disease of native coronary artery without angina pectoris: Secondary | ICD-10-CM

## 2020-05-13 DIAGNOSIS — Z951 Presence of aortocoronary bypass graft: Secondary | ICD-10-CM

## 2020-05-13 DIAGNOSIS — I350 Nonrheumatic aortic (valve) stenosis: Secondary | ICD-10-CM

## 2020-05-13 DIAGNOSIS — I340 Nonrheumatic mitral (valve) insufficiency: Secondary | ICD-10-CM

## 2020-05-13 NOTE — Progress Notes (Signed)
HPI: Patient returns for routine postoperative follow-up having undergone coronary artery bypass graft surgery x6, mitral valve repair with an annuloplasty ring, aortic valve replacement with a 23 mm Edwards pericardial valve and clipping of left atrial appendage on 04/09/2020. The patient's early postoperative recovery while in the hospital was notable for an uncomplicated postoperative course. Since hospital discharge the patient reports that he has been steadily improving.  He is walking with a walker for balance due to preoperative weakness in the right lower extremity related to his spinal stenosis.  His stamina has been improving.  He denies any chest pain or shortness of breath.  He had some edema in his lower legs when he first went home but said that has almost resolved.   Current Outpatient Medications  Medication Sig Dispense Refill   acetaminophen (TYLENOL) 325 MG tablet Take 2 tablets (650 mg total) by mouth every 6 (six) hours as needed for mild pain or fever.     atorvastatin (LIPITOR) 80 MG tablet Take 1 tablet (80 mg total) by mouth daily. 30 tablet 1   ferrous fumarate-b12-vitamic C-folic acid (TRINSICON / FOLTRIN) capsule Take 1 capsule by mouth 2 (two) times daily after a meal. 60 capsule 1   metFORMIN (GLUCOPHAGE) 500 MG tablet Take 1 tablet (500 mg total) by mouth 2 (two) times daily with a meal. 60 tablet 1   metoprolol succinate (TOPROL XL) 25 MG 24 hr tablet Take 0.5 tablets (12.5 mg total) by mouth daily. 30 tablet 6   potassium chloride SA (KLOR-CON) 20 MEQ tablet Take 1 tablet (20 mEq total) by mouth 2 (two) times daily. 60 tablet 1   tamsulosin (FLOMAX) 0.4 MG CAPS capsule Take 1 capsule (0.4 mg total) by mouth daily. 30 capsule 0   torsemide (DEMADEX) 20 MG tablet Take 1 tablet (20 mg total) by mouth 2 (two) times daily. 60 tablet 1   warfarin (COUMADIN) 2 MG tablet Take 1 tablet (2 mg total) by mouth daily at 4 PM. As directed by the coumadin clinic 100  tablet 1   No current facility-administered medications for this visit.    Physical Exam: BP 119/72    Pulse 96    Temp 98.2 F (36.8 C) (Skin)    Resp 20    Ht 5\' 7"  (1.702 m)    Wt 133 lb (60.3 kg)    SpO2 98% Comment: RA   BMI 20.83 kg/m  He looks well. Cardiac exam shows a regular rate and rhythm with normal heart sounds.  There is no murmur. Lungs are clear. The chest incision is healing well and the sternum is stable. His leg incisions are healing well and there is minimal edema in the ankles and feet.  Diagnostic Tests:  CLINICAL DATA:  Status post coronary bypass graft.  EXAM: CHEST - 2 VIEW  COMPARISON:  April 13, 2020.  FINDINGS: The heart size and mediastinal contours are within normal limits. Both lungs are clear. Status post cardiac valve repair. No pneumothorax or pleural effusion is noted. The visualized skeletal structures are unremarkable.  IMPRESSION: No active cardiopulmonary disease.   Electronically Signed   By: April 15, 2020 M.D.   On: 05/13/2020 10:58  Impression:  Overall Mr. Bart is making a very good recovery following his surgery especially considering his preoperative condition and left ventricular systolic dysfunction.  I encouraged him to continue ambulating as much as possible.  He was sent home on Coumadin due to recurrent atrial fibrillation postoperatively.  His  rhythm feels very regular today.  If he maintains sinus rhythm he may be able to get off Coumadin in the future.  He should remain on that for at least 3 months due to mitral valve repair.  After 3 months he could be converted to aspirin alone at the discretion of cardiology.  I told him he could return to driving a car when he feels comfortable with that but should refrain lifting anything heavier than 10 pounds for 3 months postoperatively.  Plan:  He will continue to follow-up with cardiology and will return to see me if he has any problems with his  incisions.   Alleen Borne, MD [facility] [phone]

## 2020-05-15 ENCOUNTER — Ambulatory Visit (INDEPENDENT_AMBULATORY_CARE_PROVIDER_SITE_OTHER): Payer: Medicare Other | Admitting: *Deleted

## 2020-05-15 DIAGNOSIS — Z952 Presence of prosthetic heart valve: Secondary | ICD-10-CM

## 2020-05-15 DIAGNOSIS — Z953 Presence of xenogenic heart valve: Secondary | ICD-10-CM | POA: Diagnosis not present

## 2020-05-15 DIAGNOSIS — Z5181 Encounter for therapeutic drug level monitoring: Secondary | ICD-10-CM | POA: Diagnosis not present

## 2020-05-15 DIAGNOSIS — Z7901 Long term (current) use of anticoagulants: Secondary | ICD-10-CM

## 2020-05-15 DIAGNOSIS — I4891 Unspecified atrial fibrillation: Secondary | ICD-10-CM

## 2020-05-15 LAB — POCT INR: INR: 3.2 — AB (ref 2.0–3.0)

## 2020-05-15 NOTE — Patient Instructions (Signed)
Decrease warfarin to 1 tablet daily except 1 1/2 tablets on Wednesdays.  Recheck in 2 weeks.  Please call with any questions [phone].

## 2020-05-19 ENCOUNTER — Other Ambulatory Visit: Payer: Self-pay | Admitting: Surgical

## 2020-05-22 ENCOUNTER — Telehealth: Payer: Self-pay | Admitting: *Deleted

## 2020-05-22 NOTE — Telephone Encounter (Signed)
Pt's son called stating his father had a temp of 105 then corrected himself and said 100.5 with uncontrollable shakes. Son stated he called his fathers warfarin doctor after contacting our office and was advised to take Scott Rios to the emergency department in which they were doing. He was told to contact our office is anything further arose.

## 2020-05-22 NOTE — Telephone Encounter (Signed)
Pt son pt has been shaking with temp of 105.2 - son aware that pt needs ED evaluation - son will take him

## 2020-05-25 ENCOUNTER — Other Ambulatory Visit: Payer: Self-pay

## 2020-05-25 MED ORDER — TAMSULOSIN HCL 0.4 MG PO CAPS: 0.4000 mg | ORAL_CAPSULE | Freq: Every day | ORAL | 2 refills | Status: AC

## 2020-05-25 MED ORDER — POTASSIUM CHLORIDE CRYS ER 20 MEQ PO TBCR: 20.0000 meq | EXTENDED_RELEASE_TABLET | Freq: Two times a day (BID) | ORAL | 1 refills | Status: AC

## 2020-05-25 MED ORDER — ATORVASTATIN CALCIUM 80 MG PO TABS: 80.0000 mg | ORAL_TABLET | Freq: Every day | ORAL | 2 refills | Status: AC

## 2020-05-25 MED ORDER — TORSEMIDE 20 MG PO TABS: 20.0000 mg | ORAL_TABLET | Freq: Two times a day (BID) | ORAL | 1 refills | Status: AC

## 2020-05-25 MED ORDER — METFORMIN HCL 500 MG PO TABS: 500.0000 mg | ORAL_TABLET | Freq: Two times a day (BID) | ORAL | 1 refills | Status: AC

## 2020-05-25 MED ORDER — METOPROLOL SUCCINATE ER 25 MG PO TB24: 12.5000 mg | ORAL_TABLET | Freq: Every day | ORAL | 6 refills | Status: AC

## 2020-05-25 NOTE — Progress Notes (Signed)
Amy, PT with [facility] [phone] called with concerns about patient, medication refills, and VO for home health physical therapy.  Verbal orders given for home health physical therapy for initial certification.  Will await faxed copy for physician signature.   Amy also stated that patient is having trouble getting into see his PCP.  She stated taht he had not been in to see his PCP for over a year and that he was classified as a new patient, but his PCP does not have any available appointments.  She stated that he was on the wait-list for an appointment there.  She also stated that patient had non-surgical wounds that looked like rug burn on his bilateral legs.  She asked for care from Dr. Laneta Simmers in regards to wounds.  Per Dr. Laneta Simmers patient should be see in the walk-in wound clinic in [place] for wound care.  She acknowledged receipt.  Per Dr. Laneta Simmers patient's medications can be refilled until patient is seen by his PCP.  She acknowledged receipt.

## 2020-05-27 ENCOUNTER — Other Ambulatory Visit: Payer: Self-pay

## 2020-05-27 ENCOUNTER — Telehealth: Payer: Self-pay | Admitting: Cardiology

## 2020-05-27 ENCOUNTER — Other Ambulatory Visit (HOSPITAL_COMMUNITY)
Admission: RE | Admit: 2020-05-27 | Discharge: 2020-05-27 | Disposition: A | Discharge status: Home / Self Care | Payer: Medicare Other | Source: Ambulatory Visit | Attending: Cardiology | Admitting: Cardiology

## 2020-05-27 ENCOUNTER — Ambulatory Visit (INDEPENDENT_AMBULATORY_CARE_PROVIDER_SITE_OTHER): Payer: Medicare Other | Admitting: *Deleted

## 2020-05-27 DIAGNOSIS — Z5181 Encounter for therapeutic drug level monitoring: Secondary | ICD-10-CM | POA: Diagnosis not present

## 2020-05-27 DIAGNOSIS — Z952 Presence of prosthetic heart valve: Secondary | ICD-10-CM | POA: Insufficient documentation

## 2020-05-27 LAB — POCT INR: INR: 7.8 — AB (ref 2.0–3.0)

## 2020-05-27 LAB — PROTIME-INR
INR: 5.2 (ref 0.8–1.2)
Prothrombin Time: 46.1 seconds — ABNORMAL HIGH (ref 11.4–15.2)

## 2020-05-27 NOTE — Telephone Encounter (Signed)
Received call from [facility] lab with pt INR 5.2. This was a repeat, office lab was 7.8. He was already instructed to hold his coumadin pending result this evening. I have advised that he continue to do the same. No bleeding reported. Will route to [place] RN/MD for follow up in the morning regarding dosing.

## 2020-05-28 NOTE — Patient Instructions (Signed)
POC INR 7.8   Sent to [facility] for STAT INR  5.2 Awaiting results Told pt and son to HOLD warfarin tonight and tomorrow night then decrease dose to 1 tablet daily except 1/2 tablet on Tuesdays and Fridays Recheck INR on Monday 06/01/20 Bleeding and fall precautions discussed with pt and son and they verbalized understanding.

## 2020-06-03 ENCOUNTER — Ambulatory Visit (INDEPENDENT_AMBULATORY_CARE_PROVIDER_SITE_OTHER): Payer: Medicare Other

## 2020-06-03 DIAGNOSIS — I359 Nonrheumatic aortic valve disorder, unspecified: Secondary | ICD-10-CM

## 2020-06-03 DIAGNOSIS — I059 Rheumatic mitral valve disease, unspecified: Secondary | ICD-10-CM | POA: Diagnosis not present

## 2020-06-05 LAB — ECHOCARDIOGRAM COMPLETE
AR max vel: 1.6 cm2
AV Area VTI: 1.92 cm2
AV Area mean vel: 1.7 cm2
AV Mean grad: 14 mmHg
AV Peak grad: 24.9 mmHg
Ao pk vel: 2.5 m/s
Area-P 1/2: 3.16 cm2
Calc EF: 42.2 %
MV M vel: 1.33 m/s
MV Peak grad: 7.1 mmHg
P 1/2 time: 984 msec
S' Lateral: 3.5 cm
Single Plane A2C EF: 32.1 %
Single Plane A4C EF: 45.1 %

## 2020-06-11 ENCOUNTER — Emergency Department (HOSPITAL_COMMUNITY): Payer: Medicare Other

## 2020-06-11 ENCOUNTER — Inpatient Hospital Stay (HOSPITAL_COMMUNITY)
Admission: EM | Admit: 2020-06-11 | Discharge: 2020-07-15 | DRG: 064 | Disposition: E | Payer: Medicare Other | Attending: Internal Medicine | Admitting: Internal Medicine

## 2020-06-11 ENCOUNTER — Encounter (HOSPITAL_COMMUNITY): Payer: Self-pay

## 2020-06-11 ENCOUNTER — Other Ambulatory Visit: Payer: Self-pay

## 2020-06-11 DIAGNOSIS — F32A Depression, unspecified: Secondary | ICD-10-CM | POA: Diagnosis present

## 2020-06-11 DIAGNOSIS — E1165 Type 2 diabetes mellitus with hyperglycemia: Secondary | ICD-10-CM | POA: Diagnosis present

## 2020-06-11 DIAGNOSIS — I4891 Unspecified atrial fibrillation: Secondary | ICD-10-CM | POA: Diagnosis present

## 2020-06-11 DIAGNOSIS — B952 Enterococcus as the cause of diseases classified elsewhere: Secondary | ICD-10-CM | POA: Diagnosis not present

## 2020-06-11 DIAGNOSIS — J810 Acute pulmonary edema: Secondary | ICD-10-CM | POA: Diagnosis present

## 2020-06-11 DIAGNOSIS — I444 Left anterior fascicular block: Secondary | ICD-10-CM | POA: Diagnosis present

## 2020-06-11 DIAGNOSIS — I5043 Acute on chronic combined systolic (congestive) and diastolic (congestive) heart failure: Secondary | ICD-10-CM | POA: Diagnosis present

## 2020-06-11 DIAGNOSIS — A4181 Sepsis due to Enterococcus: Secondary | ICD-10-CM | POA: Diagnosis present

## 2020-06-11 DIAGNOSIS — I6340 Cerebral infarction due to embolism of unspecified cerebral artery: Secondary | ICD-10-CM | POA: Diagnosis present

## 2020-06-11 DIAGNOSIS — I76 Septic arterial embolism: Secondary | ICD-10-CM | POA: Diagnosis present

## 2020-06-11 DIAGNOSIS — E871 Hypo-osmolality and hyponatremia: Secondary | ICD-10-CM | POA: Diagnosis present

## 2020-06-11 DIAGNOSIS — Z953 Presence of xenogenic heart valve: Secondary | ICD-10-CM | POA: Diagnosis not present

## 2020-06-11 DIAGNOSIS — I110 Hypertensive heart disease with heart failure: Secondary | ICD-10-CM | POA: Diagnosis present

## 2020-06-11 DIAGNOSIS — I4892 Unspecified atrial flutter: Secondary | ICD-10-CM | POA: Diagnosis present

## 2020-06-11 DIAGNOSIS — I482 Chronic atrial fibrillation, unspecified: Secondary | ICD-10-CM | POA: Diagnosis present

## 2020-06-11 DIAGNOSIS — I639 Cerebral infarction, unspecified: Secondary | ICD-10-CM | POA: Diagnosis present

## 2020-06-11 DIAGNOSIS — Z720 Tobacco use: Secondary | ICD-10-CM | POA: Diagnosis present

## 2020-06-11 DIAGNOSIS — F17210 Nicotine dependence, cigarettes, uncomplicated: Secondary | ICD-10-CM | POA: Diagnosis present

## 2020-06-11 DIAGNOSIS — I6389 Other cerebral infarction: Secondary | ICD-10-CM | POA: Diagnosis not present

## 2020-06-11 DIAGNOSIS — Z952 Presence of prosthetic heart valve: Secondary | ICD-10-CM

## 2020-06-11 DIAGNOSIS — E785 Hyperlipidemia, unspecified: Secondary | ICD-10-CM | POA: Diagnosis present

## 2020-06-11 DIAGNOSIS — Z9049 Acquired absence of other specified parts of digestive tract: Secondary | ICD-10-CM | POA: Diagnosis not present

## 2020-06-11 DIAGNOSIS — R7881 Bacteremia: Secondary | ICD-10-CM | POA: Diagnosis not present

## 2020-06-11 DIAGNOSIS — L89152 Pressure ulcer of sacral region, stage 2: Secondary | ICD-10-CM | POA: Diagnosis present

## 2020-06-11 DIAGNOSIS — I2583 Coronary atherosclerosis due to lipid rich plaque: Secondary | ICD-10-CM | POA: Diagnosis not present

## 2020-06-11 DIAGNOSIS — R0902 Hypoxemia: Secondary | ICD-10-CM | POA: Diagnosis not present

## 2020-06-11 DIAGNOSIS — Z8249 Family history of ischemic heart disease and other diseases of the circulatory system: Secondary | ICD-10-CM

## 2020-06-11 DIAGNOSIS — Z7901 Long term (current) use of anticoagulants: Secondary | ICD-10-CM

## 2020-06-11 DIAGNOSIS — Z66 Do not resuscitate: Secondary | ICD-10-CM | POA: Diagnosis present

## 2020-06-11 DIAGNOSIS — G9341 Metabolic encephalopathy: Secondary | ICD-10-CM | POA: Diagnosis present

## 2020-06-11 DIAGNOSIS — R4182 Altered mental status, unspecified: Secondary | ICD-10-CM | POA: Diagnosis not present

## 2020-06-11 DIAGNOSIS — L89322 Pressure ulcer of left buttock, stage 2: Secondary | ICD-10-CM | POA: Diagnosis present

## 2020-06-11 DIAGNOSIS — I340 Nonrheumatic mitral (valve) insufficiency: Secondary | ICD-10-CM | POA: Diagnosis present

## 2020-06-11 DIAGNOSIS — Z794 Long term (current) use of insulin: Secondary | ICD-10-CM | POA: Diagnosis not present

## 2020-06-11 DIAGNOSIS — I480 Paroxysmal atrial fibrillation: Secondary | ICD-10-CM | POA: Diagnosis not present

## 2020-06-11 DIAGNOSIS — Z681 Body mass index (BMI) 19 or less, adult: Secondary | ICD-10-CM

## 2020-06-11 DIAGNOSIS — N390 Urinary tract infection, site not specified: Secondary | ICD-10-CM | POA: Diagnosis present

## 2020-06-11 DIAGNOSIS — I330 Acute and subacute infective endocarditis: Secondary | ICD-10-CM | POA: Diagnosis present

## 2020-06-11 DIAGNOSIS — Z951 Presence of aortocoronary bypass graft: Secondary | ICD-10-CM | POA: Diagnosis not present

## 2020-06-11 DIAGNOSIS — Z20822 Contact with and (suspected) exposure to covid-19: Secondary | ICD-10-CM | POA: Diagnosis present

## 2020-06-11 DIAGNOSIS — E876 Hypokalemia: Secondary | ICD-10-CM | POA: Diagnosis present

## 2020-06-11 DIAGNOSIS — R109 Unspecified abdominal pain: Secondary | ICD-10-CM

## 2020-06-11 DIAGNOSIS — J449 Chronic obstructive pulmonary disease, unspecified: Secondary | ICD-10-CM | POA: Diagnosis present

## 2020-06-11 DIAGNOSIS — I2510 Atherosclerotic heart disease of native coronary artery without angina pectoris: Secondary | ICD-10-CM | POA: Diagnosis present

## 2020-06-11 DIAGNOSIS — M4800 Spinal stenosis, site unspecified: Secondary | ICD-10-CM | POA: Diagnosis present

## 2020-06-11 DIAGNOSIS — J9601 Acute respiratory failure with hypoxia: Secondary | ICD-10-CM | POA: Diagnosis present

## 2020-06-11 DIAGNOSIS — Z880 Allergy status to penicillin: Secondary | ICD-10-CM

## 2020-06-11 DIAGNOSIS — Z79899 Other long term (current) drug therapy: Secondary | ICD-10-CM

## 2020-06-11 DIAGNOSIS — E440 Moderate protein-calorie malnutrition: Secondary | ICD-10-CM | POA: Diagnosis present

## 2020-06-11 DIAGNOSIS — F1011 Alcohol abuse, in remission: Secondary | ICD-10-CM | POA: Diagnosis present

## 2020-06-11 DIAGNOSIS — Q7649 Other congenital malformations of spine, not associated with scoliosis: Secondary | ICD-10-CM

## 2020-06-11 LAB — RESPIRATORY PANEL BY RT PCR (FLU A&B, COVID)
Influenza A by PCR: NEGATIVE
Influenza B by PCR: NEGATIVE
SARS Coronavirus 2 by RT PCR: NEGATIVE

## 2020-06-11 LAB — CBC WITH DIFFERENTIAL/PLATELET
Abs Immature Granulocytes: 0.23 10*3/uL — ABNORMAL HIGH (ref 0.00–0.07)
Basophils Absolute: 0.1 10*3/uL (ref 0.0–0.1)
Basophils Relative: 0 %
Eosinophils Absolute: 0 10*3/uL (ref 0.0–0.5)
Eosinophils Relative: 0 %
HCT: 32.4 % — ABNORMAL LOW (ref 39.0–52.0)
Hemoglobin: 10.5 g/dL — ABNORMAL LOW (ref 13.0–17.0)
Immature Granulocytes: 1 %
Lymphocytes Relative: 3 %
Lymphs Abs: 0.6 10*3/uL — ABNORMAL LOW (ref 0.7–4.0)
MCH: 26.9 pg (ref 26.0–34.0)
MCHC: 32.4 g/dL (ref 30.0–36.0)
MCV: 82.9 fL (ref 80.0–100.0)
Monocytes Absolute: 0.6 10*3/uL (ref 0.1–1.0)
Monocytes Relative: 3 %
Neutro Abs: 20.2 10*3/uL — ABNORMAL HIGH (ref 1.7–7.7)
Neutrophils Relative %: 93 %
Platelets: 191 10*3/uL (ref 150–400)
RBC: 3.91 MIL/uL — ABNORMAL LOW (ref 4.22–5.81)
RDW: 15 % (ref 11.5–15.5)
WBC: 21.7 10*3/uL — ABNORMAL HIGH (ref 4.0–10.5)
nRBC: 0 % (ref 0.0–0.2)

## 2020-06-11 LAB — COMPREHENSIVE METABOLIC PANEL
ALT: 23 U/L (ref 0–44)
AST: 18 U/L (ref 15–41)
Albumin: 2.4 g/dL — ABNORMAL LOW (ref 3.5–5.0)
Alkaline Phosphatase: 139 U/L — ABNORMAL HIGH (ref 38–126)
Anion gap: 8 (ref 5–15)
BUN: 12 mg/dL (ref 8–23)
CO2: 30 mmol/L (ref 22–32)
Calcium: 8.4 mg/dL — ABNORMAL LOW (ref 8.9–10.3)
Chloride: 90 mmol/L — ABNORMAL LOW (ref 98–111)
Creatinine, Ser: 0.51 mg/dL — ABNORMAL LOW (ref 0.61–1.24)
GFR, Estimated: >60 mL/min (ref >60)
Glucose, Bld: 425 mg/dL — ABNORMAL HIGH (ref 70–99)
Potassium: 3.8 mmol/L (ref 3.5–5.1)
Sodium: 128 mmol/L — ABNORMAL LOW (ref 135–145)
Total Bilirubin: 0.9 mg/dL (ref 0.3–1.2)
Total Protein: 6 g/dL — ABNORMAL LOW (ref 6.5–8.1)

## 2020-06-11 LAB — LACTIC ACID, PLASMA
Lactic Acid, Venous: 1.6 mmol/L (ref 0.5–1.9)
Lactic Acid, Venous: 1.5 mmol/L (ref 0.5–1.9)

## 2020-06-11 LAB — BETA-HYDROXYBUTYRIC ACID: Beta-Hydroxybutyric Acid: 0.22 mmol/L (ref 0.05–0.27)

## 2020-06-11 LAB — URINALYSIS, ROUTINE W REFLEX MICROSCOPIC
Bilirubin Urine: NEGATIVE
Glucose, UA: >=500 mg/dL — AB
Ketones, ur: NEGATIVE mg/dL
Leukocytes,Ua: NEGATIVE
Nitrite: NEGATIVE
Protein, ur: 30 mg/dL — AB
Specific Gravity, Urine: 1.023 (ref 1.005–1.030)
pH: 7 (ref 5.0–8.0)

## 2020-06-11 LAB — CBG MONITORING, ED
Glucose-Capillary: 239 mg/dL — ABNORMAL HIGH (ref 70–99)
Glucose-Capillary: 368 mg/dL — ABNORMAL HIGH (ref 70–99)
Glucose-Capillary: 50 mg/dL — ABNORMAL LOW (ref 70–99)

## 2020-06-11 LAB — RAPID URINE DRUG SCREEN, HOSP PERFORMED
Amphetamines: NOT DETECTED
Barbiturates: NOT DETECTED
Benzodiazepines: NOT DETECTED
Cocaine: NOT DETECTED
Opiates: NOT DETECTED
Tetrahydrocannabinol: NOT DETECTED

## 2020-06-11 LAB — BRAIN NATRIURETIC PEPTIDE: B Natriuretic Peptide: 382 pg/mL — ABNORMAL HIGH (ref 0.0–100.0)

## 2020-06-11 LAB — PROTIME-INR
INR: 1.9 — ABNORMAL HIGH (ref 0.8–1.2)
Prothrombin Time: 20.8 seconds — ABNORMAL HIGH (ref 11.4–15.2)

## 2020-06-11 LAB — TROPONIN I (HIGH SENSITIVITY)
Troponin I (High Sensitivity): 42 ng/L — ABNORMAL HIGH (ref <18)
Troponin I (High Sensitivity): 38 ng/L — ABNORMAL HIGH (ref <18)

## 2020-06-11 LAB — OSMOLALITY: Osmolality: 293 mOsm/kg (ref 275–295)

## 2020-06-11 LAB — MAGNESIUM: Magnesium: 1.8 mg/dL (ref 1.7–2.4)

## 2020-06-11 MED ORDER — STROKE: EARLY STAGES OF RECOVERY BOOK
Freq: Once | Status: AC
  Filled 2020-06-11: qty 1

## 2020-06-11 MED ORDER — VANCOMYCIN HCL IN DEXTROSE 1-5 GM/200ML-% IV SOLN: 1000.0000 mg | Freq: Once | INTRAVENOUS | Status: DC

## 2020-06-11 MED ORDER — WARFARIN - PHARMACIST DOSING INPATIENT: Freq: Every day | Status: DC

## 2020-06-11 MED ORDER — DEXTROSE 50 % IV SOLN
0.0000 mL | INTRAVENOUS | Status: DC | PRN
  Administered 2020-06-11: 50 mL via INTRAVENOUS
  Filled 2020-06-11: qty 50

## 2020-06-11 MED ORDER — LACTATED RINGERS IV SOLN: INTRAVENOUS | Status: DC

## 2020-06-11 MED ORDER — FE FUMARATE-B12-VIT C-FA-IFC PO CAPS
1.0000 | ORAL_CAPSULE | Freq: Two times a day (BID) | ORAL | Status: DC
  Filled 2020-06-11 (×7): qty 1

## 2020-06-11 MED ORDER — ACETAMINOPHEN 650 MG RE SUPP
650.0000 mg | RECTAL | Status: DC | PRN
  Administered 2020-06-12 – 2020-06-16 (×2): 650 mg via RECTAL
  Filled 2020-06-11 (×2): qty 1

## 2020-06-11 MED ORDER — METRONIDAZOLE IN NACL 5-0.79 MG/ML-% IV SOLN
500.0000 mg | Freq: Once | INTRAVENOUS | Status: AC
  Administered 2020-06-11: 500 mg via INTRAVENOUS
  Filled 2020-06-11: qty 100

## 2020-06-11 MED ORDER — DIGOXIN 0.25 MG/ML IJ SOLN
0.1250 mg | Freq: Four times a day (QID) | INTRAMUSCULAR | Status: AC
  Administered 2020-06-11 – 2020-06-12 (×2): 0.125 mg via INTRAVENOUS
  Filled 2020-06-11 (×2): qty 2

## 2020-06-11 MED ORDER — INSULIN REGULAR(HUMAN) IN NACL 100-0.9 UT/100ML-% IV SOLN: INTRAVENOUS | Status: DC

## 2020-06-11 MED ORDER — LACTATED RINGERS IV BOLUS
500.0000 mL | Freq: Once | INTRAVENOUS | Status: AC
  Administered 2020-06-11: 500 mL via INTRAVENOUS

## 2020-06-11 MED ORDER — INSULIN DETEMIR 100 UNIT/ML ~~LOC~~ SOLN
5.0000 [IU] | Freq: Every day | SUBCUTANEOUS | Status: DC
  Administered 2020-06-11 – 2020-06-13 (×2): 5 [IU] via SUBCUTANEOUS
  Filled 2020-06-11 (×6): qty 0.05

## 2020-06-11 MED ORDER — ACETAMINOPHEN 325 MG PO TABS
650.0000 mg | ORAL_TABLET | ORAL | Status: DC | PRN
  Filled 2020-06-11: qty 2

## 2020-06-11 MED ORDER — DEXTROSE IN LACTATED RINGERS 5 % IV SOLN: INTRAVENOUS | Status: DC

## 2020-06-11 MED ORDER — INSULIN ASPART 100 UNIT/ML ~~LOC~~ SOLN
0.0000 [IU] | SUBCUTANEOUS | Status: DC
  Administered 2020-06-11: 8 [IU] via SUBCUTANEOUS
  Administered 2020-06-11: 15 [IU] via SUBCUTANEOUS
  Administered 2020-06-12: 2 [IU] via SUBCUTANEOUS
  Administered 2020-06-13: 5 [IU] via SUBCUTANEOUS
  Administered 2020-06-13: 2 [IU] via SUBCUTANEOUS
  Administered 2020-06-13: 3 [IU] via SUBCUTANEOUS
  Administered 2020-06-13: 2 [IU] via SUBCUTANEOUS
  Administered 2020-06-13 – 2020-06-14 (×2): 8 [IU] via SUBCUTANEOUS
  Administered 2020-06-14: 5 [IU] via SUBCUTANEOUS
  Administered 2020-06-14 (×2): 3 [IU] via SUBCUTANEOUS
  Administered 2020-06-15: 5 [IU] via SUBCUTANEOUS
  Administered 2020-06-15: 15 [IU] via SUBCUTANEOUS
  Administered 2020-06-15: 3 [IU] via SUBCUTANEOUS
  Administered 2020-06-16: 5 [IU] via SUBCUTANEOUS
  Administered 2020-06-16: 2 [IU] via SUBCUTANEOUS
  Filled 2020-06-11 (×2): qty 1

## 2020-06-11 MED ORDER — ATORVASTATIN CALCIUM 80 MG PO TABS
80.0000 mg | ORAL_TABLET | Freq: Every day | ORAL | Status: DC
  Administered 2020-06-13 – 2020-06-16 (×4): 80 mg via ORAL
  Filled 2020-06-11: qty 2
  Filled 2020-06-11 (×2): qty 1
  Filled 2020-06-11: qty 2

## 2020-06-11 MED ORDER — VANCOMYCIN HCL 1500 MG/300ML IV SOLN
1500.0000 mg | Freq: Once | INTRAVENOUS | Status: AC
  Administered 2020-06-11: 1500 mg via INTRAVENOUS
  Filled 2020-06-11: qty 300

## 2020-06-11 MED ORDER — TAMSULOSIN HCL 0.4 MG PO CAPS
0.4000 mg | ORAL_CAPSULE | Freq: Every day | ORAL | Status: DC
  Administered 2020-06-13 – 2020-06-16 (×4): 0.4 mg via ORAL
  Filled 2020-06-11 (×4): qty 1

## 2020-06-11 MED ORDER — SODIUM CHLORIDE 0.9 % IV SOLN
2.0000 g | Freq: Once | INTRAVENOUS | Status: AC
  Administered 2020-06-11: 2 g via INTRAVENOUS
  Filled 2020-06-11: qty 2

## 2020-06-11 MED ORDER — WARFARIN SODIUM 1 MG PO TABS
1.0000 mg | ORAL_TABLET | Freq: Once | ORAL | Status: DC
  Filled 2020-06-11: qty 1

## 2020-06-11 MED ORDER — SODIUM CHLORIDE 0.9 % IV SOLN: 2.0000 g | Freq: Three times a day (TID) | INTRAVENOUS | Status: DC

## 2020-06-11 MED ORDER — ACETAMINOPHEN 160 MG/5ML PO SOLN
650.0000 mg | ORAL | Status: DC | PRN
  Filled 2020-06-11: qty 20.3

## 2020-06-11 MED ORDER — VANCOMYCIN HCL 750 MG/150ML IV SOLN: 750.0000 mg | INTRAVENOUS | Status: DC

## 2020-06-11 NOTE — ED Triage Notes (Signed)
EMS reports pt's son told ems pt couldn't sit up or "act normal."  Reports pt not eating and drinking well.  Reports ambulatory yesterday.  CBG 566 with ems.  EMS started 18g in left ac and 22g in r hand.  Reports pt on 2nd 500 bag of Normal saline.  Reports pt lives at home with son.  Pt had heart surgery approx 2 months ago.

## 2020-06-11 NOTE — Progress Notes (Signed)
Pharmacy Antibiotic Note  Scott Rios is a 64 y.o. male admitted on 06-28-2020 with infection of unknown source..  Pharmacy has been consulted for vancomycin and aztreonam  dosing.  Patient has a history of PCN allergy as a child, but he doesn't remember his reaction and there is no history of cephalosporin use at a [facility] facility.   Plan: Start aztreonam 2g IV q8h Vancomycin loading dose: 1.5g  IV x1 dose Vancomycin maintenance dose: 750 mg IV  q24h Goal vancomycin trough range: 15-20 mcg/mL Pharmacy will continue to monitor renal function, vancomycin troughs as clinically appropriate,  cultures and patient progress.  Height: 5\' 7"  (170.2 cm) Weight: 59 kg (130 lb) IBW/kg (Calculated) : 66.1  Temp (24hrs), Avg:99.1 F (37.3 C), Min:98.8 F (37.1 C), Max:99.3 F (37.4 C)  Recent Labs  Lab Jun 28, 2020 1418 06/28/20 1556  WBC 21.7*  --   CREATININE 0.51*  --   LATICACIDVEN 1.6 1.5    Estimated Creatinine Clearance: 76.8 mL/min (A) (by C-G formula based on SCr of 0.51 mg/dL (L)).    Allergies  Allergen Reactions  . Tramadol Other (See Comments)    Itchiness, visual hallucination.  06/13/20 Penicillins      Unknown reaction was told it happened as a child    Antimicrobials this admission: vancomycin 10/28 >>   aztreonam 10/28 >> metronidazole 10/28 x1 dose     Microbiology results:  10/28 [facility] x2:    10/28 Resp PCR: SARS CoV-2:  negative         Flu A/B: negative  10/28  MRSA PCR:   Thank you for allowing pharmacy to participate in this patient's care.  11/28 2020/06/28 4:52 PM

## 2020-06-11 NOTE — H&P (Signed)
[facility] H&P   Patient Demographics:    Decorian Schuenemann, is a 64 y.o. male  MRN: [id number]   DOB - 1956-08-01  Admit Date - 07-03-20  Outpatient Primary MD for the patient is Elfredia Nevins, MD   Referring MD/NP/PA: PA hammond   Patient coming from: Home  Chief Complaint  Patient presents with  . Altered Mental Status      HPI:    Burle Kwan  is a 64 y.o. male,  with a history of DM 2, HTN, ongoing heavy smoking and COPD, previous EtOH abuse.  Other history includes multivessel coronary artery disease, severe aortic stenosis with insufficiency, mitral valve insufficiency, status post CABG x6, status post aortic valve replacement, status post mitral valve annuloplasty, acute pulmonary edema, acute systolic heart failure, history of depression, postoperative atrial fibrillation, hyperglycemia. -Patient lives with his son, patient is very poor historian history was obtained from son, chart and ED staff, patient has been recovering well after his surgery has been able to ambulate walker, but son noted him over the last 2 days, to have poor ambulation, unsteady gait, poor oral intake as well, with frequent urination, as well he was noted to be more confused, which prompted him to bring him to ED, and reports his father has been compliant with medications, patient himself is unable to provide any complaints due to confusion. -ED patient work-up was significant for CT head showing acute infarct in left periventricular area, EKG showing A. fib with RVR at 118, INR at 1.9, and within normal limits at 0.5, glucose elevated at 425 with expected pseudohyponatremia at 128, white blood cell elevated at 21.7, low-grade temperature 99.3, chest x-ray with no acute finding, UA is pending, lactic acid within normal limits, triage hospitalist consulted to admit.    Review of systems:    Patient is  confused, unable to get reliable review of systems.  With Past History of the following :    Past Medical History:  Diagnosis Date  . Diabetes mellitus without complication (HCC)   . Hypertension       Past Surgical History:  Procedure Laterality Date  . AORTIC VALVE REPLACEMENT N/A 04/09/2020   Procedure: AORTIC VALVE REPLACEMENT (AVR) USING EDWARDS INSPIRIS RESILIA 23 MM AORTIC VALVE.;  Surgeon: Alleen Borne, MD;  Location: [facility] OR;  Service: Open Heart Surgery;  Laterality: N/A;  . CHOLECYSTECTOMY    . CLIPPING OF ATRIAL APPENDAGE Left 04/09/2020   Procedure: CLIPPING OF ATRIAL APPENDAGE USING  ATRICURE [id number] ATRICLIP;  Surgeon: Alleen Borne, MD;  Location: [facility] OR;  Service: Open Heart Surgery;  Laterality: Left;  . CORONARY ARTERY BYPASS GRAFT N/A 04/09/2020   Procedure: CORONARY ARTERY BYPASS GRAFTING (CABG) X 6 USING LIMA TO LAD; ENDOSCOPICALLY HARVESTED RIGHT GREATER SAPHENOUS VEIN: SVG to D1; SVG seq to OM2 and OM3; SVG seq to PD and PL. AORTIC VALVE REPLACEMENT USING INSPIRIS RESILIA  23 MM AORTIC VALVE. 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 LEFT ATRIAL APPENDAGE CLIPPING USING ATRICURE PRO240 40 MM CLIP.;  Surgeon: Alleen Borne, MD;   . ENDOVEIN HARVEST OF GREATER SAPHENOUS VEIN Right 04/09/2020   Procedure: ENDOVEIN HARVEST OF GREATER SAPHENOUS VEIN;  Surgeon: Alleen Borne, MD;  Location: [facility] OR;  Service: Open Heart Surgery;  Laterality: Right;  . HERNIA REPAIR    . MITRAL VALVE REPAIR N/A 04/09/2020   Procedure: MITRAL VALVE REPLACEMENT (MVR) USING MEMO 4D 28 MM MITRAL VALVE;  Surgeon: Alleen Borne, MD;  Location: [facility] OR;  Service: Open Heart Surgery;  Laterality: N/A;  . MULTIPLE EXTRACTIONS WITH ALVEOLOPLASTY N/A 04/03/2020   Procedure: Extraction of tooth #'s 11,22,23,26,27,28,29 with alveoloplasty and bilateral mandibular tori reductions;  Surgeon: Charlynne Pander, DDS;  Location: [facility] OR;  Service: Oral Surgery;  Laterality: N/A;  . RIGHT/LEFT HEART CATH  AND CORONARY ANGIOGRAPHY N/A 03/30/2020   Procedure: RIGHT/LEFT HEART CATH AND CORONARY ANGIOGRAPHY;  Surgeon: Corky Crafts, MD;  Location: [facility] INVASIVE CV LAB;  Service: Cardiovascular;  Laterality: N/A;  . TEE WITHOUT CARDIOVERSION N/A 03/31/2020   Procedure: TRANSESOPHAGEAL ECHOCARDIOGRAM (TEE);  Surgeon: Jodelle Red, MD;  Location: [facility] ENDOSCOPY;  Service: Cardiovascular;  Laterality: N/A;  . TEE WITHOUT CARDIOVERSION N/A 04/09/2020   Procedure: TRANSESOPHAGEAL ECHOCARDIOGRAM (TEE);  Surgeon: Alleen Borne, MD;  Location: [facility] OR;  Service: Open Heart Surgery;  Laterality: N/A;      Social History:     Social History   Tobacco Use  . Smoking status: Former Smoker    Packs/day: 1.00    Types: Cigarettes    Quit date: 04/03/2020    Years since quitting: 0.1  . Smokeless tobacco: Never Used  Substance Use Topics  . Alcohol use: No       Family History :     Family History  Problem Relation Age of Onset  . CAD Father        died of MI in 81's      Home Medications:   Prior to Admission medications   Medication Sig Start Date End Date Taking? Authorizing Provider  acetaminophen (TYLENOL) 325 MG tablet Take 2 tablets (650 mg total) by mouth every 6 (six) hours as needed for mild pain or fever. 04/18/20   Gold, Wayne E, PA-C  atorvastatin (LIPITOR) 80 MG tablet Take 1 tablet (80 mg total) by mouth daily. 05/25/20   Alleen Borne, MD  ferrous fumarate-b12-vitamic C-folic acid (TRINSICON / FOLTRIN) capsule Take 1 capsule by mouth 2 (two) times daily after a meal. 04/18/20   Gold, Glenice Laine, PA-C  metFORMIN (GLUCOPHAGE) 500 MG tablet Take 1 tablet (500 mg total) by mouth 2 (two) times daily with a meal. 05/25/20   Bartle, Payton Doughty, MD  metoprolol succinate (TOPROL XL) 25 MG 24 hr tablet Take 0.5 tablets (12.5 mg total) by mouth daily. 05/25/20   Alleen Borne, MD  potassium chloride SA (KLOR-CON) 20 MEQ tablet Take 1 tablet (20 mEq total) by mouth 2 (two) times daily.  05/25/20   Alleen Borne, MD  tamsulosin (FLOMAX) 0.4 MG CAPS capsule Take 1 capsule (0.4 mg total) by mouth daily. 05/25/20   Alleen Borne, MD  torsemide (DEMADEX) 20 MG tablet Take 1 tablet (20 mg total) by mouth 2 (two) times daily. 05/25/20   Alleen Borne, MD  warfarin (COUMADIN) 2 MG tablet Take 1 tablet (2 mg total) by mouth daily at 4  PM. As directed by the coumadin clinic 04/18/20   Rowe ClackGold, Wayne E, PA-C     Allergies:     Allergies  Allergen Reactions  . Tramadol Other (See Comments)    Itchiness, visual hallucination.  Marland Kitchen. Penicillins      Unknown reaction was told it happened as a child     Physical Exam:   Vitals  Blood pressure 112/72, pulse (!) 108, temperature 99.3 F (37.4 C), temperature source Rectal, resp. rate 18, height 5\' 7"  (1.702 m), weight 59 kg, SpO2 97 %.   1. General elderly male, laying in bed, no apparent distress  2.  Pleasantly confused, answering some questions appropriately, he is awake alert oriented x2   3.  Patient with right tongue deviation, left upper extremity weakness, and bilateral lower extremity weakness .  4. Ears and Eyes appear Normal, Conjunctivae clear, PERRLA. dry Oral Mucosa.  5. Supple Neck, No JVD, No cervical lymphadenopathy appriciated, No Carotid Bruits.  6. Symmetrical Chest wall movement, Good air movement bilaterally, CTAB.  7.  Irregular irregular, No Gallops, Rubs ,+ Murmurs, No Parasternal Heave.  8. Positive Bowel Sounds, Abdomen Soft, No tenderness, No organomegaly appriciated,No rebound -guarding or rigidity.  9.  No Cyanosis, Normal Skin Turgor, right leg shin healing ulcers, please see pictures below.  10.  Significant muscle wasting,,  joints appear normal   11. No Palpable Lymph Nodes in Neck or Axillae       Data Review:    CBC Recent Labs  Lab 05/21/2020 1418  WBC 21.7*  HGB 10.5*  HCT 32.4*  PLT 191  MCV 82.9  MCH 26.9  MCHC 32.4  RDW 15.0  LYMPHSABS 0.6*  MONOABS 0.6  EOSABS  0.0  BASOSABS 0.1   ------------------------------------------------------------------------------------------------------------------  Chemistries  Recent Labs  Lab 05/26/2020 1418  NA 128*  K 3.8  CL 90*  CO2 30  GLUCOSE 425*  BUN 12  CREATININE 0.51*  CALCIUM 8.4*  MG 1.8  AST 18  ALT 23  ALKPHOS 139*  BILITOT 0.9   ------------------------------------------------------------------------------------------------------------------ estimated creatinine clearance is 76.8 mL/min (A) (by C-G formula based on SCr of 0.51 mg/dL (L)). ------------------------------------------------------------------------------------------------------------------ No results for input(s): TSH, T4TOTAL, T3FREE, THYROIDAB in the last 72 hours.  Invalid input(s): FREET3  Coagulation profile Recent Labs  Lab 05/30/2020 1418  INR 1.9*   ------------------------------------------------------------------------------------------------------------------- No results for input(s): DDIMER in the last 72 hours. -------------------------------------------------------------------------------------------------------------------  Cardiac Enzymes No results for input(s): CKMB, TROPONINI, MYOGLOBIN in the last 168 hours.  Invalid input(s): CK ------------------------------------------------------------------------------------------------------------------    Component Value Date/Time   BNP 382.0 (H) 05/25/2020 1418     ---------------------------------------------------------------------------------------------------------------  Urinalysis    Component Value Date/Time   COLORURINE STRAW (A) 04/09/2020 0113   APPEARANCEUR CLEAR 04/09/2020 0113   LABSPEC 1.006 04/09/2020 0113   PHURINE 8.0 04/09/2020 0113   GLUCOSEU NEGATIVE 04/09/2020 0113   HGBUR NEGATIVE 04/09/2020 0113   BILIRUBINUR NEGATIVE 04/09/2020 0113   KETONESUR NEGATIVE 04/09/2020 0113   PROTEINUR NEGATIVE 04/09/2020 0113   UROBILINOGEN  0.2 09/29/2012 1710   NITRITE NEGATIVE 04/09/2020 0113   LEUKOCYTESUR NEGATIVE 04/09/2020 0113    ----------------------------------------------------------------------------------------------------------------   Imaging Results:    CT Head Wo Contrast  Result Date: 06/01/2020 CLINICAL DATA:  Altered mental status. EXAM: CT HEAD WITHOUT CONTRAST TECHNIQUE: Contiguous axial images were obtained from the base of the skull through the vertex without intravenous contrast. COMPARISON:  April 03, 2020. FINDINGS: Brain: New low density is seen in right periventricular white matter concerning  for infarction of indeterminate age. No hemorrhage or mass lesion is noted. No midline shift is noted. Ventricular size is within normal limits. Vascular: No hyperdense vessel or unexpected calcification. Skull: Normal. Negative for fracture or focal lesion. Sinuses/Orbits: No acute finding. Other: None. IMPRESSION: New low density is seen in right periventricular white matter concerning for infarction of indeterminate age. MRI is recommended for further evaluation. Electronically Signed   By: Lupita Raider M.D.   On: 05/16/2020 15:30   DG Chest Port 1 View  Result Date: 06/10/2020 CLINICAL DATA:  Decreased eating and drinking. History of heart surgery 2 months ago. EXAM: PORTABLE CHEST 1 VIEW COMPARISON:  Chest radiograph dated 05/13/2020 FINDINGS: The heart size and mediastinal contours are within normal limits. Both lungs are clear. The visualized skeletal structures are unremarkable. Median sternotomy wires, cardiac valve prosthesis, and atrial appendage clip are redemonstrated. IMPRESSION: No active disease. Electronically Signed   By: Romona Curls M.D.   On: 05/23/2020 15:19    My personal review of EKG: Rhythm A flutter , Rate  112 /min, QTc466   Assessment & Plan:    Active Problems:   Tobacco abuse   Mitral regurgitation   S/P CABG x 6   Malnutrition of moderate degree   CAD (coronary artery  disease)   S/P mitral valve replacement   Atrial fibrillation (HCC)   Acute CVA (cerebrovascular accident) (HCC)    Acute CVA -Patient presented with altered mental status, weakness, unsteady gait over last 48 hours per son, CT head significant for [place], he will be admitted under CVA pathway, will obtain MRA head, carotid Dopplers, will obtain 2D echo, will monitor him on telemetry, will check A1c and lipid panel, PT/OT/SLP will be consulted as well. -He is with known history of A. fib, but his INR is at 1.9, so we will follow rest of work-up to see if this is related to atherosclerotic disease. -He does not appear to be a TPA candidate as his symptoms has been going on for 2 days. -Hold metoprolol and allow for permissive hypertension  Diabetes mellitus type 2 with hyperglycemia -Poorly controlled, patient compliant with medications per his son, will check A1c, will start on low-dose Levemir and sliding scale every 4 hours, no evidence of DKA as anion gap within normal limit, and beta hydroxybutyric acid within normal range, continue with IV fluids. -Resume Metformin when stable.  History  of CAD status post recent CABG x6 and 04/09/2020 . -Continue with statin, continue with warfarin, will resume metoprolol in 1 to 2 days as currently allowing permissive hypertension, he is currently denying any chest pain or shortness of breath .  chronic combined systolic (congestive) and diastolic (congestive) heart failure (HCC) -He appears dry currently, will hold his diuresis, will continue with IV fluids with close monitoring to avoid volume overload, will hold his torsemide for now. -Resume probably in 24 to 48 hours.   Severe aortic regurgitation - Status postAortic valve replacement using a 23 mm Edwards INSPIRIS RESILIA pericardial valve.  Moderate to severe mitral valve regurgitation. - Status post mitral valve repair with 28 mm Sorin, Memo 4D annuloplasty ring, clipping of left atrial  appendage.   Atrial fibrillation -Heart rate is is mildly elevated in the low 100s, will give  Digoxin X 2,  resume Toprol-XL sooner if becomes any RVR, . -Continue with warfarin, pharmacy to dose.  .  Acute Metabolic  encephalopathy -Is most likely due to acute CVA, hyperglycemia, unlikely she is having infectious  process, but white blood cell count is elevated at 2.1, but lactic acid is reassuring, UA is pending, chest x-ray no acute disease, he received broad-spectrum antibiotic in ED, will monitor him off antibiotics as he is nontoxic-appearing .  Protein calorie malnutrition  -We will consult nutritionist .  Tobacco t abuse -will be counseled, will start on nicotine patch   DVT Prophylaxis on warfain AM Labs Ordered, also please review Full Orders  Family Communication: Admission, patients condition and plan of care including tests being ordered have been discussed with the patient and son by phone* who indicate understanding and agree with the plan and Code Status.  Code Status Full  Likely DC to  home  Condition GUARDED    Consults called: neuro placed in EPIC    Admission status: inpatient    Time spent in minutes : 65 minutes   Huey Bienenstock M.D on 06/12/2020 at 5:19 PM   [facility] - Office  [phone]

## 2020-06-11 NOTE — ED Provider Notes (Signed)
[facility] EMERGENCY DEPARTMENT Provider Note   CSN: [id number] Arrival date & time: 05/30/2020  1258     History Chief Complaint  Patient presents with  . Altered Mental Status    Scott Rios is a 64 y.o. male who presents today for evaluation of altered mental status. History obtained from patient's son whom patient lives with, and chart review.  Patient son reports that patient has not been eating well for a few days however notes that over the past 3 to 4 days he has had significantly increasing water intake.  He did vomit once a few days ago.  No known diarrhea.  Unknown last bowel movement.  He denies any cough or shortness of breath.  Patient son states that patient has not been complaining of any new aches or pains and has been taking all his medications as directed.  Patient reportedly had been normal mental status when he went to bed last night, however today since waking up he has been leaning to the right side, and has been becoming more and more confused.  Patient's son states that when EMS got there patient was only able to tell them his first name, none of the other A&O questions.  Patient son reports that he has not had anything similar in the past.  Patient's son states that home health comes out and works with patient and he has not had any fevers during any of these checks.  Chart review shows that on 05/27/2020 there was a note that patient's INR was elevated, apparently in the office it was 7.8, and at lab it was 5.2.  He was given instructions on holding his Coumadin.    Review of discharge summary from 04/17/2020 reports patient has a past history of alcohol abuse about 10 years ago.  b reports that shortly prior to admission he had weakness in his right leg along with subjective numbness.  He had a MRI showing congenital spinal canal narrowing which were felt to be responsible for this symptom.  On 04/09/2020 he had a 6 vessel CABG with mitral valve annuloplasty, aortic  valve replacement and clipping of the left atrial appendage.    He remained in A. fib after the surgery with ventricular rates in the 60-70 range with discharge summary stating that he becomes very bradycardic with any nodal blocking agents.  He additionally had severe multicoronary vessel disease  With EMS patient had 1 L normal saline.  His CBG was reportedly 566.    HPI     Past Medical History:  Diagnosis Date  . Diabetes mellitus without complication (HCC)   . Hypertension     Patient Active Problem List   Diagnosis Date Noted  . Acute CVA (cerebrovascular accident) (HCC) 05/19/2020  . S/P mitral valve replacement 04/22/2020  . Atrial fibrillation (HCC) 04/22/2020  . Status post aortic valve replacement with bioprosthetic valve 04/22/2020  . Current use of long term anticoagulation 04/22/2020  . CAD (coronary artery disease)   . Pressure injury of skin 04/12/2020  . Adjustment disorder with depressed mood 04/11/2020  . Malnutrition of moderate degree 04/10/2020  . S/P CABG x 6 04/09/2020  . Non-ST elevation (NSTEMI) myocardial infarction (HCC)   . Right leg weakness 03/28/2020  . Flash pulmonary edema (HCC) 03/28/2020  . Hypomagnesemia 03/28/2020  . Hypoalbuminemia 03/28/2020  . Hyperglycemia 03/28/2020  . Hyponatremia 03/28/2020  . Elevated troponin 03/28/2020  . Pulmonary nodule 03/28/2020  . Acute respiratory failure with hypoxia (HCC) 03/28/2020  .  Tobacco abuse 03/28/2020  . Acute pulmonary edema (HCC)   . Weakness of right lower extremity   . Acute systolic heart failure (HCC)   . Mitral regurgitation   . Nonrheumatic aortic valve insufficiency     Past Surgical History:  Procedure Laterality Date  . AORTIC VALVE REPLACEMENT N/A 04/09/2020   Procedure: AORTIC VALVE REPLACEMENT (AVR) USING EDWARDS INSPIRIS RESILIA 23 MM AORTIC VALVE.;  Surgeon: Alleen Borne, MD;  Location: [facility] OR;  Service: Open Heart Surgery;  Laterality: N/A;  . CHOLECYSTECTOMY    .  CLIPPING OF ATRIAL APPENDAGE Left 04/09/2020   Procedure: CLIPPING OF ATRIAL APPENDAGE USING  ATRICURE [id number] ATRICLIP;  Surgeon: Alleen Borne, MD;  Location: [facility] OR;  Service: Open Heart Surgery;  Laterality: Left;  . CORONARY ARTERY BYPASS GRAFT N/A 04/09/2020   Procedure: CORONARY ARTERY BYPASS GRAFTING (CABG) X 6 USING LIMA TO LAD; ENDOSCOPICALLY HARVESTED RIGHT GREATER SAPHENOUS VEIN: SVG to D1; SVG seq to OM2 and OM3; SVG seq to PD and PL. AORTIC VALVE REPLACEMENT USING INSPIRIS RESILIA 23 MM AORTIC VALVE. 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 LEFT ATRIAL APPENDAGE CLIPPING USING ATRICURE PRO240 40 MM CLIP.;  Surgeon: Alleen Borne, MD;   . ENDOVEIN HARVEST OF GREATER SAPHENOUS VEIN Right 04/09/2020   Procedure: ENDOVEIN HARVEST OF GREATER SAPHENOUS VEIN;  Surgeon: Alleen Borne, MD;  Location: [facility] OR;  Service: Open Heart Surgery;  Laterality: Right;  . HERNIA REPAIR    . MITRAL VALVE REPAIR N/A 04/09/2020   Procedure: MITRAL VALVE REPLACEMENT (MVR) USING MEMO 4D 28 MM MITRAL VALVE;  Surgeon: Alleen Borne, MD;  Location: [facility] OR;  Service: Open Heart Surgery;  Laterality: N/A;  . MULTIPLE EXTRACTIONS WITH ALVEOLOPLASTY N/A 04/03/2020   Procedure: Extraction of tooth #'s 11,22,23,26,27,28,29 with alveoloplasty and bilateral mandibular tori reductions;  Surgeon: Charlynne Pander, DDS;  Location: [facility] OR;  Service: Oral Surgery;  Laterality: N/A;  . RIGHT/LEFT HEART CATH AND CORONARY ANGIOGRAPHY N/A 03/30/2020   Procedure: RIGHT/LEFT HEART CATH AND CORONARY ANGIOGRAPHY;  Surgeon: Corky Crafts, MD;  Location: [facility] INVASIVE CV LAB;  Service: Cardiovascular;  Laterality: N/A;  . TEE WITHOUT CARDIOVERSION N/A 03/31/2020   Procedure: TRANSESOPHAGEAL ECHOCARDIOGRAM (TEE);  Surgeon: Jodelle Red, MD;  Location: [facility] ENDOSCOPY;  Service: Cardiovascular;  Laterality: N/A;  . TEE WITHOUT CARDIOVERSION N/A 04/09/2020   Procedure: TRANSESOPHAGEAL ECHOCARDIOGRAM (TEE);  Surgeon:  Alleen Borne, MD;  Location: [facility] OR;  Service: Open Heart Surgery;  Laterality: N/A;       Family History  Problem Relation Age of Onset  . CAD Father        died of MI in 44's    Social History   Tobacco Use  . Smoking status: Former Smoker    Packs/day: 1.00    Types: Cigarettes    Quit date: 04/03/2020    Years since quitting: 0.1  . Smokeless tobacco: Never Used  Vaping Use  . Vaping Use: Never used  Substance Use Topics  . Alcohol use: No  . Drug use: No    Home Medications Prior to Admission medications   Medication Sig Start Date End Date Taking? Authorizing Provider  acetaminophen (TYLENOL) 325 MG tablet Take 2 tablets (650 mg total) by mouth every 6 (six) hours as needed for mild pain or fever. 04/18/20   Gold, Wayne E, PA-C  atorvastatin (LIPITOR) 80 MG tablet Take 1 tablet (80 mg total) by mouth daily. 05/25/20   Alleen Borne,  MD  ferrous fumarate-b12-vitamic C-folic acid (TRINSICON / FOLTRIN) capsule Take 1 capsule by mouth 2 (two) times daily after a meal. 04/18/20   Gold, Glenice Laine, PA-C  metFORMIN (GLUCOPHAGE) 500 MG tablet Take 1 tablet (500 mg total) by mouth 2 (two) times daily with a meal. 05/25/20   Bartle, Payton Doughty, MD  metoprolol succinate (TOPROL XL) 25 MG 24 hr tablet Take 0.5 tablets (12.5 mg total) by mouth daily. 05/25/20   Alleen Borne, MD  potassium chloride SA (KLOR-CON) 20 MEQ tablet Take 1 tablet (20 mEq total) by mouth 2 (two) times daily. 05/25/20   Alleen Borne, MD  tamsulosin (FLOMAX) 0.4 MG CAPS capsule Take 1 capsule (0.4 mg total) by mouth daily. 05/25/20   Alleen Borne, MD  torsemide (DEMADEX) 20 MG tablet Take 1 tablet (20 mg total) by mouth 2 (two) times daily. 05/25/20   Alleen Borne, MD  warfarin (COUMADIN) 2 MG tablet Take 1 tablet (2 mg total) by mouth daily at 4 PM. As directed by the coumadin clinic 04/18/20   Rowe Clack, PA-C    Allergies    Tramadol and Penicillins  Review of Systems   Review of Systems  Unable  to perform ROS: Mental status change    Physical Exam Updated Vital Signs BP 118/61   Pulse (!) 106   Temp 99.3 F (37.4 C) (Rectal)   Resp (!) 27   Ht 5\' 7"  (1.702 m)   Wt 59 kg   SpO2 100%   BMI 20.36 kg/m   Physical Exam Vitals and nursing note reviewed.  Constitutional:      General: He is not in acute distress.    Appearance: He is well-developed. He is not diaphoretic.  HENT:     Head: Normocephalic and atraumatic.     Mouth/Throat:     Mouth: Mucous membranes are dry.  Eyes:     General: No scleral icterus.       Right eye: No discharge.        Left eye: No discharge.     Conjunctiva/sclera: Conjunctivae normal.  Cardiovascular:     Rate and Rhythm: Regular rhythm. Tachycardia present.  Pulmonary:     Effort: Pulmonary effort is normal. No respiratory distress.     Breath sounds: Normal breath sounds. No stridor.  Abdominal:     General: There is no distension.     Tenderness: There is no abdominal tenderness. There is no guarding.  Musculoskeletal:        General: No deformity.     Cervical back: Normal range of motion.     Right lower leg: No edema.     Left lower leg: No edema.  Skin:    General: Skin is warm and dry.  Neurological:     Mental Status: He is alert.     Motor: No abnormal muscle tone.     Comments: Patient is awake, he is oriented to person, not to place or time.  He spontaneously moves all 4 extremities.  5/5 grip strength bilaterally.  He is able to lift his bilateral legs off the bed.  No obvious facial droop or slurred speech.  Coordination is grossly intact patient is able to grab objects including bedrail without difficulty.  Psychiatric:        Behavior: Behavior normal.     ED Results / Procedures / Treatments   Labs (all labs ordered are listed, but only abnormal results are displayed) Labs Reviewed  COMPREHENSIVE METABOLIC PANEL - Abnormal; Notable for the following components:      Result Value   Sodium 128 (*)     Chloride 90 (*)    Glucose, Bld 425 (*)    Creatinine, Ser 0.51 (*)    Calcium 8.4 (*)    Total Protein 6.0 (*)    Albumin 2.4 (*)    Alkaline Phosphatase 139 (*)    All other components within normal limits  CBC WITH DIFFERENTIAL/PLATELET - Abnormal; Notable for the following components:   WBC 21.7 (*)    RBC 3.91 (*)    Hemoglobin 10.5 (*)    HCT 32.4 (*)    Neutro Abs 20.2 (*)    Lymphs Abs 0.6 (*)    Abs Immature Granulocytes 0.23 (*)    All other components within normal limits  BRAIN NATRIURETIC PEPTIDE - Abnormal; Notable for the following components:   B Natriuretic Peptide 382.0 (*)    All other components within normal limits  PROTIME-INR - Abnormal; Notable for the following components:   Prothrombin Time 20.8 (*)    INR 1.9 (*)    All other components within normal limits  CBG MONITORING, ED - Abnormal; Notable for the following components:   Glucose-Capillary 368 (*)    All other components within normal limits  TROPONIN I (HIGH SENSITIVITY) - Abnormal; Notable for the following components:   Troponin I (High Sensitivity) 42 (*)    All other components within normal limits  TROPONIN I (HIGH SENSITIVITY) - Abnormal; Notable for the following components:   Troponin I (High Sensitivity) 38 (*)    All other components within normal limits  RESPIRATORY PANEL BY RT PCR (FLU A&B, COVID)  CULTURE, BLOOD (ROUTINE X 2)  CULTURE, BLOOD (ROUTINE X 2)  MRSA PCR SCREENING  URINE CULTURE  LACTIC ACID, PLASMA  LACTIC ACID, PLASMA  MAGNESIUM  BETA-HYDROXYBUTYRIC ACID  URINALYSIS, ROUTINE W REFLEX MICROSCOPIC  RAPID URINE DRUG SCREEN, HOSP PERFORMED  OSMOLALITY  CBC  BASIC METABOLIC PANEL  HEMOGLOBIN A1C    EKG EKG Interpretation  Date/Time:  Thursday June 11 2020 13:25:48 EDT Ventricular Rate:  112 PR Interval:    QRS Duration: 124 QT Interval:  342 QTC Calculation: 466 R Axis:   -66 Text Interpretation: Atrial flutter with variable A-V block Left anterior  fascicular block Minimal voltage criteria for LVH, may be normal variant ( Cornell product ) Septal infarct , age undetermined Abnormal ECG May be sinus Confirmed by Vanetta Mulders [phone]) on 05/28/2020 2:13:54 PM   Radiology CT Head Wo Contrast  Result Date: 06/10/2020 CLINICAL DATA:  Altered mental status. EXAM: CT HEAD WITHOUT CONTRAST TECHNIQUE: Contiguous axial images were obtained from the base of the skull through the vertex without intravenous contrast. COMPARISON:  April 03, 2020. FINDINGS: Brain: New low density is seen in right periventricular white matter concerning for infarction of indeterminate age. No hemorrhage or mass lesion is noted. No midline shift is noted. Ventricular size is within normal limits. Vascular: No hyperdense vessel or unexpected calcification. Skull: Normal. Negative for fracture or focal lesion. Sinuses/Orbits: No acute finding. Other: None. IMPRESSION: New low density is seen in right periventricular white matter concerning for infarction of indeterminate age. MRI is recommended for further evaluation. Electronically Signed   By: Lupita Raider M.D.   On: 06/08/2020 15:30   DG Chest Port 1 View  Result Date: 05/17/2020 CLINICAL DATA:  Decreased eating and drinking. History of heart surgery 2 months ago. EXAM: PORTABLE CHEST  1 VIEW COMPARISON:  Chest radiograph dated 05/13/2020 FINDINGS: The heart size and mediastinal contours are within normal limits. Both lungs are clear. The visualized skeletal structures are unremarkable. Median sternotomy wires, cardiac valve prosthesis, and atrial appendage clip are redemonstrated. IMPRESSION: No active disease. Electronically Signed   By: Romona Curlsyler  Litton M.D.   On: 03/10/2020 15:19    Procedures .Critical Care Performed by: Cristina GongHammond, Lavida Patch W, PA-C Authorized by: Cristina GongHammond, Malania Gawthrop W, PA-C   Critical care provider statement:    Critical care time (minutes):  45   Critical care was necessary to treat or prevent  imminent or life-threatening deterioration of the following conditions:  Dehydration and endocrine crisis   Critical care was time spent personally by me on the following activities:  Discussions with consultants, evaluation of patient's response to treatment, examination of patient, ordering and performing treatments and interventions, ordering and review of laboratory studies, ordering and review of radiographic studies, pulse oximetry, re-evaluation of patient's condition, obtaining history from patient or surrogate and review of old charts Comments:     Hyperglycemia requiring IV insulin, dehydration requiring IV fluids, admission, initiation of broad-spectrum antibiotics.   (including critical care time)  Medications Ordered in ED Medications  aztreonam (AZACTAM) 2 g in sodium chloride 0.9 % 100 mL IVPB (2 g Intravenous New Bag/Given 11-Apr-2020 1720)  lactated ringers infusion (has no administration in time range)  dextrose 50 % solution 0-50 mL (has no administration in time range)  vancomycin (VANCOREADY) IVPB 1500 mg/300 mL (has no administration in time range)  vancomycin (VANCOREADY) IVPB 750 mg/150 mL (has no administration in time range)  aztreonam (AZACTAM) 2 g in sodium chloride 0.9 % 100 mL IVPB (has no administration in time range)  insulin aspart (novoLOG) injection 0-15 Units (has no administration in time range)  insulin detemir (LEVEMIR) injection 5 Units (5 Units Subcutaneous Given 11-Apr-2020 1727)  digoxin (LANOXIN) 0.25 MG/ML injection 0.125 mg (has no administration in time range)  lactated ringers bolus 500 mL (0 mLs Intravenous Stopped 11-Apr-2020 1555)  metroNIDAZOLE (FLAGYL) IVPB 500 mg (0 mg Intravenous Stopped 11-Apr-2020 1719)    ED Course  I have reviewed the triage vital signs and the nursing notes.  Pertinent labs & imaging results that were available during my care of the patient were reviewed by me and considered in my medical decision making (see chart for  details).  Clinical Course as of Jun 11 1741  Thu Jun 11, 2020  1322 Has been not eating or drinking well for a few days. No cough shortness of breath, vomiting one episode yesterday.  Would lean off to the right since waking up this morning.  He has been confused.  Normally is alert and oriented to person, place, time and interacts normally.  Son reports that over the past 3 to 4 days he has been drinking very large amounts of water.  He did vomit once a few days ago however overall decreased appetite.  No known diarrhea.  No known coughing or shortness of breath.  Has not been complaining of any new pain.  Has been taking all of his medications.   [EH]  1521 Patient meets SIRS criteria as he is tachycardic with leukocytosis.  No clear source of infection identified at this time therefore does not meet sepsis criteria, however based on significantly elevated white count, age will cover with broad-spectrum antibiotics.   [EH]  1522 Patient's 30/kg fluid bolus would be 1770 mL.  He got 1 L with EMS, I  have ordered a second bolus.  He is not hypotensive, initial lactic acid is still pending therefore does not require 30/kg fluid bolus at this time.   [EH]  1700 I spoke with Hospitalist and Dr. Gerilyn Pilgrim with neurology, he will evaluate patient and make recommendations.    [EH]  1704 I spoke with and updated patient's son.   [EH]    Clinical Course User Index [EH] Norman Clay   MDM Rules/Calculators/A&P                          Patient is a 64 year old man who presents today for evaluation of altered mental status and weakness.  According the patient's son he is normally ANO x4, however he is oriented only to person today.  He is tachycardic.  He is significantly hyperglycemic.  He got 1 L prior to arrival normal saline by EMS and I ordered an additional 500cc as his mucous membranes continue to be dry on exam bringing his total fluid to nearly 64ml/kg.  He did not actually require  a 30 ml/kg based on sepsis as his lactic was not over 4 and he was not hypotensive.   CMP his glucose is still significantly elevated at 425.  He has mild hyponatremia with a sodium of 128.  No anion gap with a CO2 of 30.  Beta hydroxybutyric acid is not elevated.  Magnesium is normal.  Osmolality is ordered however has not resulted yet.  Orders are placed for IV Endo tool/glucose stabilizer for hyperglycemia.  CBC shows leukocytosis of 21.7.  With this, combined with his tachycardia and immunocompromise state based on the degree of his hyperglycemia he is started on broad-spectrum sepsis type antibiotics.  He meets SIRS criteria however no clear bacterial source is currently identified.  Both blood and urine cultures are ordered.  Thick acid is not significantly elevated, but this was obtained after patient received IV fluids.  In and out catheter ordered, according to RN patient would not allow this therefore UA, urine culture, and UDS are still awaiting collection.  Given the confusion CT head was obtained showing concern for a possible age-indeterminate stroke.  Patient was outside of TPA window on arrival based on last seen normal, and does not appear to meet criteria for LVO at this time.    MRI brain is ordered.  I spoke with Dr. Gerilyn Pilgrim of neurology who will see the patient in consult.  I spoke with Dr. Westley Gambles who will see the patient for admission.    This patient was seen as a shared visit with Dr. Blinda Leatherwood.    Note: Portions of this report may have been transcribed using voice recognition software. Every effort was made to ensure accuracy; however, inadvertent computerized transcription errors may be present  Final Clinical Impression(s) / ED Diagnoses Final diagnoses:  Altered mental status, unspecified altered mental status type    Rx / DC Orders ED Discharge Orders    None       Cristina Gong, PA-C June 27, 2020 1752    Gilda Crease, MD 2020/06/27 2352

## 2020-06-11 NOTE — Progress Notes (Addendum)
ANTICOAGULATION CONSULT NOTE - Initial Consult  Pharmacy Consult for warfarin dosing Indication: atrial fibrillation  Allergies  Allergen Reactions  . Tramadol Other (See Comments)    Itchiness, visual hallucination.  Marland Kitchen Penicillins      Unknown reaction was told it happened as a child    Patient Measurements: Height: 5\' 7"  (170.2 cm) Weight: 59 kg (130 lb) IBW/kg (Calculated) : 66.1 Heparin Dosing Weight: HEPARIN DW (KG): 59  Vital Signs: Temp: 99.3 F (37.4 C) (10/28 1452) Temp Source: Rectal (10/28 1452) BP: 112/72 (10/28 1600) Pulse Rate: 108 (10/28 1625)  Labs: Recent Labs    06/28/2020 1418 07/13/2020 1556  HGB 10.5*  --   HCT 32.4*  --   PLT 191  --   LABPROT 20.8*  --   INR 1.9*  --   CREATININE 0.51*  --   TROPONINIHS 42* 38*    Estimated Creatinine Clearance: 76.8 mL/min (A) (by C-G formula based on SCr of 0.51 mg/dL (L)).   Medical History: Past Medical History:  Diagnosis Date  . Diabetes mellitus without complication (HCC)   . Hypertension       Assessment: Pharmacy consulted to dose warfarin for this 64 yo male on chronic anti-coagulation therapy with warfarin for atrial fibrillation.    Current INR: 1.9 Home dose: will need to confirm last dosing regimen (last regimen of 12mg /week resulted in INR of 5.2)   Goal of Therapy:  INR 2-3 Monitor platelets by anticoagulation protocol: Yes   Plan:  Give warfarin 1mg  tonight x1 dose Daily INR and every other day CBC Monitor for signs and symptoms of bleeding.  76 06/30/2020,5:21 PM

## 2020-06-11 NOTE — ED Notes (Signed)
Pt aware of urine sample needed.

## 2020-06-12 ENCOUNTER — Inpatient Hospital Stay (HOSPITAL_COMMUNITY): Payer: Medicare Other

## 2020-06-12 DIAGNOSIS — I6389 Other cerebral infarction: Secondary | ICD-10-CM

## 2020-06-12 DIAGNOSIS — A4181 Sepsis due to Enterococcus: Secondary | ICD-10-CM | POA: Diagnosis present

## 2020-06-12 DIAGNOSIS — B952 Enterococcus as the cause of diseases classified elsewhere: Secondary | ICD-10-CM | POA: Diagnosis present

## 2020-06-12 LAB — BLOOD CULTURE ID PANEL (REFLEXED) - BCID2

## 2020-06-12 LAB — ECHOCARDIOGRAM COMPLETE
AR max vel: 2.25 cm2
AV Area VTI: 2.23 cm2
AV Area mean vel: 2.17 cm2
AV Mean grad: 11.4 mmHg
AV Peak grad: 19.4 mmHg
Ao pk vel: 2.2 m/s
Area-P 1/2: 2.26 cm2
Height: 67 in
S' Lateral: 2.99 cm
Weight: 2038.81 oz

## 2020-06-12 LAB — CBC
HCT: 29.1 % — ABNORMAL LOW (ref 39.0–52.0)
Hemoglobin: 9.6 g/dL — ABNORMAL LOW (ref 13.0–17.0)
MCH: 27.6 pg (ref 26.0–34.0)
MCHC: 33 g/dL (ref 30.0–36.0)
MCV: 83.6 fL (ref 80.0–100.0)
Platelets: 175 10*3/uL (ref 150–400)
RBC: 3.48 MIL/uL — ABNORMAL LOW (ref 4.22–5.81)
RDW: 15.4 % (ref 11.5–15.5)
WBC: 21.2 10*3/uL — ABNORMAL HIGH (ref 4.0–10.5)
nRBC: 0 % (ref 0.0–0.2)

## 2020-06-12 LAB — BASIC METABOLIC PANEL
Anion gap: 7 (ref 5–15)
BUN: 13 mg/dL (ref 8–23)
CO2: 30 mmol/L (ref 22–32)
Calcium: 8.4 mg/dL — ABNORMAL LOW (ref 8.9–10.3)
Chloride: 95 mmol/L — ABNORMAL LOW (ref 98–111)
Creatinine, Ser: 0.46 mg/dL — ABNORMAL LOW (ref 0.61–1.24)
GFR, Estimated: >60 mL/min (ref >60)
Glucose, Bld: 96 mg/dL (ref 70–99)
Potassium: 3.3 mmol/L — ABNORMAL LOW (ref 3.5–5.1)
Sodium: 132 mmol/L — ABNORMAL LOW (ref 135–145)

## 2020-06-12 LAB — CBG MONITORING, ED
Glucose-Capillary: 138 mg/dL — ABNORMAL HIGH (ref 70–99)
Glucose-Capillary: 91 mg/dL (ref 70–99)
Glucose-Capillary: 97 mg/dL (ref 70–99)

## 2020-06-12 LAB — PROTIME-INR
INR: 2 — ABNORMAL HIGH (ref 0.8–1.2)
Prothrombin Time: 22.1 seconds — ABNORMAL HIGH (ref 11.4–15.2)

## 2020-06-12 LAB — LIPID PANEL
Cholesterol: 62 mg/dL (ref 0–200)
HDL: 19 mg/dL — ABNORMAL LOW (ref >40)
LDL Cholesterol: 31 mg/dL (ref 0–99)
Total CHOL/HDL Ratio: 3.3 RATIO
Triglycerides: 61 mg/dL (ref <150)
VLDL: 12 mg/dL (ref 0–40)

## 2020-06-12 LAB — GLUCOSE, CAPILLARY
Glucose-Capillary: 103 mg/dL — ABNORMAL HIGH (ref 70–99)
Glucose-Capillary: 111 mg/dL — ABNORMAL HIGH (ref 70–99)
Glucose-Capillary: 140 mg/dL — ABNORMAL HIGH (ref 70–99)
Glucose-Capillary: 141 mg/dL — ABNORMAL HIGH (ref 70–99)

## 2020-06-12 LAB — HEMOGLOBIN A1C
Hgb A1c MFr Bld: 11.7 % — ABNORMAL HIGH (ref 4.8–5.6)
Mean Plasma Glucose: 289 mg/dL

## 2020-06-12 MED ORDER — METOPROLOL TARTRATE 5 MG/5ML IV SOLN
2.5000 mg | Freq: Three times a day (TID) | INTRAVENOUS | Status: DC
  Administered 2020-06-12 – 2020-06-16 (×12): 2.5 mg via INTRAVENOUS
  Filled 2020-06-12 (×12): qty 5

## 2020-06-12 MED ORDER — POTASSIUM CHLORIDE 10 MEQ/100ML IV SOLN
10.0000 meq | INTRAVENOUS | Status: DC
  Administered 2020-06-12 (×2): 10 meq via INTRAVENOUS
  Filled 2020-06-12 (×2): qty 100

## 2020-06-12 MED ORDER — PHENYLEPHRINE HCL 2.5 % OP SOLN
OPHTHALMIC | Status: AC
  Filled 2020-06-12: qty 15

## 2020-06-12 MED ORDER — NEOMYCIN-POLYMYXIN-DEXAMETH 3.5-10000-0.1 OP SUSP
OPHTHALMIC | Status: AC
  Filled 2020-06-12: qty 5

## 2020-06-12 MED ORDER — SODIUM CHLORIDE 0.9 % IV SOLN
2.0000 g | Freq: Two times a day (BID) | INTRAVENOUS | Status: DC
  Administered 2020-06-12 – 2020-06-14 (×5): 2 g via INTRAVENOUS
  Filled 2020-06-12 (×5): qty 20

## 2020-06-12 MED ORDER — WARFARIN SODIUM 1 MG PO TABS
1.0000 mg | ORAL_TABLET | Freq: Once | ORAL | Status: AC
  Administered 2020-06-12: 1 mg via ORAL
  Filled 2020-06-12: qty 1

## 2020-06-12 MED ORDER — VANCOMYCIN HCL 750 MG/150ML IV SOLN
750.0000 mg | INTRAVENOUS | Status: DC
  Filled 2020-06-12 (×3): qty 150

## 2020-06-12 MED ORDER — SODIUM CHLORIDE 0.9 % IV SOLN: INTRAVENOUS | Status: DC

## 2020-06-12 MED ORDER — VANCOMYCIN HCL 750 MG/150ML IV SOLN
750.0000 mg | Freq: Two times a day (BID) | INTRAVENOUS | Status: DC
  Administered 2020-06-12 – 2020-06-15 (×7): 750 mg via INTRAVENOUS
  Filled 2020-06-12 (×7): qty 150

## 2020-06-12 MED ORDER — LIDOCAINE HCL 3.5 % OP GEL
OPHTHALMIC | Status: AC
  Filled 2020-06-12: qty 1

## 2020-06-12 MED ORDER — CYCLOPENTOLATE-PHENYLEPHRINE 0.2-1 % OP SOLN
OPHTHALMIC | Status: AC
  Filled 2020-06-12: qty 2

## 2020-06-12 MED ORDER — ADULT MULTIVITAMIN W/MINERALS CH
1.0000 | ORAL_TABLET | Freq: Every day | ORAL | Status: DC
  Administered 2020-06-13 – 2020-06-16 (×4): 1 via ORAL
  Filled 2020-06-12 (×4): qty 1

## 2020-06-12 MED ORDER — MAGNESIUM SULFATE 2 GM/50ML IV SOLN
2.0000 g | Freq: Once | INTRAVENOUS | Status: AC
  Administered 2020-06-12: 2 g via INTRAVENOUS
  Filled 2020-06-12: qty 50

## 2020-06-12 MED ORDER — LIDOCAINE HCL (PF) 1 % IJ SOLN
INTRAMUSCULAR | Status: AC
  Filled 2020-06-12: qty 2

## 2020-06-12 MED ORDER — SODIUM CHLORIDE 0.9 % IV SOLN
2.0000 g | INTRAVENOUS | Status: DC
  Filled 2020-06-12 (×19): qty 2000

## 2020-06-12 NOTE — H&P (Addendum)
ID/stewardship virtual note   64 yo male hx AVR and mitral valve annuloplasty, smoker, dm2, etoh abuse admitted 10/28 for a few days weakness/ams, meeting sepsis criteria on admission, and found to have CVA and e faecalis bacteremia   tmax 102.5 Wbc 20s bcx by bcid e faecalis ucx in process imagings reviewed; cxr no acute process; mri bilateral hemisphere acute infarct   Abx: vanc 1 dose aztreonam (pcn childhood allergy) & metronidazole  Labs reviewed: Cr 0.5   A/p #high burden community e faecalis bacteremia #hx avr/mv annuloplasty #bilateral hemisphere infarct #sepsis   Need to r/o endocarditis Given clinical picture would treat as is. E faecalis usually sensitive to ampicillin, so would use endocarditis regimen amp/ceftriaxone for now   -stop vanc -start ampicillin 2 gram iv q4hours and ceftriaxone 2 gram iv q12hours -repeat blood cultures x2 -Please get TEE -please check for metastatic focus of infection (mri tender joint/back spine)  -please reengage ID once TEE is done   -please place pharmacy consult to evaluate documented pcn allergy; if low risk could do graded challenge with ampicillin. If tolerate start amp/ceftriaxone. -if can't tolerate or high risk allergy history, would continue with vancomycin

## 2020-06-12 NOTE — Plan of Care (Signed)
  Problem: Acute Rehab PT Goals(only PT should resolve) Goal: Pt Will Go Supine/Side To Sit Outcome: Progressing Flowsheets (Taken 06/12/2020 1614) Pt will go Supine/Side to Sit: with moderate assist Goal: Patient Will Transfer Sit To/From Stand Outcome: Progressing Flowsheets (Taken 06/12/2020 1614) Patient will transfer sit to/from stand:  with moderate assist  with maximum assist Goal: Pt Will Transfer Bed To Chair/Chair To Bed Outcome: Progressing Flowsheets (Taken 06/12/2020 1614) Pt will Transfer Bed to Chair/Chair to Bed:  with mod assist  with max assist Goal: Pt Will Ambulate Outcome: Progressing Flowsheets (Taken 06/12/2020 1614) Pt will Ambulate:  10 feet  with moderate assist  with maximum assist  with rolling walker   4:14 PM, 06/12/20 Ocie Bob, MPT Physical Therapist with [facility] [phone] office [phone] mobile phone

## 2020-06-12 NOTE — ED Notes (Signed)
Date and time results received: 06/12/20 4:38 AM(use smartphrase ".now" to insert current time)  Test: blood cultures Critical Value: gram positive cocci  Name of Provider Notified: Dr Robb Matar  Orders Received? Or Actions Taken?: see chart

## 2020-06-12 NOTE — Evaluation (Signed)
Physical Therapy Evaluation Patient Details Name: Scott Rios MRN: [id number] DOB: 05/18/1956 Today's Date: 06/12/2020   History of Present Illness  Scott Rios  is a 64 y.o. male,  with a history of DM 2, HTN, ongoing heavy smoking and COPD, previous EtOH abuse.  Other history includes multivessel coronary artery disease, severe aortic stenosis with insufficiency, mitral valve insufficiency, status post CABG x6, status post aortic valve replacement, status post mitral valve annuloplasty, acute pulmonary edema, acute systolic heart failure, history of depression, postoperative atrial fibrillation, hyperglycemia.-Patient lives with his son, patient is very poor historian history was obtained from son, chart and ED staff, patient has been recovering well after his surgery has been able to ambulate walker, but son noted him over the last 2 days, to have poor ambulation, unsteady gait, poor oral intake as well, with frequent urination, as well he was noted to be more confused, which prompted him to bring him to ED, and reports his father has been compliant with medications, patient himself is unable to provide any complaints due to confusion.-ED patient work-up was significant for CT head showing acute infarct in left periventricular area, EKG showing A. fib with RVR at 118, INR at 1.9, and within normal limits at 0.5, glucose elevated at 425 with expected pseudohyponatremia at 128, white blood cell elevated at 21.7, low-grade temperature 99.3, chest x-ray with no acute finding, UA is pending, lactic acid within normal limits, triage hospitalist consulted to admit.    Clinical Impression  Patient very weak and limited to sitting up at bedside, unable to maintain sitting balance with constant leaning forward and unable to attempt sit to stands.  Patient put back to bed - RN aware.  Patient will benefit from continued physical therapy in hospital and recommended venue below to increase strength, balance,  endurance for safe ADLs and gait.    Follow Up Recommendations SNF    Equipment Recommendations  None recommended by PT    Recommendations for Other Services       Precautions / Restrictions Precautions Precautions: Fall Restrictions Weight Bearing Restrictions: No      Mobility  Bed Mobility Overal bed mobility: Needs Assistance Bed Mobility: Supine to Sit;Sit to Supine     Supine to sit: Max assist Sit to supine: Max assist   General bed mobility comments: unable to maintain sitting balance due to weakness    Transfers                    Ambulation/Gait                Stairs            Wheelchair Mobility    Modified Rankin (Stroke Patients Only)       Balance Overall balance assessment: Needs assistance Sitting-balance support: Feet supported;No upper extremity supported Sitting balance-Leahy Scale: Poor Sitting balance - Comments: unable to maintain sitting balance seated at bedside, leans forward                                     Pertinent Vitals/Pain Pain Assessment: No/denies pain    Home Living Family/patient expects to be discharged to:: Private residence   Available Help at Discharge: Family;Available 24 hours/day Type of Home: House Home Access: Stairs to enter Entrance Stairs-Rails: None Entrance Stairs-Number of Steps: 3 Home Layout: One level Home Equipment: Cane - single point;Walker - 2 wheels;Shower seat;Bedside  commode      Prior Function Level of Independence: Independent with assistive device(s)         Comments: household ambulator using SPC     Hand Dominance   Dominant Hand: Right    Extremity/Trunk Assessment   Upper Extremity Assessment Upper Extremity Assessment: Generalized weakness    Lower Extremity Assessment Lower Extremity Assessment: Generalized weakness    Cervical / Trunk Assessment Cervical / Trunk Assessment: Kyphotic  Communication   Communication: No  difficulties  Cognition Arousal/Alertness: Awake/alert Behavior During Therapy: WFL for tasks assessed/performed Overall Cognitive Status: Within Functional Limits for tasks assessed                                        General Comments      Exercises     Assessment/Plan    PT Assessment Patient needs continued PT services  PT Problem List Decreased strength;Decreased activity tolerance;Decreased balance;Decreased mobility       PT Treatment Interventions DME instruction;Gait training;Stair training;Functional mobility training;Therapeutic activities;Patient/family education;Balance training    PT Goals (Current goals can be found in the Care Plan section)  Acute Rehab PT Goals Patient Stated Goal: return home after rehab PT Goal Formulation: With patient Time For Goal Achievement: 06/26/20 Potential to Achieve Goals: Fair    Frequency Min 2X/week   Barriers to discharge        Co-evaluation               AM-PAC PT "6 Clicks" Mobility  Outcome Measure Help needed turning from your back to your side while in a flat bed without using bedrails?: A Lot Help needed moving from lying on your back to sitting on the side of a flat bed without using bedrails?: A Lot Help needed moving to and from a bed to a chair (including a wheelchair)?: Total Help needed standing up from a chair using your arms (e.g., wheelchair or bedside chair)?: Total Help needed to walk in hospital room?: Total Help needed climbing 3-5 steps with a railing? : Total 6 Click Score: 8    End of Session   Activity Tolerance: Patient limited by fatigue;Patient tolerated treatment well Patient left: in bed;with call bell/phone within reach;with nursing/sitter in room Nurse Communication: Mobility status PT Visit Diagnosis: Unsteadiness on feet (R26.81);Other abnormalities of gait and mobility (R26.89);Muscle weakness (generalized) (M62.81)    Time: 3382-5053 PT Time Calculation  (min) (ACUTE ONLY): 22 min   Charges:   PT Evaluation $PT Eval Moderate Complexity: 1 Mod PT Treatments $Therapeutic Activity: 8-22 mins        4:12 PM, 06/12/20 Ocie Bob, MPT Physical Therapist with [facility] [phone] office [phone] mobile phone

## 2020-06-12 NOTE — ED Notes (Signed)
Pt repositioned and now lying on right side; pillow placed under right hip

## 2020-06-12 NOTE — Progress Notes (Signed)
ANTICOAGULATION CONSULT NOTE - Initial Consult  Pharmacy Consult for warfarin dosing Indication: atrial fibrillation  Allergies  Allergen Reactions  . Tramadol Other (See Comments)    Itchiness, visual hallucination.  Marland Kitchen Penicillins      Unknown reaction was told it happened as a child    Patient Measurements: Height: 5\' 7"  (170.2 cm) Weight: 57.8 kg (127 lb 6.8 oz) IBW/kg (Calculated) : 66.1 Heparin Dosing Weight: HEPARIN DW (KG): 57.8  Vital Signs: Temp: 98.2 F (36.8 C) (10/29 0914) Temp Source: Oral (10/29 0914) BP: 138/79 (10/29 0914) Pulse Rate: 86 (10/29 0914)  Labs: Recent Labs    2020/06/22 1418 06-22-20 1556 06/12/20 0608  HGB 10.5*  --  9.6*  HCT 32.4*  --  29.1*  PLT 191  --  175  LABPROT 20.8*  --  22.1*  INR 1.9*  --  2.0*  CREATININE 0.51*  --  0.46*  TROPONINIHS 42* 38*  --     Estimated Creatinine Clearance: 75.3 mL/min (A) (by C-G formula based on SCr of 0.46 mg/dL (L)).   Medical History: Past Medical History:  Diagnosis Date  . Diabetes mellitus without complication (HCC)   . Hypertension      Assessment: Pharmacy consulted to dose warfarin for this 64 yo male on chronic anti-coagulation therapy with warfarin for atrial fibrillation.     Home dose: unable to confirm last dosing regimen --pt is too confused and has had multiple strokes Last regimen of 12mg /week resulted in INR of 5.2   Drug Interactions: pt had one dose of IV metronidazole on 10-28  06-12-20 Update INR: 2.0 --> within goal range-->wafarin 1mg  dose wasn't given as ordered last night CBC: Hb 9.6  RN reports no signs/symptoms of bleeding  Goal of Therapy:  INR 2-3 Monitor platelets by anticoagulation protocol: Yes   Plan:  Repeat warfarin 1mg  tonight x1 dose Daily INR and every other day CBC Monitor for signs and symptoms of bleeding.  02-18-1989 06/12/2020,12:03 PM

## 2020-06-12 NOTE — Evaluation (Signed)
Clinical/Bedside Swallow Evaluation Patient Details  Name: JEFRY LESINSKI MRN: [id number] Date of Birth: [birth year]  Today's Date: 06/12/2020 Time: SLP Start Time (ACUTE ONLY): 1025 SLP Stop Time (ACUTE ONLY): 1045 SLP Time Calculation (min) (ACUTE ONLY): 20 min  Past Medical History:  Past Medical History:  Diagnosis Date  . Diabetes mellitus without complication (HCC)   . Hypertension    Past Surgical History:  Past Surgical History:  Procedure Laterality Date  . AORTIC VALVE REPLACEMENT N/A 04/09/2020   Procedure: AORTIC VALVE REPLACEMENT (AVR) USING EDWARDS INSPIRIS RESILIA 23 MM AORTIC VALVE.;  Surgeon: Alleen Borne, MD;  Location: [facility] OR;  Service: Open Heart Surgery;  Laterality: N/A;  . CHOLECYSTECTOMY    . CLIPPING OF ATRIAL APPENDAGE Left 04/09/2020   Procedure: CLIPPING OF ATRIAL APPENDAGE USING  ATRICURE [id number] ATRICLIP;  Surgeon: Alleen Borne, MD;  Location: [facility] OR;  Service: Open Heart Surgery;  Laterality: Left;  . CORONARY ARTERY BYPASS GRAFT N/A 04/09/2020   Procedure: CORONARY ARTERY BYPASS GRAFTING (CABG) X 6 USING LIMA TO LAD; ENDOSCOPICALLY HARVESTED RIGHT GREATER SAPHENOUS VEIN: SVG to D1; SVG seq to OM2 and OM3; SVG seq to PD and PL. AORTIC VALVE REPLACEMENT USING INSPIRIS RESILIA 23 MM AORTIC VALVE. 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 LEFT ATRIAL APPENDAGE CLIPPING USING ATRICURE PRO240 40 MM CLIP.;  Surgeon: Alleen Borne, MD;   . ENDOVEIN HARVEST OF GREATER SAPHENOUS VEIN Right 04/09/2020   Procedure: ENDOVEIN HARVEST OF GREATER SAPHENOUS VEIN;  Surgeon: Alleen Borne, MD;  Location: [facility] OR;  Service: Open Heart Surgery;  Laterality: Right;  . HERNIA REPAIR    . MITRAL VALVE REPAIR N/A 04/09/2020   Procedure: MITRAL VALVE REPLACEMENT (MVR) USING MEMO 4D 28 MM MITRAL VALVE;  Surgeon: Alleen Borne, MD;  Location: [facility] OR;  Service: Open Heart Surgery;  Laterality: N/A;  . MULTIPLE EXTRACTIONS WITH ALVEOLOPLASTY N/A 04/03/2020   Procedure:  Extraction of tooth #'s 11,22,23,26,27,28,29 with alveoloplasty and bilateral mandibular tori reductions;  Surgeon: Charlynne Pander, DDS;  Location: [facility] OR;  Service: Oral Surgery;  Laterality: N/A;  . RIGHT/LEFT HEART CATH AND CORONARY ANGIOGRAPHY N/A 03/30/2020   Procedure: RIGHT/LEFT HEART CATH AND CORONARY ANGIOGRAPHY;  Surgeon: Corky Crafts, MD;  Location: [facility] INVASIVE CV LAB;  Service: Cardiovascular;  Laterality: N/A;  . TEE WITHOUT CARDIOVERSION N/A 03/31/2020   Procedure: TRANSESOPHAGEAL ECHOCARDIOGRAM (TEE);  Surgeon: Jodelle Red, MD;  Location: [facility] ENDOSCOPY;  Service: Cardiovascular;  Laterality: N/A;  . TEE WITHOUT CARDIOVERSION N/A 04/09/2020   Procedure: TRANSESOPHAGEAL ECHOCARDIOGRAM (TEE);  Surgeon: Alleen Borne, MD;  Location: [facility] OR;  Service: Open Heart Surgery;  Laterality: N/A;   HPI:  Mekiah Cambridge  is a 64 y.o. male,  with a history of DM 2, HTN, ongoing heavy smoking and COPD, previous EtOH abuse.  Other history includes multivessel coronary artery disease, severe aortic stenosis with insufficiency, mitral valve insufficiency, status post CABG x6, status post aortic valve replacement, status post mitral valve annuloplasty, acute pulmonary edema, acute systolic heart failure, history of depression, postoperative atrial fibrillation, hyperglycemia.   Assessment / Plan / Recommendation Clinical Impression  Clinical swallowing evaluation completed while Pt was sitting upright in bed; Pt consumed ice chips, thin liquids, puree and regular textures initially without overt s/sx of oropharyngeal dysphagia. Note after a significant delay a slightly wet vocal quality presented and delayed coughing; with verbal cue to throat clear and swallow, wetness was cleared. No delayed coughing was noted when drinking  from the cup. Note edentulous status; Pt reports he only eats soft things at home. Recommend initiate a D2/fine chop diet and thin liquids, avoid straws; meds are ok  whole with liquids. Ensure Pt is sitting upright for all PO, ST will f/u X1 for diet tolerance and possible diet uprgrade to D3. SLE to follow, thank you.  SLP Visit Diagnosis: Dysphagia, unspecified (R13.10)    Aspiration Risk  Mild aspiration risk    Diet Recommendation Dysphagia 2 (Fine chop);Thin liquid   Liquid Administration via: Cup;Straw Medication Administration: Whole meds with liquid Compensations: Minimize environmental distractions;Slow rate;Small sips/bites Postural Changes: Seated upright at 90 degrees;Remain upright for at least 30 minutes after po intake    Other  Recommendations Oral Care Recommendations: Oral care BID   Follow up Recommendations 24 hour supervision/assistance;Skilled Nursing facility      Frequency and Duration min 1 x/week  1 week       Prognosis Prognosis for Safe Diet Advancement: Fair Barriers to Reach Goals: Cognitive deficits      Swallow Study   General Date of Onset: 06/02/2020 HPI: Aubrey Voong  is a 64 y.o. male,  with a history of DM 2, HTN, ongoing heavy smoking and COPD, previous EtOH abuse.  Other history includes multivessel coronary artery disease, severe aortic stenosis with insufficiency, mitral valve insufficiency, status post CABG x6, status post aortic valve replacement, status post mitral valve annuloplasty, acute pulmonary edema, acute systolic heart failure, history of depression, postoperative atrial fibrillation, hyperglycemia. Type of Study: Bedside Swallow Evaluation Previous Swallow Assessment: none  Diet Prior to this Study: NPO Temperature Spikes Noted: No Respiratory Status: Room air History of Recent Intubation: No Behavior/Cognition: Alert;Cooperative;Pleasant mood Oral Cavity Assessment: Dry Oral Care Completed by SLP: Recent completion by staff Oral Cavity - Dentition: Edentulous Vision: Functional for self-feeding Self-Feeding Abilities: Able to feed self;Needs assist Patient Positioning: Upright in  bed Baseline Vocal Quality: Normal Volitional Cough: Strong Volitional Swallow: Able to elicit    Oral/Motor/Sensory Function Overall Oral Motor/Sensory Function: Within functional limits   Ice Chips Ice chips: Within functional limits   Thin Liquid Thin Liquid: Within functional limits Presentation: Spoon;Cup;Straw    Nectar Thick Nectar Thick Liquid: Not tested   Honey Thick Honey Thick Liquid: Not tested   Puree Puree: Within functional limits   Solid     Solid: Within functional limits     Mylin Hirano H. Romie Levee, CCC-SLP Speech Language Pathologist  Georgetta Haber 06/12/2020,10:46 AM

## 2020-06-12 NOTE — Progress Notes (Signed)
SLP Cancellation Note  Patient Details Name: Scott Rios MRN: [id number] DOB: 1956-07-26   Cancelled treatment:        SLP completed Clinical swallowing evaluation and was initiating SLE when Pt became unresponsive; SLP immediately pressed call light to summon RN and report unresponsiveness. Acknowledge SLE however, will hold on completing SLE until Pt is appropriate.  Also SLP will not initiate diet secondary to this new episode. ST will re-evaluate if indicated and will continue to follow for goals of care. Thank you,  Telesha Deguzman H. Romie Levee, CCC-SLP Speech Language Pathologist   Georgetta Haber 06/12/2020, 11:41 AM

## 2020-06-12 NOTE — Progress Notes (Addendum)
Summoned to patient room due to patient slumped over and unresponsive during consult with speech therapy. Patient not rousable to sternal rub. CBG 140 and vitals WNL. Patient became rousable and was able to speak with RN but not able to recall name or answer questions. Rapid response notified. MD present and aware.

## 2020-06-12 NOTE — Care Management Important Message (Signed)
Important Message  Patient Details  Name: Scott Rios MRN: [id number] Date of Birth: Jun 30, 1956   Medicare Important Message Given:  Yes - Important Message mailed due to current National Emergency     Corey Harold 06/12/2020, 5:11 PM

## 2020-06-12 NOTE — Progress Notes (Signed)
TR[facility] night shift.  The staff reports that the patient has gone from variable rate atrial flutter to 4:1 atrial flutter rhythm with a ventricular rate in the 70s.  He is on warfarin with an INR at 1.8 at this time.  He seems to be in no distress at this time.  We will continue to monitor.  Sanda Klein, MD.

## 2020-06-12 NOTE — Progress Notes (Signed)
Inpatient Diabetes Program Recommendations  AACE/ADA: New Consensus Statement on Inpatient Glycemic Control (2015)  Target Ranges:  Prepandial:   less than 140 mg/dL      Peak postprandial:   less than 180 mg/dL (1-2 hours)      Critically ill patients:  140 - 180 mg/dL   Lab Results  Component Value Date   GLUCAP 97 06/12/2020   HGBA1C 11.7 (H) June 28, 2020    Review of Glycemic Control  Diabetes history: DM 2 Outpatient Diabetes medications: Metformin 500 mg bid Current orders for Inpatient glycemic control:  Levemir 5 units Daily Novolog 0-15 Q4 hours  Inpatient Diabetes Program Recommendations:    Note DM coordinator spoke with pt last admission for education. A1c higher this admission at 11.7%. MD was advised to start not only metformin but also Glipizide. Pt only d/c'd on metformin.  Thanks,  Christena Deem RN, MSN, BC-ADM Inpatient Diabetes Coordinator Team Pager [phone] (8a-5p)

## 2020-06-12 NOTE — Progress Notes (Signed)
Patient Demographics:    Rowland Ericsson, is a 64 y.o. male, DOB - [birth year], BVQ:[id number]  Admit date - 06/03/2020   Admitting Physician Albertine Patricia, MD  Outpatient Primary MD for the patient is Redmond School, MD  LOS - 1   Chief Complaint  Patient presents with  . Altered Mental Status        Subjective:    Oney Folz today has no fevers, no emesis,  No chest pain,   --Recurrent episodes of altered mentation and unresponsiveness -Patient son and sister at bedside, questions answered -Lethargy persist patient remains a poor historian --Rapid response was called earlier due to episodes of unresponsiveness  Assessment  & Plan :    Principal Problem:   Acute CVA (cerebrovascular accident) / Septic Emboli Vs Afib Related CVAs Active Problems:   S/P CABG x 6   CAD (coronary artery disease)   S/P mitral valve replacement   Atrial fibrillation (HCC)   Sepsis due to Enterococcus-Enterococcus faecalis bacteremia and sepsis   Tobacco abuse   Mitral regurgitation   Malnutrition of moderate degree   Status post aortic valve replacement with bioprosthetic valve   Current use of long term anticoagulation   Enterococcus Fecalis bacteremia  Brief Summary:-  64 y.o. male, with a history of DM 2, HTN, ongoing heavy smoking and COPD, previous EtOH abuse. Other history includes multivessel coronary artery disease, severe aortic stenosis with insufficiency, mitral valve insufficiency, status post CABG x6, status post aortic valve replacement, status post mitral valve annuloplasty, acute pulmonary edema, acute systolic heart failure, history of depression, postoperative atrial fibrillation admitted on 06/11/2028 with acute metabolic encephalopathy due to acute strokes and Enterococcus bacteremia/sepsis  A/p 1) acute CVAs -MRI brain and MRA head--- with Numerous widely scattered acute infarctions in  the cerebellum and both cerebral hemispheres consistent with embolic disease from the heart or ascending aorta. 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 fib anticoagulation can be continued -Discussed with on-call infectious disease physician Dr. Gale Journey -Please keep n.p.o. on Sunday night for possibility of TEE on Monday, 06/15/2020 -Continue Lipitor 80 mg daily and Coumadin therapy -Neurology consult requested -PT eval appreciated, commence SNF rehab -Speech eval when patient is more awake  2) Enterococcus faecalis bacteremia and sepsis --- blood cultures from 06/07/2020 noted, --continue IV Vancomycin and Rocephin TTE with EF of 55 to 60% -Echo on 06/12/20 demonstrates evidence of aortic and mitral valve replacements with no evidence of mitral stenosis or aortic stenosis -Patient needs TEE as above to rule out endocarditis especially in the setting of bioprosthetic valves --Discussed with on-call infectious disease physician Dr. Gale Journey -Please note that patient did met sepsis criteria on admission with fever of 102.5 heart rate of 114 respiratory rate of 28 and altered mentation in the setting of Enterococcus faecalis bacteremia  3)Social/Ethics--family conference with patient's son Broadus John and patient's sister Armstead Peaks at bedside-- -They will  talk to the rest of the family including patient's 2 older sons--prior to making any decisions on CODE STATUS -Patient remains full code at this time  4) acute metabolic encephalopathy--- due to #1 and #2 above  5)HFpEF--history of combined diastolic  and systolic dysfunction CHF repeat echo on 06/12/2020 with EF of 55 to 60% which is higher than patient's prior EF -Continue to hold torsemide -Needs IV fluids/hydration for sepsis and n.p.o. status, be judicious with IV fluids given underlying CHF  6)History  of CAD status post recent CABG x6 and 04/09/2020 --- no ACS type symptoms --Continue Lipitor, switch metoprolol to IV as patient is n.p.o.  7) chronic atrial fibrillation--on Coumadin for anticoagulation for presumed valvular A. Fib, pharmacy to help manage Coumadin therapy -Patient has history of eractic INRs  8) status post recent aortic valve and mitral valve replacements---  Status postAortic valve replacement using a 23 mm Edwards INSPIRIS RESILIA pericardial valve,  - Status post mitral valve repair with 28 mm Sorin, Memo 4D annuloplasty ring, clipping of left atrial appendage -Echo on 06/12/20 demonstrates evidence of aortic and mitral valve replacements with no evidence of mitral stenosis or aortic stenosis  9)DM2-A1c is 11.7 reflecting uncontrolled DM with hyperglycemia -Currently on Lantus 5 units daily Use Novolog/Humalog Sliding scale insulin with Accu-Cheks/Fingersticks as ordered -Hold Metformin  -Avoid over aggressive control at this time as patient is n.p.o. due to altered mentation and acute strokes   Disposition/Need for in-Hospital Stay- patient unable to be discharged at this time due to --- acute CVAs and enterococcal sepsis requiring IV antibiotics and further stroke work-up  Status is: Inpatient  Remains inpatient appropriate because:acute CVAs and enterococcal sepsis requiring IV antibiotics and further stroke work-up   Disposition: The patient is from: Home              Anticipated d/c is to: SNF              Anticipated d/c date is: > 3 days              Patient currently is not medically stable to d/c. Barriers: Not Clinically Stable- acute CVAs and enterococcal sepsis requiring IV antibiotics and  further stroke work-up  Code Status : full code  Family Communication:Discussed with son and sister at bedside Consults  :  ID/Cardiology/Neurology  DVT Prophylaxis  : Coumadin therapy  Lab Results  Component Value Date   PLT 175 06/12/2020    Inpatient Medications  Scheduled Meds: .  stroke: mapping our early stages of recovery book   Does not apply Once  . atorvastatin  80 mg Oral Daily  . insulin aspart  0-15 Units Subcutaneous Q4H  . insulin detemir  5 Units Subcutaneous Daily  . metoprolol tartrate  2.5 mg Intravenous Q8H  . multivitamin with minerals  1 tablet Oral Daily  . tamsulosin  0.4 mg Oral Daily  . warfarin  1 mg Oral Once  . warfarin  1 mg Oral ONCE-1600  . Warfarin - Pharmacist Dosing Inpatient   Does not apply q1600   Continuous Infusions: . sodium chloride    . cefTRIAXone (ROCEPHIN)  IV 2 g (06/12/20 1436)  . vancomycin 750 mg (06/12/20 1549)   PRN Meds:.acetaminophen **OR** acetaminophen (TYLENOL) oral liquid 160 mg/5 mL **OR** acetaminophen, dextrose    Anti-infectives (From admission, onward)   Start     Dose/Rate Route Frequency Ordered Stop   06/12/20 1800  vancomycin (VANCOREADY) IVPB 750 mg/150 mL  Status:  Discontinued  750 mg 150 mL/hr over 60 Minutes Intravenous Every 24 hours 06/14/2020 1647 06/05/2020 1742   06/12/20 1800  vancomycin (VANCOREADY) IVPB 750 mg/150 mL  Status:  Discontinued        750 mg 150 mL/hr over 60 Minutes Intravenous Every 24 hours 06/12/20 0820 06/12/20 1202   06/12/20 1300  vancomycin (VANCOREADY) IVPB 750 mg/150 mL        750 mg 150 mL/hr over 60 Minutes Intravenous Every 12 hours 06/12/20 1202     06/12/20 1200  ampicillin (OMNIPEN) 2 g in sodium chloride 0.9 % 100 mL IVPB  Status:  Discontinued        2 g 300 mL/hr over 20 Minutes Intravenous Every 4 hours 06/12/20 1102 06/12/20 1130   06/12/20 1200  cefTRIAXone (ROCEPHIN) 2 g in sodium chloride 0.9 % 100 mL IVPB        2 g 200 mL/hr over 30 Minutes  Intravenous Every 12 hours 06/12/20 1102     06/12/20 0100  aztreonam (AZACTAM) 2 g in sodium chloride 0.9 % 100 mL IVPB  Status:  Discontinued        2 g 200 mL/hr over 30 Minutes Intravenous Every 8 hours 06/01/2020 1647 05/17/2020 1742   06/14/2020 1700  vancomycin (VANCOREADY) IVPB 1500 mg/300 mL        1,500 mg 150 mL/hr over 120 Minutes Intravenous  Once 05/20/2020 1645 05/31/2020 2114   05/28/2020 1530  aztreonam (AZACTAM) 2 g in sodium chloride 0.9 % 100 mL IVPB        2 g 200 mL/hr over 30 Minutes Intravenous  Once 06/05/2020 1521 06/10/2020 1750   06/01/2020 1530  metroNIDAZOLE (FLAGYL) IVPB 500 mg        500 mg 100 mL/hr over 60 Minutes Intravenous  Once 06/09/2020 1521 06/07/2020 1719   05/16/2020 1530  vancomycin (VANCOCIN) IVPB 1000 mg/200 mL premix  Status:  Discontinued        1,000 mg 200 mL/hr over 60 Minutes Intravenous  Once 05/21/2020 1521 06/10/2020 1644        Objective:   Vitals:   06/12/20 0830 06/12/20 0914 06/12/20 1200 06/12/20 1609  BP: 129/72 138/79 120/87 106/68  Pulse: 92 86 76 89  Resp:  16  18  Temp:  98.2 F (36.8 C) 98.6 F (37 C) 99.5 F (37.5 C)  TempSrc:  Oral  Axillary  SpO2: 97% 94% 99% 99%  Weight:  57.8 kg    Height:  $Remove'5\' 7"'tNwrBkc$  (1.702 m)      Wt Readings from Last 3 Encounters:  06/12/20 57.8 kg  05/13/20 60.3 kg  04/24/20 67 kg     Intake/Output Summary (Last 24 hours) at 06/12/2020 1814 Last data filed at 06/12/2020 0337 Gross per 24 hour  Intake 910.08 ml  Output --  Net 910.08 ml    Physical Exam  Gen:-Lethargic, episodes of unresponsiveness HEENT:- [place].AT, No sclera icterus Neck-Supple Neck,No JVD,.  Lungs-diminished breath sounds, no wheezing  CV- S1, S2 normal, regular, healing sternotomy scar Abd-  +ve B.Sounds, Abd Soft, No tenderness,    Extremity/Skin:- No  edema, pedal pulses present  Psych-affect is flat, patient is disoriented, lethargic, Neuro-New speech deficits, cognitive and memory concerns   Data Review:   Micro  Results Recent Results (from the past 240 hour(s))  Respiratory Panel by RT PCR (Flu A&B, Covid) - Nasopharyngeal Swab     Status: None   Collection Time: 06/08/2020  1:36 PM   Specimen: Nasopharyngeal Swab  Result  Value Ref Range Status   SARS Coronavirus 2 by RT PCR NEGATIVE NEGATIVE Final    Comment: (NOTE) SARS-CoV-2 target nucleic acids are NOT DETECTED.  The SARS-CoV-2 RNA is generally detectable in upper respiratoy specimens during the acute phase of infection. The lowest concentration of SARS-CoV-2 viral copies this assay can detect is 131 copies/mL. A negative result does not preclude SARS-Cov-2 infection and should not be used as the sole basis for treatment or other patient management decisions. A negative result may occur with  improper specimen collection/handling, submission of specimen other than nasopharyngeal swab, presence of viral mutation(s) within the areas targeted by this assay, and inadequate number of viral copies (<131 copies/mL). A negative result must be combined with clinical observations, patient history, and epidemiological information. The expected result is Negative.  Fact Sheet for Patients:  PinkCheek.be  Fact Sheet for Healthcare Providers:  GravelBags.it  This test is no t yet approved or cleared by the [place] FDA and  has been authorized for detection and/or diagnosis of SARS-CoV-2 by FDA under an Emergency Use Authorization (EUA). This EUA will remain  in effect (meaning this test can be used) for the duration of the COVID-19 declaration under Section 564(b)(1) of the Act, 21 U.S.C. section 360bbb-3(b)(1), unless the authorization is terminated or revoked sooner.     Influenza A by PCR NEGATIVE NEGATIVE Final   Influenza B by PCR NEGATIVE NEGATIVE Final    Comment: (NOTE) The Xpert Xpress SARS-CoV-2/FLU/RSV assay is intended as an aid in  the diagnosis of influenza from  Nasopharyngeal swab specimens and  should not be used as a sole basis for treatment. Nasal washings and  aspirates are unacceptable for Xpert Xpress SARS-CoV-2/FLU/RSV  testing.  Fact Sheet for Patients: PinkCheek.be  Fact Sheet for Healthcare Providers: GravelBags.it  This test is not yet approved or cleared by the [place] FDA and  has been authorized for detection and/or diagnosis of SARS-CoV-2 by  FDA under an Emergency Use Authorization (EUA). This EUA will remain  in effect (meaning this test can be used) for the duration of the  Covid-19 declaration under Section 564(b)(1) of the Act, 21  U.S.C. section 360bbb-3(b)(1), unless the authorization is  terminated or revoked. Performed at [facility], [address], [place], [place] [address]   Culture, blood (routine x 2)     Status: None (Preliminary result)   Collection Time: 05/29/2020  2:22 PM   Specimen: BLOOD LEFT FOREARM  Result Value Ref Range Status   Specimen Description   Final    BLOOD LEFT FOREARM Performed at [facility], [place] [address] [place], [place] [address]    Special Requests   Final    NONE Performed at [facility], [address] [place], [place] [address]    Culture  Setup Time   Final    IN BOTH AEROBIC AND ANAEROBIC BOTTLES GRAM POSITIVE COCCI Gram Stain Report Called to,Read Back By and Verified With: C TURNER,[email or web address]$  06/12/20 MKELLY    Culture   Final    NO GROWTH < 24 HOURS Performed at [facility], [address], [place], [place] [address]    Report Status PENDING  Incomplete  Blood Culture ID Panel (Reflexed)     Status: Abnormal   Collection Time: 06/14/2020  2:22 PM  Result Value Ref Range Status   Enterococcus faecalis DETECTED (A) NOT DETECTED Final    Comment: CRITICAL RESULT CALLED TO, READ BACK BY AND VERIFIED WITH: G. Coffee PharmD 8:20  06/12/20 ([email or web address])    Enterococcus Faecium NOT DETECTED NOT DETECTED Final    Listeria monocytogenes NOT DETECTED NOT DETECTED Final   Staphylococcus species NOT DETECTED NOT DETECTED Final   Staphylococcus aureus (BCID) NOT DETECTED NOT DETECTED Final   Staphylococcus epidermidis NOT DETECTED NOT DETECTED Final   Staphylococcus lugdunensis NOT DETECTED NOT DETECTED Final   Streptococcus species NOT DETECTED NOT DETECTED Final   Streptococcus agalactiae NOT DETECTED NOT DETECTED Final   Streptococcus pneumoniae NOT DETECTED NOT DETECTED Final   Streptococcus pyogenes NOT DETECTED NOT DETECTED Final   A.calcoaceticus-baumannii NOT DETECTED NOT DETECTED Final   Bacteroides fragilis NOT DETECTED NOT DETECTED Final   Enterobacterales NOT DETECTED NOT DETECTED Final   Enterobacter cloacae complex NOT DETECTED NOT DETECTED Final   Escherichia coli NOT DETECTED NOT DETECTED Final   Klebsiella aerogenes NOT DETECTED NOT DETECTED Final   Klebsiella oxytoca NOT DETECTED NOT DETECTED Final   Klebsiella pneumoniae NOT DETECTED NOT DETECTED Final   Proteus species NOT DETECTED NOT DETECTED Final   Salmonella species NOT DETECTED NOT DETECTED Final   Serratia marcescens NOT DETECTED NOT DETECTED Final   Haemophilus influenzae NOT DETECTED NOT DETECTED Final   Neisseria meningitidis NOT DETECTED NOT DETECTED Final   Pseudomonas aeruginosa NOT DETECTED NOT DETECTED Final   Stenotrophomonas maltophilia NOT DETECTED NOT DETECTED Final   Candida albicans NOT DETECTED NOT DETECTED Final   Candida auris NOT DETECTED NOT DETECTED Final   Candida glabrata NOT DETECTED NOT DETECTED Final   Candida krusei NOT DETECTED NOT DETECTED Final   Candida parapsilosis NOT DETECTED NOT DETECTED Final   Candida tropicalis NOT DETECTED NOT DETECTED Final   Cryptococcus neoformans/gattii NOT DETECTED NOT DETECTED Final   Vancomycin resistance NOT DETECTED NOT DETECTED Final    Comment: Performed at [facility], [address], [place], [place] [address]  Culture, blood (routine x 2)      Status: None (Preliminary result)   Collection Time: 05/28/2020  2:26 PM   Specimen: BLOOD LEFT HAND  Result Value Ref Range Status   Specimen Description   Final    BLOOD LEFT HAND Performed at [facility], [place] [address], [place], [place] [address]    Special Requests NONE  Final   Culture  Setup Time   Final    IN BOTH AEROBIC AND ANAEROBIC BOTTLES GRAM POSITIVE COCCI Gram Stain Report Called to,Read Back By and Verified With: C TURNER,[email or web address]$  06/12/20 MKELLY    Culture   Final    NO GROWTH < 24 HOURS Performed at [facility], [address], [place], [place] [address]    Report Status PENDING  Incomplete    Radiology Reports CT Head Wo Contrast  Result Date: 05/23/2020 CLINICAL DATA:  Altered mental status. EXAM: CT HEAD WITHOUT CONTRAST TECHNIQUE: Contiguous axial images were obtained from the base of the skull through the vertex without intravenous contrast. COMPARISON:  April 03, 2020. FINDINGS: Brain: New low density is seen in right periventricular white matter concerning for infarction of indeterminate age. No hemorrhage or mass lesion is noted. No midline shift is noted. Ventricular size is within normal limits. Vascular: No hyperdense vessel or unexpected calcification. Skull: Normal. Negative for fracture or focal lesion. Sinuses/Orbits: No acute finding. Other: None. IMPRESSION: New low density is seen in right periventricular white matter concerning for infarction of indeterminate age. MRI is recommended for further evaluation. Electronically Signed   By: Marijo Conception M.D.   On: 06/12/2020 15:30  MR ANGIO HEAD WO CONTRAST  Result Date: 05/15/2020 CLINICAL DATA:  Follow-up stroke.  Altered mental status. EXAM: MRI HEAD WITHOUT CONTRAST MRA HEAD WITHOUT CONTRAST TECHNIQUE: Multiplanar, multiecho pulse sequences of the brain and surrounding structures were obtained without intravenous contrast. Angiographic images of the head were obtained using MRA technique  without contrast. COMPARISON:  Head CT same day.  Head CT 04/03/2020.  MRI 03/28/2020. FINDINGS: MRI HEAD FINDINGS Brain: Diffusion imaging shows numerous widely scattered acute infarctions consistent with embolic disease from the heart or ascending aorta. Scattered punctate infarctions are present within the cerebellum. Right cerebral hemisphere shows small infarctions in the frontal and parietal white matter, with a larger, 1.5 cm acute infarction in the right posterior frontal cortical and subcortical brain. On the left, there are more numerous punctate infarctions in the frontal and parietal lobes, with several areas of cortical involvement in the parietal region. No evidence of swelling, hemorrhage or mass effect. Elsewhere, there chronic small-vessel ischemic changes of the pons and cerebral hemispheric white matter. No hydrocephalus or extra-axial fluid collection. Vascular: Major vessels at the base of the brain show flow. Skull and upper cervical spine: Negative Sinuses/Orbits: Clear/normal Other: None MRA HEAD FINDINGS Both internal carotid arteries are patent through the skull base and siphon regions. The anterior and middle cerebral vessels are patent without discernible large or medium vessel occlusion. No correctable proximal stenosis. 30% narrowing of the right M1 segment. Both vertebral arteries are patent to the basilar. No basilar stenosis. Posterior circulation branch vessels are patent. IMPRESSION: 1. Numerous widely scattered acute infarctions in the cerebellum and both cerebral hemispheres consistent with embolic disease from the heart or ascending aorta. Largest, 1.5 cm acute infarction is in the right posterior frontal cortical and subcortical brain. No evidence of swelling, hemorrhage or mass effect. 2. MR angiography does not show any large or medium vessel occlusion or correctable proximal stenosis. 30% narrowing of the right M1 segment. Electronically Signed   By: Nelson Chimes M.D.   On:  06/14/2020 18:59   MR BRAIN WO CONTRAST  Result Date: 05/19/2020 CLINICAL DATA:  Follow-up stroke.  Altered mental status. EXAM: MRI HEAD WITHOUT CONTRAST MRA HEAD WITHOUT CONTRAST TECHNIQUE: Multiplanar, multiecho pulse sequences of the brain and surrounding structures were obtained without intravenous contrast. Angiographic images of the head were obtained using MRA technique without contrast. COMPARISON:  Head CT same day.  Head CT 04/03/2020.  MRI 03/28/2020. FINDINGS: MRI HEAD FINDINGS Brain: Diffusion imaging shows numerous widely scattered acute infarctions consistent with embolic disease from the heart or ascending aorta. Scattered punctate infarctions are present within the cerebellum. Right cerebral hemisphere shows small infarctions in the frontal and parietal white matter, with a larger, 1.5 cm acute infarction in the right posterior frontal cortical and subcortical brain. On the left, there are more numerous punctate infarctions in the frontal and parietal lobes, with several areas of cortical involvement in the parietal region. No evidence of swelling, hemorrhage or mass effect. Elsewhere, there chronic small-vessel ischemic changes of the pons and cerebral hemispheric white matter. No hydrocephalus or extra-axial fluid collection. Vascular: Major vessels at the base of the brain show flow. Skull and upper cervical spine: Negative Sinuses/Orbits: Clear/normal Other: None MRA HEAD FINDINGS Both internal carotid arteries are patent through the skull base and siphon regions. The anterior and middle cerebral vessels are patent without discernible large or medium vessel occlusion. No correctable proximal stenosis. 30% narrowing of the right M1 segment. Both vertebral arteries are patent to the basilar.  No basilar stenosis. Posterior circulation branch vessels are patent. IMPRESSION: 1. Numerous widely scattered acute infarctions in the cerebellum and both cerebral hemispheres consistent with embolic  disease from the heart or ascending aorta. Largest, 1.5 cm acute infarction is in the right posterior frontal cortical and subcortical brain. No evidence of swelling, hemorrhage or mass effect. 2. MR angiography does not show any large or medium vessel occlusion or correctable proximal stenosis. 30% narrowing of the right M1 segment. Electronically Signed   By: Nelson Chimes M.D.   On: 05/21/2020 18:59   US Carotid Bilateral (at [facility] and [facility] only)  Result Date: 06/12/2020 CLINICAL DATA:  CVA. EXAM: BILATERAL CAROTID DUPLEX ULTRASOUND TECHNIQUE: Pearline Cables scale imaging, color Doppler and duplex ultrasound were performed of bilateral carotid and vertebral arteries in the neck. COMPARISON:  MRI brain 06/03/2020 FINDINGS: Criteria: Quantification of carotid stenosis is based on velocity parameters that correlate the residual internal carotid diameter with NASCET-based stenosis levels, using the diameter of the distal internal carotid lumen as the denominator for stenosis measurement. The following velocity measurements were obtained: RIGHT ICA: 93/23 cm/sec CCA: 762/83 cm/sec SYSTOLIC ICA/CCA RATIO:  0.9 ECA: 156 cm/sec LEFT ICA: 97/34 cm/sec CCA: 151/76 cm/sec SYSTOLIC ICA/CCA RATIO:  0.8 ECA: 120 cm/sec RIGHT CAROTID ARTERY: Heterogeneous plaque in the right common carotid artery. Small amount of echogenic plaque at the carotid bulb. External carotid artery is patent with normal waveform. Small amount of plaque in the proximal internal carotid artery. Normal waveforms and velocities in the internal carotid artery. RIGHT VERTEBRAL ARTERY: Antegrade flow and normal waveform in the right vertebral artery. LEFT CAROTID ARTERY: Heterogeneous and echogenic plaque in the left common carotid artery. Echogenic plaque at the left carotid bulb. External carotid artery is patent with normal waveform. Plaque in the left internal carotid artery. Normal waveforms and velocities in the internal carotid artery. LEFT VERTEBRAL ARTERY:  Antegrade flow and normal waveform in the left vertebral artery. IMPRESSION: 1. Atherosclerotic disease involving bilateral carotid arteries. Estimated degree of stenosis in the internal carotid arteries is less than 50% bilaterally. 2. Patent vertebral arteries with antegrade flow. Electronically Signed   By: Markus Daft M.D.   On: 06/12/2020 11:58   DG Chest Port 1 View  Result Date: 06/04/2020 CLINICAL DATA:  Decreased eating and drinking. History of heart surgery 2 months ago. EXAM: PORTABLE CHEST 1 VIEW COMPARISON:  Chest radiograph dated 05/13/2020 FINDINGS: The heart size and mediastinal contours are within normal limits. Both lungs are clear. The visualized skeletal structures are unremarkable. Median sternotomy wires, cardiac valve prosthesis, and atrial appendage clip are redemonstrated. IMPRESSION: No active disease. Electronically Signed   By: Zerita Boers M.D.   On: 06/03/2020 15:19   ECHOCARDIOGRAM COMPLETE  Result Date: 06/12/2020    ECHOCARDIOGRAM REPORT   Patient Name:   BRAHEEM TOMASIK Date of Exam: 06/12/2020 Medical Rec #:  [id number]      Height:       67.0 in Accession #:    [id number]     Weight:       127.4 lb Date of Birth:  07/27/1956      BSA:          1.670 m Patient Age:    76 years       BP:           108/61 mmHg Patient Gender: M              HR:  87 bpm. Exam Location:  [facility] Procedure: 2D Echo, Color Doppler and Cardiac Doppler Indications:    Stroke 434.91 / I163.9  History:        Patient has prior history of Echocardiogram examinations, most                 recent 06/05/2020. Risk Factors:Hypertension and Diabetes.                 Aortic Valve: 23 mm valve is present in the aortic position.                 Procedure Date: 04/09/2020.  Sonographer:    Bernadene Person RDCS Referring Phys: [place]  1. Left ventricular ejection fraction, by estimation, is 55 to 60%. The left ventricle has normal function. Left ventricular endocardial  border not optimally defined to evaluate regional wall motion. Left ventricular diastolic parameters are indeterminate.  2. Right ventricular systolic function is normal. The right ventricular size is normal.  3. Left atrial size was severely dilated.  4. 28 mm Sorin, Memo 4D annuloplasty ring at the MV anulus. Mild gradient across the MV anulus, mean gradient 5 mmHg. . The mitral valve has been repaired/replaced. No evidence of mitral valve regurgitation. No evidence of mitral stenosis. Severe mitral  annular calcification.  5. 23 mm Edwards INSPIRIS RESILIA pericardial valve at the AV position. . The aortic valve has been repaired/replaced. There is severe calcifcation of the aortic valve. There is severe thickening of the aortic valve. Aortic valve regurgitation is not visualized. No aortic stenosis is present. There is a 23 mm valve present in the aortic position. Procedure Date: 04/09/2020.  6. The inferior vena cava is normal in size with greater than 50% respiratory variability, suggesting right atrial pressure of 3 mmHg. FINDINGS  Left Ventricle: Left ventricular ejection fraction, by estimation, is 55 to 60%. The left ventricle has normal function. Left ventricular endocardial border not optimally defined to evaluate regional wall motion. The left ventricular internal cavity size was normal in size. There is no left ventricular hypertrophy. Left ventricular diastolic parameters are indeterminate. Right Ventricle: The right ventricular size is normal. No increase in right ventricular wall thickness. Right ventricular systolic function is normal. Left Atrium: Left atrial size was severely dilated. Right Atrium: Right atrial size was normal in size. Pericardium: There is no evidence of pericardial effusion. Mitral Valve: 28 mm Sorin, Memo 4D annuloplasty ring at the MV anulus. Mild gradient across the MV anulus, mean gradient 5 mmHg. The mitral valve has been repaired/replaced. There is mild thickening of the  mitral valve leaflet(s). There is mild calcification of the mitral valve leaflet(s). Severe mitral annular calcification. No evidence of mitral valve regurgitation. No evidence of mitral valve stenosis. The mean mitral valve gradient is 5.2 mmHg with average heart rate of 96 bpm. Tricuspid Valve: The tricuspid valve is normal in structure. Tricuspid valve regurgitation is not demonstrated. No evidence of tricuspid stenosis. Aortic Valve: 23 mm Edwards INSPIRIS RESILIA pericardial valve at the AV position. The aortic valve has been repaired/replaced. There is severe calcifcation of the aortic valve. There is severe thickening of the aortic valve. There is severe aortic valve  annular calcification. Aortic valve regurgitation is not visualized. No aortic stenosis is present. Aortic valve mean gradient measures 11.4 mmHg. Aortic valve peak gradient measures 19.4 mmHg. Aortic valve area, by VTI measures 2.23 cm. There is a 23 mm valve present in the aortic position. Procedure Date:  04/09/2020. Pulmonic Valve: The pulmonic valve was not well visualized. Pulmonic valve regurgitation is not visualized. No evidence of pulmonic stenosis. Aorta: The aortic root is normal in size and structure. Pulmonary Artery: Indeterminant PASP, inadequate TR jet. Venous: The inferior vena cava is normal in size with greater than 50% respiratory variability, suggesting right atrial pressure of 3 mmHg. IAS/Shunts: No atrial level shunt detected by color flow Doppler.  LEFT VENTRICLE PLAX 2D LVIDd:         4.30 cm  Diastology LVIDs:         2.99 cm  LV e' medial:    3.00 cm/s LV PW:         0.95 cm  LV E/e' medial:  50.3 LV IVS:        0.79 cm  LV e' lateral:   8.11 cm/s LVOT diam:     2.30 cm  LV E/e' lateral: 18.6 LV SV:         77 LV SV Index:   46 LVOT Area:     4.15 cm  RIGHT VENTRICLE RV S prime:     10.00 cm/s TAPSE (M-mode): 1.7 cm LEFT ATRIUM           Index       RIGHT ATRIUM           Index LA diam:      3.90 cm 2.34 cm/m  RA  Area:     15.00 cm LA Vol (A2C): 26.0 ml 15.57 ml/m RA Volume:   35.80 ml  21.44 ml/m LA Vol (A4C): 76.9 ml 46.06 ml/m  AORTIC VALVE AV Area (Vmax):    2.25 cm AV Area (Vmean):   2.17 cm AV Area (VTI):     2.23 cm AV Vmax:           220.18 cm/s AV Vmean:          154.037 cm/s AV VTI:            0.343 m AV Peak Grad:      19.4 mmHg AV Mean Grad:      11.4 mmHg LVOT Vmax:         119.27 cm/s LVOT Vmean:        80.595 cm/s LVOT VTI:          0.185 m LVOT/AV VTI ratio: 0.54  AORTA Ao Root diam: 2.90 cm Ao Asc diam:  3.90 cm MITRAL VALVE MV Area (PHT): 2.26 cm     SHUNTS MV Mean grad:  5.2 mmHg     Systemic VTI:  0.18 m MV Decel Time: 335 msec     Systemic Diam: 2.30 cm MV E velocity: 151.00 cm/s MV A velocity: 56.80 cm/s MV E/A ratio:  2.66 Carlyle Dolly MD Electronically signed by Carlyle Dolly MD Signature Date/Time: 06/12/2020/4:40:14 PM    Final    ECHOCARDIOGRAM COMPLETE  Result Date: 06/05/2020    ECHOCARDIOGRAM REPORT   Patient Name:   WATT GEILER Date of Exam: 06/03/2020 Medical Rec #:  [id number]      Height:       67.0 in Accession #:    [id number]     Weight:       133.0 lb Date of Birth:  1956-06-05      BSA:          1.700 m Patient Age:    11 years       BP:  119/72 mmHg Patient Gender: M              HR:           105 bpm. Exam Location:  [facility] Procedure: 2D Echo, Cardiac Doppler and Color Doppler Indications:    I35.9 AVD, I05.9 MVD  History:        Patient has prior history of Echocardiogram examinations, most                 recent 03/31/2020. CHF, CAD, Prior CABG, Aortic Valve Disease and                 Mitral Valve Disease; Risk Factors:Former Smoker, Hypertension,                 Diabetes and Dyslipidemia.                  Mitral Valve: 28 mm Sorin, Memo 4D annuloplasty ring valve is                 present in the mitral position. Procedure Date: 04/09/2020.  Sonographer:    Jeneen Montgomery Referring Phys: [place]  1. Since the last study on  03/31/2020 the patient is post AVR and MVR, CABG. There is only trivial residual mitral and aortic regurgitation. LVEF has improved from 35-40% to 55-60%.  2. Left ventricular ejection fraction, by estimation, is 55 to 60%. The left ventricle has normal function. The left ventricle has no regional wall motion abnormalities. Left ventricular diastolic function could not be evaluated. Elevated left atrial pressure.  3. Right ventricular systolic function is normal. The right ventricular size is normal.  4. Left atrial size was mildly dilated.  5. The mitral valve has been repaired/replaced. Trivial mitral valve regurgitation. No evidence of mitral stenosis. There is a 28 mm Sorin, Memo 4D annuloplasty ring present in the mitral position. Procedure Date: 04/09/2020.  6. The aortic valve has been repaired/replaced. Aortic valve regurgitation is trivial. No aortic stenosis is present. Procedure Date: 04/09/2020. Aortic valve mean gradient measures 14.0 mmHg.  7. The inferior vena cava is normal in size with greater than 50% respiratory variability, suggesting right atrial pressure of 3 mmHg. Comparison(s): Echocardiogram done 03/31/20 showed an EF of 35-40% with severe AR and moderate to severe MR. FINDINGS  Left Ventricle: Left ventricular ejection fraction, by estimation, is 55 to 60%. The left ventricle has normal function. The left ventricle has no regional wall motion abnormalities. The left ventricular internal cavity size was normal in size. There is  no left ventricular hypertrophy. Abnormal (paradoxical) septal motion consistent with post-operative status. Left ventricular diastolic function could not be evaluated due to atrial fibrillation. Left ventricular diastolic function could not be evaluated. Elevated left atrial pressure. Right Ventricle: The right ventricular size is normal. No increase in right ventricular wall thickness. Right ventricular systolic function is normal. Left Atrium: Left atrial size was  mildly dilated. Right Atrium: Right atrial size was normal in size. Pericardium: There is no evidence of pericardial effusion. Mitral Valve: The mitral valve has been repaired/replaced. Trivial mitral valve regurgitation. There is a 28 mm Sorin, Memo 4D annuloplasty ring present in the mitral position. Procedure Date: 04/09/2020. No evidence of mitral valve stenosis. Tricuspid Valve: The tricuspid valve is normal in structure. Tricuspid valve regurgitation is mild . No evidence of tricuspid stenosis. Aortic Valve: The aortic valve has been repaired/replaced. Aortic valve regurgitation is trivial. Aortic regurgitation PHT measures 984 msec. No  aortic stenosis is present. Aortic valve mean gradient measures 14.0 mmHg. Aortic valve peak gradient measures 24.9 mmHg. Aortic valve area, by VTI measures 1.92 cm. There is a 23 mm Edwards INSPIRIS RESILIA pericardial valve valve present in the aortic position. Pulmonic Valve: The pulmonic valve was normal in structure. Pulmonic valve regurgitation is not visualized. No evidence of pulmonic stenosis. Aorta: The aortic root is normal in size and structure. Venous: The inferior vena cava is normal in size with greater than 50% respiratory variability, suggesting right atrial pressure of 3 mmHg. IAS/Shunts: No atrial level shunt detected by color flow Doppler.  LEFT VENTRICLE PLAX 2D LVIDd:         4.38 cm     Diastology LVIDs:         3.50 cm     LV e' medial:    4.53 cm/s LV PW:         0.78 cm     LV E/e' medial:  34.0 LV IVS:        0.98 cm     LV e' lateral:   6.20 cm/s LVOT diam:     1.90 cm     LV E/e' lateral: 24.8 LV SV:         81 LV SV Index:   48 LVOT Area:     2.84 cm  LV Volumes (MOD) LV vol d, MOD A2C: 68.6 ml LV vol d, MOD A4C: 89.6 ml LV vol s, MOD A2C: 46.6 ml LV vol s, MOD A4C: 49.2 ml LV SV MOD A2C:     22.0 ml LV SV MOD A4C:     89.6 ml LV SV MOD BP:      34.9 ml RIGHT VENTRICLE RV S prime:     7.87 cm/s TAPSE (M-mode): 0.9 cm LEFT ATRIUM             Index  LA diam:        3.60 cm 2.12 cm/m LA Vol (A2C):   55.1 ml 32.41 ml/m LA Vol (A4C):   75.5 ml 44.40 ml/m LA Biplane Vol:         40.80 ml/m  AORTIC VALVE AV Area (Vmax):    1.60 cm AV Area (Vmean):   1.70 cm AV Area (VTI):     1.92 cm AV Vmax:           249.50 cm/s AV Vmean:          164.250 cm/s AV VTI:            0.422 m AV Peak Grad:      24.9 mmHg AV Mean Grad:      14.0 mmHg LVOT Vmax:         141.00 cm/s LVOT Vmean:        98.200 cm/s LVOT VTI:          0.285 m LVOT/AV VTI ratio: 0.68 AI PHT:            984 msec  AORTA Ao Root diam: 2.90 cm MITRAL VALVE MV Area (PHT):              SHUNTS MV Decel Time: 240 msec     Systemic VTI:  0.29 m MR Peak grad: 7.1 mmHg      Systemic Diam: 1.90 cm MR Vmax:      133.00 cm/s MV E velocity: 154.00 cm/s MV A velocity: 81.40 cm/s MV E/A ratio:  1.89 Ena Dawley MD Electronically signed by Ena Dawley  MD Signature Date/Time: 06/05/2020/9:43:17 AM    Final      CBC Recent Labs  Lab 05/21/2020 1418 06/12/20 0608  WBC 21.7* 21.2*  HGB 10.5* 9.6*  HCT 32.4* 29.1*  PLT 191 175  MCV 82.9 83.6  MCH 26.9 27.6  MCHC 32.4 33.0  RDW 15.0 15.4  LYMPHSABS 0.6*  --   MONOABS 0.6  --   EOSABS 0.0  --   BASOSABS 0.1  --    Chemistries  Recent Labs  Lab 06/01/2020 1418 06/12/20 0608  NA 128* 132*  K 3.8 3.3*  CL 90* 95*  CO2 30 30  GLUCOSE 425* 96  BUN 12 13  CREATININE 0.51* 0.46*  CALCIUM 8.4* 8.4*  MG 1.8  --   AST 18  --   ALT 23  --   ALKPHOS 139*  --   BILITOT 0.9  --     Recent Labs    06/12/20 0608  CHOL 62  HDL 19*  LDLCALC 31  TRIG 61  CHOLHDL 3.3    Lab Results  Component Value Date   HGBA1C 11.7 (H) 06/10/2020   No results for input(s): TSH, T4TOTAL, T3FREE, THYROIDAB in the last 72 hours.  Invalid input(s): FREET3 ------------------------------------------------------------------------------------------------------------------ No results for input(s): VITAMINB12, FOLATE, FERRITIN, TIBC, IRON, RETICCTPCT in the  last 72 hours.  Coagulation profile Recent Labs  Lab 05/24/2020 1418 06/12/20 0608  INR 1.9* 2.0*    No results for input(s): DDIMER in the last 72 hours.  Cardiac Enzymes No results for input(s): CKMB, TROPONINI, MYOGLOBIN in the last 168 hours.  Invalid input(s): CK ------------------------------------------------------------------------------------------------------------------    Component Value Date/Time   BNP 382.0 (H) 06/09/2020 1418   Roxan Hockey M.D on 06/12/2020 at 6:14 PM  Go to [email or web address] - for contact info  [facility] - Office  [phone]

## 2020-06-12 NOTE — ED Notes (Signed)
Dr. Robb Matar in to see pt

## 2020-06-12 NOTE — Progress Notes (Signed)
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  Patient had been on aztreonam, as patient's PCN allergy couldn't be confirmed due to his confusion.  Pharmacy was consulted today for ampicillin + ceftriaxone dosing for endocarditis, but due to PCN allergy , it was decided that patient should remain on vancomycin until while patient's family discusses care goals.   Plan:  Increase vancomycin  dose to 750 mg IV  q12h-->watch renal fx closely, as it's likely over-estimated due to patient's body habitus Goal vancomycin trough range: 15-20 mcg/mL Pharmacy will continue to monitor renal function, vancomycin troughs as clinically appropriate,  cultures and patient progress.  Height: 5\' 7"  (170.2 cm) Weight: 57.8 kg (127 lb 6.8 oz) IBW/kg (Calculated) : 66.1  Temp (24hrs), Avg:100.1 F (37.8 C), Min:98.2 F (36.8 C), Max:102.5 F (39.2 C)  Recent Labs  Lab 05/16/2020 1418 05/28/2020 1556 06/12/20 0608  WBC 21.7*  --  21.2*  CREATININE 0.51*  --  0.46*  LATICACIDVEN 1.6 1.5  --     Estimated Creatinine Clearance: 75.3 mL/min (A) (by C-G formula based on SCr of 0.46 mg/dL (L)).    Allergies  Allergen Reactions  . Tramadol Other (See Comments)    Itchiness, visual hallucination.  06/14/20 Penicillins      Unknown reaction was told it happened as a child    Antimicrobials this admission: vancomycin 10/28 >>   aztreonam 10/28 >>10/29 metronidazole 10/28 x1 dose     Microbiology results:  10/28 [facility] x2:  BCID show E. faecalis, sensitive to vancomycin  10/28 Resp PCR: SARS CoV-2:  negative  Flu A/B: negative  10/28  MRSA PCR:    10/28 UCx:  Thank you for allowing pharmacy to participate in this patient's care.  11/28 06/12/2020 12:29 PM

## 2020-06-12 NOTE — Progress Notes (Signed)
Initial Nutrition Assessment  DOCUMENTATION CODES:   Not applicable  INTERVENTION:  Monitor for diet advancement, will provide ONS as appropriate    NUTRITION DIAGNOSIS:   Increased nutrient needs related to acute illness, chronic illness as evidenced by estimated needs.    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 Pt admitted for acute CVA.  Rapid response called this morning, pt noted slumped over and unresponsive during swallowing evaluation. Initially pt not responsive to sternal rub, then became rousable and able to speak with RN, however unable to recall name or answer questions. Diet was not initiated, ST following and will re-evaluate if indicated.  MD in with patient and family at RD first attempt to see pt. This afternoon, sonographer exiting room and pt sleeping soundly with no family at bedside. RD observed moderate fat/muscle depletions, however unable to perform complete exam at this time. Will plan to obtain history and full exam at follow-up and continue to follow for diet advancement.   Current wt 127.16 lb Per chart, weights have trended down ~18 lbs (12.6%) in the last 7 weeks; significant.  Medications reviewed and include: SSI, Levemir, MVI with minerals, Warfarin, Rocephin, Vancomycin LR @ 75 ml/hr Labs: CBGs 140,97, Na 132, K 3.3 (L), WBC 21.2 (H), RBC 3.48 (L), Hgb 9.6 (L), HCT 29.1 (L)   NUTRITION - FOCUSED  PHYSICAL EXAM: Moderate orbital fat depletion; Moderate temple, dorsal hand; Severe clavicle bone muscle depletion   Diet Order:   Diet Order            Diet NPO time specified  Diet effective now                 EDUCATION NEEDS:   Not appropriate for education at this time  Skin:  Skin Assessment: Reviewed RN Assessment  Last BM:  pta  Height:   Ht Readings from Last 1 Encounters:  06/12/20 5\' 7"  (1.702 m)    Weight:   Wt Readings from Last 1 Encounters:  06/12/20 57.8 kg   BMI:  Body mass index is 19.96 kg/m.  Estimated Nutritional Needs:   Kcal:  06/14/20  Protein:  75-87  Fluid:  >1.5 L   [phone], RD, LDN Clinical Nutrition After Hours/Weekend Pager # in Amion

## 2020-06-12 NOTE — Progress Notes (Signed)
  Echocardiogram 2D Echocardiogram has been performed.  Scott Rios 06/12/2020, 3:17 PM

## 2020-06-13 ENCOUNTER — Inpatient Hospital Stay (HOSPITAL_COMMUNITY): Payer: Medicare Other

## 2020-06-13 DIAGNOSIS — I2510 Atherosclerotic heart disease of native coronary artery without angina pectoris: Secondary | ICD-10-CM

## 2020-06-13 DIAGNOSIS — A4181 Sepsis due to Enterococcus: Secondary | ICD-10-CM

## 2020-06-13 LAB — PROTIME-INR
INR: 3.1 — ABNORMAL HIGH (ref 0.8–1.2)
Prothrombin Time: 30.7 seconds — ABNORMAL HIGH (ref 11.4–15.2)

## 2020-06-13 LAB — GLUCOSE, CAPILLARY
Glucose-Capillary: 123 mg/dL — ABNORMAL HIGH (ref 70–99)
Glucose-Capillary: 129 mg/dL — ABNORMAL HIGH (ref 70–99)
Glucose-Capillary: 218 mg/dL — ABNORMAL HIGH (ref 70–99)
Glucose-Capillary: 282 mg/dL — ABNORMAL HIGH (ref 70–99)
Glucose-Capillary: 248 mg/dL — ABNORMAL HIGH (ref 70–99)
Glucose-Capillary: 189 mg/dL — ABNORMAL HIGH (ref 70–99)

## 2020-06-13 MED ORDER — INSULIN DETEMIR 100 UNIT/ML ~~LOC~~ SOLN
10.0000 [IU] | Freq: Every day | SUBCUTANEOUS | Status: DC
  Administered 2020-06-14 – 2020-06-16 (×3): 10 [IU] via SUBCUTANEOUS
  Filled 2020-06-13 (×5): qty 0.1

## 2020-06-13 NOTE — Progress Notes (Signed)
Patient Demographics:    Geneva Pallas, is a 64 y.o. male, DOB - July 13, 1956, ZCU:[id number]  Admit date - 06/02/2020   Admitting Physician Albertine Patricia, MD  Outpatient Primary MD for the patient is Redmond School, MD  LOS - 2   Chief Complaint  Patient presents with  . Altered Mental Status        Subjective:    Taevyn Hausen is sitting up in bed does not appear to be in distress -He is awake and alert and answering questions appropriately -He knows that he is in the hospital and why he is on the hospital -He tried to eat chicken dumplings earlier today but had difficulty since he did not have his dentures -His sister is at the bedside and feels that he is doing much better today  Assessment  & Plan :    Principal Problem:   Acute CVA (cerebrovascular accident) / Septic Emboli Vs Afib Related CVAs Active Problems:   Tobacco abuse   Mitral regurgitation   S/P CABG x 6   Malnutrition of moderate degree   CAD (coronary artery disease)   S/P mitral valve replacement   Atrial fibrillation (HCC)   Status post aortic valve replacement with bioprosthetic valve   Current use of long term anticoagulation   Enterococcus Fecalis bacteremia   Sepsis due to Enterococcus-Enterococcus faecalis bacteremia and sepsis  Brief Summary:-  64 y.o. male, with a history of DM 2, HTN, ongoing heavy smoking and COPD, previous EtOH abuse. Other history includes multivessel coronary artery disease, severe aortic stenosis with insufficiency, mitral valve insufficiency, status post CABG x6, status post aortic valve replacement, status post mitral valve annuloplasty, acute pulmonary edema, acute systolic heart failure, history of depression, postoperative atrial fibrillation admitted on 06/11/2028 with acute metabolic encephalopathy due to acute strokes and Enterococcus bacteremia/sepsis  A/p 1) acute CVAs -MRI  brain and MRA head--- with Numerous widely scattered acute infarctions in the cerebellum and both cerebral hemispheres consistent with embolic disease from the heart or ascending aorta. 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 fib anticoagulation can be continued -Discussed with on-call infectious disease physician Dr. Gale Journey -Please keep n.p.o. on Sunday night for possibility of TEE on Monday, 06/15/2020 -Continue Lipitor 80 mg daily and Coumadin therapy -Neurology consult requested -PT eval appreciated, commence SNF rehab -Speech eval pending  2) Enterococcus faecalis bacteremia and sepsis --- blood cultures from 05/24/2020 noted, --continue IV Vancomycin and Rocephin TTE with EF of 55 to 60% -Echo on 06/12/20 demonstrates evidence of aortic and mitral valve replacements with no evidence of mitral stenosis or aortic stenosis -Patient needs TEE as above to rule out endocarditis especially in the setting of bioprosthetic valves --Discussed with on-call infectious disease physician Dr. Gale Journey -Please note that patient did met sepsis criteria on admission with fever of 102.5 heart rate of 114 respiratory rate of 28 and altered mentation in the  setting of Enterococcus faecalis bacteremia -Blood cultures repeated on 10/30  3)Social/Ethics--patient is currently awake, alert and coherent -Discussed CODE STATUS with patient in the presence of his sister, Quita Skye -Patient made it very clear that he did not wish to receive CPR/intubation -He understood that in the event of a cardiac/respiratory arrest if he  did not receive these measures that he would die -In honoring his wishes, DNR order was placed  4) acute metabolic encephalopathy--- due to #1 and #2 above.  Appears to have improved  5)HFpEF--history of combined diastolic and systolic dysfunction CHF repeat echo on 06/12/2020 with EF of 55 to 60% which is higher than patient's prior EF -Continue to hold torsemide -Needs IV fluids/hydration for sepsis and n.p.o. status, be judicious with IV fluids given underlying CHF  6)History  of CAD status post recent CABG x6 and 04/09/2020 --- no ACS type symptoms --Continue Lipitor, switch metoprolol to IV as patient is n.p.o.  7) chronic atrial fibrillation--on Coumadin for anticoagulation for presumed valvular A. Fib, pharmacy to help manage Coumadin therapy -Patient has history of eractic INRs  8) status post recent aortic valve and mitral valve replacements---  Status postAortic valve replacement using a 23 mm Edwards INSPIRIS RESILIA pericardial valve,  - Status post mitral valve repair with 28 mm Sorin, Memo 4D annuloplasty ring, clipping of left atrial appendage -Echo on 06/12/20 demonstrates evidence of aortic and mitral valve replacements with no evidence of mitral stenosis or aortic stenosis  9)DM2-A1c is 11.7 reflecting uncontrolled DM with hyperglycemia -Currently on Lantus 5 units daily Use Novolog/Humalog Sliding scale insulin with Accu-Cheks/Fingersticks as ordered -Hold Metformin  -Avoid over aggressive control at this time as patient is n.p.o. due to altered mentation and acute strokes -Since blood sugars are trending up, will increase Lantus to 10 units daily   Disposition/Need for in-Hospital Stay- patient unable to be discharged at this time due to --- acute CVAs and enterococcal sepsis requiring IV antibiotics and further stroke work-up  Status is: Inpatient  Remains inpatient appropriate because:acute CVAs and enterococcal sepsis requiring IV antibiotics and further stroke  work-up   Disposition: The patient is from: Home              Anticipated d/c is to: SNF              Anticipated d/c date is: > 3 days              Patient currently is not medically stable to d/c. Barriers: Not Clinically Stable- acute CVAs and enterococcal sepsis requiring IV antibiotics and further stroke work-up  Code Status : full code  Family Communication:Discussed with sister at bedside Consults  :  ID/Cardiology/Neurology  DVT Prophylaxis  : Coumadin therapy  Lab Results  Component Value Date   PLT 175 06/12/2020    Inpatient Medications  Scheduled Meds: .  stroke: mapping our early stages of recovery book   Does not apply Once  . atorvastatin  80 mg Oral Daily  . insulin aspart  0-15 Units Subcutaneous Q4H  . insulin detemir  5 Units Subcutaneous Daily  . metoprolol tartrate  2.5 mg Intravenous Q8H  . multivitamin with minerals  1 tablet Oral Daily  . tamsulosin  0.4 mg Oral Daily  . Warfarin - Pharmacist Dosing Inpatient   Does not apply q1600   Continuous Infusions: . sodium chloride 40 mL/hr at 06/12/20 2014  . cefTRIAXone (ROCEPHIN)  IV 2 g (06/13/20 1139)  . vancomycin 750 mg (06/13/20 1339)  PRN Meds:.acetaminophen **OR** acetaminophen (TYLENOL) oral liquid 160 mg/5 mL **OR** acetaminophen, dextrose    Anti-infectives (From admission, onward)   Start     Dose/Rate Route Frequency Ordered Stop   06/12/20 1800  vancomycin (VANCOREADY) IVPB 750 mg/150 mL  Status:  Discontinued        750 mg 150 mL/hr over 60 Minutes Intravenous Every 24 hours 05/22/2020 1647 05/29/2020 1742   06/12/20 1800  vancomycin (VANCOREADY) IVPB 750 mg/150 mL  Status:  Discontinued        750 mg 150 mL/hr over 60 Minutes Intravenous Every 24 hours 06/12/20 0820 06/12/20 1202   06/12/20 1300  vancomycin (VANCOREADY) IVPB 750 mg/150 mL        750 mg 150 mL/hr over 60 Minutes Intravenous Every 12 hours 06/12/20 1202     06/12/20 1200  ampicillin (OMNIPEN) 2 g in sodium chloride 0.9  % 100 mL IVPB  Status:  Discontinued        2 g 300 mL/hr over 20 Minutes Intravenous Every 4 hours 06/12/20 1102 06/12/20 1130   06/12/20 1200  cefTRIAXone (ROCEPHIN) 2 g in sodium chloride 0.9 % 100 mL IVPB        2 g 200 mL/hr over 30 Minutes Intravenous Every 12 hours 06/12/20 1102     06/12/20 0100  aztreonam (AZACTAM) 2 g in sodium chloride 0.9 % 100 mL IVPB  Status:  Discontinued        2 g 200 mL/hr over 30 Minutes Intravenous Every 8 hours 05/18/2020 1647 06/04/2020 1742   06/01/2020 1700  vancomycin (VANCOREADY) IVPB 1500 mg/300 mL        1,500 mg 150 mL/hr over 120 Minutes Intravenous  Once 05/25/2020 1645 06/09/2020 2114   05/29/2020 1530  aztreonam (AZACTAM) 2 g in sodium chloride 0.9 % 100 mL IVPB        2 g 200 mL/hr over 30 Minutes Intravenous  Once 06/03/2020 1521 05/26/2020 1750   05/30/2020 1530  metroNIDAZOLE (FLAGYL) IVPB 500 mg        500 mg 100 mL/hr over 60 Minutes Intravenous  Once 05/16/2020 1521 06/10/2020 1719   05/23/2020 1530  vancomycin (VANCOCIN) IVPB 1000 mg/200 mL premix  Status:  Discontinued        1,000 mg 200 mL/hr over 60 Minutes Intravenous  Once 05/30/2020 1521 05/22/2020 1644        Objective:   Vitals:   06/13/20 0551 06/13/20 0800 06/13/20 1200 06/13/20 1600  BP: 135/67 130/76 119/72 121/77  Pulse: 77 76 88 95  Resp:  _0 Temp:  99 F (37.2 C) 98.6 F (37 C) 98.5 F (36.9 C)  TempSrc:  Oral Oral Oral  SpO2:  97% 97% 100%  Weight:      Height:        Wt Readings from Last 3 Encounters:  06/12/20 57.8 kg  05/13/20 60.3 kg  04/24/20 67 kg     Intake/Output Summary (Last 24 hours) at 06/13/2020 1653 Last data filed at 06/13/2020 1500 Gross per 24 hour  Intake 1899.28 ml  Output 1550 ml  Net 349.28 ml    Physical Exam  General exam: Alert, awake, oriented x 3 Respiratory system: Clear to auscultation. Respiratory effort normal. Cardiovascular system:RRR. No murmurs, rubs, gallops. Gastrointestinal system: Abdomen is nondistended, soft and  nontender. No organomegaly or masses felt. Normal bowel sounds heard. Central nervous system: Alert and oriented. No focal neurological deficits. Extremities: No C/C/E, +pedal pulses Skin: No rashes, lesions  or ulcers Psychiatry: Judgement and insight appear normal. Mood & affect appropriate.     Data Review:   Micro Results Recent Results (from the past 240 hour(s))  Respiratory Panel by RT PCR (Flu A&B, Covid) - Nasopharyngeal Swab     Status: None   Collection Time: 05/23/2020  1:36 PM   Specimen: Nasopharyngeal Swab  Result Value Ref Range Status   SARS Coronavirus 2 by RT PCR NEGATIVE NEGATIVE Final    Comment: (NOTE) SARS-CoV-2 target nucleic acids are NOT DETECTED.  The SARS-CoV-2 RNA is generally detectable in upper respiratoy specimens during the acute phase of infection. The lowest concentration of SARS-CoV-2 viral copies this assay can detect is 131 copies/mL. A negative result does not preclude SARS-Cov-2 infection and should not be used as the sole basis for treatment or other patient management decisions. A negative result may occur with  improper specimen collection/handling, submission of specimen other than nasopharyngeal swab, presence of viral mutation(s) within the areas targeted by this assay, and inadequate number of viral copies (<131 copies/mL). A negative result must be combined with clinical observations, patient history, and epidemiological information. The expected result is Negative.  Fact Sheet for Patients:  PinkCheek.be  Fact Sheet for Healthcare Providers:  GravelBags.it  This test is no t yet approved or cleared by the [place] FDA and  has been authorized for detection and/or diagnosis of SARS-CoV-2 by FDA under an Emergency Use Authorization (EUA). This EUA will remain  in effect (meaning this test can be used) for the duration of the COVID-19 declaration under Section 564(b)(1)  of the Act, 21 U.S.C. section 360bbb-3(b)(1), unless the authorization is terminated or revoked sooner.     Influenza A by PCR NEGATIVE NEGATIVE Final   Influenza B by PCR NEGATIVE NEGATIVE Final    Comment: (NOTE) The Xpert Xpress SARS-CoV-2/FLU/RSV assay is intended as an aid in  the diagnosis of influenza from Nasopharyngeal swab specimens and  should not be used as a sole basis for treatment. Nasal washings and  aspirates are unacceptable for Xpert Xpress SARS-CoV-2/FLU/RSV  testing.  Fact Sheet for Patients: PinkCheek.be  Fact Sheet for Healthcare Providers: GravelBags.it  This test is not yet approved or cleared by the [place] FDA and  has been authorized for detection and/or diagnosis of SARS-CoV-2 by  FDA under an Emergency Use Authorization (EUA). This EUA will remain  in effect (meaning this test can be used) for the duration of the  Covid-19 declaration under Section 564(b)(1) of the Act, 21  U.S.C. section 360bbb-3(b)(1), unless the authorization is  terminated or revoked. Performed at [facility], [address] [place], [place] [address]   Culture, blood (routine x 2)     Status: Abnormal (Preliminary result)   Collection Time: 05/30/2020  2:22 PM   Specimen: BLOOD LEFT FOREARM  Result Value Ref Range Status   Specimen Description   Final    BLOOD LEFT FOREARM Performed at [facility], [place] [address], [place], [place] [address]    Special Requests   Final    NONE Performed at [facility], [address], [place], [place] [address]    Culture  Setup Time   Final    IN BOTH AEROBIC AND ANAEROBIC BOTTLES GRAM POSITIVE COCCI Gram Stain Report Called to,Read Back By and Verified With: C TURNER,RN_0  06/12/20 MKELLY    Culture (A)  Final    ENTEROCOCCUS FAECALIS SUSCEPTIBILITIES TO FOLLOW Performed at [facility], [place] [address],  [place], [place] [address]    Report Status PENDING   Incomplete  Blood Culture ID Panel (Reflexed)     Status: Abnormal   Collection Time: 06/04/2020  2:22 PM  Result Value Ref Range Status   Enterococcus faecalis DETECTED (A) NOT DETECTED Final    Comment: CRITICAL RESULT CALLED TO, READ BACK BY AND VERIFIED WITH: G. Coffee PharmD 8:20 06/12/20 ([email or web address])    Enterococcus Faecium NOT DETECTED NOT DETECTED Final   Listeria monocytogenes NOT DETECTED NOT DETECTED Final   Staphylococcus species NOT DETECTED NOT DETECTED Final   Staphylococcus aureus (BCID) NOT DETECTED NOT DETECTED Final   Staphylococcus epidermidis NOT DETECTED NOT DETECTED Final   Staphylococcus lugdunensis NOT DETECTED NOT DETECTED Final   Streptococcus species NOT DETECTED NOT DETECTED Final   Streptococcus agalactiae NOT DETECTED NOT DETECTED Final   Streptococcus pneumoniae NOT DETECTED NOT DETECTED Final   Streptococcus pyogenes NOT DETECTED NOT DETECTED Final   A.calcoaceticus-baumannii NOT DETECTED NOT DETECTED Final   Bacteroides fragilis NOT DETECTED NOT DETECTED Final   Enterobacterales NOT DETECTED NOT DETECTED Final   Enterobacter cloacae complex NOT DETECTED NOT DETECTED Final   Escherichia coli NOT DETECTED NOT DETECTED Final   Klebsiella aerogenes NOT DETECTED NOT DETECTED Final   Klebsiella oxytoca NOT DETECTED NOT DETECTED Final   Klebsiella pneumoniae NOT DETECTED NOT DETECTED Final   Proteus species NOT DETECTED NOT DETECTED Final   Salmonella species NOT DETECTED NOT DETECTED Final   Serratia marcescens NOT DETECTED NOT DETECTED Final   Haemophilus influenzae NOT DETECTED NOT DETECTED Final   Neisseria meningitidis NOT DETECTED NOT DETECTED Final   Pseudomonas aeruginosa NOT DETECTED NOT DETECTED Final   Stenotrophomonas maltophilia NOT DETECTED NOT DETECTED Final   Candida albicans NOT DETECTED NOT DETECTED Final   Candida auris NOT DETECTED NOT DETECTED Final   Candida glabrata NOT DETECTED NOT DETECTED Final   Candida krusei NOT DETECTED NOT  DETECTED Final   Candida parapsilosis NOT DETECTED NOT DETECTED Final   Candida tropicalis NOT DETECTED NOT DETECTED Final   Cryptococcus neoformans/gattii NOT DETECTED NOT DETECTED Final   Vancomycin resistance NOT DETECTED NOT DETECTED Final    Comment: Performed at [facility], [address], [place], [place] [address]  Culture, blood (routine x 2)     Status: Abnormal (Preliminary result)   Collection Time: 05/21/2020  2:26 PM   Specimen: BLOOD LEFT HAND  Result Value Ref Range Status   Specimen Description   Final    BLOOD LEFT HAND Performed at [facility], [place] [address] [place], [place] [address]    Special Requests   Final    NONE Performed at [facility], [address], [place], [place] [address]    Culture  Setup Time   Final    IN BOTH AEROBIC AND ANAEROBIC BOTTLES GRAM POSITIVE COCCI Gram Stain Report Called to,Read Back By and Verified With: C TURNER,RN_0  06/12/20 [facility] Performed at [facility], [address] [place], [place] [address]    Culture ENTEROCOCCUS FAECALIS (A)  Final   Report Status PENDING  Incomplete  Urine culture     Status: Abnormal (Preliminary result)   Collection Time: 06/07/2020  7:26 PM   Specimen: Urine, Clean Catch  Result Value Ref Range Status   Specimen Description   Final    URINE, CLEAN CATCH Performed at [facility], [address] [place], [place] [address]    Special Requests   Final    NONE Performed at [facility]  [address] [place], [place] [address]    Culture (A)  Final    >=100,000 COLONIES/mL ENTEROCOCCUS FAECALIS SUSCEPTIBILITIES TO FOLLOW Performed at [place] [address], [place], [place] [address]    Report Status PENDING  Incomplete  Culture, blood (routine x 2)     Status: None (Preliminary result)   Collection Time: 06/13/20  2:52 PM   Specimen: BLOOD  Result Value Ref Range Status   Specimen Description BLOOD LEFT ANTECUBITAL  Final   Special Requests   Final     BOTTLES DRAWN AEROBIC AND ANAEROBIC Blood Culture adequate volume Performed at [facility], [place] [address] [place], [place] [address]    Culture PENDING  Incomplete   Report Status PENDING  Incomplete  Culture, blood (routine x 2)     Status: None (Preliminary result)   Collection Time: 06/13/20  2:52 PM   Specimen: BLOOD  Result Value Ref Range Status   Specimen Description BLOOD BLOOD RIGHT HAND  Final   Special Requests   Final    BOTTLES DRAWN AEROBIC AND ANAEROBIC Blood Culture adequate volume Performed at [facility], [place] [address], [place], [place] [address]    Culture PENDING  Incomplete   Report Status PENDING  Incomplete    Radiology Reports CT Head Wo Contrast  Result Date: 06/12/2020 CLINICAL DATA:  Altered mental status. EXAM: CT HEAD WITHOUT CONTRAST TECHNIQUE: Contiguous axial images were obtained from the base of the skull through the vertex without intravenous contrast. COMPARISON:  April 03, 2020. FINDINGS: Brain: New low density is seen in right periventricular white matter concerning for infarction of indeterminate age. No hemorrhage or mass lesion is noted. No midline shift is noted. Ventricular size is within normal limits. Vascular: No hyperdense vessel or unexpected calcification. Skull: Normal. Negative for fracture or focal lesion. Sinuses/Orbits: No acute finding. Other: None. IMPRESSION: New low density is seen in right periventricular white matter concerning for infarction of indeterminate age. MRI is recommended for further evaluation. Electronically Signed   By: Marijo Conception M.D.   On: 06/10/2020 15:30   MR ANGIO HEAD WO CONTRAST  Result Date: 06/06/2020 CLINICAL DATA:  Follow-up stroke.  Altered mental status. EXAM: MRI HEAD WITHOUT CONTRAST MRA HEAD WITHOUT CONTRAST TECHNIQUE: Multiplanar, multiecho pulse sequences of the brain and surrounding structures were obtained without intravenous contrast.  Angiographic images of the head were obtained using MRA technique without contrast. COMPARISON:  Head CT same day.  Head CT 04/03/2020.  MRI 03/28/2020. FINDINGS: MRI HEAD FINDINGS Brain: Diffusion imaging shows numerous widely scattered acute infarctions consistent with embolic disease from the heart or ascending aorta. Scattered punctate infarctions are present within the cerebellum. Right cerebral hemisphere shows small infarctions in the frontal and parietal white matter, with a larger, 1.5 cm acute infarction in the right posterior frontal cortical and subcortical brain. On the left, there are more numerous punctate infarctions in the frontal and parietal lobes, with several areas of cortical involvement in the parietal region. No evidence of swelling, hemorrhage or mass effect. Elsewhere, there chronic small-vessel ischemic changes of the pons and cerebral hemispheric white matter. No hydrocephalus or extra-axial fluid collection. Vascular: Major vessels at the base of the brain show flow. Skull and upper cervical spine: Negative Sinuses/Orbits: Clear/normal Other: None MRA HEAD FINDINGS Both internal carotid arteries are patent through the skull base and siphon regions. The anterior and middle cerebral vessels are patent without discernible large or medium vessel  occlusion. No correctable proximal stenosis. 30% narrowing of the right M1 segment. Both vertebral arteries are patent to the basilar. No basilar stenosis. Posterior circulation branch vessels are patent. IMPRESSION: 1. Numerous widely scattered acute infarctions in the cerebellum and both cerebral hemispheres consistent with embolic disease from the heart or ascending aorta. Largest, 1.5 cm acute infarction is in the right posterior frontal cortical and subcortical brain. No evidence of swelling, hemorrhage or mass effect. 2. MR angiography does not show any large or medium vessel occlusion or correctable proximal stenosis. 30% narrowing of the  right M1 segment. Electronically Signed   By: Nelson Chimes M.D.   On: 05/15/2020 18:59   MR BRAIN WO CONTRAST  Result Date: 06/10/2020 CLINICAL DATA:  Follow-up stroke.  Altered mental status. EXAM: MRI HEAD WITHOUT CONTRAST MRA HEAD WITHOUT CONTRAST TECHNIQUE: Multiplanar, multiecho pulse sequences of the brain and surrounding structures were obtained without intravenous contrast. Angiographic images of the head were obtained using MRA technique without contrast. COMPARISON:  Head CT same day.  Head CT 04/03/2020.  MRI 03/28/2020. FINDINGS: MRI HEAD FINDINGS Brain: Diffusion imaging shows numerous widely scattered acute infarctions consistent with embolic disease from the heart or ascending aorta. Scattered punctate infarctions are present within the cerebellum. Right cerebral hemisphere shows small infarctions in the frontal and parietal white matter, with a larger, 1.5 cm acute infarction in the right posterior frontal cortical and subcortical brain. On the left, there are more numerous punctate infarctions in the frontal and parietal lobes, with several areas of cortical involvement in the parietal region. No evidence of swelling, hemorrhage or mass effect. Elsewhere, there chronic small-vessel ischemic changes of the pons and cerebral hemispheric white matter. No hydrocephalus or extra-axial fluid collection. Vascular: Major vessels at the base of the brain show flow. Skull and upper cervical spine: Negative Sinuses/Orbits: Clear/normal Other: None MRA HEAD FINDINGS Both internal carotid arteries are patent through the skull base and siphon regions. The anterior and middle cerebral vessels are patent without discernible large or medium vessel occlusion. No correctable proximal stenosis. 30% narrowing of the right M1 segment. Both vertebral arteries are patent to the basilar. No basilar stenosis. Posterior circulation branch vessels are patent. IMPRESSION: 1. Numerous widely scattered acute infarctions in  the cerebellum and both cerebral hemispheres consistent with embolic disease from the heart or ascending aorta. Largest, 1.5 cm acute infarction is in the right posterior frontal cortical and subcortical brain. No evidence of swelling, hemorrhage or mass effect. 2. MR angiography does not show any large or medium vessel occlusion or correctable proximal stenosis. 30% narrowing of the right M1 segment. Electronically Signed   By: Nelson Chimes M.D.   On: 06/08/2020 18:59   US Carotid Bilateral (at [facility] and [facility] only)  Result Date: 06/12/2020 CLINICAL DATA:  CVA. EXAM: BILATERAL CAROTID DUPLEX ULTRASOUND TECHNIQUE: Pearline Cables scale imaging, color Doppler and duplex ultrasound were performed of bilateral carotid and vertebral arteries in the neck. COMPARISON:  MRI brain 06/03/2020 FINDINGS: Criteria: Quantification of carotid stenosis is based on velocity parameters that correlate the residual internal carotid diameter with NASCET-based stenosis levels, using the diameter of the distal internal carotid lumen as the denominator for stenosis measurement. The following velocity measurements were obtained: RIGHT ICA: 93/23 cm/sec CCA: 103/15 cm/sec SYSTOLIC ICA/CCA RATIO:  0.9 ECA: 156 cm/sec LEFT ICA: 97/34 cm/sec CCA: 945/85 cm/sec SYSTOLIC ICA/CCA RATIO:  0.8 ECA: 120 cm/sec RIGHT CAROTID ARTERY: Heterogeneous plaque in the right common carotid artery. Small amount of echogenic plaque at the  carotid bulb. External carotid artery is patent with normal waveform. Small amount of plaque in the proximal internal carotid artery. Normal waveforms and velocities in the internal carotid artery. RIGHT VERTEBRAL ARTERY: Antegrade flow and normal waveform in the right vertebral artery. LEFT CAROTID ARTERY: Heterogeneous and echogenic plaque in the left common carotid artery. Echogenic plaque at the left carotid bulb. External carotid artery is patent with normal waveform. Plaque in the left internal carotid artery. Normal waveforms and  velocities in the internal carotid artery. LEFT VERTEBRAL ARTERY: Antegrade flow and normal waveform in the left vertebral artery. IMPRESSION: 1. Atherosclerotic disease involving bilateral carotid arteries. Estimated degree of stenosis in the internal carotid arteries is less than 50% bilaterally. 2. Patent vertebral arteries with antegrade flow. Electronically Signed   By: Richarda Overlie M.D.   On: 06/12/2020 11:58   DG Chest Port 1 View  Result Date: 05/17/2020 CLINICAL DATA:  Decreased eating and drinking. History of heart surgery 2 months ago. EXAM: PORTABLE CHEST 1 VIEW COMPARISON:  Chest radiograph dated 05/13/2020 FINDINGS: The heart size and mediastinal contours are within normal limits. Both lungs are clear. The visualized skeletal structures are unremarkable. Median sternotomy wires, cardiac valve prosthesis, and atrial appendage clip are redemonstrated. IMPRESSION: No active disease. Electronically Signed   By: Romona Curls M.D.   On: 05/28/2020 15:19   ECHOCARDIOGRAM COMPLETE  Result Date: 06/12/2020    ECHOCARDIOGRAM REPORT   Patient Name:   AVEREY TROMPETER Date of Exam: 06/12/2020 Medical Rec #:  [id number]      Height:       67.0 in Accession #:    [id number]     Weight:       127.4 lb Date of Birth:  1956/07/19      BSA:          1.670 m Patient Age:    64 years       BP:           108/61 mmHg Patient Gender: M              HR:           87 bpm. [facility] Procedure: 2D Echo, Color Doppler and Cardiac Doppler Indications:    Stroke 434.91 / I163.9  History:        Patient has prior history of Echocardiogram examinations, most                 recent 06/05/2020. Risk Factors:Hypertension and Diabetes.                 Aortic Valve: 23 mm valve is present in the aortic position.                 Procedure Date: 04/09/2020.  Sonographer:    Eulah Pont RDCS Referring Phys: [id number] DAWOOD S [place] IMPRESSIONS  1. Left ventricular ejection fraction, by estimation, is 55 to 60%. The  left ventricle has normal function. Left ventricular endocardial border not optimally defined to evaluate regional wall motion. Left ventricular diastolic parameters are indeterminate.  2. Right ventricular systolic function is normal. The right ventricular size is normal.  3. Left atrial size was severely dilated.  4. 28 mm Sorin, Memo 4D annuloplasty ring at the MV anulus. Mild gradient across the MV anulus, mean gradient 5 mmHg. . The mitral valve has been repaired/replaced. No evidence of mitral valve regurgitation. No evidence of mitral stenosis. Severe mitral  annular calcification.  5. 23  mm Edwards INSPIRIS RESILIA pericardial valve at the AV position. . The aortic valve has been repaired/replaced. There is severe calcifcation of the aortic valve. There is severe thickening of the aortic valve. Aortic valve regurgitation is not visualized. No aortic stenosis is present. There is a 23 mm valve present in the aortic position. Procedure Date: 04/09/2020.  6. The inferior vena cava is normal in size with greater than 50% respiratory variability, suggesting right atrial pressure of 3 mmHg. FINDINGS  Left Ventricle: Left ventricular ejection fraction, by estimation, is 55 to 60%. The left ventricle has normal function. Left ventricular endocardial border not optimally defined to evaluate regional wall motion. The left ventricular internal cavity size was normal in size. There is no left ventricular hypertrophy. Left ventricular diastolic parameters are indeterminate. Right Ventricle: The right ventricular size is normal. No increase in right ventricular wall thickness. Right ventricular systolic function is normal. Left Atrium: Left atrial size was severely dilated. Right Atrium: Right atrial size was normal in size. Pericardium: There is no evidence of pericardial effusion. Mitral Valve: 28 mm Sorin, Memo 4D annuloplasty ring at the MV anulus. Mild gradient across the MV anulus, mean gradient 5 mmHg. The mitral  valve has been repaired/replaced. There is mild thickening of the mitral valve leaflet(s). There is mild calcification of the mitral valve leaflet(s). Severe mitral annular calcification. No evidence of mitral valve regurgitation. No evidence of mitral valve stenosis. The mean mitral valve gradient is 5.2 mmHg with average heart rate of 96 bpm. Tricuspid Valve: The tricuspid valve is normal in structure. Tricuspid valve regurgitation is not demonstrated. No evidence of tricuspid stenosis. Aortic Valve: 23 mm Edwards INSPIRIS RESILIA pericardial valve at the AV position. The aortic valve has been repaired/replaced. There is severe calcifcation of the aortic valve. There is severe thickening of the aortic valve. There is severe aortic valve  annular calcification. Aortic valve regurgitation is not visualized. No aortic stenosis is present. Aortic valve mean gradient measures 11.4 mmHg. Aortic valve peak gradient measures 19.4 mmHg. Aortic valve area, by VTI measures 2.23 cm. There is a 23 mm valve present in the aortic position. Procedure Date: 04/09/2020. Pulmonic Valve: The pulmonic valve was not well visualized. Pulmonic valve regurgitation is not visualized. No evidence of pulmonic stenosis. Aorta: The aortic root is normal in size and structure. Pulmonary Artery: Indeterminant PASP, inadequate TR jet. Venous: The inferior vena cava is normal in size with greater than 50% respiratory variability, suggesting right atrial pressure of 3 mmHg. IAS/Shunts: No atrial level shunt detected by color flow Doppler.  LEFT VENTRICLE PLAX 2D LVIDd:         4.30 cm  Diastology LVIDs:         2.99 cm  LV e' medial:    3.00 cm/s LV PW:         0.95 cm  LV E/e' medial:  50.3 LV IVS:        0.79 cm  LV e' lateral:   8.11 cm/s LVOT diam:     2.30 cm  LV E/e' lateral: 18.6 LV SV:         77 LV SV Index:   46 LVOT Area:     4.15 cm  RIGHT VENTRICLE RV S prime:     10.00 cm/s TAPSE (M-mode): 1.7 cm LEFT ATRIUM           Index        RIGHT ATRIUM  Index LA diam:      3.90 cm 2.34 cm/m  RA Area:     15.00 cm LA Vol (A2C): 26.0 ml 15.57 ml/m RA Volume:   35.80 ml  21.44 ml/m LA Vol (A4C): 76.9 ml 46.06 ml/m  AORTIC VALVE AV Area (Vmax):    2.25 cm AV Area (Vmean):   2.17 cm AV Area (VTI):     2.23 cm AV Vmax:           220.18 cm/s AV Vmean:          154.037 cm/s AV VTI:            0.343 m AV Peak Grad:      19.4 mmHg AV Mean Grad:      11.4 mmHg LVOT Vmax:         119.27 cm/s LVOT Vmean:        80.595 cm/s LVOT VTI:          0.185 m LVOT/AV VTI ratio: 0.54  AORTA Ao Root diam: 2.90 cm Ao Asc diam:  3.90 cm MITRAL VALVE MV Area (PHT): 2.26 cm     SHUNTS MV Mean grad:  5.2 mmHg     Systemic VTI:  0.18 m MV Decel Time: 335 msec     Systemic Diam: 2.30 cm MV E velocity: 151.00 cm/s MV A velocity: 56.80 cm/s MV E/A ratio:  2.66 Carlyle Dolly MD Electronically signed by Carlyle Dolly MD Signature Date/Time: 06/12/2020/4:40:14 PM    Final    ECHOCARDIOGRAM COMPLETE  Result Date: 06/05/2020    ECHOCARDIOGRAM REPORT   Patient Name:   CHRISTY FRIEDE Date of Exam: 06/03/2020 Medical Rec #:  [id number]      Height:       67.0 in Accession #:    [id number]     Weight:       133.0 lb Date of Birth:  [birth year]      BSA:          1.700 m Patient Age:    78 years       BP:           119/72 mmHg Patient Gender: M              HR:           105 bpm. Exam Location:  [facility] Procedure: 2D Echo, Cardiac Doppler and Color Doppler Indications:    I35.9 AVD, I05.9 MVD  History:        Patient has prior history of Echocardiogram examinations, most                 recent 03/31/2020. CHF, CAD, Prior CABG, Aortic Valve Disease and                 Mitral Valve Disease; Risk Factors:Former Smoker, Hypertension,                 Diabetes and Dyslipidemia.                  Mitral Valve: 28 mm Sorin, Memo 4D annuloplasty ring valve is                 present in the mitral position. Procedure Date: 04/09/2020.  Sonographer:    Jeneen Montgomery Referring Phys:  [place]  1. Since the last study on 03/31/2020 the patient is post AVR and MVR, CABG. There is only trivial residual mitral and aortic regurgitation. LVEF has improved  from 35-40% to 55-60%.  2. Left ventricular ejection fraction, by estimation, is 55 to 60%. The left ventricle has normal function. The left ventricle has no regional wall motion abnormalities. Left ventricular diastolic function could not be evaluated. Elevated left atrial pressure.  3. Right ventricular systolic function is normal. The right ventricular size is normal.  4. Left atrial size was mildly dilated.  5. The mitral valve has been repaired/replaced. Trivial mitral valve regurgitation. No evidence of mitral stenosis. There is a 28 mm Sorin, Memo 4D annuloplasty ring present in the mitral position. Procedure Date: 04/09/2020.  6. The aortic valve has been repaired/replaced. Aortic valve regurgitation is trivial. No aortic stenosis is present. Procedure Date: 04/09/2020. Aortic valve mean gradient measures 14.0 mmHg.  7. The inferior vena cava is normal in size with greater than 50% respiratory variability, suggesting right atrial pressure of 3 mmHg. Comparison(s): Echocardiogram done 03/31/20 showed an EF of 35-40% with severe AR and moderate to severe MR. FINDINGS  Left Ventricle: Left ventricular ejection fraction, by estimation, is 55 to 60%. The left ventricle has normal function. The left ventricle has no regional wall motion abnormalities. The left ventricular internal cavity size was normal in size. There is  no left ventricular hypertrophy. Abnormal (paradoxical) septal motion consistent with post-operative status. Left ventricular diastolic function could not be evaluated due to atrial fibrillation. Left ventricular diastolic function could not be evaluated. Elevated left atrial pressure. Right Ventricle: The right ventricular size is normal. No increase in right ventricular wall thickness. Right ventricular  systolic function is normal. Left Atrium: Left atrial size was mildly dilated. Right Atrium: Right atrial size was normal in size. Pericardium: There is no evidence of pericardial effusion. Mitral Valve: The mitral valve has been repaired/replaced. Trivial mitral valve regurgitation. There is a 28 mm Sorin, Memo 4D annuloplasty ring present in the mitral position. Procedure Date: 04/09/2020. No evidence of mitral valve stenosis. Tricuspid Valve: The tricuspid valve is normal in structure. Tricuspid valve regurgitation is mild . No evidence of tricuspid stenosis. Aortic Valve: The aortic valve has been repaired/replaced. Aortic valve regurgitation is trivial. Aortic regurgitation PHT measures 984 msec. No aortic stenosis is present. Aortic valve mean gradient measures 14.0 mmHg. Aortic valve peak gradient measures 24.9 mmHg. Aortic valve area, by VTI measures 1.92 cm. There is a 23 mm Edwards INSPIRIS RESILIA pericardial valve valve present in the aortic position. Pulmonic Valve: The pulmonic valve was normal in structure. Pulmonic valve regurgitation is not visualized. No evidence of pulmonic stenosis. Aorta: The aortic root is normal in size and structure. Venous: The inferior vena cava is normal in size with greater than 50% respiratory variability, suggesting right atrial pressure of 3 mmHg. IAS/Shunts: No atrial level shunt detected by color flow Doppler.  LEFT VENTRICLE PLAX 2D LVIDd:         4.38 cm     Diastology LVIDs:         3.50 cm     LV e' medial:    4.53 cm/s LV PW:         0.78 cm     LV E/e' medial:  34.0 LV IVS:        0.98 cm     LV e' lateral:   6.20 cm/s LVOT diam:     1.90 cm     LV E/e' lateral: 24.8 LV SV:         81 LV SV Index:   48 LVOT Area:     2.84  cm  LV Volumes (MOD) LV vol d, MOD A2C: 68.6 ml LV vol d, MOD A4C: 89.6 ml LV vol s, MOD A2C: 46.6 ml LV vol s, MOD A4C: 49.2 ml LV SV MOD A2C:     22.0 ml LV SV MOD A4C:     89.6 ml LV SV MOD BP:      34.9 ml RIGHT VENTRICLE RV S prime:      7.87 cm/s TAPSE (M-mode): 0.9 cm LEFT ATRIUM             Index LA diam:        3.60 cm 2.12 cm/m LA Vol (A2C):   55.1 ml 32.41 ml/m LA Vol (A4C):   75.5 ml 44.40 ml/m LA Biplane Vol:         40.80 ml/m  AORTIC VALVE AV Area (Vmax):    1.60 cm AV Area (Vmean):   1.70 cm AV Area (VTI):     1.92 cm AV Vmax:           249.50 cm/s AV Vmean:          164.250 cm/s AV VTI:            0.422 m AV Peak Grad:      24.9 mmHg AV Mean Grad:      14.0 mmHg LVOT Vmax:         141.00 cm/s LVOT Vmean:        98.200 cm/s LVOT VTI:          0.285 m LVOT/AV VTI ratio: 0.68 AI PHT:            984 msec  AORTA Ao Root diam: 2.90 cm MITRAL VALVE MV Area (PHT):              SHUNTS MV Decel Time: 240 msec     Systemic VTI:  0.29 m MR Peak grad: 7.1 mmHg      Systemic Diam: 1.90 cm MR Vmax:      133.00 cm/s MV E velocity: 154.00 cm/s MV A velocity: 81.40 cm/s MV E/A ratio:  1.89 Ena Dawley MD Electronically signed by Ena Dawley MD Signature Date/Time: 06/05/2020/9:43:17 AM    Final      CBC Recent Labs  Lab 05/30/2020 1418 06/12/20 0608  WBC 21.7* 21.2*  HGB 10.5* 9.6*  HCT 32.4* 29.1*  PLT 191 175  MCV 82.9 83.6  MCH 26.9 27.6  MCHC 32.4 33.0  RDW 15.0 15.4  LYMPHSABS 0.6*  --   MONOABS 0.6  --   EOSABS 0.0  --   BASOSABS 0.1  --    Chemistries  Recent Labs  Lab 05/15/2020 1418 06/12/20 0608  NA 128* 132*  K 3.8 3.3*  CL 90* 95*  CO2 30 30  GLUCOSE 425* 96  BUN 12 13  CREATININE 0.51* 0.46*  CALCIUM 8.4* 8.4*  MG 1.8  --   AST 18  --   ALT 23  --   ALKPHOS 139*  --   BILITOT 0.9  --     Recent Labs    06/12/20 0608  CHOL 62  HDL 19*  LDLCALC 31  TRIG 61  CHOLHDL 3.3    Lab Results  Component Value Date   HGBA1C 11.7 (H) 05/17/2020   No results for input(s): TSH, T4TOTAL, T3FREE, THYROIDAB in the last 72 hours.  Invalid input(s): FREET3 ------------------------------------------------------------------------------------------------------------------ No results for input(s):  VITAMINB12, FOLATE, FERRITIN, TIBC, IRON, RETICCTPCT in the last 72 hours.  Coagulation  profile Recent Labs  Lab 05/16/2020 1418 06/12/20 0608 06/13/20 0556  INR 1.9* 2.0* 3.1*    No results for input(s): DDIMER in the last 72 hours.  Cardiac Enzymes No results for input(s): CKMB, TROPONINI, MYOGLOBIN in the last 168 hours.  Invalid input(s): CK ------------------------------------------------------------------------------------------------------------------    Component Value Date/Time   BNP 382.0 (H) 05/27/2020 1418   Kathie Dike M.D on 06/13/2020 at 4:53 PM  Go to [email or web address] - for contact info  [facility] - Office  [phone]

## 2020-06-13 NOTE — Progress Notes (Signed)
ANTICOAGULATION CONSULT NOTE -   Pharmacy Consult for warfarin dosing Indication: atrial fibrillation  Allergies  Allergen Reactions  . Tramadol Other (See Comments)    Itchiness, visual hallucination.  Marland Kitchen Penicillins      Unknown reaction was told it happened as a child    Patient Measurements: Height: 5\' 7"  (170.2 cm) Weight: 57.8 kg (127 lb 6.8 oz) IBW/kg (Calculated) : 66.1 Heparin Dosing Weight: HEPARIN DW (KG): 57.8  Vital Signs: Temp: 98.9 F (37.2 C) (10/30 0418) Temp Source: Oral (10/30 0418) BP: 135/67 (10/30 0551) Pulse Rate: 77 (10/30 0551)  Labs: Recent Labs    05/25/2020 1418 06/04/2020 1556 06/12/20 0608 06/13/20 0556  HGB 10.5*  --  9.6*  --   HCT 32.4*  --  29.1*  --   PLT 191  --  175  --   LABPROT 20.8*  --  22.1* 30.7*  INR 1.9*  --  2.0* 3.1*  CREATININE 0.51*  --  0.46*  --   TROPONINIHS 42* 38*  --   --     Estimated Creatinine Clearance: 75.3 mL/min (A) (by C-G formula based on SCr of 0.46 mg/dL (L)).   Medical History: Past Medical History:  Diagnosis Date  . Diabetes mellitus without complication (HCC)   . Hypertension      Assessment: Pharmacy consulted to dose warfarin for this 64 yo male on chronic anti-coagulation therapy with warfarin for atrial fibrillation.     Home dose: unable to confirm last dosing regimen --pt is too confused and has had multiple strokes Last regimen of 12mg /week resulted in INR of 5.2   Drug Interactions: pt had one dose of IV metronidazole on 10-28  INR: 2.0>3.1 --> supratherapeutic 10/28 dose:  Not given 10/29: 1 mg  CBC: Hb 9.6  RN reports no signs/symptoms of bleeding  Goal of Therapy:  INR 2-3 Monitor platelets by anticoagulation protocol: Yes   Plan:  Hold warfarin x 1 dose Daily INR and every other day CBC Monitor for signs and symptoms of bleeding.  11/28, PharmD Clinical Pharmacist 06/13/2020 8:50 AM

## 2020-06-14 DIAGNOSIS — I639 Cerebral infarction, unspecified: Secondary | ICD-10-CM

## 2020-06-14 LAB — CBC
HCT: 26.2 % — ABNORMAL LOW (ref 39.0–52.0)
Hemoglobin: 8.5 g/dL — ABNORMAL LOW (ref 13.0–17.0)
MCH: 26.8 pg (ref 26.0–34.0)
MCHC: 32.4 g/dL (ref 30.0–36.0)
MCV: 82.6 fL (ref 80.0–100.0)
Platelets: 238 10*3/uL (ref 150–400)
RBC: 3.17 MIL/uL — ABNORMAL LOW (ref 4.22–5.81)
RDW: 15.6 % — ABNORMAL HIGH (ref 11.5–15.5)
WBC: 20.8 10*3/uL — ABNORMAL HIGH (ref 4.0–10.5)
nRBC: 0 % (ref 0.0–0.2)

## 2020-06-14 LAB — GLUCOSE, CAPILLARY
Glucose-Capillary: 153 mg/dL — ABNORMAL HIGH (ref 70–99)
Glucose-Capillary: 99 mg/dL (ref 70–99)
Glucose-Capillary: 103 mg/dL — ABNORMAL HIGH (ref 70–99)
Glucose-Capillary: 186 mg/dL — ABNORMAL HIGH (ref 70–99)
Glucose-Capillary: 203 mg/dL — ABNORMAL HIGH (ref 70–99)
Glucose-Capillary: 269 mg/dL — ABNORMAL HIGH (ref 70–99)
Glucose-Capillary: 177 mg/dL — ABNORMAL HIGH (ref 70–99)

## 2020-06-14 LAB — CULTURE, BLOOD (ROUTINE X 2)

## 2020-06-14 LAB — BASIC METABOLIC PANEL
Anion gap: 7 (ref 5–15)
BUN: 12 mg/dL (ref 8–23)
CO2: 27 mmol/L (ref 22–32)
Calcium: 7.9 mg/dL — ABNORMAL LOW (ref 8.9–10.3)
Chloride: 94 mmol/L — ABNORMAL LOW (ref 98–111)
Creatinine, Ser: 0.36 mg/dL — ABNORMAL LOW (ref 0.61–1.24)
GFR, Estimated: >60 mL/min (ref >60)
Glucose, Bld: 104 mg/dL — ABNORMAL HIGH (ref 70–99)
Potassium: 3.1 mmol/L — ABNORMAL LOW (ref 3.5–5.1)
Sodium: 128 mmol/L — ABNORMAL LOW (ref 135–145)

## 2020-06-14 LAB — BLOOD GAS, ARTERIAL
Acid-Base Excess: 5.4 mmol/L — ABNORMAL HIGH (ref 0.0–2.0)
Bicarbonate: 29.4 mmol/L — ABNORMAL HIGH (ref 20.0–28.0)
FIO2: 21
O2 Saturation: 94.8 %
Patient temperature: 38.1
pCO2 arterial: 38.8 mmHg (ref 32.0–48.0)
pH, Arterial: 7.487 — ABNORMAL HIGH (ref 7.350–7.450)
pO2, Arterial: 78.8 mmHg — ABNORMAL LOW (ref 83.0–108.0)

## 2020-06-14 LAB — URINE CULTURE: Culture: >=100000 — AB

## 2020-06-14 LAB — PROTIME-INR
INR: 1.4 — ABNORMAL HIGH (ref 0.8–1.2)
Prothrombin Time: 16.9 seconds — ABNORMAL HIGH (ref 11.4–15.2)

## 2020-06-14 LAB — HEPARIN LEVEL (UNFRACTIONATED): Heparin Unfractionated: <0.1 IU/mL — ABNORMAL LOW (ref 0.30–0.70)

## 2020-06-14 MED ORDER — THIAMINE HCL 100 MG/ML IJ SOLN
100.0000 mg | Freq: Every day | INTRAMUSCULAR | Status: DC
  Administered 2020-06-14: 100 mg via INTRAVENOUS
  Filled 2020-06-14 (×2): qty 2

## 2020-06-14 MED ORDER — HEPARIN (PORCINE) 25000 UT/250ML-% IV SOLN
1550.0000 [IU]/h | INTRAVENOUS | Status: DC
  Administered 2020-06-14: 900 [IU]/h via INTRAVENOUS
  Administered 2020-06-15: 1250 [IU]/h via INTRAVENOUS
  Administered 2020-06-16: 1550 [IU]/h via INTRAVENOUS
  Filled 2020-06-14 (×3): qty 250

## 2020-06-14 MED ORDER — THIAMINE HCL 100 MG PO TABS
100.0000 mg | ORAL_TABLET | Freq: Every day | ORAL | Status: DC
  Administered 2020-06-15 – 2020-06-16 (×2): 100 mg via ORAL
  Filled 2020-06-14 (×2): qty 1

## 2020-06-14 MED ORDER — WARFARIN SODIUM 1 MG PO TABS: 1.0000 mg | ORAL_TABLET | Freq: Once | ORAL | Status: DC

## 2020-06-14 MED ORDER — LEVETIRACETAM IN NACL 500 MG/100ML IV SOLN
500.0000 mg | Freq: Two times a day (BID) | INTRAVENOUS | Status: DC
  Administered 2020-06-14 – 2020-06-16 (×4): 500 mg via INTRAVENOUS
  Filled 2020-06-14 (×4): qty 100

## 2020-06-14 MED ORDER — LEVETIRACETAM IN NACL 1500 MG/100ML IV SOLN
1500.0000 mg | Freq: Once | INTRAVENOUS | Status: AC
  Administered 2020-06-14: 1500 mg via INTRAVENOUS
  Filled 2020-06-14: qty 100

## 2020-06-14 NOTE — Progress Notes (Addendum)
ANTICOAGULATION CONSULT NOTE -   Pharmacy Consult for warfarin dosing Indication: atrial fibrillation/valve replacements  Allergies  Allergen Reactions  . Tramadol Other (See Comments)    Itchiness, visual hallucination.  Marland Kitchen Penicillins      Unknown reaction was told it happened as a child    Patient Measurements: Height: 5\' 7"  (170.2 cm) Weight: 57.8 kg (127 lb 6.8 oz) IBW/kg (Calculated) : 66.1 Heparin Dosing Weight: HEPARIN DW (KG): 57.8  Vital Signs: Temp: 98 F (36.7 C) (10/31 0543) Temp Source: Oral (10/31 0543) BP: 155/88 (10/31 0543) Pulse Rate: 95 (10/31 0543)  Labs: Recent Labs    06/13/2020 1418 05/17/2020 1418 05/29/2020 1556 06/12/20 0608 06/13/20 0556 06/14/20 0556  HGB 10.5*   < >  --  9.6*  --  8.5*  HCT 32.4*  --   --  29.1*  --  26.2*  PLT 191  --   --  175  --  238  LABPROT 20.8*   < >  --  22.1* 30.7* 16.9*  INR 1.9*   < >  --  2.0* 3.1* 1.4*  CREATININE 0.51*  --   --  0.46*  --  0.36*  TROPONINIHS 42*  --  38*  --   --   --    < > = values in this interval not displayed.    Estimated Creatinine Clearance: 75.3 mL/min (A) (by C-G formula based on SCr of 0.36 mg/dL (L)).   Medical History: Past Medical History:  Diagnosis Date  . Diabetes mellitus without complication (HCC)   . Hypertension      Assessment: Pharmacy consulted to dose warfarin for this 64 yo male on chronic anti-coagulation therapy with warfarin for atrial fibrillation.    Switching to heparin for anticoagulation per neurology recommendation.    INR: 2.0>3.1>1.4 10/28 dose:  Not given 10/29: 1 mg 10/30: no dose   CBC: Hb 8.5 RN reports no signs/symptoms of bleeding  Goal of Therapy:  HL 0.3-0.7 Monitor platelets by anticoagulation protocol: Yes   Plan:  No bolus Start heparin infusion at 900 units/hr. Heparin in 6 hours and daily. Monitor for signs and symptoms of bleeding.  11/30, PharmD Clinical Pharmacist 06/14/2020 8:26 AM

## 2020-06-14 NOTE — Progress Notes (Signed)
Staff called to room by family, patient unresponsive. MD informed and arrived in room to assess patient. Patient slowly returned to previous status and became alert.

## 2020-06-14 NOTE — Progress Notes (Signed)
Responded to a rapid response.  Patient was unresponsive to sternal rub.  Pt responded and reached for my hand when performing a pinch to his chest.  Soon after patient was awake and responsive.  Vitals were stable throughout.

## 2020-06-14 NOTE — Progress Notes (Signed)
ANTICOAGULATION CONSULT NOTE -   Pharmacy Consult for heparin dosing Indication: atrial fibrillation/valve replacements    Assessment: Pharmacy consulted to dose heparin for this 64 yo male on chronic anti-coagulation therapy with warfarin for atrial fibrillation.   Initial heparin level <0.1 units/ml Goal of Therapy:  HL 0.3-0.7 Monitor platelets by anticoagulation protocol: Yes   Plan:  Increase heparin infusion to 1100 units/hr. Heparin in 6 hours and daily. Monitor for signs and symptoms of bleeding.  Thanks for allowing pharmacy to be a part of this patient's care.  Talbert Cage, PharmD Clinical Pharmacist  06/14/2020 10:56 PM

## 2020-06-14 NOTE — Consult Note (Signed)
[facility] TELEMEDICINE  CONSULT   Date of service: June 14, 2020 Patient Name: Scott Rios MRN:  [id number] DOB:  Dec 28, 1955 Requesting Provider: Erick Blinks, MD Location of the provider: [facility] Location of the patient: [facility] Reason for consult: "embolic strokes"   This consult was provided via telemedicine with 2-way video and audio communication. The patient/family was informed that care would be provided in this way and agreed to receive care in this manner.    History of Present Illness  Scott Rios is a 64 y.o. male with PMH significant for DM2, HTN, smoker, EtOH use, CAD, severe aortic stenosis and insufficiency s/p CABG and aortic valve replacement and mitral valve annuloplasty, acute systolic heart failure, depression, post op recurrent afibb and was placed on warfarin for atleast 3 months who is admitted to [facility] with acute encephalopathy. Found to have enterococcus bacteremia/sepsis and embolic appearing strokes.  Current active issues include following: 1. Numerous small acute BL emoblic appearing strokes with largest about 1.5cm in the R posterior frontal cortex. Workup with TTE negative for any vegetations, TEE is pending. MR Angio head without contrast with no LVO, no aneurysms, 30% narrowing of the R M1 segment.  2. Enterococcus faecalis bacteremia and sepsis, presented with Fever with Tmax of 102.2, currently on Vancomycin and Rocephin.  Patient was noted to have atleast 3 episodes of brief decreased responsiveness with return to baseline. Neurology was asked to assess him on teleneurology.  [name] refused to cooperate with the exam and refsued to provide any meaningful history.  I spoke to the staff in the room and they had witnessed a few of these episodes. During the epsiode, his eyes and head goes back, mouth open and tongue comes out and he is unresponsive. He also appears to be starring off  in the space. He seems to keep going in and out of the episodes episodes and the entire thing lasts from 5-10 mins to 30 mins.   Stroke Measures   Last Known Well: unknown TPA Given: not a candidate IR Thrombectomy: not a candidate mRS: Modified Rankin Scale: 1-No significant post stroke disability and can perform usual duties with stroke symptoms Time of teleneurologist evaluation: 12:00 on 06/14/20.   Vitals   Vitals:   06/14/20 0156 06/14/20 0543 06/14/20 0800 06/14/20 1053  BP: (!) 144/82 (!) 155/88 134/72 116/62  Pulse: 96 95 92   Resp: 16 18 18 18   Temp: 98.7 F (37.1 C) 98 F (36.7 C) 98.9 F (37.2 C) (!) 100.5 F (38.1 C)  TempSrc: Oral Oral Oral Oral  SpO2: 97% 98% 95% 97%  Weight:      Height:         Body mass index is 19.96 kg/m.   Tele-neuro Exam and NIHSS   General: mild slurred speech, slowed thought process, refuses to cooperate with exam. to look to left and to right. Able to move BL upper extremities spontaneously, I did note some movement in hie LLE but non in his RLE and RLE was externally rotated. Unclear if he was just being uncooperative or he could not move his RLE.  1A: Level of Consciousness - 1 1B: Ask Month and Age - 2 1C: 'Blink Eyes' & 'Squeeze Hands' - 2 2: Test Horizontal Extraocular Movements - 0 3: Test Visual Fields - 0 4: Test Facial Palsy - 0 5A: Test Left Arm Motor Drift - 0 5B: Test Right Arm Motor Drift - 0 6A:  Test Left Leg Motor Drift - 3 6B: Test Right Leg Motor Drift - 4 7: Test Limb Ataxia - 0 8: Test Sensation - 0 9: Test Language/Aphasia- 0 10: Test Dysarthria - 1 11: Test Extinction/Inattention - 0 NIHSS score: 13  Imaging and Labs   CBC:  Recent Labs  Lab 2020/06/29 1418 06-29-2020 1418 06/12/20 0608 06/14/20 0556  WBC 21.7*   < > 21.2* 20.8*  NEUTROABS 20.2*  --   --   --   HGB 10.5*   < > 9.6* 8.5*  HCT 32.4*   < > 29.1* 26.2*  MCV 82.9   < > 83.6 82.6  PLT 191   < > 175 238   < > = values in  this interval not displayed.    Basic Metabolic Panel:  Lab Results  Component Value Date   NA 128 (L) 06/14/2020   K 3.1 (L) 06/14/2020   CO2 27 06/14/2020   GLUCOSE 104 (H) 06/14/2020   BUN 12 06/14/2020   CREATININE 0.36 (L) 06/14/2020   CALCIUM 7.9 (L) 06/14/2020   GFRNONAA >60 06/14/2020   GFRAA >60 04/30/2020   Lipid Panel:  Lab Results  Component Value Date   LDLCALC 31 06/12/2020   HgbA1c:  Lab Results  Component Value Date   HGBA1C 11.7 (H) 06-29-20   Urine Drug Screen:     Component Value Date/Time   LABOPIA NONE DETECTED 2020-06-29 1927   COCAINSCRNUR NONE DETECTED 2020-06-29 1927   LABBENZ NONE DETECTED Jun 29, 2020 1927   AMPHETMU NONE DETECTED 2020-06-29 1927   THCU NONE DETECTED 2020-06-29 1927   LABBARB NONE DETECTED 2020/06/29 1927    Alcohol Level     Component Value Date/Time   ETH <11 09/29/2012 1610   MRI Brain without contrast: 1. Numerous widely scattered acute infarctions in the cerebellum and both cerebral hemispheres consistent with embolic disease from the heart or ascending aorta. Largest, 1.5 cm acute infarction is in the right posterior frontal cortical and subcortical brain. No evidence of swelling, hemorrhage or mass effect.  MR Angio head without contrast: MR angiography does not show any large or medium vessel occlusion or correctable proximal stenosis. 30% narrowing of the right M1 Segment.  US carotid BL: 1. Atherosclerotic disease involving bilateral carotid arteries. Estimated degree of stenosis in the internal carotid arteries is less than 50% bilaterally. 2. Patent vertebral arteries with antegrade flow.  Echocardiogram: 1. Left ventricular ejection fraction, by estimation, is 55 to 60%. The  left ventricle has normal function. Left ventricular endocardial border  not optimally defined to evaluate regional wall motion. Left ventricular  diastolic parameters are  indeterminate.  2. Right ventricular systolic  function is normal. The right ventricular  size is normal.  3. Left atrial size was severely dilated.  4. 28 mm Sorin, Memo 4D annuloplasty ring at the MV anulus. Mild gradient  across the MV anulus, mean gradient 5 mmHg. . The mitral valve has been  repaired/replaced. No evidence of mitral valve regurgitation. No evidence  of mitral stenosis. Severe mitral  annular calcification.  5. 23 mm Edwards INSPIRIS RESILIA pericardial valve at the AV position. .  The aortic valve has been repaired/replaced. There is severe calcifcation  of the aortic valve. There is severe thickening of the aortic valve.  Aortic valve regurgitation is not  visualized. No aortic stenosis is present. There is a 23 mm valve present  in the aortic position. Procedure Date: 04/09/2020.  6. The inferior vena cava is normal in  size with greater than 50%  respiratory variability, suggesting right atrial pressure of 3 mmHg.    Impression   Scott Rios is a 64 y.o. male with PMH significant for DM2, HTN, smoker, EtOH use, CAD, severe aortic stenosis and insufficiency s/p CABG and aortic valve replacement and mitral valve annuloplasty, acute systolic heart failure, depression, post op recurrent afibb and was placed on warfarin for atleast 3 months who is admitted to [facility] with acute encephalopathy. Found to have enterococcus bacteremia/sepsis and embolic appearing strokes.  History and exam severely limited due to non cooperation despite my best attempt to convince him to help me out. I have some suspicion that he may not be able to move his RLE given external rotation of the R foot and him refusing to do anything with that leg.  He needs TEE which is scheduled for tomorrow in AM. So far based on the MR Angio head, no evidence of septic emboli but this can be insensitive. At this time, given the numerous strokes, reasonable to do Anticoagulation with Heparin gtt since it can be stopped relatively quickly  in case of an ICH.  As for these episodes, he had a CTH after the episode of unresponsiveness and was negative for an ICH, these may be seizures either due to his EtOH use or from his numerous strokes. I loaded him up with Keppra but he will need EEG for further workup. We will als ostart him on Thiamine.  I spoke to the hospitalist Dr. Kerry Hough, given the complexity of this case, he would prefer transfer to [facility] for [place] and for ID to evaluate him in person and to have more resources on standby if he has ICH on heparin gtt which is a reasonable probability given his strokes and concern for endocarditis.   With his strokes and episodes of unresponsiveness, I doubt patient is able to make decisions by himself. Spoke to his son and other family memebers and they agree with transfer.  Recommendations  - I ordered Keppra load of 20mg /Kg - I ordered thiamine 100mg  IV or PO daily after thiamine levels are drawn. - Based on the information I have at this time including a negative TTE for vegetations and negative MRA for any mycotic aneurysm, would recommend anticoagulation with heparin gtt for Anticoagulation. I would not recommend a bolus dose of Heparin.  Stroke workup as below: - Frequent Neuro checks per stroke unit protocol - Recommend obtaining TEE - Anticoagulation as above - SBP goal - gradual normotension. - Recommend Telemetry monitoring for arrythmia - Recommend bedside swallow screen prior to PO intake. - Stroke education booklet - Recommend PT/OT/SLP consult  ______________________________________________________________________    This patient is receiving care for possible acute neurological changes. There was 70 minutes of care by this provider at the time of service, including time for direct evaluation via telemedicine, review of medical records, imaging studies and discussion of findings with providers, the patient and/or family.  [facility] Pager Number  If 7pm- 7am, please page neurology on call as listed in AMION.

## 2020-06-14 NOTE — Progress Notes (Addendum)
PROGRESS NOTE    Scott Rios  GQB:[id number] DOB: Aug 28, 1955 DOA: 05/26/2020 PCP: Redmond School, MD    Brief Narrative:  64 year old male with a history of hypertension, diabetes, recent aortic and mitral valve replacement with bioprosthetic valves, postoperative atrial fibrillation, admitted to the hospital with acute encephalopathy.  He was found to have enterococcal bacteremia and bilateral cerebral infarcts concerning for septic emboli.  He has been on intravenous antibiotics.  Patient has had recurrent episodes of transient change in mental status.  Etiology is unclear and could be new infarcts versus seizures.  Due to high complexity and care need for multidisciplinary approach, he is being transferred to [facility] 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 Active Problems:   Tobacco abuse   Mitral regurgitation   S/P CABG x 6   Malnutrition of moderate degree   CAD (coronary artery disease)   S/P mitral valve replacement   Atrial fibrillation (HCC)   Status post aortic valve replacement with bioprosthetic valve   Current use of long term anticoagulation   Enterococcus Fecalis bacteremia   Sepsis due to Enterococcus-Enterococcus faecalis bacteremia and sepsis   Enterococcus faecalis bacteremia/sepsis -Patient met sepsis criteria on admission with fever of 102.5, heart rate of 114, respiratory of 28 -Currently on intravenous antibiotics with vancomycin and ceftriaxone -Discussed with infectious disease, Dr. Drucilla Schmidt who recommended discontinuing ceftriaxone for now and continuing vancomycin. -Possible transfer to [facility] when he is evaluated by ID, they will consider a penicillin challenge since ampicillin will be optimal therapy -Cultures repeated on 10/30 and appear to continue to grow Enterococcus -2D echocardiogram did not show any evidence of vegetations -Patient has  had mitral and aortic valve replacement in 03/2020 -Initial plans were for TEE to possibly be performed on 11/1.  This will need to be rescheduled at [facility] -Infectious disease will formally consult on patient transfer  Bilateral cerebral/cerebellar infarcts -Concern for underlying septic emboli versus intracardiac thrombus in the setting of A. Fib -Currently he is on intravenous antibiotics as well as anticoagulation -He was seen by neurology by tele neuro consultation -Seen by physical therapy with recommendations for skilled nursing facility placement  Acute encephalopathy -Patient has been having recurrent episodes of lethargy where he becomes unresponsive for approximately 5 minutes followed by a period of somnolence/confusion for 15 to 20 minutes -No jerking motions have been noted -Mental status is otherwise normal in between episodes -Blood sugars have not been low 1 vitals have been otherwise stable -ABG also unrevealing -Seen by Dr. Lorrin Goodell with neurology and received a loading dose of Keppra -Would likely need further monitoring with EEG -CT head done on 10/30 after one of these episodes did not show any new findings  Enterococcal UTI -Urine culture positive for Enterococcus faecalis -Continue on IV antibiotics  Chronic diastolic congestive heart failure -EF of 55 to 60% -Diuretics currently on hold -Volume status appears to be compensated  Chronic atrial fibrillation -Chronically on Coumadin which is currently on hold -He is anticoagulated with heparin  Status post aortic valve and mitral valve replacements -Patient had surgery with Dr. Cyndia Bent -Pending results of TEE may possibly need TCTS input  Diabetes, type II, uncontrolled with hyperglycemia -A1c of 11.7 -Currently on Lantus and sliding scale insulin -Metformin currently on hold -Continue blood sugars  Goals of care -Discussed CODE STATUS with patient in the presence of his sister, Quita Skye -Patient  made it very clear that he did not wish to receive CPR/intubation -He understood that in the event of a cardiac/respiratory arrest if he did not receive these measures that he would die -In honoring his wishes, DNR order was placed -Discussed with patient's son who also acknowledged patient's wishes  DVT prophylaxis: Heparin infusion  Code Status: DNR Family Communication: Updated family at the bedside Disposition Plan: Status is: Inpatient, due to complexity of care and need for multidisciplinary approach, patient is being transferred to [facility] for neurology evaluation, EEG, infectious disease input, TEE and possible TCTS input if needed  Remains inpatient appropriate because:Ongoing diagnostic testing needed not appropriate for outpatient work up   [place]: The patient is from: Home              Anticipated d/c is to: SNF              Anticipated d/c date is: 3 days              Patient currently is medically stable to d/c.         Consultants:   Neurology  Procedures:   2D echocardiogram:1. Left ventricular ejection fraction, by estimation, is 55 to 60%. The  left ventricle has normal function. Left ventricular endocardial border  not optimally defined to evaluate regional wall motion. Left ventricular  diastolic parameters are  indeterminate.  2. Right ventricular systolic function is normal. The right ventricular  size is normal.  3. Left atrial size was severely dilated.  4. 28 mm Sorin, Memo 4D annuloplasty ring at the MV anulus. Mild gradient  across the MV anulus, mean gradient 5 mmHg. . The mitral valve has been  repaired/replaced. No evidence of mitral valve regurgitation. No evidence  of mitral stenosis. Severe mitral  annular calcification.  5. 23 mm Edwards INSPIRIS RESILIA pericardial valve at the AV position. .  The aortic valve has been repaired/replaced. There is severe calcifcation  of the aortic valve. There is severe thickening of  the aortic valve.  Aortic valve regurgitation is not  visualized. No aortic stenosis is present. There is a 23 mm valve present  in the aortic position. Procedure Date: 04/09/2020.  6. The inferior vena cava is normal in size with greater than 50%  respiratory variability, suggesting right atrial pressure of 3 mmHg.   Antimicrobials:   Vancomycin 10/28 >  Ceftriaxone 10/29 > 10/31   Subjective: Patient noted to have another episode of being unresponsive today.  Upon my arrival, patient is somnolent, opens eyes to sternal rub.  He does not answer questions and appears confused.  Is not following commands  Objective: Vitals:   06/14/20 0800 06/14/20 1053 06/14/20 1300 06/14/20 1550  BP: 134/72 116/62 (!) 111/59 128/78  Pulse: 92     Resp: _0 Temp: 98.9 F (37.2 C) (!) 100.5 F (38.1 C) 99 F (37.2 C) 98.4 F (36.9 C)  TempSrc: Oral Oral Oral Oral  SpO2: 95% 97% 93% 97%  Weight:      Height:        Intake/Output Summary (Last 24 hours) at 06/14/2020 1657 Last data filed at 06/14/2020 1500 Gross per 24 hour  Intake 923.28 ml  Output --  Net 923.28 ml   Filed Weights   06/05/2020 1305 06/12/20 0914  Weight: 59 kg 57.8 kg    Examination:  General exam: Appears calm and comfortable  Respiratory system: Clear to auscultation. Respiratory effort normal. Cardiovascular system: S1 &  S2 heard, irregular. No JVD, murmurs, rubs, gallops or clicks. No pedal edema. Gastrointestinal system: Abdomen is nondistended, soft and nontender. No organomegaly or masses felt. Normal bowel sounds heard. Central nervous system: somnolent, slow to respond, decreased left grip strength(not new), difficult exam due to limited patient participation Extremities: no edema bilaterally Skin: No rashes, lesions or ulcers Psychiatry: somnolent     Data Reviewed: I have personally reviewed following labs and imaging studies  CBC: Recent Labs  Lab 06/06/2020 1418 06/12/20 0608  06/14/20 0556  WBC 21.7* 21.2* 20.8*  NEUTROABS 20.2*  --   --   HGB 10.5* 9.6* 8.5*  HCT 32.4* 29.1* 26.2*  MCV 82.9 83.6 82.6  PLT 191 175 875   Basic Metabolic Panel: Recent Labs  Lab 06/09/2020 1418 06/12/20 0608 06/14/20 0556  NA 128* 132* 128*  K 3.8 3.3* 3.1*  CL 90* 95* 94*  CO2 _0 GLUCOSE 425* 96 104*  BUN _1 CREATININE 0.51* 0.46* 0.36*  CALCIUM 8.4* 8.4* 7.9*  MG 1.8  --   --    GFR: Estimated Creatinine Clearance: 75.3 mL/min (A) (by C-G formula based on SCr of 0.36 mg/dL (L)). Liver Function Tests: Recent Labs  Lab 06/10/2020 1418  AST 18  ALT 23  ALKPHOS 139*  BILITOT 0.9  PROT 6.0*  ALBUMIN 2.4*   No results for input(s): LIPASE, AMYLASE in the last 168 hours. No results for input(s): AMMONIA in the last 168 hours. Coagulation Profile: Recent Labs  Lab 06/06/2020 1418 06/12/20 0608 06/13/20 0556 06/14/20 0556  INR 1.9* 2.0* 3.1* 1.4*   Cardiac Enzymes: No results for input(s): CKTOTAL, CKMB, CKMBINDEX, TROPONINI in the last 168 hours. BNP (last 3 results) No results for input(s): PROBNP in the last 8760 hours. HbA1C: No results for input(s): HGBA1C in the last 72 hours. CBG: Recent Labs  Lab 06/14/20 0541 06/14/20 0742 06/14/20 1053 06/14/20 1120 06/14/20 1628  GLUCAP 99 103* 186* 203* 269*   Lipid Profile: Recent Labs    06/12/20 0608  CHOL 62  HDL 19*  LDLCALC 31  TRIG 61  CHOLHDL 3.3   Thyroid Function Tests: No results for input(s): TSH, T4TOTAL, FREET4, T3FREE, THYROIDAB in the last 72 hours. Anemia Panel: No results for input(s): VITAMINB12, FOLATE, FERRITIN, TIBC, IRON, RETICCTPCT in the last 72 hours. Sepsis Labs: Recent Labs  Lab 05/29/2020 1418 05/27/2020 1556  LATICACIDVEN 1.6 1.5    Recent Results (from the past 240 hour(s))  Respiratory Panel by RT PCR (Flu A&B, Covid) - Nasopharyngeal Swab     Status: None   Collection Time: 05/18/2020  1:36 PM   Specimen: Nasopharyngeal Swab  Result Value Ref  Range Status   SARS Coronavirus 2 by RT PCR NEGATIVE NEGATIVE Final    Comment: (NOTE) SARS-CoV-2 target nucleic acids are NOT DETECTED.  The SARS-CoV-2 RNA is generally detectable in upper respiratoy specimens during the acute phase of infection. The lowest concentration of SARS-CoV-2 viral copies this assay can detect is 131 copies/mL. A negative result does not preclude SARS-Cov-2 infection and should not be used as the sole basis for treatment or other patient management decisions. A negative result may occur with  improper specimen collection/handling, submission of specimen other than nasopharyngeal swab, presence of viral mutation(s) within the areas targeted by this assay, and inadequate number of viral copies (<131 copies/mL). A negative result must be combined with clinical observations, patient history, and epidemiological information. The expected result is Negative.  Fact Sheet  for Patients:  PinkCheek.be  Fact Sheet for Healthcare Providers:  GravelBags.it  This test is no t yet approved or cleared by the [place] FDA and  has been authorized for detection and/or diagnosis of SARS-CoV-2 by FDA under an Emergency Use Authorization (EUA). This EUA will remain  in effect (meaning this test can be used) for the duration of the COVID-19 declaration under Section 564(b)(1) of the Act, 21 U.S.C. section 360bbb-3(b)(1), unless the authorization is terminated or revoked sooner.     Influenza A by PCR NEGATIVE NEGATIVE Final   Influenza B by PCR NEGATIVE NEGATIVE Final    Comment: (NOTE) The Xpert Xpress SARS-CoV-2/FLU/RSV assay is intended as an aid in  the diagnosis of influenza from Nasopharyngeal swab specimens and  should not be used as a sole basis for treatment. Nasal washings and  aspirates are unacceptable for Xpert Xpress SARS-CoV-2/FLU/RSV  testing.  Fact Sheet for  Patients: PinkCheek.be  Fact Sheet for Healthcare Providers: GravelBags.it  This test is not yet approved or cleared by the [place] FDA and  has been authorized for detection and/or diagnosis of SARS-CoV-2 by  FDA under an Emergency Use Authorization (EUA). This EUA will remain  in effect (meaning this test can be used) for the duration of the  Covid-19 declaration under Section 564(b)(1) of the Act, 21  U.S.C. section 360bbb-3(b)(1), unless the authorization is  terminated or revoked. Performed at [facility], [address], [place], [place] [address]   Culture, blood (routine x 2)     Status: Abnormal   Collection Time: 05/29/2020  2:22 PM   Specimen: BLOOD LEFT FOREARM  Result Value Ref Range Status   Specimen Description   Final    BLOOD LEFT FOREARM Performed at [facility], [employer] [address], [place], [place] [address]    Special Requests   Final    NONE Performed at [facility], [address] [place], [place] [address]    Culture  Setup Time   Final    IN BOTH AEROBIC AND ANAEROBIC BOTTLES GRAM POSITIVE COCCI Gram Stain Report Called to,Read Back By and Verified With: C TURNER,RN_[email or web address]  06/12/20 MKELLY    Culture ENTEROCOCCUS FAECALIS (A)  Final   Report Status 06/14/2020 FINAL  Final   Organism ID, Bacteria ENTEROCOCCUS FAECALIS  Final      Susceptibility   Enterococcus faecalis - MIC*    AMPICILLIN <=2 SENSITIVE Sensitive     VANCOMYCIN 1 SENSITIVE Sensitive     GENTAMICIN SYNERGY SENSITIVE Sensitive     * ENTEROCOCCUS FAECALIS  Blood Culture ID Panel (Reflexed)     Status: Abnormal   Collection Time: 06/04/2020  2:22 PM  Result Value Ref Range Status   Enterococcus faecalis DETECTED (A) NOT DETECTED Final    Comment: CRITICAL RESULT CALLED TO, READ BACK BY AND VERIFIED WITH: G. Coffee PharmD 8:20 06/12/20 ([email or web address])    Enterococcus Faecium NOT DETECTED NOT DETECTED Final   Listeria monocytogenes  NOT DETECTED NOT DETECTED Final   Staphylococcus species NOT DETECTED NOT DETECTED Final   Staphylococcus aureus (BCID) NOT DETECTED NOT DETECTED Final   Staphylococcus epidermidis NOT DETECTED NOT DETECTED Final   Staphylococcus lugdunensis NOT DETECTED NOT DETECTED Final   Streptococcus species NOT DETECTED NOT DETECTED Final   Streptococcus agalactiae NOT DETECTED NOT DETECTED Final   Streptococcus pneumoniae NOT DETECTED NOT DETECTED Final   Streptococcus pyogenes NOT DETECTED NOT DETECTED Final   A.calcoaceticus-baumannii NOT DETECTED NOT DETECTED Final   Bacteroides fragilis NOT  DETECTED NOT DETECTED Final   Enterobacterales NOT DETECTED NOT DETECTED Final   Enterobacter cloacae complex NOT DETECTED NOT DETECTED Final   Escherichia coli NOT DETECTED NOT DETECTED Final   Klebsiella aerogenes NOT DETECTED NOT DETECTED Final   Klebsiella oxytoca NOT DETECTED NOT DETECTED Final   Klebsiella pneumoniae NOT DETECTED NOT DETECTED Final   Proteus species NOT DETECTED NOT DETECTED Final   Salmonella species NOT DETECTED NOT DETECTED Final   Serratia marcescens NOT DETECTED NOT DETECTED Final   Haemophilus influenzae NOT DETECTED NOT DETECTED Final   Neisseria meningitidis NOT DETECTED NOT DETECTED Final   Pseudomonas aeruginosa NOT DETECTED NOT DETECTED Final   Stenotrophomonas maltophilia NOT DETECTED NOT DETECTED Final   Candida albicans NOT DETECTED NOT DETECTED Final   Candida auris NOT DETECTED NOT DETECTED Final   Candida glabrata NOT DETECTED NOT DETECTED Final   Candida krusei NOT DETECTED NOT DETECTED Final   Candida parapsilosis NOT DETECTED NOT DETECTED Final   Candida tropicalis NOT DETECTED NOT DETECTED Final   Cryptococcus neoformans/gattii NOT DETECTED NOT DETECTED Final   Vancomycin resistance NOT DETECTED NOT DETECTED Final    Comment: Performed at [facility], [place] [address] [place], [place] [address]  Culture, blood (routine x 2)     Status: Abnormal    Collection Time: 05/21/2020  2:26 PM   Specimen: BLOOD LEFT HAND  Result Value Ref Range Status   Specimen Description   Final    BLOOD LEFT HAND Performed at [facility], [place] [address] [place], [place] [address]    Special Requests   Final    NONE Performed at [facility], [place], [address] [place], [place] [address]    Culture  Setup Time   Final    IN BOTH AEROBIC AND ANAEROBIC BOTTLES GRAM POSITIVE COCCI Gram Stain Report Called to,Read Back By and Verified With: C TURNER,[email or web address]  06/12/20 [facility] Performed at [facility], [address] [place], [place] [address]    Culture (A)  Final    ENTEROCOCCUS FAECALIS SUSCEPTIBILITIES PERFORMED ON PREVIOUS CULTURE WITHIN THE LAST 5 DAYS. Performed at [facility], [place] [address] [place], [place] [address]    Report Status 06/14/2020 FINAL  Final  Urine culture     Status: Abnormal   Collection Time: 06/12/2020  7:26 PM   Specimen: Urine, Clean Catch  Result Value Ref Range Status   Specimen Description   Final    URINE, CLEAN CATCH Performed at [facility], [address], [place], [place] [address]    Special Requests   Final    NONE Performed at [facility], [address], [place], [place] [address]    Culture >=100,000 COLONIES/mL ENTEROCOCCUS FAECALIS (A)  Final   Report Status 06/14/2020 FINAL  Final   Organism ID, Bacteria ENTEROCOCCUS FAECALIS (A)  Final      Susceptibility   Enterococcus faecalis - MIC*    AMPICILLIN <=2 SENSITIVE Sensitive     NITROFURANTOIN <=16 SENSITIVE Sensitive     VANCOMYCIN 1 SENSITIVE Sensitive     * >=100,000 COLONIES/mL ENTEROCOCCUS FAECALIS  Culture, blood (routine x 2)     Status: None (Preliminary result)   Collection Time: 06/13/20  2:52 PM   Specimen: BLOOD  Result Value Ref Range Status   Specimen Description   Final    BLOOD LEFT ANTECUBITAL Performed at [facility], [place] [address], [place], [place] [address]    Special Requests   Final  BOTTLES DRAWN AEROBIC AND ANAEROBIC Blood Culture adequate volume Performed at [facility], [address], [place], [place] [address]    Culture   Final    NO GROWTH 1 DAY Performed at [facility], [address], [place], [place] [address]    Report Status PENDING  Incomplete  Culture, blood (routine x 2)     Status: None (Preliminary result)   Collection Time: 06/13/20  2:52 PM   Specimen: BLOOD  Result Value Ref Range Status   Specimen Description   Final    BLOOD BLOOD RIGHT HAND Performed at [facility], [place] [address], [place], [place] [address]    Special Requests   Final    BOTTLES DRAWN AEROBIC AND ANAEROBIC Blood Culture adequate volume Performed at [facility], [place] [address], [place], [place] [address]    Culture  Setup Time   Final    GRAM POSITIVE RODS ANAEROBIC BOTTLE ONLY Gram Stain Report Called to,Read Back By and Verified With: Druscilla Brownie _0  06/14/2020 KAY    Culture   Final    NO GROWTH 1 DAY Performed at [facility], [address], [place],  [address]    Report Status PENDING  Incomplete         Radiology Studies: CT HEAD WO CONTRAST  Result Date: 06/13/2020 CLINICAL DATA:  Encephalopathy EXAM: CT HEAD WITHOUT CONTRAST TECHNIQUE: Contiguous axial images were obtained from the base of the skull through the vertex without intravenous contrast. COMPARISON:  05/22/2020 FINDINGS: Brain: Multifocal hypoattenuation corresponding to recent infarcts identified on MRI. No hemorrhage. No midline shift or other mass effect. Vascular: Atherosclerotic calcification of the internal carotid arteries at the skull base. No abnormal hyperdensity of the major intracranial arteries or dural venous sinuses. Skull: The visualized skull base, calvarium and extracranial soft tissues are normal. Sinuses/Orbits: No fluid levels or advanced mucosal thickening of the visualized paranasal  sinuses. No mastoid or middle ear effusion. The orbits are normal. IMPRESSION: Multifocal hypoattenuation corresponding to recent infarcts identified on MRI. No hemorrhage or mass effect. Electronically Signed   By: Ulyses Jarred M.D.   On: 06/13/2020 23:31        Scheduled Meds: . atorvastatin  80 mg Oral Daily  . insulin aspart  0-15 Units Subcutaneous Q4H  . insulin detemir  10 Units Subcutaneous Daily  . metoprolol tartrate  2.5 mg Intravenous Q8H  . multivitamin with minerals  1 tablet Oral Daily  . tamsulosin  0.4 mg Oral Daily  . thiamine injection  100 mg Intravenous Daily   Or  . thiamine  100 mg Oral Daily  . Warfarin - Pharmacist Dosing Inpatient   Does not apply q1600   Continuous Infusions: . sodium chloride 40 mL/hr at 06/13/20 1722  . heparin 900 Units/hr (06/14/20 1438)  . levETIRAcetam    . vancomycin 750 mg (06/14/20 1301)     LOS: 3 days    Time spent: 57mns    JKathie Dike MD [facility]   If 7PM-7AM, please contact night-coverage [email or web address]  06/14/2020, 4:57 PM

## 2020-06-15 ENCOUNTER — Encounter (HOSPITAL_COMMUNITY): Payer: Self-pay | Admitting: Internal Medicine

## 2020-06-15 ENCOUNTER — Inpatient Hospital Stay (HOSPITAL_COMMUNITY): Payer: Medicare Other

## 2020-06-15 DIAGNOSIS — I480 Paroxysmal atrial fibrillation: Secondary | ICD-10-CM

## 2020-06-15 DIAGNOSIS — R4182 Altered mental status, unspecified: Secondary | ICD-10-CM

## 2020-06-15 DIAGNOSIS — R7881 Bacteremia: Secondary | ICD-10-CM | POA: Diagnosis not present

## 2020-06-15 DIAGNOSIS — B952 Enterococcus as the cause of diseases classified elsewhere: Secondary | ICD-10-CM | POA: Diagnosis not present

## 2020-06-15 DIAGNOSIS — I4891 Unspecified atrial fibrillation: Secondary | ICD-10-CM

## 2020-06-15 LAB — BLOOD CULTURE ID PANEL (REFLEXED) - BCID2

## 2020-06-15 LAB — GLUCOSE, CAPILLARY
Glucose-Capillary: 111 mg/dL — ABNORMAL HIGH (ref 70–99)
Glucose-Capillary: 112 mg/dL — ABNORMAL HIGH (ref 70–99)
Glucose-Capillary: 117 mg/dL — ABNORMAL HIGH (ref 70–99)
Glucose-Capillary: 187 mg/dL — ABNORMAL HIGH (ref 70–99)
Glucose-Capillary: 240 mg/dL — ABNORMAL HIGH (ref 70–99)
Glucose-Capillary: 373 mg/dL — ABNORMAL HIGH (ref 70–99)
Glucose-Capillary: 91 mg/dL (ref 70–99)

## 2020-06-15 LAB — HEPARIN LEVEL (UNFRACTIONATED)
Heparin Unfractionated: <0.1 IU/mL — ABNORMAL LOW (ref 0.30–0.70)
Heparin Unfractionated: <0.1 IU/mL — ABNORMAL LOW (ref 0.30–0.70)

## 2020-06-15 LAB — PROTIME-INR
INR: 1.3 — ABNORMAL HIGH (ref 0.8–1.2)
Prothrombin Time: 15.6 seconds — ABNORMAL HIGH (ref 11.4–15.2)

## 2020-06-15 MED ORDER — AMOXICILLIN 500 MG PO CAPS
500.0000 mg | ORAL_CAPSULE | Freq: Once | ORAL | Status: AC
  Administered 2020-06-15: 500 mg via ORAL
  Filled 2020-06-15: qty 1

## 2020-06-15 MED ORDER — DM-GUAIFENESIN ER 30-600 MG PO TB12: 1.0000 | ORAL_TABLET | Freq: Two times a day (BID) | ORAL | Status: DC | PRN

## 2020-06-15 MED ORDER — SODIUM CHLORIDE 0.9 % IV SOLN
1.0000 g | Freq: Four times a day (QID) | INTRAVENOUS | Status: DC
  Administered 2020-06-15 – 2020-06-16 (×3): 1 g via INTRAVENOUS
  Filled 2020-06-15 (×6): qty 1000

## 2020-06-15 MED ORDER — DIPHENHYDRAMINE HCL 50 MG/ML IJ SOLN: 25.0000 mg | Freq: Once | INTRAMUSCULAR | Status: DC | PRN

## 2020-06-15 MED ORDER — SENNOSIDES-DOCUSATE SODIUM 8.6-50 MG PO TABS: 1.0000 | ORAL_TABLET | Freq: Every evening | ORAL | Status: DC | PRN

## 2020-06-15 MED ORDER — EPINEPHRINE 0.3 MG/0.3ML IJ SOAJ
0.3000 mg | Freq: Once | INTRAMUSCULAR | Status: DC | PRN
  Filled 2020-06-15: qty 0.6

## 2020-06-15 MED ORDER — SODIUM CHLORIDE 0.9 % IV SOLN
2.0000 g | Freq: Two times a day (BID) | INTRAVENOUS | Status: DC
  Administered 2020-06-15 – 2020-06-16 (×2): 2 g via INTRAVENOUS
  Filled 2020-06-15 (×2): qty 2
  Filled 2020-06-15 (×2): qty 20

## 2020-06-15 NOTE — Progress Notes (Signed)
PROGRESS NOTE    Scott Rios  RUE:[id number] DOB: 05/22/1956 DOA: 2020/07/01 PCP: Elfredia Nevins, MD   Brief Narrative:  64 y.o. male with PMH significant for DM2, HTN, smoker, EtOH use, CAD, severe aortic stenosis and insufficiency s/p CABG and aortic valve replacement and mitral valve annuloplasty, acute systolic heart failure, depression, post op recurrent afibb and was placed on warfarin for atleast 3 months who is admitted to [facility] with acute encephalopathy. Found to have enterococcus bacteremia/sepsis and embolic appearing strokes.  Patient transferred to [facility] for further care.   Assessment & Plan:   Principal Problem:   Acute CVA (cerebrovascular accident) / Septic Emboli Vs Afib Related CVAs Active Problems:   Tobacco abuse   Mitral regurgitation   S/P CABG x 6   Malnutrition of moderate degree   CAD (coronary artery disease)   S/P mitral valve replacement   Atrial fibrillation (HCC)   Status post aortic valve replacement with bioprosthetic valve   Current use of long term anticoagulation   Enterococcus Fecalis bacteremia   Sepsis due to Enterococcus-Enterococcus faecalis bacteremia and sepsis  Multifocal bilateral acute embolic CVA, largest in right posterior frontal cortex with unresponsive episodes -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  Aortic valve replaced/repaired -MRA head and neck negative for large vessel occlusion, 30% right M1 narrowing -Neurology following -Will need TEE, planned for tomorrow.  -EEG -Keppra 500 mg twice daily IV -On heparin drip  Sepsis secondary to Enterococcus faecalis bacteremia CABG X6, mitral valve repair, aortic valve replacement August 2021 -Antibiotics-vancomycin  -Infectious disease consulted -If TEE  positive, CT surgery will need to be involved  -Repeat blood cultures tomorrow morning, continue surveillance cultures until they are negative  Diabetes mellitus type 2 -Insulin sliding scale Accu-Chek.  Levemir 10 units daily.  Hyponatremia/hypokalemia -Morning labs are currently pending  DVT prophylaxis: Heparin drip Code Status: DNR Family Communication:    Status is: Inpatient  Remains inpatient appropriate because:Inpatient level of care appropriate due to severity of illness   Dispo: The patient is from: Home              Anticipated d/c is to: SNF              Anticipated d/c date is: 3 days              Patient currently is not medically stable to d/c. On going eval for endocarditis.        Body mass index is 19.96 kg/m.  Pressure Injury 04/12/20 Buttocks Right Stage 2 -  Partial thickness loss of dermis presenting as a shallow open injury with a red, pink wound bed without slough. (Active)  04/12/20 0800  Location: Buttocks  Location Orientation: Right  Staging: Stage 2 -  Partial thickness loss of dermis presenting as a shallow open injury with a red, pink wound bed without slough.  Wound Description (Comments):   Present on Admission:      Pressure Injury 06/13/20 Coccyx Mid Stage 2 -  Partial thickness loss of dermis presenting as a shallow open injury with a red, pink wound bed without slough. small open area (Active)  06/13/20 0800  Location: Coccyx  Location Orientation: Mid  Staging: Stage 2 -  Partial thickness loss of dermis presenting as a shallow open injury with a red, pink wound bed without slough.  Wound Description (  Comments): small open area  Present on Admission:       Subjective: Feels tired but no other complaints. No acute events overnight.   Review of Systems Otherwise negative except as per HPI, including: General: Denies fever, chills, night sweats or unintended weight loss. Resp: Denies cough, wheezing, shortness of  breath. Cardiac: Denies chest pain, palpitations, orthopnea, paroxysmal nocturnal dyspnea. GI: Denies abdominal pain, nausea, vomiting, diarrhea or constipation GU: Denies dysuria, frequency, hesitancy or incontinence MS: Denies muscle aches, joint pain or swelling Neuro: Denies headache, neurologic deficits (focal weakness, numbness, tingling), abnormal gait Psych: Denies anxiety, depression, SI/HI/AVH Skin: Denies new rashes or lesions ID: Denies sick contacts, exotic exposures, travel  Examination:  Constitutional: Not in acute distress Respiratory: Clear to auscultation bilaterally Cardiovascular: Normal sinus rhythm, no rubs Abdomen: Nontender nondistended good bowel sounds Musculoskeletal: No edema noted Skin: No rashes seen Neurologic: Slightly slow to respond, strength 4/5 in all extremities.  Limited participation from patient Psychiatric: Drowsy   Objective: Vitals:   06/14/20 2255 06/15/20 0014 06/15/20 0330 06/15/20 0531  BP:  108/64 111/67 130/76  Pulse:  90 84 92  Resp:  20 20 20   Temp: 99 F (37.2 C) 97.7 F (36.5 C) 97.6 F (36.4 C)   TempSrc:  Oral Axillary   SpO2:  96% 96% 98%  Weight:      Height:        Intake/Output Summary (Last 24 hours) at 06/15/2020 0618 Last data filed at 06/14/2020 1700 Gross per 24 hour  Intake 1483.28 ml  Output 700 ml  Net 783.28 ml   Filed Weights   06/21/2020 1305 06/12/20 0914  Weight: 59 kg 57.8 kg     Data Reviewed:   CBC: Recent Labs  Lab 06/21/2020 1418 06/12/20 0608 06/14/20 0556  WBC 21.7* 21.2* 20.8*  NEUTROABS 20.2*  --   --   HGB 10.5* 9.6* 8.5*  HCT 32.4* 29.1* 26.2*  MCV 82.9 83.6 82.6  PLT 191 175 238   Basic Metabolic Panel: Recent Labs  Lab 2020-06-21 1418 06/12/20 0608 06/14/20 0556  NA 128* 132* 128*  K 3.8 3.3* 3.1*  CL 90* 95* 94*  CO2 30 30 27   GLUCOSE 425* 96 104*  BUN 12 13 12   CREATININE 0.51* 0.46* 0.36*  CALCIUM 8.4* 8.4* 7.9*  MG 1.8  --   --    GFR: Estimated  Creatinine Clearance: 75.3 mL/min (A) (by C-G formula based on SCr of 0.36 mg/dL (L)). Liver Function Tests: Recent Labs  Lab Jun 21, 2020 1418  AST 18  ALT 23  ALKPHOS 139*  BILITOT 0.9  PROT 6.0*  ALBUMIN 2.4*   No results for input(s): LIPASE, AMYLASE in the last 168 hours. No results for input(s): AMMONIA in the last 168 hours. Coagulation Profile: Recent Labs  Lab Jun 21, 2020 1418 06/12/20 0608 06/13/20 0556 06/14/20 0556  INR 1.9* 2.0* 3.1* 1.4*   Cardiac Enzymes: No results for input(s): CKTOTAL, CKMB, CKMBINDEX, TROPONINI in the last 168 hours. BNP (last 3 results) No results for input(s): PROBNP in the last 8760 hours. HbA1C: No results for input(s): HGBA1C in the last 72 hours. CBG: Recent Labs  Lab 06/14/20 1120 06/14/20 1628 06/14/20 2016 06/15/20 0033 06/15/20 0406  GLUCAP 203* 269* 177* 112* 91   Lipid Profile: No results for input(s): CHOL, HDL, LDLCALC, TRIG, CHOLHDL, LDLDIRECT in the last 72 hours. Thyroid Function Tests: No results for input(s): TSH, T4TOTAL, FREET4, T3FREE, THYROIDAB in the last 72 hours. Anemia Panel: No results for input(s):  VITAMINB12, FOLATE, FERRITIN, TIBC, IRON, RETICCTPCT in the last 72 hours. Sepsis Labs: Recent Labs  Lab 06/02/2020 1418 05/19/2020 1556  LATICACIDVEN 1.6 1.5    Recent Results (from the past 240 hour(s))  Respiratory Panel by RT PCR (Flu A&B, Covid) - Nasopharyngeal Swab     Status: None   Collection Time: 06/10/2020  1:36 PM   Specimen: Nasopharyngeal Swab  Result Value Ref Range Status   SARS Coronavirus 2 by RT PCR NEGATIVE NEGATIVE Final    Comment: (NOTE) SARS-CoV-2 target nucleic acids are NOT DETECTED.  The SARS-CoV-2 RNA is generally detectable in upper respiratoy specimens during the acute phase of infection. The lowest concentration of SARS-CoV-2 viral copies this assay can detect is 131 copies/mL. A negative result does not preclude SARS-Cov-2 infection and should not be used as the sole basis  for treatment or other patient management decisions. A negative result may occur with  improper specimen collection/handling, submission of specimen other than nasopharyngeal swab, presence of viral mutation(s) within the areas targeted by this assay, and inadequate number of viral copies (<131 copies/mL). A negative result must be combined with clinical observations, patient history, and epidemiological information. The expected result is Negative.  Fact Sheet for Patients:  https://www.moore.com/  Fact Sheet for Healthcare Providers:  https://www.young.biz/  This test is no t yet approved or cleared by the [place] FDA and  has been authorized for detection and/or diagnosis of SARS-CoV-2 by FDA under an Emergency Use Authorization (EUA). This EUA will remain  in effect (meaning this test can be used) for the duration of the COVID-19 declaration under Section 564(b)(1) of the Act, 21 U.S.C. section 360bbb-3(b)(1), unless the authorization is terminated or revoked sooner.     Influenza A by PCR NEGATIVE NEGATIVE Final   Influenza B by PCR NEGATIVE NEGATIVE Final    Comment: (NOTE) The Xpert Xpress SARS-CoV-2/FLU/RSV assay is intended as an aid in  the diagnosis of influenza from Nasopharyngeal swab specimens and  should not be used as a sole basis for treatment. Nasal washings and  aspirates are unacceptable for Xpert Xpress SARS-CoV-2/FLU/RSV  testing.  Fact Sheet for Patients: https://www.moore.com/  Fact Sheet for Healthcare Providers: https://www.young.biz/  This test is not yet approved or cleared by the [place] FDA and  has been authorized for detection and/or diagnosis of SARS-CoV-2 by  FDA under an Emergency Use Authorization (EUA). This EUA will remain  in effect (meaning this test can be used) for the duration of the  Covid-19 declaration under Section 564(b)(1) of the Act, 21   U.S.C. section 360bbb-3(b)(1), unless the authorization is  terminated or revoked. Performed at [facility], [address], [place], [place] [address]   Culture, blood (routine x 2)     Status: Abnormal   Collection Time: 05/27/2020  2:22 PM   Specimen: BLOOD LEFT FOREARM  Result Value Ref Range Status   Specimen Description   Final    BLOOD LEFT FOREARM Performed at [facility], [address], [place], [place] [address]    Special Requests   Final    NONE Performed at [facility], [address] [place], [place] [address]    Culture  Setup Time   Final    IN BOTH AEROBIC AND ANAEROBIC BOTTLES GRAM POSITIVE COCCI Gram Stain Report Called to,Read Back By and Verified With: C TURNER,RN@0436  06/12/20 MKELLY    Culture ENTEROCOCCUS FAECALIS (A)  Final   Report Status 06/14/2020 FINAL  Final   Organism  ID, Bacteria ENTEROCOCCUS FAECALIS  Final      Susceptibility   Enterococcus faecalis - MIC*    AMPICILLIN <=2 SENSITIVE Sensitive     VANCOMYCIN 1 SENSITIVE Sensitive     GENTAMICIN SYNERGY SENSITIVE Sensitive     * ENTEROCOCCUS FAECALIS  Blood Culture ID Panel (Reflexed)     Status: Abnormal   Collection Time: 06/28/2020  2:22 PM  Result Value Ref Range Status   Enterococcus faecalis DETECTED (A) NOT DETECTED Final    Comment: CRITICAL RESULT CALLED TO, READ BACK BY AND VERIFIED WITH: G. Coffee PharmD 8:20 06/12/20 ([email or web address])    Enterococcus Faecium NOT DETECTED NOT DETECTED Final   Listeria monocytogenes NOT DETECTED NOT DETECTED Final   Staphylococcus species NOT DETECTED NOT DETECTED Final   Staphylococcus aureus (BCID) NOT DETECTED NOT DETECTED Final   Staphylococcus epidermidis NOT DETECTED NOT DETECTED Final   Staphylococcus lugdunensis NOT DETECTED NOT DETECTED Final   Streptococcus species NOT DETECTED NOT DETECTED Final   Streptococcus agalactiae NOT DETECTED NOT DETECTED Final   Streptococcus pneumoniae NOT DETECTED NOT DETECTED Final   Streptococcus pyogenes  NOT DETECTED NOT DETECTED Final   A.calcoaceticus-baumannii NOT DETECTED NOT DETECTED Final   Bacteroides fragilis NOT DETECTED NOT DETECTED Final   Enterobacterales NOT DETECTED NOT DETECTED Final   Enterobacter cloacae complex NOT DETECTED NOT DETECTED Final   Escherichia coli NOT DETECTED NOT DETECTED Final   Klebsiella aerogenes NOT DETECTED NOT DETECTED Final   Klebsiella oxytoca NOT DETECTED NOT DETECTED Final   Klebsiella pneumoniae NOT DETECTED NOT DETECTED Final   Proteus species NOT DETECTED NOT DETECTED Final   Salmonella species NOT DETECTED NOT DETECTED Final   Serratia marcescens NOT DETECTED NOT DETECTED Final   Haemophilus influenzae NOT DETECTED NOT DETECTED Final   Neisseria meningitidis NOT DETECTED NOT DETECTED Final   Pseudomonas aeruginosa NOT DETECTED NOT DETECTED Final   Stenotrophomonas maltophilia NOT DETECTED NOT DETECTED Final   Candida albicans NOT DETECTED NOT DETECTED Final   Candida auris NOT DETECTED NOT DETECTED Final   Candida glabrata NOT DETECTED NOT DETECTED Final   Candida krusei NOT DETECTED NOT DETECTED Final   Candida parapsilosis NOT DETECTED NOT DETECTED Final   Candida tropicalis NOT DETECTED NOT DETECTED Final   Cryptococcus neoformans/gattii NOT DETECTED NOT DETECTED Final   Vancomycin resistance NOT DETECTED NOT DETECTED Final    Comment: Performed at [facility], [address] [place], [place] [address]  Culture, blood (routine x 2)     Status: Abnormal   Collection Time: June 16, 2020  2:26 PM   Specimen: BLOOD LEFT HAND  Result Value Ref Range Status   Specimen Description   Final    BLOOD LEFT HAND Performed at [facility], [address] [place], [place] [address]    Special Requests   Final    NONE Performed at [facility], [address] [place], [place] [address]    Culture  Setup Time   Final    IN BOTH AEROBIC AND ANAEROBIC BOTTLES GRAM POSITIVE COCCI Gram Stain Report Called to,Read Back By and Verified  With: C TURNER,RN@0436  06/12/20 [facility] Performed at [facility], [address] [place], [place] [address]    Culture (A)  Final    ENTEROCOCCUS FAECALIS SUSCEPTIBILITIES PERFORMED ON PREVIOUS CULTURE WITHIN THE LAST 5 DAYS. Performed at [facility], [address] [place], [place] [address]    Report Status 06/14/2020 FINAL  Final  Urine culture     Status:  Abnormal   Collection Time: 06-18-20  7:26 PM   Specimen: Urine, Clean Catch  Result Value Ref Range Status   Specimen Description   Final    URINE, CLEAN CATCH Performed at [facility], [address], [place], [place] [address]    Special Requests   Final    NONE Performed at [facility], [address] [place], [place] [address]    Culture >=100,000 COLONIES/mL ENTEROCOCCUS FAECALIS (A)  Final   Report Status 06/14/2020 FINAL  Final   Organism ID, Bacteria ENTEROCOCCUS FAECALIS (A)  Final      Susceptibility   Enterococcus faecalis - MIC*    AMPICILLIN <=2 SENSITIVE Sensitive     NITROFURANTOIN <=16 SENSITIVE Sensitive     VANCOMYCIN 1 SENSITIVE Sensitive     * >=100,000 COLONIES/mL ENTEROCOCCUS FAECALIS  Culture, blood (routine x 2)     Status: None (Preliminary result)   Collection Time: 06/13/20  2:52 PM   Specimen: BLOOD  Result Value Ref Range Status   Specimen Description   Final    BLOOD LEFT ANTECUBITAL Performed at [facility], [address], [place], [place] [address]    Special Requests   Final    BOTTLES DRAWN AEROBIC AND ANAEROBIC Blood Culture adequate volume Performed at [facility], [address] [place], [place] [address]    Culture   Final    NO GROWTH 1 DAY Performed at [facility], [address] [place], [place] [address]    Report Status PENDING  Incomplete  Culture, blood (routine x 2)     Status: None (Preliminary result)   Collection Time: 06/13/20  2:52 PM   Specimen: BLOOD  Result Value Ref Range Status   Specimen  Description   Final    BLOOD BLOOD RIGHT HAND Performed at [facility], [address], [place], [place] [address]    Special Requests   Final    BOTTLES DRAWN AEROBIC AND ANAEROBIC Blood Culture adequate volume Performed at [facility], [address] [place], [place] [address]    Culture  Setup Time   Final    GRAM POSITIVE RODS ANAEROBIC BOTTLE ONLY Gram Stain Report Called to,Read Back By and Verified With: Renato Shin @1650  06/14/2020 KAY Organism ID to follow CRITICAL RESULT CALLED TO, READ BACK BY AND VERIFIED WITH: PHRMD V BRYK @0108  06/15/20 BY S GEZAHEGN Performed at [facility], [address] [place], [address] [place]    Culture PENDING  Incomplete   Report Status PENDING  Incomplete  Blood Culture ID Panel (Reflexed)     Status: Abnormal   Collection Time: 06/13/20  2:52 PM  Result Value Ref Range Status   Enterococcus faecalis DETECTED (A) NOT DETECTED Final    Comment: CRITICAL RESULT CALLED TO, READ BACK BY AND VERIFIED WITH: PHRMD V BRYK @0108  06/15/20 BY S GEZAHEGN    Enterococcus Faecium NOT DETECTED NOT DETECTED Final   Listeria monocytogenes NOT DETECTED NOT DETECTED Final   Staphylococcus species NOT DETECTED NOT DETECTED Final   Staphylococcus aureus (BCID) NOT DETECTED NOT DETECTED Final   Staphylococcus epidermidis NOT DETECTED NOT DETECTED Final   Staphylococcus lugdunensis NOT DETECTED NOT DETECTED Final   Streptococcus species NOT DETECTED NOT DETECTED Final   Streptococcus agalactiae NOT DETECTED NOT DETECTED Final   Streptococcus pneumoniae NOT DETECTED NOT DETECTED Final   Streptococcus pyogenes NOT DETECTED NOT DETECTED Final   A.calcoaceticus-baumannii NOT DETECTED NOT  DETECTED Final   Bacteroides fragilis NOT DETECTED NOT DETECTED Final   Enterobacterales NOT DETECTED NOT DETECTED Final   Enterobacter cloacae complex NOT DETECTED NOT DETECTED Final   Escherichia coli NOT  DETECTED NOT DETECTED Final   Klebsiella aerogenes NOT DETECTED NOT DETECTED Final   Klebsiella oxytoca NOT DETECTED NOT DETECTED Final   Klebsiella pneumoniae NOT DETECTED NOT DETECTED Final   Proteus species NOT DETECTED NOT DETECTED Final   Salmonella species NOT DETECTED NOT DETECTED Final   Serratia marcescens NOT DETECTED NOT DETECTED Final   Haemophilus influenzae NOT DETECTED NOT DETECTED Final   Neisseria meningitidis NOT DETECTED NOT DETECTED Final   Pseudomonas aeruginosa NOT DETECTED NOT DETECTED Final   Stenotrophomonas maltophilia NOT DETECTED NOT DETECTED Final   Candida albicans NOT DETECTED NOT DETECTED Final   Candida auris NOT DETECTED NOT DETECTED Final   Candida glabrata NOT DETECTED NOT DETECTED Final   Candida krusei NOT DETECTED NOT DETECTED Final   Candida parapsilosis NOT DETECTED NOT DETECTED Final   Candida tropicalis NOT DETECTED NOT DETECTED Final   Cryptococcus neoformans/gattii NOT DETECTED NOT DETECTED Final   Vancomycin resistance NOT DETECTED NOT DETECTED Final    Comment: Performed at [facility], [address] [place], [place] [address]         Radiology Studies: CT HEAD WO CONTRAST  Result Date: 06/13/2020 CLINICAL DATA:  Encephalopathy EXAM: CT HEAD WITHOUT CONTRAST TECHNIQUE: Contiguous axial images were obtained from the base of the skull through the vertex without intravenous contrast. COMPARISON:  06/08/2020 FINDINGS: Brain: Multifocal hypoattenuation corresponding to recent infarcts identified on MRI. No hemorrhage. No midline shift or other mass effect. Vascular: Atherosclerotic calcification of the internal carotid arteries at the skull base. No abnormal hyperdensity of the major intracranial arteries or dural venous sinuses. Skull: The visualized skull base, calvarium and extracranial soft tissues are normal. Sinuses/Orbits: No fluid levels or advanced mucosal thickening of the visualized paranasal sinuses. No mastoid or middle ear  effusion. The orbits are normal. IMPRESSION: Multifocal hypoattenuation corresponding to recent infarcts identified on MRI. No hemorrhage or mass effect. Electronically Signed   By: Deatra RobinsonKevin  Herman M.D.   On: 06/13/2020 23:31        Scheduled Meds: . atorvastatin  80 mg Oral Daily  . insulin aspart  0-15 Units Subcutaneous Q4H  . insulin detemir  10 Units Subcutaneous Daily  . metoprolol tartrate  2.5 mg Intravenous Q8H  . multivitamin with minerals  1 tablet Oral Daily  . tamsulosin  0.4 mg Oral Daily  . thiamine injection  100 mg Intravenous Daily   Or  . thiamine  100 mg Oral Daily  . Warfarin - Pharmacist Dosing Inpatient   Does not apply q1600   Continuous Infusions: . sodium chloride 40 mL/hr at 06/14/20 2139  . heparin 1,100 Units/hr (06/14/20 2322)  . levETIRAcetam 500 mg (06/14/20 2136)  . vancomycin 750 mg (06/15/20 0036)     LOS: 4 days   Time spent= 35 mins    Keon Benscoter Joline Maxcyhirag Briannah Lona, MD [facility]  If 7PM-7AM, please contact night-coverage  06/15/2020, 6:18 AM

## 2020-06-15 NOTE — Significant Event (Signed)
Rapid Response Event Note   Reason for Call :  unresponsiveness  Initial Focused Assessment:  Per RN he was alert and oriented this am.  But is now suddenly unresponsive to sternal rub. Upon my arrival he is reclined in the chair.  Skin is pale. He has EEG currently in progress He awakes suddenly.   BP  131/79  HR 96  RR 20  O2 sat 96% on RA    Interventions:  Assisted to bed 146/72  HR 95  RR 19  O2 sat 97% on RA  He is alert asking for something to drink.  His only complaint is that he is cold. Dr Nelson Chimes at bedside to assess patient.  Plan of Care:  RN to call if assistance needed.   Event Summary:   MD NotifiedNelson Chimes Call Time: 1033 Arrival Time: 1035 End Time: 1100  Marcellina Millin, RN

## 2020-06-15 NOTE — Progress Notes (Signed)
LTM EEG hooked up and running - no initial skin breakdown - push button tested - neuro notified. Same Leads used

## 2020-06-15 NOTE — Progress Notes (Signed)
Pt was unresponsive, after vocalizing wanting to reposition hisself, by the RN. RN and Consulting civil engineer was unable to arouse pt. Rapid Response was called and MD was called. RR Nurse came and pt became arousable and answered all questions. Pt is now stable and RN will continue to monitor.

## 2020-06-15 NOTE — Progress Notes (Signed)
ANTICOAGULATION CONSULT NOTE  Pharmacy Consult for Heparin Indication: atrial fibrillation/valve replacements  Patient Measurements: Height: 5\' 7"  (170.2 cm) Weight: 57.8 kg (127 lb 6.8 oz) IBW/kg (Calculated) : 66.1 Heparin Dosing Weight: HEPARIN DW (KG): 57.8  Vital Signs: Temp: 97.9 F (36.6 C) (11/01 1135) Temp Source: Oral (11/01 1135) BP: 141/74 (11/01 1135) Pulse Rate: 93 (11/01 1135)  Labs: Recent Labs    06/13/20 0556 06/14/20 0556 06/14/20 2150 06/15/20 0913 06/15/20 1816  HGB  --  8.5*  --   --   --   HCT  --  26.2*  --   --   --   PLT  --  238  --   --   --   LABPROT 30.7* 16.9*  --  15.6*  --   INR 3.1* 1.4*  --  1.3*  --   HEPARINUNFRC  --   --  <0.10* <0.10* <0.10*  CREATININE  --  0.36*  --   --   --     Estimated Creatinine Clearance: 75.3 mL/min (A) (by C-G formula based on SCr of 0.36 mg/dL (L)).   Assessment: Scott Rios on warfarin PTA for hx Afib/AVR transitioned to Heparin while awaiting further work-up of strokes and bacteremia. Pharmacy consulted to dose.   Heparin level this evening remains SUBtherapeutic (undetectable) despite a rate increase earlier today. Hgb last check 10/31 was 8.5, plt 238. No reported interruptions to infusion - heparin is running in R arm and level drawn from L arm.   Goal of Therapy:  HL 0.3-0.5 Monitor platelets by anticoagulation protocol: Yes   Plan:  - Increase Heparin to 1400 units/hr (14 ml/hr) - Will continue to monitor for any signs/symptoms of bleeding and will follow up with heparin level in 6 hours   Thank you for allowing pharmacy to be a part of this patient's care.  11/31, PharmD, BCCCP Clinical Pharmacist  Phone: [phone] 06/15/2020 7:23 PM  Please check AMION for all [facility] Pharmacy phone numbers After 10:00 PM, call Main Pharmacy [phone]

## 2020-06-15 NOTE — Progress Notes (Signed)
Neurology Progress Note Patient Name: Scott Rios MRN: [id number]  S: No overnight events or new complaints except for being cold.  O: Current vital signs: BP 111/67 (BP Location: Left Arm)   Pulse 84   Temp 97.6 F (36.4 C) (Axillary)   Resp 20   Ht 5\' 7"  (1.702 m)   Wt 57.8 kg   SpO2 96%   BMI 19.96 kg/m  Vital signs in last 24 hours: Temp:  [97.6 F (36.4 C)-100.6 F (38.1 C)] 97.6 F (36.4 C) (11/01 0330) Pulse Rate:  [84-99] 84 (11/01 0330) Resp:  [16-20] 20 (11/01 0330) BP: (108-155)/(59-88) 111/67 (11/01 0330) SpO2:  [93 %-98 %] 96 % (11/01 0330) GENERAL: Awake, alert in NAD HEENT: Normocephalic and atraumatic, dry mm, no LN++, no Thyromegally LUNGS: Bilateral equal excursions no wheezes CV: RRR, equal pulses bilaterally. ABDOMEN: Soft, nontender, nondistended with normoactive BS Ext: warm, well perfused, intact peripheral pulses  NEURO:  Mental Status: patient is awake and alert quickly arouses to soft voice. Language: speech is minimal. Answers some questions with good repetition, fluency, and comprehension seems intact. Cranial Nerves: PERRL ~22mm reactive. EOMI, visual fields full, inact facial sensation to temperature, hearing intact, tongue/uvula/soft palate midline, normal sternocleidomastoid and trapezius muscle strength. No evidence of tongue atrophy or fibrillations. Motor: Patient is thin/cachectic Tone: is normal and bulk is normal. Good strength when encouraged. Sensation - Intact to light touch bilaterally. Coordination: Too cold and patient did not want to uncover and could not perform. Gait- deferred  NIHSS ~7 patient was very cold and the exam was limited.  Medications  Current Facility-Administered Medications:  .  0.9 %  sodium chloride infusion, , Intravenous, Continuous, Emokpae, Courage, MD, Last Rate: 40 mL/hr at 06/14/20 2139, New Bag at 06/14/20 2139 .  acetaminophen (TYLENOL) tablet 650 mg, 650 mg, Oral, Q4H PRN **OR** acetaminophen  (TYLENOL) 160 MG/5ML solution 650 mg, 650 mg, Per Tube, Q4H PRN **OR** acetaminophen (TYLENOL) suppository 650 mg, 650 mg, Rectal, Q4H PRN, Elgergawy, 2140, MD, 650 mg at 06/12/20 0007 .  atorvastatin (LIPITOR) tablet 80 mg, 80 mg, Oral, Daily, Elgergawy, 06/14/20, MD, 80 mg at 06/14/20 0855 .  dextrose 50 % solution 0-50 mL, 0-50 mL, Intravenous, PRN, Elgergawy, 07-27-1975, MD, 50 mL at 2020/07/02 2357 .  heparin ADULT infusion 100 units/mL (25000 units/261mL sodium chloride 0.45%), 1,100 Units/hr, Intravenous, Continuous, Memon, Jehanzeb, MD, Last Rate: 11 mL/hr at 06/14/20 2322, 1,100 Units/hr at 06/14/20 2322 .  insulin aspart (novoLOG) injection 0-15 Units, 0-15 Units, Subcutaneous, Q4H, Elgergawy, 10-14-1984, MD, 3 Units at 06/14/20 2041 .  insulin detemir (LEVEMIR) injection 10 Units, 10 Units, Subcutaneous, Daily, 2042, MD, 10 Units at 06/14/20 0855 .  levETIRAcetam (KEPPRA) IVPB 500 mg/100 mL premix, 500 mg, Intravenous, Q12H, 06/30/2020, MD, Last Rate: 400 mL/hr at 06/14/20 2136, 500 mg at 06/14/20 2136 .  metoprolol tartrate (LOPRESSOR) injection 2.5 mg, 2.5 mg, Intravenous, Q8H, Emokpae, Courage, MD, 2.5 mg at 06/14/20 2131 .  multivitamin with minerals tablet 1 tablet, 1 tablet, Oral, Daily, Emokpae, Courage, MD, 1 tablet at 06/14/20 0856 .  tamsulosin (FLOMAX) capsule 0.4 mg, 0.4 mg, Oral, Daily, Elgergawy, 07/12/2020, MD, 0.4 mg at 06/14/20 0855 .  thiamine (B-1) injection 100 mg, 100 mg, Intravenous, Daily, 100 mg at 06/14/20 1257 **OR** thiamine tablet 100 mg, 100 mg, Oral, Daily, 07/01/2020, MD .  vancomycin (VANCOREADY) IVPB 750 mg/150 mL, 750 mg, Intravenous, Q12H, Emokpae, Courage, MD, Last Rate: 150 mL/hr at  06/15/20 0036, 750 mg at 06/15/20 0036 .  Warfarin - Pharmacist Dosing Inpatient, , Does not apply, q1600, Elgergawy, Leana Roe, MD Labs     Component Value Date/Time   WBC 20.8 (H) 06/14/2020 0556   RBC 3.17 (L) 06/14/2020 0556   HGB 8.5 (L)  06/14/2020 0556   HCT 26.2 (L) 06/14/2020 0556   PLT 238 06/14/2020 0556   MCV 82.6 06/14/2020 0556   MCH 26.8 06/14/2020 0556   MCHC 32.4 06/14/2020 0556   RDW 15.6 (H) 06/14/2020 0556   LYMPHSABS 0.6 (L) 07-01-2020 1418   MONOABS 0.6 2020/07/01 1418   EOSABS 0.0 07-01-2020 1418   BASOSABS 0.1 07-01-2020 1418         Component Value Date/Time   NA 128 (L) 06/14/2020 0556   K 3.1 (L) 06/14/2020 0556   CL 94 (L) 06/14/2020 0556   CO2 27 06/14/2020 0556   GLUCOSE 104 (H) 06/14/2020 0556   BUN 12 06/14/2020 0556   CREATININE 0.36 (L) 06/14/2020 0556   CALCIUM 7.9 (L) 06/14/2020 0556   PROT 6.0 (L) Jul 01, 2020 1418   ALBUMIN 2.4 (L) 2020/07/01 1418   AST 18 2020/07/01 1418   ALT 23 01-Jul-2020 1418   ALKPHOS 139 (H) 2020/07/01 1418   BILITOT 0.9 07-01-20 1418   GFRNONAA >60 06/14/2020 0556   GFRAA >60 04/30/2020 1102       Component Value Date/Time   CHOL 62 06/12/2020 0608   TRIG 61 06/12/2020 0608   HDL 19 (L) 06/12/2020 0608   CHOLHDL 3.3 06/12/2020 0608   VLDL 12 06/12/2020 0608   LDLCALC 31 06/12/2020 0608    Imaging I have reviewed images in epic and the results pertinent to this consultation are:  MRI Brain showed scattered acute infarctions - cerebellum, b/l cerebral hemispheres 2/2 embolic phenomenon. Largest being ~1.5cm in right posterior frontal cortical and subcortical brain. MRA head and neck did not show large/medium vessel occlusion significant proximal stenosis, ~30% right M1 narrowing. US carotid BL <50% stenosis.  Other diagnostic studies: Echocardiogram 55 to 60%, no mention of shunt or thrombus did not show evidence of vegetations.  Assessment: Scott Rios is a 64 y.o. man with PMHx DM2, HTN, smoker, EtOH, CAD, severe aortic stenosis/insufficiency s/p CABG/aortic valve replacement/mitral valve annuloplasty, acute systolic heart failure, depression, post op recurrent afib and was placed on warfarin ~3 months transferred from [facility]  with acute encephalopathy. Found to have enterococcus bacteremia/sepsis and embolic appearing strokes. Scheduled to have TEE in the morning.  Impression: Bilateral cerebral/cerebellar infarcts Acute encephalopathy Enterococcal urosepsis Chronic atrial fibrillation DM 2 HTN Significant cardiac/structural disease   Plan: - Frequent Neuro checks per stroke unit protocol - Monitor closely as infective endocarditis/septic emboli remains in the differentia and patient is on heparin. - TEE planned today. - NPO for procedure. - Continue anticoagulation. - SBP goal - gradual normotension. - Normoglycemia if possible <160 - Continue telemetry monitoring per protocol. - Stroke education booklet. - Recommend PT/OT/SLP consult  Marisue Humble, MD Page: [phone]

## 2020-06-15 NOTE — Progress Notes (Signed)
EEG complete - results pending LTM to follow same leads used  

## 2020-06-15 NOTE — Progress Notes (Signed)
Received Pt from [facility], via carelink, alert but minimal in conversation, oriented to the unit, placed on tele and verified, will continue to monitor, NPO as per MD order.

## 2020-06-15 NOTE — Evaluation (Signed)
Speech Language Pathology Evaluation Patient Details Name: Scott Rios MRN: [id number] DOB: 1955/09/08 Today's Date: 06/15/2020 Time: 1345-1410 SLP Time Calculation (min) (ACUTE ONLY): 25 min  Problem List:  Patient Active Problem List   Diagnosis Date Noted  . Enterococcus Fecalis bacteremia 06/12/2020  . Sepsis due to Enterococcus-Enterococcus faecalis bacteremia and sepsis 06/12/2020  . Acute CVA (cerebrovascular accident) / Septic Emboli Vs Afib Related CVAs 11-Jul-2020  . Altered mental status   . S/P mitral valve replacement 04/22/2020  . Atrial fibrillation (HCC) 04/22/2020  . Status post aortic valve replacement with bioprosthetic valve 04/22/2020  . Current use of long term anticoagulation 04/22/2020  . CAD (coronary artery disease)   . Pressure injury of skin 04/12/2020  . Adjustment disorder with depressed mood 04/11/2020  . Malnutrition of moderate degree 04/10/2020  . S/P CABG x 6 04/09/2020  . Non-ST elevation (NSTEMI) myocardial infarction (HCC)   . Right leg weakness 03/28/2020  . Flash pulmonary edema (HCC) 03/28/2020  . Hypomagnesemia 03/28/2020  . Hypoalbuminemia 03/28/2020  . Hyperglycemia 03/28/2020  . Hyponatremia 03/28/2020  . Elevated troponin 03/28/2020  . Pulmonary nodule 03/28/2020  . Acute respiratory failure with hypoxia (HCC) 03/28/2020  . Tobacco abuse 03/28/2020  . Acute pulmonary edema (HCC)   . Weakness of right lower extremity   . Acute systolic heart failure (HCC)   . Mitral regurgitation   . Nonrheumatic aortic valve insufficiency    Past Medical History:  Past Medical History:  Diagnosis Date  . Diabetes mellitus without complication (HCC)   . Hypertension    Past Surgical History:  Past Surgical History:  Procedure Laterality Date  . AORTIC VALVE REPLACEMENT N/A 04/09/2020   Procedure: AORTIC VALVE REPLACEMENT (AVR) USING EDWARDS INSPIRIS RESILIA 23 MM AORTIC VALVE.;  Surgeon: Alleen Borne, MD;  Location: [facility] OR;  Service:  Open Heart Surgery;  Laterality: N/A;  . CHOLECYSTECTOMY    . CLIPPING OF ATRIAL APPENDAGE Left 04/09/2020   Procedure: CLIPPING OF ATRIAL APPENDAGE USING  ATRICURE [id number] ATRICLIP;  Surgeon: Alleen Borne, MD;  Location: [facility] OR;  Service: Open Heart Surgery;  Laterality: Left;  . CORONARY ARTERY BYPASS GRAFT N/A 04/09/2020   Procedure: CORONARY ARTERY BYPASS GRAFTING (CABG) X 6 USING LIMA TO LAD; ENDOSCOPICALLY HARVESTED RIGHT GREATER SAPHENOUS VEIN: SVG to D1; SVG seq to OM2 and OM3; SVG seq to PD and PL. AORTIC VALVE REPLACEMENT USING INSPIRIS RESILIA 23 MM AORTIC VALVE. 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 LEFT ATRIAL APPENDAGE CLIPPING USING ATRICURE PRO240 40 MM CLIP.;  Surgeon: Alleen Borne, MD;   . ENDOVEIN HARVEST OF GREATER SAPHENOUS VEIN Right 04/09/2020   Procedure: ENDOVEIN HARVEST OF GREATER SAPHENOUS VEIN;  Surgeon: Alleen Borne, MD;  Location: [facility] OR;  Service: Open Heart Surgery;  Laterality: Right;  . HERNIA REPAIR    . MITRAL VALVE REPAIR N/A 04/09/2020   Procedure: MITRAL VALVE REPLACEMENT (MVR) USING MEMO 4D 28 MM MITRAL VALVE;  Surgeon: Alleen Borne, MD;  Location: [facility] OR;  Service: Open Heart Surgery;  Laterality: N/A;  . MULTIPLE EXTRACTIONS WITH ALVEOLOPLASTY N/A 04/03/2020   Procedure: Extraction of tooth #'s 11,22,23,26,27,28,29 with alveoloplasty and bilateral mandibular tori reductions;  Surgeon: Charlynne Pander, DDS;  Location: [facility] OR;  Service: Oral Surgery;  Laterality: N/A;  . RIGHT/LEFT HEART CATH AND CORONARY ANGIOGRAPHY N/A 03/30/2020   Procedure: RIGHT/LEFT HEART CATH AND CORONARY ANGIOGRAPHY;  Surgeon: Corky Crafts, MD;  Location: [facility] INVASIVE CV LAB;  Service: Cardiovascular;  Laterality: N/A;  . TEE WITHOUT CARDIOVERSION N/A 03/31/2020   Procedure: TRANSESOPHAGEAL ECHOCARDIOGRAM (TEE);  Surgeon: Jodelle Red, MD;  Location: [facility] ENDOSCOPY;  Service: Cardiovascular;  Laterality: N/A;  . TEE WITHOUT CARDIOVERSION N/A 04/09/2020    Procedure: TRANSESOPHAGEAL ECHOCARDIOGRAM (TEE);  Surgeon: Alleen Borne, MD;  Location: [facility] OR;  Service: Open Heart Surgery;  Laterality: N/A;   HPI:  Scott Rios  is a 64 y.o. male,  with a history of DM 2, HTN, ongoing heavy smoking and COPD, previous EtOH abuse.  Other history includes multivessel coronary artery disease, severe aortic stenosis with insufficiency, mitral valve insufficiency, status post CABG x6, status post aortic valve replacement, status post mitral valve annuloplasty, acute pulmonary edema, acute systolic heart failure, history of depression, postoperative atrial fibrillation, hyperglycemia.   Assessment / Plan / Recommendation Clinical Impression  Pt was seen for SLE following dysphagia treatment. He is alert and OX4. Areas of impairment include memory and verbal expression. His memory is characterized by a decreased recall of new information with both storage and retrieval deficits observed. His verbal expression is fluent, but ocassional word-finding difficulties were noted. He demonstrated both semantic and phonemic paraphasias during a confrontational naming task. Impaired repetition was also observed at the phrase and sentence level. SLP will f/u acutely for cognitive-lingustic treatment.    SLP Assessment  SLP Recommendation/Assessment: Patient needs continued Speech Lanaguage Pathology Services SLP Visit Diagnosis: Aphasia (R47.01);Cognitive communication deficit (R41.841)    Follow Up Recommendations  24 hour supervision/assistance;Skilled Nursing facility    Frequency and Duration min 2x/week  2 weeks      SLP Evaluation Cognition  Overall Cognitive Status: Impaired/Different from baseline Arousal/Alertness: Awake/alert Orientation Level: Oriented X4 Attention: Focused;Sustained Focused Attention: Appears intact Sustained Attention: Appears intact Memory: Impaired Memory Impairment: Decreased recall of new information;Storage deficit;Retrieval  deficit Awareness: Appears intact Executive Function: Self Monitoring Self Monitoring: Appears intact       Comprehension  Auditory Comprehension Overall Auditory Comprehension: Appears within functional limits for tasks assessed Yes/No Questions: Within Functional Limits Commands: Within Functional Limits Conversation: Simple Visual Recognition/Discrimination Discrimination: Not tested Reading Comprehension Reading Status: Not tested    Expression Expression Primary Mode of Expression: Verbal Verbal Expression Overall Verbal Expression: Impaired Initiation: No impairment Automatic Speech: Name;Social Response;Day of week Level of Generative/Spontaneous Verbalization: Conversation Repetition: Impaired Level of Impairment: Phrase level;Sentence level Naming: Impairment Responsive: 76-100% accurate Confrontation: Impaired Convergent: 75-100% accurate Verbal Errors: Semantic paraphasias;Phonemic paraphasias   Oral / Motor  Motor Speech Overall Motor Speech: Appears within functional limits for tasks assessed Respiration: Within functional limits Phonation: Wet (Ocassional wetness during meals) Resonance: Within functional limits Articulation: Within functional limitis Intelligibility: Intelligible Motor Planning: Not tested Motor Speech Errors: Not applicable   GO                    Royetta Crochet 06/15/2020, 2:31 PM

## 2020-06-15 NOTE — Progress Notes (Signed)
  Speech Language Pathology Treatment: Dysphagia  Patient Details Name: Scott Rios MRN: [id number] DOB: 17-Apr-1956 Today's Date: 06/15/2020 Time: 1345-1410 SLP Time Calculation (min) (ACUTE ONLY): 25 min  Assessment / Plan / Recommendation Clinical Impression  Pt was seen for dysphagia treatment during lunch. Pt was coughing when SLP entered the room, but with careful feeding, no further coughing was observed. He demonstrated a wet vocal quality ocassionally, and with verbal cues to clear his throat, wetness was cleared. Liquid washes also reduced wet vocal quality. Recommend pt continue with dys 2/thin. SLP will f/u acutely for diet toleration and advancement.   HPI HPI: Scott Rios  is a 64 y.o. male,  with a history of DM 2, HTN, ongoing heavy smoking and COPD, previous EtOH abuse.  Other history includes multivessel coronary artery disease, severe aortic stenosis with insufficiency, mitral valve insufficiency, status post CABG x6, status post aortic valve replacement, status post mitral valve annuloplasty, acute pulmonary edema, acute systolic heart failure, history of depression, postoperative atrial fibrillation, hyperglycemia.      SLP Plan  Continue with current plan of care       Recommendations  Diet recommendations: Dysphagia 2 (fine chop);Thin liquid Liquids provided via: Cup;Straw Medication Administration: Whole meds with liquid Supervision: Staff to assist with self feeding;Intermittent supervision to cue for compensatory strategies Compensations: Minimize environmental distractions;Slow rate;Small sips/bites Postural Changes and/or Swallow Maneuvers: Seated upright 90 degrees                Oral Care Recommendations: Oral care BID Follow up Recommendations: 24 hour supervision/assistance;Skilled Nursing facility SLP Visit Diagnosis: Dysphagia, unspecified (R13.10) Plan: Continue with current plan of care       GO                Royetta Crochet 06/15/2020,  2:21 PM

## 2020-06-15 NOTE — Evaluation (Signed)
Occupational Therapy Evaluation Patient Details Name: Scott Rios MRN: [id number] DOB: 1956/07/18 Today's Date: 06/15/2020    History of Present Illness 64 y.o. male presenting to ED with poor gait, poor oral intake, and confusion. CT head showing acute infarct in left periventricular area. EKG showing A. fib with RVR. chest x-ray with no acute finding. PMH including DM 2, HTN, ongoing heavy smoking, COPD, previous EtOH abuse, multivessel CAD, severe aortic stenosis with insufficiency, mitral valve insufficiency, s/p CABG x6, s/p aortic valve replacement, s/p mitral valve annuloplasty, pulmonary edema, systolic heart failure, depression, postoperative atrial fibrillation, hyperglycemia.   Clinical Impression   PTA, pt was living with his son and performed BADLs and used RW for mobility; unsure of reliability as pt presenting with decreased cognition. Reports son performs all IADLs. Pt currently requiring Scott A for UB ADLs, Total A for LB ADLs, and Scott-Total A +2 for functional transfers. Pt presenting with decreased balance, strength, functional use of LUE, cognition, and activity tolerance. Pt stating he can't get off bed and when asked why pt stating, "I am going to die now." Notified RN and recommended chaplain visit. Pt would benefit from further acute OT to facilitate safe dc. Recommend dc to SNF for further OT to optimize safety, independence with ADLs, and return to PLOF.     Follow Up Recommendations  SNF    Equipment Recommendations  Other (comment) (Defer to next venue)    Recommendations for Other Services PT consult;Speech consult     Precautions / Restrictions Precautions Precautions: Fall      Mobility Bed Mobility Overal bed mobility: Needs Assistance Bed Mobility: Supine to Sit     Supine to sit: Scott assist;+2 for physical assistance;HOB elevated     General bed mobility comments: Scott A +2 to bring BLEs towards EOB and then elevate trunk    Transfers Overall  transfer level: Needs assistance Equipment used: 2 person hand held assist Transfers: Sit to/from [employer] Sit to Stand: Scott assist;+2 physical assistance;+2 safety/equipment Stand pivot transfers: Total assist;+2 physical assistance       General transfer comment: Scott A +2 to power up into standing; pt with decreased participation. Use of bed pad to elevate hips. Use of drop arm recliner to perform stand pivot to recliner with Total A +2 and use of bed pad    Balance Overall balance assessment: Needs assistance Sitting-balance support: No upper extremity supported;Feet supported Sitting balance-Leahy Scale: Poor     Standing balance support: Bilateral upper extremity supported;During functional activity Standing balance-Leahy Scale: Poor Standing balance comment: Reliant on Scott A +2 for standing                           ADL either performed or assessed with clinical judgement   ADL Overall ADL's : Needs assistance/impaired Eating/Feeding: NPO       Upper Body Bathing: Maximal assistance;Sitting   Lower Body Bathing: Total assistance;Sit to/from stand   Upper Body Dressing : Maximal assistance;Sitting   Lower Body Dressing: Total assistance;Sit to/from stand   Toilet Transfer: Total assistance;+2 for physical assistance;+2 for safety/equipment;Stand-pivot           Functional mobility during ADLs: Total assistance;+2 for safety/equipment;+2 for physical assistance General ADL Comments: Pt presenting with poor balance, strength, cognition, and acitivty tolerance     Vision         Perception     Praxis      Pertinent Vitals/Pain  Pain Assessment: Faces Faces Pain Scale: Hurts even more Pain Location: Generalizedl; >BLEs Pain Descriptors / Indicators: Constant;Discomfort;Grimacing;Guarding Pain Intervention(s): Monitored during session;Limited activity within patient's tolerance;Repositioned     Hand Dominance Right    Extremity/Trunk Assessment Upper Extremity Assessment Upper Extremity Assessment: LUE deficits/detail LUE Deficits / Details: Decreased strength, grasp, and coorindation. Difficulty holding cup with L hand nad having to support with RUE.  LUE Coordination: decreased fine motor;decreased gross motor   Lower Extremity Assessment Lower Extremity Assessment: Defer to PT evaluation   Cervical / Trunk Assessment Cervical / Trunk Assessment: Kyphotic   Communication Communication Communication: No difficulties;Other (comment) (Slow to respond)   Cognition Arousal/Alertness: Lethargic Behavior During Therapy: Flat affect Overall Cognitive Status: Impaired/Different from baseline Area of Impairment: Attention;Memory;Following commands;Safety/judgement;Awareness;Problem solving                   Current Attention Level: Sustained Memory: Decreased short-term memory Following Commands: Follows one step commands inconsistently;Follows one step commands with increased time (Significant time) Safety/Judgement: Decreased awareness of safety;Decreased awareness of deficits Awareness: Intellectual Problem Solving: Slow processing;Requires verbal cues General Comments: Pt repeating throughout session "I am going to die." Poor volition to participate in OOB activity or therapy. Pt also with decreased attention, awareness, and problem solving   General Comments  VSS on RA. BP stable    Exercises     Shoulder Instructions      Home Living Family/patient expects to be discharged to:: Skilled nursing facility Living Arrangements: Children Available Help at Discharge: Family;Available 24 hours/day                                    Prior Functioning/Environment Level of Independence: Needs assistance  Gait / Transfers Assistance Needed: Pt reports he performs functional mobility with RW ADL's / Homemaking Assistance Needed: Reports he does ADLs and son performs IADLs             OT Problem List: Decreased strength;Decreased range of motion;Decreased activity tolerance;Impaired balance (sitting and/or standing);Decreased coordination;Decreased cognition;Decreased safety awareness;Decreased knowledge of use of DME or AE;Pain      OT Treatment/Interventions: Self-care/ADL training;Therapeutic exercise;Energy conservation;DME and/or AE instruction;Therapeutic activities;Patient/family education    OT Goals(Current goals can be found in the care plan section) Acute Rehab OT Goals Patient Stated Goal: "Lay here" OT Goal Formulation: With patient Time For Goal Achievement: 06/29/20 Potential to Achieve Goals: Good  OT Frequency: Min 2X/week   Barriers to D/C:            Co-evaluation              AM-PAC OT "6 Clicks" Daily Activity     Outcome Measure Help from another person eating meals?: Total Help from another person taking care of personal grooming?: A Lot Help from another person toileting, which includes using toliet, bedpan, or urinal?: A Lot Help from another person bathing (including washing, rinsing, drying)?: A Lot Help from another person to put on and taking off regular upper body clothing?: A Lot Help from another person to put on and taking off regular lower body clothing?: Total 6 Click Score: 10   End of Session Equipment Utilized During Treatment: Gait belt Nurse Communication: Mobility status;Other (comment);Need for lift equipment (Would benefit from chaplain as pt repeating "I am dying")  Activity Tolerance: Patient limited by fatigue;Patient limited by pain Patient left: in chair;with call bell/phone within reach;with chair alarm set  OT Visit Diagnosis: Unsteadiness on feet (R26.81);Other abnormalities of gait and mobility (R26.89);Muscle weakness (generalized) (M62.81)                Time: 2119-4174 OT Time Calculation (min): 29 min Charges:  OT General Charges $OT Visit: 1 Visit OT Evaluation $OT Eval Moderate  Complexity: 1 Mod  Lilie Vezina MSOT, OTR/L Acute Rehab Pager: [phone] Office: [phone]  Theodoro Grist Chala Gul 06/15/2020, 10:24 AM

## 2020-06-15 NOTE — Progress Notes (Signed)
Physical Therapy Treatment Patient Details Name: Scott Rios MRN: [id number] DOB: 1956-02-01 Today's Date: 06/15/2020    History of Present Illness 64 y.o. male presenting to ED with poor gait, poor oral intake, and confusion. CT head showing acute infarct in left periventricular area. EKG showing A. fib with RVR. chest x-ray with no acute finding. PMH including DM 2, HTN, ongoing heavy smoking, COPD, previous EtOH abuse, multivessel CAD, severe aortic stenosis with insufficiency, mitral valve insufficiency, s/p CABG x6, s/p aortic valve replacement, s/p mitral valve annuloplasty, pulmonary edema, systolic heart failure, depression, postoperative atrial fibrillation, hyperglycemia.    PT Comments    Pt is emotional and demonstrates little involvement in mobility today, states "I am going to die, you are killing me" at EOB and in recliner. PT and OT reassured and encouraged pt throughout mobility. Pt presently requiring max-total +2 for mobility tasks, and pt  contributes very little to no little physical assist during transfers. SNF remains appropriate d/c plan, will continue to follow acutely.     Follow Up Recommendations  SNF     Equipment Recommendations  None recommended by PT    Recommendations for Other Services       Precautions / Restrictions Precautions Precautions: Fall Restrictions Weight Bearing Restrictions: No    Mobility  Bed Mobility Overal bed mobility: Needs Assistance Bed Mobility: Supine to Sit     Supine to sit: Max assist;+2 for physical assistance;HOB elevated     General bed mobility comments: Max A +2 to bring BLEs towards EOB and then elevate trunk  Transfers Overall transfer level: Needs assistance Equipment used: 2 person hand held assist Transfers: Sit to/from [employer] Sit to Stand: Max assist;+2 physical assistance;+2 safety/equipment Stand pivot transfers: Total assist;+2 physical assistance       General transfer  comment: Max A +2 to power up into standing; pt with decreased participation. Use of bed pad to elevate hips. Use of drop arm recliner to perform stand pivot to recliner with Total A +2 and use of bed pad  Ambulation/Gait                 Stairs             Wheelchair Mobility    Modified Rankin (Stroke Patients Only)       Balance Overall balance assessment: Needs assistance Sitting-balance support: No upper extremity supported;Feet supported Sitting balance-Leahy Scale: Poor Sitting balance - Comments: requiring min-mod assist for posterior truncal assist Postural control: Posterior lean Standing balance support: Bilateral upper extremity supported;During functional activity Standing balance-Leahy Scale: Poor Standing balance comment: Reliant on Max A +2 for standing                            Cognition Arousal/Alertness: Lethargic Behavior During Therapy: Flat affect Overall Cognitive Status: Impaired/Different from baseline Area of Impairment: Attention;Memory;Following commands;Safety/judgement;Awareness;Problem solving                   Current Attention Level: Sustained Memory: Decreased short-term memory Following Commands: Follows one step commands inconsistently;Follows one step commands with increased time (Significant time) Safety/Judgement: Decreased awareness of safety;Decreased awareness of deficits Awareness: Intellectual Problem Solving: Slow processing;Requires verbal cues General Comments: Pt repeating throughout session "I am going to die." Poor volition to participate in OOB activity or therapy. Pt also with decreased attention, awareness, and problem solving      Exercises      General Comments  General comments (skin integrity, edema, etc.): VSS on RA. BP stable      Pertinent Vitals/Pain Pain Assessment: Faces Faces Pain Scale: Hurts even more Pain Location: Generalizedl; >BLEs Pain Descriptors / Indicators:  Constant;Discomfort;Grimacing;Guarding Pain Intervention(s): Limited activity within patient's tolerance;Monitored during session;Repositioned    Home Living Family/patient expects to be discharged to:: Skilled nursing facility Living Arrangements: Children Available Help at Discharge: Family;Available 24 hours/day                Prior Function Level of Independence: Needs assistance  Gait / Transfers Assistance Needed: Pt reports he performs functional mobility with RW ADL's / Homemaking Assistance Needed: Reports he does ADLs and son performs IADLs     PT Goals (current goals can now be found in the care plan section) Acute Rehab PT Goals Patient Stated Goal: "Lay here" PT Goal Formulation: With patient Time For Goal Achievement: 06/26/20 Potential to Achieve Goals: Fair Progress towards PT goals: Progressing toward goals    Frequency    Min 3X/week      PT Plan Current plan remains appropriate    Co-evaluation PT/OT/SLP Co-Evaluation/Treatment: Yes   PT goals addressed during session: Mobility/safety with mobility;Balance        AM-PAC PT "6 Clicks" Mobility   Outcome Measure  Help needed turning from your back to your side while in a flat bed without using bedrails?: A Lot Help needed moving from lying on your back to sitting on the side of a flat bed without using bedrails?: Total Help needed moving to and from a bed to a chair (including a wheelchair)?: Total Help needed standing up from a chair using your arms (e.g., wheelchair or bedside chair)?: Total Help needed to walk in hospital room?: Total Help needed climbing 3-5 steps with a railing? : Total 6 Click Score: 7    End of Session Equipment Utilized During Treatment: Gait belt Activity Tolerance: Patient limited by fatigue Patient left: with call bell/phone within reach;in chair;with chair alarm set Nurse Communication: Mobility status PT Visit Diagnosis: Unsteadiness on feet (R26.81);Other  abnormalities of gait and mobility (R26.89);Muscle weakness (generalized) (M62.81)     Time: 7371-0626 PT Time Calculation (min) (ACUTE ONLY): 32 min  Charges:  $Therapeutic Activity: 8-22 mins                     Rigdon Macomber E, PT Acute Rehabilitation Services Pager [phone]  Office [phone]     Jocelin Schuelke D Despina Hidden 06/15/2020, 10:36 AM

## 2020-06-15 NOTE — Progress Notes (Signed)
   06/15/20 1437  Clinical Encounter Type  Visited With Patient  Visit Type Initial;Social support  Referral From Nurse  Consult/Referral To Chaplain  Stress Factors  Patient Stress Factors Exhausted   Chaplain responded to page. Chaplain provided reflective listening. Pt appeared to be in pain and sleepy and requested privacy. Chaplain will make a note for possible f/u.  This note was prepared by Chaplain Resident, Tacy Learn, MDiv. For questions, please contact by phone at [phone].

## 2020-06-15 NOTE — Consult Note (Signed)
[facility]    Date of Admission:  06/03/2020      Total days of antibiotics 5  Vancomycin + Ceftriaxone               Reason for Consult: Enterococcal bacteremia     Referring Provider: Ignacia Felling  Primary Care Provider: Elfredia Nevins, MD   Assessment: Scott Rios is a 64 y.o. male with entoerococcus faecalis bacteremia and new acute strokes involving b/l hemispheres. 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 He has a TEE scheduled tomorrow AM to evaluate. I suspect he will not be a surgical candidate given overall poor health and patient's preference (he states throughout the interview with PT team that he wants to die).  EKG reviewed and he is in Atrial flutter as of 10/29.   With PCN allergy, low risk overall given > 10 years ago and non-severe. Will do oral challenge - see ID pharmacy note for further details. Switch to Ampicilin + CTX anticipated later today for preferred medical therapy.     Plan: 1. Repeat blood cultures in AM again  2. Follow TEE results  3. Would consult TCTS consult if patient/family desires aggressive care including surgery 4. Amoxicillin challenge then change vanc to Ampicillin 5. Continue ceftriaxone BID      Principal Problem:   Enterococcus Fecalis bacteremia Active Problems:   S/P mitral valve replacement   Status post aortic valve replacement with bioprosthetic valve   Acute CVA (cerebrovascular accident) / Septic Emboli Vs Afib Related CVAs   Sepsis due to Enterococcus-Enterococcus faecalis bacteremia and sepsis   Tobacco abuse   Mitral regurgitation   S/P CABG x 6   Malnutrition of moderate degree   CAD (coronary artery disease)   Atrial fibrillation (HCC)   Current use of long term anticoagulation   . atorvastatin  80 mg Oral Daily  .  insulin aspart  0-15 Units Subcutaneous Q4H  . insulin detemir  10 Units Subcutaneous Daily  . metoprolol tartrate  2.5 mg Intravenous Q8H  . multivitamin with minerals  1 tablet Oral Daily  . tamsulosin  0.4 mg Oral Daily  . thiamine injection  100 mg Intravenous Daily   Or  . thiamine  100 mg Oral Daily    HPI: Scott Rios is a 64 y.o. male admitted to [facility] with altered mental status. He lives with his son.   Past history significant for T2DM, HTN, ongoing heavy smoking / COPD, remote EtOH use, CABG x 6 with mitral annuloplasty repair and aortic valve bioprosthetic replacement, depression, systolic HF, AFib.   Unable to get history from patient due to current medical problems. Two days prior to admission his son noticed changes including inability to ambulate, unsteady gait, poor oral intake and incontinence of urine.  Head CT demonstrated acute infarction in left periventricular area, EKG with AFib RVR, subtherapeutic INR, leukocytosis and fever. CXR w/o any acute findings. MRI of the brain revealed numerous widely scattered acute infarctions to the cerebellum and b/l hemispheres c/w embolic disease from heart or ascending aorta. Blood cultures growing enterococcus faecalis, which has now been repeatedly isolated.   Unable to detail much with h/o childhood allergy to penicillin. Non-severe and mostly described GI intolerances. No challenges since per his limited recollection.    Review of Systems: Review of Systems  Unable to perform  ROS: Medical condition    Past Medical History:  Diagnosis Date  . Diabetes mellitus without complication (HCC)   . Hypertension     Social History   Tobacco Use  . Smoking status: Former Smoker    Packs/day: 1.00    Types: Cigarettes    Quit date: 04/03/2020    Years since quitting: 0.2  . Smokeless tobacco: Never Used  Vaping Use  . Vaping Use: Never used  Substance Use Topics  . Alcohol use: No  . Drug use: No     Family History  Problem Relation Age of Onset  . CAD Father        died of MI in 39's   Allergies  Allergen Reactions  . Tramadol Other (See Comments)    Itchiness, visual hallucination.  Marland Kitchen Penicillins      Unknown reaction was told it happened as a child    OBJECTIVE: Blood pressure 125/67, pulse 83, temperature 97.6 F (36.4 C), temperature source Axillary, resp. rate 20, height  (1.702 m), weight 57.8 kg, SpO2 98 %.  Physical Exam Constitutional:      Appearance: He is ill-appearing.  HENT:     Mouth/Throat:     Mouth: Mucous membranes are dry.  Eyes:     General: No scleral icterus. Cardiovascular:     Heart sounds: Murmur (systolic 2-3/6 & diastolic murmur 1-2/6heard. ) heard.   Pulmonary:     Effort: Pulmonary effort is normal.     Breath sounds: Normal breath sounds.  Abdominal:     General: Bowel sounds are normal. There is no distension.     Tenderness: There is no abdominal tenderness.  Musculoskeletal:        General: Normal range of motion.  Skin:    General: Skin is warm and dry.     Capillary Refill: Capillary refill takes less than 2 seconds.  Neurological:     Mental Status: He is alert.     Comments: Unclear as to situation or year.      Lab Results Lab Results  Component Value Date   WBC 20.8 (H) 06/14/2020   HGB 8.5 (L) 06/14/2020   HCT 26.2 (L) 06/14/2020   MCV 82.6 06/14/2020   PLT 238 06/14/2020    Lab Results  Component Value Date   CREATININE 0.36 (L) 06/14/2020   BUN 12 06/14/2020   NA 128 (L) 06/14/2020   K 3.1 (L) 06/14/2020   CL 94 (L) 06/14/2020   CO2 27 06/14/2020    Lab Results  Component Value Date   ALT 23 03-Jul-2020   AST 18 2020-07-03   ALKPHOS 139 (H) 2020/07/03   BILITOT 0.9 07/03/2020     Microbiology: Recent Results (from the past 240 hour(s))  Respiratory Panel by RT PCR (Flu A&B, Covid) - Nasopharyngeal Swab     Status: None   Collection Time: Jul 03, 2020  1:36 PM   Specimen: Nasopharyngeal  Swab  Result Value Ref Range Status   SARS Coronavirus 2 by RT PCR NEGATIVE NEGATIVE Final    Comment: (NOTE) SARS-CoV-2 target nucleic acids are NOT DETECTED.  The SARS-CoV-2 RNA is generally detectable in upper respiratoy specimens during the acute phase of infection. The lowest concentration of SARS-CoV-2 viral copies this assay can detect is 131 copies/mL. A negative result does not preclude SARS-Cov-2 infection and should not be used as the sole basis for treatment or other patient management decisions. A negative result may occur with  improper specimen collection/handling, submission of  specimen other than nasopharyngeal swab, presence of viral mutation(s) within the areas targeted by this assay, and inadequate number of viral copies (<131 copies/mL). A negative result must be combined with clinical observations, patient history, and epidemiological information. The expected result is Negative.  Fact Sheet for Patients:  https://www.moore.com/  Fact Sheet for Healthcare Providers:  https://www.young.biz/  This test is no t yet approved or cleared by the [place] FDA and  has been authorized for detection and/or diagnosis of SARS-CoV-2 by FDA under an Emergency Use Authorization (EUA). This EUA will remain  in effect (meaning this test can be used) for the duration of the COVID-19 declaration under Section 564(b)(1) of the Act, 21 U.S.C. section 360bbb-3(b)(1), unless the authorization is terminated or revoked sooner.     Influenza A by PCR NEGATIVE NEGATIVE Final   Influenza B by PCR NEGATIVE NEGATIVE Final    Comment: (NOTE) The Xpert Xpress SARS-CoV-2/FLU/RSV assay is intended as an aid in  the diagnosis of influenza from Nasopharyngeal swab specimens and  should not be used as a sole basis for treatment. Nasal washings and  aspirates are unacceptable for Xpert Xpress SARS-CoV-2/FLU/RSV  testing.  Fact Sheet for  Patients: https://www.moore.com/  Fact Sheet for Healthcare Providers: https://www.young.biz/  This test is not yet approved or cleared by the [place] FDA and  has been authorized for detection and/or diagnosis of SARS-CoV-2 by  FDA under an Emergency Use Authorization (EUA). This EUA will remain  in effect (meaning this test can be used) for the duration of the  Covid-19 declaration under Section 564(b)(1) of the Act, 21  U.S.C. section 360bbb-3(b)(1), unless the authorization is  terminated or revoked. Performed at [facility], [address], [place], [place] [address]   Culture, blood (routine x 2)     Status: Abnormal   Collection Time: 05/27/2020  2:22 PM   Specimen: BLOOD LEFT FOREARM  Result Value Ref Range Status   Specimen Description   Final    BLOOD LEFT FOREARM Performed at [facility], [address], [place], [place] [address]    Special Requests   Final    NONE Performed at [facility], [address], [place], [place] [address]    Culture  Setup Time   Final    IN BOTH AEROBIC AND ANAEROBIC BOTTLES GRAM POSITIVE COCCI Gram Stain Report Called to,Read Back By and Verified With: C TURNER,RN@[email or web address]  06/12/20 MKELLY    Culture ENTEROCOCCUS FAECALIS (A)  Final   Report Status 06/14/2020 FINAL  Final   Organism ID, Bacteria ENTEROCOCCUS FAECALIS  Final      Susceptibility   Enterococcus faecalis - MIC*    AMPICILLIN <=2 SENSITIVE Sensitive     VANCOMYCIN 1 SENSITIVE Sensitive     GENTAMICIN SYNERGY SENSITIVE Sensitive     * ENTEROCOCCUS FAECALIS  Blood Culture ID Panel (Reflexed)     Status: Abnormal   Collection Time: 05/24/2020  2:22 PM  Result Value Ref Range Status   Enterococcus faecalis DETECTED (A) NOT DETECTED Final    Comment: CRITICAL RESULT CALLED TO, READ BACK BY AND VERIFIED WITH: G. Coffee PharmD 8:20 06/12/20 ([email or web address])    Enterococcus Faecium NOT DETECTED NOT DETECTED Final   Listeria monocytogenes  NOT DETECTED NOT DETECTED Final   Staphylococcus species NOT DETECTED NOT DETECTED Final   Staphylococcus aureus (BCID) NOT DETECTED NOT DETECTED Final   Staphylococcus epidermidis NOT DETECTED NOT DETECTED Final   Staphylococcus lugdunensis NOT DETECTED NOT DETECTED Final   Streptococcus  species NOT DETECTED NOT DETECTED Final   Streptococcus agalactiae NOT DETECTED NOT DETECTED Final   Streptococcus pneumoniae NOT DETECTED NOT DETECTED Final   Streptococcus pyogenes NOT DETECTED NOT DETECTED Final   A.calcoaceticus-baumannii NOT DETECTED NOT DETECTED Final   Bacteroides fragilis NOT DETECTED NOT DETECTED Final   Enterobacterales NOT DETECTED NOT DETECTED Final   Enterobacter cloacae complex NOT DETECTED NOT DETECTED Final   Escherichia coli NOT DETECTED NOT DETECTED Final   Klebsiella aerogenes NOT DETECTED NOT DETECTED Final   Klebsiella oxytoca NOT DETECTED NOT DETECTED Final   Klebsiella pneumoniae NOT DETECTED NOT DETECTED Final   Proteus species NOT DETECTED NOT DETECTED Final   Salmonella species NOT DETECTED NOT DETECTED Final   Serratia marcescens NOT DETECTED NOT DETECTED Final   Haemophilus influenzae NOT DETECTED NOT DETECTED Final   Neisseria meningitidis NOT DETECTED NOT DETECTED Final   Pseudomonas aeruginosa NOT DETECTED NOT DETECTED Final   Stenotrophomonas maltophilia NOT DETECTED NOT DETECTED Final   Candida albicans NOT DETECTED NOT DETECTED Final   Candida auris NOT DETECTED NOT DETECTED Final   Candida glabrata NOT DETECTED NOT DETECTED Final   Candida krusei NOT DETECTED NOT DETECTED Final   Candida parapsilosis NOT DETECTED NOT DETECTED Final   Candida tropicalis NOT DETECTED NOT DETECTED Final   Cryptococcus neoformans/gattii NOT DETECTED NOT DETECTED Final   Vancomycin resistance NOT DETECTED NOT DETECTED Final    Comment: Performed at [facility], [address] [place], [place] [address]  Culture, blood (routine x 2)     Status: Abnormal    Collection Time: Jul 20, 2020  2:26 PM   Specimen: BLOOD LEFT HAND  Result Value Ref Range Status   Specimen Description   Final    BLOOD LEFT HAND Performed at [facility], [address] [place], [place] [address]    Special Requests   Final    NONE Performed at [facility], [address] [place], [place] [address]    Culture  Setup Time   Final    IN BOTH AEROBIC AND ANAEROBIC BOTTLES GRAM POSITIVE COCCI Gram Stain Report Called to,Read Back By and Verified With: C TURNER,RN@[email or web address]  06/12/20 [facility] Performed at [facility], [address] [place], [place] [address]    Culture (A)  Final    ENTEROCOCCUS FAECALIS SUSCEPTIBILITIES PERFORMED ON PREVIOUS CULTURE WITHIN THE LAST 5 DAYS. Performed at [facility], [address] [place], [place] [address]    Report Status 06/14/2020 FINAL  Final  Urine culture     Status: Abnormal   Collection Time: Jul 20, 2020  7:26 PM   Specimen: Urine, Clean Catch  Result Value Ref Range Status   Specimen Description   Final    URINE, CLEAN CATCH Performed at [facility], [address] [place], [place] [address]    Special Requests   Final    NONE Performed at [facility], [address] [place], [place] [address]    Culture >=100,000 COLONIES/mL ENTEROCOCCUS FAECALIS (A)  Final   Report Status 06/14/2020 FINAL  Final   Organism ID, Bacteria ENTEROCOCCUS FAECALIS (A)  Final      Susceptibility   Enterococcus faecalis - MIC*    AMPICILLIN <=2 SENSITIVE Sensitive     NITROFURANTOIN <=16 SENSITIVE Sensitive     VANCOMYCIN 1 SENSITIVE Sensitive     * >=100,000 COLONIES/mL ENTEROCOCCUS FAECALIS  Culture, blood (routine x 2)     Status: None (Preliminary result)   Collection Time: 06/13/20  2:52 PM  Specimen: BLOOD  Result Value Ref Range Status   Specimen Description   Final    BLOOD LEFT ANTECUBITAL Performed at [facility], [address], [place], [place] [address]    Special Requests   Final     BOTTLES DRAWN AEROBIC AND ANAEROBIC Blood Culture adequate volume Performed at [facility], [address] [place], [place] [address]    Culture   Final    NO GROWTH 2 DAYS Performed at [facility], [address] [place], [place] [address]    Report Status PENDING  Incomplete  Culture, blood (routine x 2)     Status: Abnormal (Preliminary result)   Collection Time: 06/13/20  2:52 PM   Specimen: BLOOD  Result Value Ref Range Status   Specimen Description   Final    BLOOD BLOOD RIGHT HAND Performed at [facility], [address] [place], [place] [address]    Special Requests   Final    BOTTLES DRAWN AEROBIC AND ANAEROBIC Blood Culture adequate volume Performed at [facility], [address], [place], [place] [address]    Culture  Setup Time   Final    GRAM POSITIVE COCCI ANAEROBIC BOTTLE ONLY Gram Stain Report Called to,Read Back By and Verified With: Renato Shin  06/14/2020 KAY Organism ID to follow CRITICAL RESULT CALLED TO, READ BACK BY AND VERIFIED WITH: PHRMD V BRYK  06/15/20 BY S GEZAHEGN Performed at [facility], [address] [place], [place] [address]    Culture ENTEROCOCCUS FAECALIS (A)  Final   Report Status PENDING  Incomplete  Blood Culture ID Panel (Reflexed)     Status: Abnormal   Collection Time: 06/13/20  2:52 PM  Result Value Ref Range Status   Enterococcus faecalis DETECTED (A) NOT DETECTED Final    Comment: CRITICAL RESULT CALLED TO, READ BACK BY AND VERIFIED WITH: PHRMD V BRYK  06/15/20 BY S GEZAHEGN    Enterococcus Faecium NOT DETECTED NOT DETECTED Final   Listeria monocytogenes NOT DETECTED NOT DETECTED Final   Staphylococcus species NOT DETECTED NOT DETECTED Final   Staphylococcus aureus (BCID) NOT DETECTED NOT DETECTED Final   Staphylococcus epidermidis NOT DETECTED NOT DETECTED Final   Staphylococcus lugdunensis NOT DETECTED NOT DETECTED  Final   Streptococcus species NOT DETECTED NOT DETECTED Final   Streptococcus agalactiae NOT DETECTED NOT DETECTED Final   Streptococcus pneumoniae NOT DETECTED NOT DETECTED Final   Streptococcus pyogenes NOT DETECTED NOT DETECTED Final   A.calcoaceticus-baumannii NOT DETECTED NOT DETECTED Final   Bacteroides fragilis NOT DETECTED NOT DETECTED Final   Enterobacterales NOT DETECTED NOT DETECTED Final   Enterobacter cloacae complex NOT DETECTED NOT DETECTED Final   Escherichia coli NOT DETECTED NOT DETECTED Final   Klebsiella aerogenes NOT DETECTED NOT DETECTED Final   Klebsiella oxytoca NOT DETECTED NOT DETECTED Final   Klebsiella pneumoniae NOT DETECTED NOT DETECTED Final   Proteus species NOT DETECTED NOT DETECTED Final   Salmonella species NOT DETECTED NOT DETECTED Final   Serratia marcescens NOT DETECTED NOT DETECTED Final   Haemophilus influenzae NOT DETECTED NOT DETECTED Final   Neisseria meningitidis NOT DETECTED NOT DETECTED Final   Pseudomonas aeruginosa NOT DETECTED NOT DETECTED Final   Stenotrophomonas maltophilia NOT DETECTED NOT DETECTED Final   Candida albicans NOT DETECTED NOT DETECTED Final   Candida auris NOT DETECTED NOT DETECTED Final   Candida glabrata NOT DETECTED NOT DETECTED Final   Candida krusei NOT DETECTED NOT  DETECTED Final   Candida parapsilosis NOT DETECTED NOT DETECTED Final   Candida tropicalis NOT DETECTED NOT DETECTED Final   Cryptococcus neoformans/gattii NOT DETECTED NOT DETECTED Final   Vancomycin resistance NOT DETECTED NOT DETECTED Final    Comment: Performed at [facility], [address], [place], [place] [address]     Rexene Alberts, MSN, NP-C [facility]  [email or web address] Pager: [phone] Office: [phone] [facility] Main Line: [phone]   06/15/2020 10:05 AM

## 2020-06-15 NOTE — Progress Notes (Addendum)
STROKE TEAM PROGRESS NOTE   INTERVAL HISTORY His RNs are at the bedside.  Pt lying in bed, having lunch to be fed. He is awake alert, orientated to place, age, self and year but wrong on the month. Follows all simple commands, still has left UE and right LE weakness. Had episode this am with LOC, not responsive to sternal rub or nailbed pinch. Repaid response called and he was back to baseline right away. EEG no seizure activity before, during and after the episode. He had similar episodes x 3 in [facility]. Concerning for pseudoseizure.   Vitals:   06/15/20 0531 06/15/20 0630 06/15/20 1035 06/15/20 1052  BP: 130/76 125/67 131/79 (!) 146/72  Pulse: 92 83 96 95  Resp: 20 20 20 19   Temp:      TempSrc:      SpO2: 98% 98% 96% 97%  Weight:      Height:       CBC:  Recent Labs  Lab 06/14/2020 1418 05/23/2020 1418 06/12/20 0608 06/14/20 0556  WBC 21.7*   < > 21.2* 20.8*  NEUTROABS 20.2*  --   --   --   HGB 10.5*   < > 9.6* 8.5*  HCT 32.4*   < > 29.1* 26.2*  MCV 82.9   < > 83.6 82.6  PLT 191   < > 175 238   < > = values in this interval not displayed.   Basic Metabolic Panel:  Recent Labs  Lab 05/31/2020 1418 06/08/2020 1418 06/12/20 0608 06/14/20 0556  NA 128*   < > 132* 128*  K 3.8   < > 3.3* 3.1*  CL 90*   < > 95* 94*  CO2 30   < > 30 27  GLUCOSE 425*   < > 96 104*  BUN 12   < > 13 12  CREATININE 0.51*   < > 0.46* 0.36*  CALCIUM 8.4*   < > 8.4* 7.9*  MG 1.8  --   --   --    < > = values in this interval not displayed.   Lipid Panel:  Recent Labs  Lab 06/12/20 0608  CHOL 62  TRIG 61  HDL 19*  CHOLHDL 3.3  VLDL 12  LDLCALC 31   HgbA1c:  Recent Labs  Lab 06/06/2020 1418  HGBA1C 11.7*   Urine Drug Screen:  Recent Labs  Lab 05/25/2020 1927  LABOPIA NONE DETECTED  COCAINSCRNUR NONE DETECTED  LABBENZ NONE DETECTED  AMPHETMU NONE DETECTED  THCU NONE DETECTED  LABBARB NONE DETECTED    Alcohol Level No results for input(s): ETH in the last 168 hours.  IMAGING past 24  hours No results found.  PHYSICAL EXAM  Temp:  [97.6 F (36.4 C)-100.6 F (38.1 C)] 97.9 F (36.6 C) (11/01 1135) Pulse Rate:  [83-99] 93 (11/01 1135) Resp:  [16-21] 21 (11/01 1135) BP: (108-146)/(64-79) 141/74 (11/01 1135) SpO2:  [95 %-98 %] 95 % (11/01 1135)  General - Well nourished, well developed, in no apparent distress.  Ophthalmologic - fundi not visualized due to noncooperation.  Cardiovascular - irregularly irregular heart rate and rhythm.  Neuro - awake alert, sitting in bed, being fed for lunch. Eyes open, orientated to place, age, year but said "October". Mild psychomotor slowing. Following all simple commands, able to name 3/3 and able to repeat. Visual field full, gaze bilaterally without problem, tracking both sides. No facial droop, tongue midline. RUE at least 4/5, but LUE 3+/5 proximal, and 3/5 bicep and tricep, finger  grip 1-2/5. LLE proximal at least 2/5 and ankle PF/DF 3/5. However, RLE proximal 0/5, ankle PF/DF 1/5. However, with pain, RLE was able to have ankle PF. Sensation decreased on the LUE and diminished on the RLE. FTN bilateral intact although slow on the left. Gait not tested.    ASSESSMENT/PLAN Scott Rios is a 64 y.o. male with history of  DM2, HTN, smoker, EtOH use, CAD, severe aortic stenosis and insufficiency s/p CABG and aortic valve replacement and mitral valve annuloplasty, acute systolic heart failure, depression, post op recurrent afib on warfarin for at least 3 mos presenting to [facility] with acute encephalopathy w/ enterrococcus bacteremia/sepsis and embolic appearing strokes.   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 Endocarditis in setting of bacteremia vs AF on coumadin  CT head 10/28 new R periventricular hypodensity  MRI  Numerous scattered cerebellar and B cerebral infarcts (largest R posterior frontal)  MRA  R M1 30% narrowing   Repeat CT head 10/30 multifocal  hypoattenuation c/w recent infarcts seen on MRI  Carotid Doppler  B ICA < 50% atherosclerosis   2D Echo EF 55-60%. No source of embolus. LA severely dilated. MV annuloplasty ring in place. AV valve replacement/repair w/ severe calcification and thickening. No AS.  TEE 06/19/2020 at 0730a to look for embolic source of stroke  LDL 31  HgbA1c 11.7  VTE prophylaxis - IV heparin  warfarin daily prior to admission, now on heparin IV. If negative for endocarditis, will resume warfarin.   Therapy recommendations:  SNF  Disposition:  pending   Recurrent Unresponsive Episodes, ? pseudoseizures  3 episodes in [facility], 1 episode [facility] 06/15/20  Episodes quick on and off  Loaded w/ Keppra  EEG no sz  Episode during EEG not associated w/ any EEG change or sz  Continue keppra for now  Post op atrial Fibrillation  Home anticoagulation:  warfarin daily   INR 1.9 on admission  EKG during admission showed afib  On IV heparin   Sepsis secondary to Enterococcus faecalis bacteremia from UTI  WBC 20.8  TMax 100.5->100.6  Vanc 10/29>>11/1  Ceftriaxone 10/29>>  Ampicillin 11/1>>  TEE to look for endocarditis  Bcx 10/28 & 10/30 +enterococcus faecalis    Hypertension  Stable . Long-term BP goal normotensive  Hyperlipidemia  Home meds:  Atorvastatin 80, resumed in hospital  LDL 31, goal < 70  Continue statin at discharge  Diabetes type II Uncontrolled  HgbA1c 11.7, goal < 7.0  CBGs  SSI  No significant hyperglycemia  PCP close follow up  Other Stroke Risk Factors  Advanced Age >/= 66   Former smoker, quit in Aug 2021  Coronary artery disease s/p CABG x6, mitral valve repair, aortic valve replacement 03/2020  Chronic diastolic Congestive heart failure  Other Active Problems  Hyponatremia/hypokalemia   Hospital day # 4  Marvel Plan, MD PhD Stroke Neurology 06/15/2020 5:10 PM  Patient again had episode of LOC. I discussed with Dr. Nelson Chimes and Dr. Melynda Ripple. I  spent  35 minutes in total face-to-face time with the patient, more than 50% of which was spent in counseling and coordination of care, reviewing test results, images and medication, and discussing the diagnosis, treatment plan and potential prognosis. This patient's care requiresreview of multiple databases, neurological assessment, discussion with family, other specialists and medical decision making of high complexity.  To contact Stroke Continuity provider, please refer to [email or web address]. After hours, contact General Neurology

## 2020-06-15 NOTE — Procedures (Signed)
Patient Name: Scott Rios  MRN: [id number]  Epilepsy Attending: Charlsie Quest  Referring Physician/Provider: Dr Marvel Plan Date: 06/15/2020 Duration: 25.50 mins  Patient history: 64 year old male presented with acute encephalopathy.  EEG to evaluate for seizures.  Level of alertness: Awake, asleep  AEDs during EEG study: LEV  Technical aspects: This EEG study was done with scalp electrodes positioned according to the 10-20 International system of electrode placement. Electrical activity was acquired at a sampling rate of 500Hz  and reviewed with a high frequency filter of 70Hz  and a low frequency filter of 1Hz . EEG data were recorded continuously and digitally stored.   Description: The posterior dominant rhythm consists of  8-9 Hz activity of moderate voltage (25-35 uV) seen predominantly in posterior head regions, symmetric and reactive to eye opening and eye closing. Sleep was characterized by vertex waves, sleep spindles (12 to 14 Hz), maximal frontocentral region.  EEG showed intermittent 2-3 Hz delta slowing in left centro-temporal region. Physiologic photic driving was not seen during photic stimulation.  Hyperventilation was not performed.     ABNORMALITY -Intermittent slow, left centro-temporal region  IMPRESSION: This study is suggestive of non specific cortical dysfunction in left centro-temporal region. No seizures or epileptiform discharges were seen throughout the recording.  Pilot Prindle 

## 2020-06-15 NOTE — Progress Notes (Signed)
ANTICOAGULATION CONSULT NOTE -   Pharmacy Consult for Heparin Indication: atrial fibrillation/valve replacements  Patient Measurements: Height: 5\' 7"  (170.2 cm) Weight: 57.8 kg (127 lb 6.8 oz) IBW/kg (Calculated) : 66.1 Heparin Dosing Weight: HEPARIN DW (KG): 57.8  Vital Signs: Temp: 97.6 F (36.4 C) (11/01 0330) Temp Source: Axillary (11/01 0330) BP: 125/67 (11/01 0630) Pulse Rate: 83 (11/01 0630)  Labs: Recent Labs    06/13/20 0556 06/14/20 0556 06/14/20 2150  HGB  --  8.5*  --   HCT  --  26.2*  --   PLT  --  238  --   LABPROT 30.7* 16.9*  --   INR 3.1* 1.4*  --   HEPARINUNFRC  --   --  <0.10*  CREATININE  --  0.36*  --     Estimated Creatinine Clearance: 75.3 mL/min (A) (by C-G formula based on SCr of 0.36 mg/dL (L)).   Assessment: 64 YOM on warfarin PTA for hx Afib/AVR transitioned to Heparin while awaiting further work-up of strokes and bacteremia. Pharmacy consulted to dose.   Heparin level this morning remains SUBtherapeutic despite a rate increase earlier today (HL <0.1, goal of 0.3-0.5). 10/31: Hgb 8.5 - no bleeding noted at this time.   Goal of Therapy:  HL 0.3-0.5 Monitor platelets by anticoagulation protocol: Yes   Plan:  - Increase Heparin to 1250 units/hr (12.5 ml/hr) - Will continue to monitor for any signs/symptoms of bleeding and will follow up with heparin level in 6 hours   Thank you for allowing pharmacy to be a part of this patient's care.  11/31, PharmD, BCPS Clinical Pharmacist Clinical phone for 06/15/2020: [phone] 06/15/2020 8:45 AM   **Pharmacist phone directory can now be found on [email or web address] ([email or web address]).  Listed under [facility].

## 2020-06-15 NOTE — Progress Notes (Signed)
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 The first line treatment for this infection includes ampicillin, a penicillin antibiotic. Unfortunately he has an unspecified allergy to pencillin listed in his chart with no history of any penicillin antibiotic administrations. When asked if he remembers having a reaction to penicillin he says he thinks it was back when he was a teenager. He remembers throwing up being the main complaint. He does say he went to the hospital, but it was difficult to get anymore information out of him.   There is low suspicion that Scott Rios is still penicillin allergic given the history and timing of his penicillin allergy. We will challenge him with a dose of amoxicillin in hopes of being able to change his antibiotic regimen. I spoke with his nurse who will monitor him every 15 minutes for the hour after administration. If he is able to tolerate we will, remove his allergy.    Plan:  -Amoxicillin 500mg  x1  -Monitor every 15 minutes for the hour after administration -Remove allergy if tolerated -Change vancomycin to ampicillin/ceftriaxone if not allergic  , PharmD Infectious Disease Pharmacist  Phone: [phone]

## 2020-06-15 NOTE — Progress Notes (Signed)
PHARMACY - PHYSICIAN COMMUNICATION CRITICAL VALUE ALERT - BLOOD CULTURE IDENTIFICATION (BCID)  Scott Rios is an 64 y.o. male who presented to [facility] on 29-Jun-2020 with a chief complaint of AMS.   Assessment:  Admitted for CVA, found to have bacteremia, now tx'd to [facility], subsequent blood cx still growing E.faecalis.  Name of physician (or Provider) Contacted: BChotiner  Current antibiotics: vancomycin  Changes to prescribed antibiotics recommended:  Patient is on recommended antibiotics - No changes needed  Results for orders placed or performed during the hospital encounter of June 29, 2020  Blood Culture ID Panel (Reflexed) (Collected: 06/13/2020  2:52 PM)  Result Value Ref Range   Enterococcus faecalis DETECTED (A) NOT DETECTED   Enterococcus Faecium NOT DETECTED NOT DETECTED   Listeria monocytogenes NOT DETECTED NOT DETECTED   Staphylococcus species NOT DETECTED NOT DETECTED   Staphylococcus aureus (BCID) NOT DETECTED NOT DETECTED   Staphylococcus epidermidis NOT DETECTED NOT DETECTED   Staphylococcus lugdunensis NOT DETECTED NOT DETECTED   Streptococcus species NOT DETECTED NOT DETECTED   Streptococcus agalactiae NOT DETECTED NOT DETECTED   Streptococcus pneumoniae NOT DETECTED NOT DETECTED   Streptococcus pyogenes NOT DETECTED NOT DETECTED   A.calcoaceticus-baumannii NOT DETECTED NOT DETECTED   Bacteroides fragilis NOT DETECTED NOT DETECTED   Enterobacterales NOT DETECTED NOT DETECTED   Enterobacter cloacae complex NOT DETECTED NOT DETECTED   Escherichia coli NOT DETECTED NOT DETECTED   Klebsiella aerogenes NOT DETECTED NOT DETECTED   Klebsiella oxytoca NOT DETECTED NOT DETECTED   Klebsiella pneumoniae NOT DETECTED NOT DETECTED   Proteus species NOT DETECTED NOT DETECTED   Salmonella species NOT DETECTED NOT DETECTED   Serratia marcescens NOT DETECTED NOT DETECTED   Haemophilus influenzae NOT DETECTED NOT DETECTED   Neisseria meningitidis NOT DETECTED NOT DETECTED    Pseudomonas aeruginosa NOT DETECTED NOT DETECTED   Stenotrophomonas maltophilia NOT DETECTED NOT DETECTED   Candida albicans NOT DETECTED NOT DETECTED   Candida auris NOT DETECTED NOT DETECTED   Candida glabrata NOT DETECTED NOT DETECTED   Candida krusei NOT DETECTED NOT DETECTED   Candida parapsilosis NOT DETECTED NOT DETECTED   Candida tropicalis NOT DETECTED NOT DETECTED   Cryptococcus neoformans/gattii NOT DETECTED NOT DETECTED   Vancomycin resistance NOT DETECTED NOT DETECTED    Vernard Gambles, PharmD, BCPS  06/15/2020  1:18 AM

## 2020-06-15 NOTE — Plan of Care (Signed)
One episode of transient alteration of awareness was noted on 06/15/2020 at 10:30 AM.  Concomitant EEG before, during and after the event did not show any EEG change suggestive of seizure. Dr Roda Shutters was notified.  Scott Rios Annabelle Harman

## 2020-06-15 DEATH — deceased

## 2020-06-16 ENCOUNTER — Inpatient Hospital Stay (HOSPITAL_COMMUNITY): Payer: Medicare Other

## 2020-06-16 ENCOUNTER — Encounter (HOSPITAL_COMMUNITY): Payer: Self-pay | Admitting: Anesthesiology

## 2020-06-16 ENCOUNTER — Encounter (HOSPITAL_COMMUNITY): Payer: Self-pay | Admitting: Internal Medicine

## 2020-06-16 ENCOUNTER — Encounter (HOSPITAL_COMMUNITY): Admission: EM | Disposition: E | Payer: Self-pay | Source: Home / Self Care | Attending: Internal Medicine

## 2020-06-16 DIAGNOSIS — B952 Enterococcus as the cause of diseases classified elsewhere: Secondary | ICD-10-CM

## 2020-06-16 DIAGNOSIS — R7881 Bacteremia: Secondary | ICD-10-CM

## 2020-06-16 LAB — CBC
HCT: 27.2 % — ABNORMAL LOW (ref 39.0–52.0)
Hemoglobin: 8.8 g/dL — ABNORMAL LOW (ref 13.0–17.0)
MCH: 26.3 pg (ref 26.0–34.0)
MCHC: 32.4 g/dL (ref 30.0–36.0)
MCV: 81.4 fL (ref 80.0–100.0)
Platelets: 309 10*3/uL (ref 150–400)
RBC: 3.34 MIL/uL — ABNORMAL LOW (ref 4.22–5.81)
RDW: 15.9 % — ABNORMAL HIGH (ref 11.5–15.5)
WBC: 24.2 10*3/uL — ABNORMAL HIGH (ref 4.0–10.5)
nRBC: 0 % (ref 0.0–0.2)

## 2020-06-16 LAB — GLUCOSE, CAPILLARY
Glucose-Capillary: 138 mg/dL — ABNORMAL HIGH (ref 70–99)
Glucose-Capillary: 249 mg/dL — ABNORMAL HIGH (ref 70–99)
Glucose-Capillary: 83 mg/dL (ref 70–99)
Glucose-Capillary: 91 mg/dL (ref 70–99)
Glucose-Capillary: 98 mg/dL (ref 70–99)

## 2020-06-16 LAB — CULTURE, BLOOD (ROUTINE X 2): Special Requests: ADEQUATE

## 2020-06-16 LAB — HEPARIN LEVEL (UNFRACTIONATED)
Heparin Unfractionated: 0.16 IU/mL — ABNORMAL LOW (ref 0.30–0.70)
Heparin Unfractionated: 0.34 IU/mL (ref 0.30–0.70)
Heparin Unfractionated: 0.39 IU/mL (ref 0.30–0.70)

## 2020-06-16 LAB — PROTIME-INR
INR: 1.3 — ABNORMAL HIGH (ref 0.8–1.2)
Prothrombin Time: 15.3 seconds — ABNORMAL HIGH (ref 11.4–15.2)

## 2020-06-16 LAB — MAGNESIUM: Magnesium: 1.8 mg/dL (ref 1.7–2.4)

## 2020-06-16 SURGERY — CANCELLED PROCEDURE

## 2020-06-16 MED ORDER — SODIUM CHLORIDE 0.9 % IV SOLN
2.0000 g | Freq: Four times a day (QID) | INTRAVENOUS | Status: DC
  Administered 2020-06-16 (×2): 2 g via INTRAVENOUS
  Filled 2020-06-16: qty 2000
  Filled 2020-06-16: qty 2
  Filled 2020-06-16 (×2): qty 2000
  Filled 2020-06-16: qty 2
  Filled 2020-06-16: qty 2000

## 2020-06-18 LAB — CULTURE, BLOOD (ROUTINE X 2)
Culture: NO GROWTH
Special Requests: ADEQUATE

## 2020-06-19 SURGERY — ECHOCARDIOGRAM, TRANSESOPHAGEAL
Anesthesia: Monitor Anesthesia Care

## 2020-06-21 LAB — CULTURE, BLOOD (ROUTINE X 2)
Culture: NO GROWTH
Culture: NO GROWTH
Special Requests: ADEQUATE
Special Requests: ADEQUATE

## 2020-06-22 LAB — VITAMIN B1: Vitamin B1 (Thiamine): 118.6 nmol/L (ref 66.5–200.0)

## 2020-07-15 NOTE — Procedures (Signed)
Patient Name:Scott Rios DJT:[id number] Epilepsy Attending:Clotine Heiner Annabelle Harman Referring Physician/Provider:Dr Marvel Plan Duration: 07-10-20 1000 to July 10, 2020 1351  Patient history:64 year old male presented with acute encephalopathy. EEG to evaluate for seizures.  Level of alertness:Awake, asleep  AEDs during EEG study:LEV  Technical aspects: This EEG study was done with scalp electrodes positioned according to the 10-20 International system of electrode placement. Electrical activity was acquired at a sampling rate of 500Hz  and reviewed with a high frequency filter of 70Hz  and a low frequency filter of 1Hz . EEG data were recorded continuously and digitally stored.   Description: The posterior dominant rhythm consists of8-9Hz  activity of moderate voltage (25-35 uV) seen predominantly in posterior head regions, symmetric and reactive to eye opening and eye closing. Sleep was characterized by vertex waves, sleep spindles (12 to 14 Hz), maximal frontocentral region. EEG showed intermittent 2-3Hz  delta slowing in left centro-temporal region.  ABNORMALITY -Intermittent slow, generalized, maximalleft centro-temporal region  IMPRESSION: This study issuggestive of non specific cortical dysfunction in left centro-temporal region.  Additionally there is evidence of mild encephalopathy, nonspecific etiology.No seizures or epileptiform discharges were seen throughout the recording.  [name] 

## 2020-07-15 NOTE — Procedures (Addendum)
Patient Name: Scott Rios  MRN: [id number]  Epilepsy Attending: Charlsie Quest  Referring Physician/Provider: Dr Marvel Plan Duration: 06/15/2020 4098 to 07/06/20 1191, [id number]  Patient history: 64 year old male presented with acute encephalopathy.  EEG to evaluate for seizures.  Level of alertness: Awake, asleep  AEDs during EEG study: LEV  Technical aspects: This EEG study was done with scalp electrodes positioned according to the 10-20 International system of electrode placement. Electrical activity was acquired at a sampling rate of 500Hz  and reviewed with a high frequency filter of 70Hz  and a low frequency filter of 1Hz . EEG data were recorded continuously and digitally stored.   Description: The posterior dominant rhythm consists of  8-9 Hz activity of moderate voltage (25-35 uV) seen predominantly in posterior head regions, symmetric and reactive to eye opening and eye closing. Sleep was characterized by vertex waves, sleep spindles (12 to 14 Hz), maximal frontocentral region.  EEG showed intermittent 2-3 Hz delta slowing in left centro-temporal region.      One event was recorded on 06/15/2020 at 1030 during which patient was noted to be less responsive, not waking up to sternal rub.  Concomitant EEG before, during and after the event did not show any EEG change to suggest seizure.  ABNORMALITY -Intermittent slow, generalized, maximal left centro-temporal region  IMPRESSION: This study is suggestive of non specific cortical dysfunction in left centro-temporal region.  Additionally there is evidence of mild encephalopathy, nonspecific etiology. No seizures or epileptiform discharges were seen throughout the recording.  One event was recorded on 06/15/1020 at 10:30 AM during which patient was noted to be less responsive without concomitant EEG change and was not an epileptic event.  Ahmya Bernick 

## 2020-07-15 NOTE — Progress Notes (Signed)
1845 telemetry called and reported that patient was desating and his respiration rate was in the 40's.  RN went in the room and patient was pale looking, not responding.  RRT notified,  Carotid pulse  Vaguely palpable. Patient passed away at 1855. RN called the son, Scott Rios and left a message to call back the floor and spoke with sister, Scott Rios.   [place] [facility] notified of patient's death.  Patient is not suitable for organ donation per [place] [facility].

## 2020-07-15 NOTE — Progress Notes (Signed)
LTM maintenance completed; no skin breakdown under F7, F8, Fp1, or A1.

## 2020-07-15 NOTE — Progress Notes (Addendum)
PROGRESS NOTE    Scott Rios  VEL:[id number] DOB: March 04, 1956 DOA: 06-28-2020 PCP: Elfredia Nevins, MD   Brief Narrative:  64 y.o. male with PMH significant for DM2, HTN, smoker, EtOH use, CAD, severe aortic stenosis and insufficiency s/p CABG and aortic valve replacement and mitral valve annuloplasty, acute systolic heart failure, depression, post op recurrent afibb and was placed on warfarin for atleast 3 months who is admitted to [facility] with acute encephalopathy. Found to have enterococcus bacteremia/sepsis and embolic appearing strokes.  Patient transferred to [facility] for further care.  Patient requires TEE, cardiology team aware.   Assessment & Plan:   Principal Problem:   Enterococcus Fecalis bacteremia Active Problems:   Tobacco abuse   Mitral regurgitation   S/P CABG x 6   Malnutrition of moderate degree   CAD (coronary artery disease)   S/P mitral valve replacement   Atrial fibrillation (HCC)   Status post aortic valve replacement with bioprosthetic valve   Current use of long term anticoagulation   Acute CVA (cerebrovascular accident) / Septic Emboli Vs Afib Related CVAs   Sepsis due to Enterococcus-Enterococcus faecalis bacteremia and sepsis  Multifocal bilateral acute embolic CVA, largest in right posterior frontal cortex with unresponsive episodes -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  Aortic valve replaced/repaired -MRA head and neck negative for large vessel occlusion, 30% right M1 narrowing -Neurology following -Unable to perform TEE this morning due to issues with consenting.  Have notified cardiology team to call sister and go and get the consent.  Next earliest time for TEE is Friday but I requested if it can be done sooner. -EEG-generalized  slowing -Keppra 500 mg twice daily IV -On heparin drip  Sepsis secondary to Enterococcus faecalis bacteremia CABG X6, mitral valve repair, aortic valve replacement August 2021 -Antibiotics-ampicillin -ID following -If TEE positive, CT surgery will need to be involved  -Monitor surveillance cultures from 11/1.  Will not be able to get PICC line until bacteremia has cleared  Diabetes mellitus type 2, uncontrolled and labile -Insulin sliding scale Accu-Chek.  Continue Levemir 10 units daily.   Extensively spoken with ethics committee [facility]) regarding obtaining consent.  Not able to get in touch with patient's son therefore spoke with patient's Sister Amada Jupiter who understands the necessity of this procedure and is okay with consenting for the procedure.  Ethics team agreed that if he not able to get in touch with the sons, obtaining consent from sister would be reasonable.  I will notify the cardiology team to go ahead and obtain consent from the sister in the meantime we will be available in case if the sons are able to get in touch with [place] again.    DVT prophylaxis: Heparin drip Code Status: DNR Family Communication: Patient's daughter Dondra Spry updated by me  Status is: Inpatient  Remains inpatient appropriate because:Inpatient level of care appropriate due to severity of illness   Dispo: The patient is from: Home              Anticipated d/c is to: SNF              Anticipated d/c date is: 3 days              Patient currently is not medically stable to d/c. On going eval for endocarditis.        Body mass index  is 19.96 kg/m.  Pressure Injury 04/12/20 Buttocks Right Stage 2 -  Partial thickness loss of dermis presenting as a shallow open injury with a red, pink wound bed without slough. (Active)  04/12/20 0800  Location: Buttocks  Location Orientation: Right  Staging: Stage 2 -  Partial thickness loss of dermis presenting as a shallow open injury with a red, pink wound bed  without slough.  Wound Description (Comments):   Present on Admission:      Pressure Injury 06/13/20 Coccyx Mid Stage 2 -  Partial thickness loss of dermis presenting as a shallow open injury with a red, pink wound bed without slough. small open area (Active)  06/13/20 0800  Location: Coccyx  Location Orientation: Mid  Staging: Stage 2 -  Partial thickness loss of dermis presenting as a shallow open injury with a red, pink wound bed without slough.  Wound Description (Comments): small open area  Present on Admission:       Subjective: Patient went for TEE this morning but due to concerning issue it was not performed and sent back to his room.  I have spoken with patient's sister who is consenting for it.  I am unable to get in touch with either of the sons and neither is his sister.  I also notified cardiology team to obtain their consent for the procedure. No other acute events overnight.  Examination:  Constitutional: Not in acute distress Respiratory: Clear to auscultation bilaterally Cardiovascular: Normal sinus rhythm, no rubs Abdomen: Nontender nondistended good bowel sounds Musculoskeletal: No edema noted Skin: No rashes seen Neurologic: Grossly moving all the extremities Psychiatric: Very poor judgment and insight  Objective: Vitals:   07/09/2020 0607 07/01/2020 0714 06/30/2020 0823 07/01/2020 0827  BP: 123/65 (!) 116/58 113/65   Pulse: 87 92 92   Resp: 19 (!) Temp: 97.9 F (36.6 C) 97.8 F (36.6 C) 98 F (36.7 C)   TempSrc: Oral Oral Oral   SpO2: 95% 94% 94%   Weight:  57.8 kg    Height:   (1.702 m)      Intake/Output Summary (Last 24 hours) at 06/29/2020 1057 Last data filed at 06/25/2020 0640 Gross per 24 hour  Intake 1357.18 ml  Output 1400 ml  Net -42.82 ml   Filed Weights   06-21-2020 1305 06/12/20 0914 07/06/2020 0714  Weight: 59 kg 57.8 kg 57.8 kg     Data Reviewed:   CBC: Recent Labs  Lab 06-21-2020 1418 06/12/20 0608 06/14/20 0556  07/01/2020 0406  WBC 21.7* 21.2* 20.8* 24.2*  NEUTROABS 20.2*  --   --   --   HGB 10.5* 9.6* 8.5* 8.8*  HCT 32.4* 29.1* 26.2* 27.2*  MCV 82.9 83.6 82.6 81.4  PLT 191 175 238 309   Basic Metabolic Panel: Recent Labs  Lab 21-Jun-2020 1418 06/12/20 0608 06/14/20 0556 07/06/2020 0406  NA 128* 132* 128*  --   K 3.8 3.3* 3.1*  --   CL 90* 95* 94*  --   CO2 --   GLUCOSE 425* 96 104*  --   BUN --   CREATININE 0.51* 0.46* 0.36*  --   CALCIUM 8.4* 8.4* 7.9*  --   MG 1.8  --   --  1.8   GFR: Estimated Creatinine Clearance: 75.3 mL/min (A) (by C-G formula based on SCr of 0.36 mg/dL (L)). Liver Function Tests: Recent Labs  Lab 06-21-2020 1418  AST 18  ALT 23  ALKPHOS 139*  BILITOT 0.9  PROT 6.0*  ALBUMIN 2.4*   No results for input(s): LIPASE, AMYLASE in the last 168 hours. No results for input(s): AMMONIA in the last 168 hours. Coagulation Profile: Recent Labs  Lab 06/12/20 0608 06/13/20 0556 06/14/20 0556 06/15/20 0913 2020/06/25 0406  INR 2.0* 3.1* 1.4* 1.3* 1.3*   Cardiac Enzymes: No results for input(s): CKTOTAL, CKMB, CKMBINDEX, TROPONINI in the last 168 hours. BNP (last 3 results) No results for input(s): PROBNP in the last 8760 hours. HbA1C: No results for input(s): HGBA1C in the last 72 hours. CBG: Recent Labs  Lab 06/15/20 1604 06/15/20 1928 06/25/20 0006 2020-06-25 0319 06-25-2020 0849  GLUCAP 240* 373* 249* 138* 91   Lipid Profile: No results for input(s): CHOL, HDL, LDLCALC, TRIG, CHOLHDL, LDLDIRECT in the last 72 hours. Thyroid Function Tests: No results for input(s): TSH, T4TOTAL, FREET4, T3FREE, THYROIDAB in the last 72 hours. Anemia Panel: No results for input(s): VITAMINB12, FOLATE, FERRITIN, TIBC, IRON, RETICCTPCT in the last 72 hours. Sepsis Labs: Recent Labs  Lab 06/05/2020 1418 06/04/2020 1556  LATICACIDVEN 1.6 1.5    Recent Results (from the past 240 hour(s))  Respiratory Panel by RT PCR (Flu A&B, Covid) - Nasopharyngeal  Swab     Status: None   Collection Time: 06/05/2020  1:36 PM   Specimen: Nasopharyngeal Swab  Result Value Ref Range Status   SARS Coronavirus 2 by RT PCR NEGATIVE NEGATIVE Final    Comment: (NOTE) SARS-CoV-2 target nucleic acids are NOT DETECTED.  The SARS-CoV-2 RNA is generally detectable in upper respiratoy specimens during the acute phase of infection. The lowest concentration of SARS-CoV-2 viral copies this assay can detect is 131 copies/mL. A negative result does not preclude SARS-Cov-2 infection and should not be used as the sole basis for treatment or other patient management decisions. A negative result may occur with  improper specimen collection/handling, submission of specimen other than nasopharyngeal swab, presence of viral mutation(s) within the areas targeted by this assay, and inadequate number of viral copies (<131 copies/mL). A negative result must be combined with clinical observations, patient history, and epidemiological information. The expected result is Negative.  Fact Sheet for Patients:  https://www.moore.com/  Fact Sheet for Healthcare Providers:  https://www.young.biz/  This test is no t yet approved or cleared by the [place] FDA and  has been authorized for detection and/or diagnosis of SARS-CoV-2 by FDA under an Emergency Use Authorization (EUA). This EUA will remain  in effect (meaning this test can be used) for the duration of the COVID-19 declaration under Section 564(b)(1) of the Act, 21 U.S.C. section 360bbb-3(b)(1), unless the authorization is terminated or revoked sooner.     Influenza A by PCR NEGATIVE NEGATIVE Final   Influenza B by PCR NEGATIVE NEGATIVE Final    Comment: (NOTE) The Xpert Xpress SARS-CoV-2/FLU/RSV assay is intended as an aid in  the diagnosis of influenza from Nasopharyngeal swab specimens and  should not be used as a sole basis for treatment. Nasal washings and  aspirates are  unacceptable for Xpert Xpress SARS-CoV-2/FLU/RSV  testing.  Fact Sheet for Patients: https://www.moore.com/  Fact Sheet for Healthcare Providers: https://www.young.biz/  This test is not yet approved or cleared by the [place] FDA and  has been authorized for detection and/or diagnosis of SARS-CoV-2 by  FDA under an Emergency Use Authorization (EUA). This EUA will remain  in effect (meaning this test can be used) for the duration of the  Covid-19 declaration under Section 564(b)(1) of the  Act, 21  U.S.C. section 360bbb-3(b)(1), unless the authorization is  terminated or revoked. Performed at [facility], [address] [place], [place] [address]   Culture, blood (routine x 2)     Status: Abnormal   Collection Time: 06/09/2020  2:22 PM   Specimen: BLOOD LEFT FOREARM  Result Value Ref Range Status   Specimen Description   Final    BLOOD LEFT FOREARM Performed at [facility], [address] [place], [place] [address]    Special Requests   Final    NONE Performed at [facility], [address] [place], [place] [address]    Culture  Setup Time   Final    IN BOTH AEROBIC AND ANAEROBIC BOTTLES GRAM POSITIVE COCCI Gram Stain Report Called to,Read Back By and Verified With: C TURNER,RN@0436  06/12/20 MKELLY    Culture ENTEROCOCCUS FAECALIS (A)  Final   Report Status 06/14/2020 FINAL  Final   Organism ID, Bacteria ENTEROCOCCUS FAECALIS  Final      Susceptibility   Enterococcus faecalis - MIC*    AMPICILLIN <=2 SENSITIVE Sensitive     VANCOMYCIN 1 SENSITIVE Sensitive     GENTAMICIN SYNERGY SENSITIVE Sensitive     * ENTEROCOCCUS FAECALIS  Blood Culture ID Panel (Reflexed)     Status: Abnormal   Collection Time: 05/31/2020  2:22 PM  Result Value Ref Range Status   Enterococcus faecalis DETECTED (A) NOT DETECTED Final    Comment: CRITICAL RESULT CALLED TO, READ BACK BY AND VERIFIED WITH: G. Coffee PharmD 8:20 06/12/20 ([email or web address])     Enterococcus Faecium NOT DETECTED NOT DETECTED Final   Listeria monocytogenes NOT DETECTED NOT DETECTED Final   Staphylococcus species NOT DETECTED NOT DETECTED Final   Staphylococcus aureus (BCID) NOT DETECTED NOT DETECTED Final   Staphylococcus epidermidis NOT DETECTED NOT DETECTED Final   Staphylococcus lugdunensis NOT DETECTED NOT DETECTED Final   Streptococcus species NOT DETECTED NOT DETECTED Final   Streptococcus agalactiae NOT DETECTED NOT DETECTED Final   Streptococcus pneumoniae NOT DETECTED NOT DETECTED Final   Streptococcus pyogenes NOT DETECTED NOT DETECTED Final   A.calcoaceticus-baumannii NOT DETECTED NOT DETECTED Final   Bacteroides fragilis NOT DETECTED NOT DETECTED Final   Enterobacterales NOT DETECTED NOT DETECTED Final   Enterobacter cloacae complex NOT DETECTED NOT DETECTED Final   Escherichia coli NOT DETECTED NOT DETECTED Final   Klebsiella aerogenes NOT DETECTED NOT DETECTED Final   Klebsiella oxytoca NOT DETECTED NOT DETECTED Final   Klebsiella pneumoniae NOT DETECTED NOT DETECTED Final   Proteus species NOT DETECTED NOT DETECTED Final   Salmonella species NOT DETECTED NOT DETECTED Final   Serratia marcescens NOT DETECTED NOT DETECTED Final   Haemophilus influenzae NOT DETECTED NOT DETECTED Final   Neisseria meningitidis NOT DETECTED NOT DETECTED Final   Pseudomonas aeruginosa NOT DETECTED NOT DETECTED Final   Stenotrophomonas maltophilia NOT DETECTED NOT DETECTED Final   Candida albicans NOT DETECTED NOT DETECTED Final   Candida auris NOT DETECTED NOT DETECTED Final   Candida glabrata NOT DETECTED NOT DETECTED Final   Candida krusei NOT DETECTED NOT DETECTED Final   Candida parapsilosis NOT DETECTED NOT DETECTED Final   Candida tropicalis NOT DETECTED NOT DETECTED Final   Cryptococcus neoformans/gattii NOT DETECTED NOT DETECTED Final   Vancomycin resistance NOT DETECTED NOT DETECTED Final    Comment: Performed at [facility], [address]  [place], [place] [address]  Culture, blood (routine x 2)     Status: Abnormal   Collection Time: 06/08/2020  2:26 PM   Specimen: BLOOD LEFT HAND  Result Value Ref Range Status   Specimen Description   Final    BLOOD LEFT HAND Performed at [facility], [address], [place], [place] [address]    Special Requests   Final    NONE Performed at [facility], [address] [place], [place] [address]    Culture  Setup Time   Final    IN BOTH AEROBIC AND ANAEROBIC BOTTLES GRAM POSITIVE COCCI Gram Stain Report Called to,Read Back By and Verified With: C TURNER,RN@0436  06/12/20 [facility] Performed at [facility], [address], [place], [place] [address]    Culture (A)  Final    ENTEROCOCCUS FAECALIS SUSCEPTIBILITIES PERFORMED ON PREVIOUS CULTURE WITHIN THE LAST 5 DAYS. Performed at [facility], [address] [place], [place] [address]    Report Status 06/14/2020 FINAL  Final  Urine culture     Status: Abnormal   Collection Time: 05/31/2020  7:26 PM   Specimen: Urine, Clean Catch  Result Value Ref Range Status   Specimen Description   Final    URINE, CLEAN CATCH Performed at [facility], [address] [place], [place] [address]    Special Requests   Final    NONE Performed at [facility], [address], [place], [place] [address]    Culture >=100,000 COLONIES/mL ENTEROCOCCUS FAECALIS (A)  Final   Report Status 06/14/2020 FINAL  Final   Organism ID, Bacteria ENTEROCOCCUS FAECALIS (A)  Final      Susceptibility   Enterococcus faecalis - MIC*    AMPICILLIN <=2 SENSITIVE Sensitive     NITROFURANTOIN <=16 SENSITIVE Sensitive     VANCOMYCIN 1 SENSITIVE Sensitive     * >=100,000 COLONIES/mL ENTEROCOCCUS FAECALIS  Culture, blood (routine x 2)     Status: None (Preliminary result)   Collection Time: 06/13/20  2:52 PM   Specimen: BLOOD  Result Value Ref Range Status   Specimen Description   Final    BLOOD LEFT ANTECUBITAL Performed at [facility],  [address] [place], [place] [address]    Special Requests   Final    BOTTLES DRAWN AEROBIC AND ANAEROBIC Blood Culture adequate volume Performed at [facility], [address], [place], [place] [address]    Culture   Final    NO GROWTH 2 DAYS Performed at [facility], [address], [place], [place] [address]    Report Status PENDING  Incomplete  Culture, blood (routine x 2)     Status: Abnormal   Collection Time: 06/13/20  2:52 PM   Specimen: BLOOD  Result Value Ref Range Status   Specimen Description   Final    BLOOD BLOOD RIGHT HAND Performed at [facility], [address], [place], [place] [address]    Special Requests   Final    BOTTLES DRAWN AEROBIC AND ANAEROBIC Blood Culture adequate volume Performed at [facility], [address] [place], [place] [address]    Culture  Setup Time   Final    GRAM POSITIVE COCCI ANAEROBIC BOTTLE ONLY Gram Stain Report Called to,Read Back By and Verified With: MARYANE HOWERTON,RN @1650  06/14/2020 KAY CRITICAL RESULT CALLED TO, READ BACK BY AND VERIFIED WITH: PHRMD V BRYK @0108  06/15/20 BY S GEZAHEGN    Culture (A)  Final    ENTEROCOCCUS FAECALIS SUSCEPTIBILITIES PERFORMED ON PREVIOUS CULTURE WITHIN THE LAST 5 DAYS. Performed at [facility], [address], [place], [place] [address]    Report Status 06/26/2020 FINAL  Final  Blood Culture ID Panel (Reflexed)     Status: Abnormal   Collection Time: 06/13/20  2:52 PM  Result Value Ref Range Status   Enterococcus faecalis DETECTED (A) NOT DETECTED Final    Comment: CRITICAL RESULT CALLED TO, READ BACK BY AND VERIFIED WITH: PHRMD V BRYK @0108  06-23-2020 BY S GEZAHEGN    Enterococcus Faecium NOT DETECTED NOT DETECTED Final   Listeria monocytogenes NOT DETECTED NOT DETECTED Final   Staphylococcus species NOT DETECTED NOT DETECTED Final   Staphylococcus aureus (BCID) NOT DETECTED NOT DETECTED Final    Staphylococcus epidermidis NOT DETECTED NOT DETECTED Final   Staphylococcus lugdunensis NOT DETECTED NOT DETECTED Final   Streptococcus species NOT DETECTED NOT DETECTED Final   Streptococcus agalactiae NOT DETECTED NOT DETECTED Final   Streptococcus pneumoniae NOT DETECTED NOT DETECTED Final   Streptococcus pyogenes NOT DETECTED NOT DETECTED Final   A.calcoaceticus-baumannii NOT DETECTED NOT DETECTED Final   Bacteroides fragilis NOT DETECTED NOT DETECTED Final   Enterobacterales NOT DETECTED NOT DETECTED Final   Enterobacter cloacae complex NOT DETECTED NOT DETECTED Final   Escherichia coli NOT DETECTED NOT DETECTED Final   Klebsiella aerogenes NOT DETECTED NOT DETECTED Final   Klebsiella oxytoca NOT DETECTED NOT DETECTED Final   Klebsiella pneumoniae NOT DETECTED NOT DETECTED Final   Proteus species NOT DETECTED NOT DETECTED Final   Salmonella species NOT DETECTED NOT DETECTED Final   Serratia marcescens NOT DETECTED NOT DETECTED Final   Haemophilus influenzae NOT DETECTED NOT DETECTED Final   Neisseria meningitidis NOT DETECTED NOT DETECTED Final   Pseudomonas aeruginosa NOT DETECTED NOT DETECTED Final   Stenotrophomonas maltophilia NOT DETECTED NOT DETECTED Final   Candida albicans NOT DETECTED NOT DETECTED Final   Candida auris NOT DETECTED NOT DETECTED Final   Candida glabrata NOT DETECTED NOT DETECTED Final   Candida krusei NOT DETECTED NOT DETECTED Final   Candida parapsilosis NOT DETECTED NOT DETECTED Final   Candida tropicalis NOT DETECTED NOT DETECTED Final   Cryptococcus neoformans/gattii NOT DETECTED NOT DETECTED Final   Vancomycin resistance NOT DETECTED NOT DETECTED Final    Comment: Performed at [facility], [address] [place], [place]         Radiology Studies: EEG adult  Result Date: 2020/06/23 13/08/2019, MD     06-23-2020 11:51 AM Patient Name: Scott Rios MRN: Adron Bene Epilepsy Attending: [id number] Referring  Physician/Provider: Dr Charlsie Quest Date: 06-23-2020 Duration: 25.50 mins Patient history: 64 year old male presented with acute encephalopathy.  EEG to evaluate for seizures. Level of alertness: Awake, asleep AEDs during EEG study: LEV Technical aspects: This EEG study was done with scalp electrodes positioned according to the 10-20 International system of electrode placement. Electrical activity was acquired at a sampling rate of 500Hz  and reviewed with a high frequency filter of 70Hz  and a low frequency filter of 1Hz . EEG data were recorded continuously and digitally stored. Description: The posterior dominant rhythm consists of  8-9 Hz activity of moderate voltage (25-35 uV) seen predominantly in posterior head regions, symmetric and reactive to eye opening and eye closing. Sleep was characterized by vertex waves, sleep spindles (12 to 14 Hz), maximal frontocentral region.  EEG showed intermittent 2-3 Hz delta slowing in left centro-temporal region. Physiologic photic driving was not seen during photic stimulation.  Hyperventilation was not performed.   ABNORMALITY -Intermittent slow, left centro-temporal region IMPRESSION:  This study is suggestive of non specific cortical dysfunction in left centro-temporal region. No seizures or epileptiform discharges were seen throughout the recording. Priyanka Annabelle Harman   Overnight EEG with video  Result Date: 09-Jul-2020 Charlsie Quest, MD     July 09, 2020  9:41 AM Patient Name: Scott Rios MRN: [id number] Epilepsy Attending: Charlsie Quest Referring Physician/Provider: Dr Marvel Plan Duration: 06/15/2020 4098 to 07/09/20 0659  Patient history: 64 year old male presented with acute encephalopathy.  EEG to evaluate for seizures.  Level of alertness: Awake, asleep  AEDs during EEG study: LEV  Technical aspects: This EEG study was done with scalp electrodes positioned according to the 10-20 International system of electrode placement. Electrical activity was acquired at  a sampling rate of 500Hz  and reviewed with a high frequency filter of 70Hz  and a low frequency filter of 1Hz . EEG data were recorded continuously and digitally stored.  Description: The posterior dominant rhythm consists of  8-9 Hz activity of moderate voltage (25-35 uV) seen predominantly in posterior head regions, symmetric and reactive to eye opening and eye closing. Sleep was characterized by vertex waves, sleep spindles (12 to 14 Hz), maximal frontocentral region.  EEG showed intermittent 2-3 Hz delta slowing in left centro-temporal region. Physiologic photic driving was not seen during photic stimulation.  Hyperventilation was not performed.   One event was recorded on 06/15/2020 at 1030 during which patient was noted to be less responsive, not waking up to sternal rub.  Concomitant EEG before, during and after the event did not show any EEG change to suggest seizure.  ABNORMALITY -Intermittent slow, generalized, maximal left centro-temporal region  IMPRESSION: This study is suggestive of non specific cortical dysfunction in left centro-temporal region.  Additionally there is evidence of mild encephalopathy, nonspecific etiology. No seizures or epileptiform discharges were seen throughout the recording. One event was recorded on 06/15/1020 at 10:30 AM during which patient was noted to be less responsive without concomitant EEG change and was not an epileptic event.  Priyanka        Scheduled Meds: . atorvastatin  80 mg Oral Daily  . insulin aspart  0-15 Units Subcutaneous Q4H  . insulin detemir  10 Units Subcutaneous Daily  . metoprolol tartrate  2.5 mg Intravenous Q8H  . multivitamin with minerals  1 tablet Oral Daily  . tamsulosin  0.4 mg Oral Daily  . thiamine injection  100 mg Intravenous Daily   Or  . thiamine  100 mg Oral Daily   Continuous Infusions: . sodium chloride 40 mL/hr at 07/09/20 0417  . ampicillin (OMNIPEN) IV    . cefTRIAXone (ROCEPHIN)  IV 2 g (06/15/20 2310)    . heparin 1,550 Units/hr (Jul 09, 2020 0520)  . levETIRAcetam 500 mg (07-09-2020 0926)     LOS: 5 days   Time spent= 35 mins    Conor Lata 13/01/21, MD [facility]  If 7PM-7AM, please contact night-coverage  July 09, 2020, 10:57 AM

## 2020-07-15 NOTE — Progress Notes (Signed)
ANTICOAGULATION CONSULT NOTE  Pharmacy Consult for Heparin Indication: atrial fibrillation/valve replacements  Assessment: 64 YOM on warfarin PTA for hx Afib/AVR transitioned to Heparin while awaiting further work-up of strokes and bacteremia. Pharmacy consulted to dose.   Heparin level remains subtherapeutic but is now detectable at 0.16 units/ml.  No issues noted with infusion.  No bleeding reported  Goal of Therapy:  HL 0.3-0.5 Monitor platelets by anticoagulation protocol: Yes   Plan:  - Increase Heparin to 1550 units/hr - Will continue to monitor for any signs/symptoms of bleeding and will follow up with heparin level in 6 hours   Thanks for allowing pharmacy to be a part of this patient's care.  Talbert Cage, PharmD Clinical Pharmacist

## 2020-07-15 NOTE — Progress Notes (Signed)
ANTICOAGULATION CONSULT NOTE  Pharmacy Consult for Heparin Indication: atrial fibrillation/valve replacements  Patient Measurements: Height: 5\' 7"  (170.2 cm) Weight: 57.8 kg (127 lb 6.8 oz) IBW/kg (Calculated) : 66.1 Heparin Dosing Weight: HEPARIN DW (KG): 57.8  Vital Signs: Temp: 98 F (36.7 C) (11/02 0823) Temp Source: Oral (11/02 0823) BP: 113/65 (11/02 0823) Pulse Rate: 92 (11/02 0823)  Labs: Recent Labs    06/14/20 0556 06/14/20 2150 06/15/20 0913 06/15/20 1816 06/23/2020 0406  HGB 8.5*  --   --   --  8.8*  HCT 26.2*  --   --   --  27.2*  PLT 238  --   --   --  309  LABPROT 16.9*  --  15.6*  --  15.3*  INR 1.4*  --  1.3*  --  1.3*  HEPARINUNFRC  --    < > <0.10* <0.10* 0.16*  CREATININE 0.36*  --   --   --   --    < > = values in this interval not displayed.    Estimated Creatinine Clearance: 75.3 mL/min (A) (by C-G formula based on SCr of 0.36 mg/dL (L)).   Assessment: 64 YOM on warfarin PTA for hx Afib/AVR transitioned to Heparin while awaiting further work-up of strokes and bacteremia. Pharmacy consulted to dose.   Heparin level this afternoon is therapeutic after a rate increase earlier today (HL 0.34 << 0.16, goal of 0.3-0.5). Hgb low but stable - 8.8, plts wnl. No bleeding or issues noted per discussion with the RN.  Goal of Therapy:  HL 0.3-0.5 Monitor platelets by anticoagulation protocol: Yes   Plan:  - Continue Heparin at 1550 units/hr (15.5 ml/hr) - Will continue to monitor for any signs/symptoms of bleeding and will follow up with heparin level in 6 hours to confirm therapeutic   Thank you for allowing pharmacy to be a part of this patient's care.  13/02/21, PharmD, BCPS Clinical Pharmacist Clinical phone for 06/25/2020: 13/09/2019 06/27/2020 9:34 AM   **Pharmacist phone directory can now be found on [email or web address] ([email or web address]).  Listed under [facility].

## 2020-07-15 NOTE — Progress Notes (Signed)
Patient was transported to the morgue.

## 2020-07-15 NOTE — Progress Notes (Signed)
Nurse messaged me little after 7pm, stating patient passed away. When I spoke with her, she told me patient was doing fine until few mins ago and suddenly telemetry called stating he was getting hypoxic, tachypanic and tachycardic. Apparently RRT was called by RN and soon after they arrived patient became bradycardiac and passed away.   I had visited him around 3pm when he was doing well without any complaints. His sister and RN were present at the bedside. Although he appeared quite chronically ill with multiple coomorbidities he was not in any acute distress.   Wonder if patient had septic embolic event- Pulmonary and/or neuro.   I called his sons and Amada Jupiter, sister but no response.   Please call with any further questions if needed.   Amaryllis Malmquist TRH

## 2020-07-15 NOTE — Progress Notes (Signed)
[facility]  Date of Admission:  05/25/2020      Total days of antibiotics 6  Ampicillin (Day 2) + Ceftriaxone (Day 5)           ASSESSMENT: Scott Rios is a 64 y.o. male with enterococcal bacteremia in the setting of bilateral new acute strokes with a history of AVR and MV ring repair, suspicious for prosthetic valve endocarditis.   Tolerated amoxicillin and ampicillin without any trouble - will continue dual therapy Ampicillin + Ceftriaxone BID for treatment. Persistently bacteremic reflecting heavy burden of disease; follow for clearance. Hopefully now that he is tolerating optimal medical treatment this will help. Continue to follow for adverse reactions. Hold on central access for now until we can establish clearance.   Unfortunately unable to consent him for TEE today and there is some question regarding HPOA - possible ethics consultation needed.  EEG ongoing with neurology following - no seizures, +encephalopathy that is non-specific. No EEG changes during acute unresponsive events noted yesterday.     PLAN: 1. Continue Ampicillin + Ceftriaxone BID 2. Follow blood cultures for clearance  3. Hold on PICC for now 4. Follow for TEE     Principal Problem:   Enterococcus Fecalis bacteremia Active Problems:   S/P mitral valve replacement   Status post aortic valve replacement with bioprosthetic valve   Acute CVA (cerebrovascular accident) / Septic Emboli Vs Afib Related CVAs   Sepsis due to Enterococcus-Enterococcus faecalis bacteremia and sepsis   Tobacco abuse   Mitral regurgitation   S/P CABG x 6   Malnutrition of moderate degree   CAD (coronary artery disease)   Atrial fibrillation (HCC)   Current use of long term anticoagulation   . atorvastatin  80 mg Oral Daily  . insulin aspart  0-15 Units Subcutaneous Q4H  . insulin detemir  10 Units Subcutaneous Daily  . metoprolol tartrate  2.5 mg Intravenous Q8H  . multivitamin  with minerals  1 tablet Oral Daily  . tamsulosin  0.4 mg Oral Daily  . thiamine injection  100 mg Intravenous Daily   Or  . thiamine  100 mg Oral Daily    SUBJECTIVE: Swatting away his nurse and EEG tech. Verbalizes non-specifically he is in pain.    Review of Systems: Review of Systems  Unable to perform ROS: Medical condition    Allergies  Allergen Reactions  . Tramadol Other (See Comments)    Itchiness, visual hallucination.    OBJECTIVE: Vitals:   06/27/20 0607 06/27/2020 0714 06/27/20 0823 2020-06-27 0827  BP: 123/65 (!) 116/58 113/65   Pulse: 87 92 92   Resp: 19 (!) 25 19 20   Temp: 97.9 F (36.6 C) 97.8 F (36.6 C) 98 F (36.7 C)   TempSrc: Oral Oral Oral   SpO2: 95% 94% 94%   Weight:  57.8 kg    Height:  5\' 7"  (1.702 m)     Body mass index is 19.96 kg/m.  Physical Exam Vitals reviewed.  Constitutional:      Comments: Sitting upright in bed. Agitated. Only selectively answers some questions.   Cardiovascular:     Rate and Rhythm: Normal rate. Rhythm irregular.  Pulmonary:     Effort: Pulmonary effort is normal.  Skin:    General: Skin is warm and dry.     Coloration: Skin is pale.  Neurological:     Mental Status: He is alert. He is disoriented.   Not cooperating  for full exam  Lab Results Lab Results  Component Value Date   WBC 24.2 (H) 07/03/2020   HGB 8.8 (L) 06/23/2020   HCT 27.2 (L) 06/23/2020   MCV 81.4 07/02/2020   PLT 309 06/27/2020    Lab Results  Component Value Date   CREATININE 0.36 (L) 06/14/2020   BUN 12 06/14/2020   NA 128 (L) 06/14/2020   K 3.1 (L) 06/14/2020   CL 94 (L) 06/14/2020   CO2 27 06/14/2020    Lab Results  Component Value Date   ALT 23 Jun 28, 2020   AST 18 June 28, 2020   ALKPHOS 139 (H) 2020-06-28   BILITOT 0.9 2020-06-28     Microbiology: BCx 10/28 >>  + Enterococcus Faecalis BCx 10/30 >> + Enterococcus Faecalis BCx 11/01 >> no growth, preliminary   Rexene Alberts, MSN, NP-C [facility]  [email or web address] Pager: [phone] Office: [phone] [facility] Main Line: [phone]

## 2020-07-15 NOTE — Progress Notes (Signed)
LTM discontinued; no skin breakdown was seen. [facility] notified of D/C. 

## 2020-07-15 NOTE — Progress Notes (Signed)
Mr. Scott Rios was scheduled for TEE at 7:30 this am.  There is no signed consent form on chart  He is not able to give informed consent. Could not reach his son, Chanetta Marshall. Spoke to his sister, Amada Jupiter who stated that he did not have power of attorney.  Will need for the medical teams/ neuro team to arrange for informed consent . We may need to consult with ethics committee at [facility] .  Will cancel TEE for today .    Kristeen Miss, MD  07/04/2020 8:05 AM    [facility] [address] [place],  [address] [place], [place]  [address] Phone: [phone]; Fax: [phone]

## 2020-07-15 NOTE — Progress Notes (Signed)
ANTICOAGULATION CONSULT NOTE  Pharmacy Consult for Heparin Indication: atrial fibrillation/valve replacements  Patient Measurements: Height: 5\' 7"  (170.2 cm) Weight: 57.8 kg (127 lb 6.8 oz) IBW/kg (Calculated) : 66.1 Heparin Dosing Weight: HEPARIN DW (KG): 57.8  Vital Signs: Temp: 98 F (36.7 C) (11/02 1523) Temp Source: Oral (11/02 1523) BP: 116/60 (11/02 1523) Pulse Rate: 93 (11/02 1523)  Labs: Recent Labs    06/14/20 0556 06/14/20 2150 06/15/20 0913 06/15/20 1816 06/30/2020 0406 06/23/2020 1323 06/26/2020 1836  HGB 8.5*  --   --   --  8.8*  --   --   HCT 26.2*  --   --   --  27.2*  --   --   PLT 238  --   --   --  309  --   --   LABPROT 16.9*  --  15.6*  --  15.3*  --   --   INR 1.4*  --  1.3*  --  1.3*  --   --   HEPARINUNFRC  --    < > <0.10*   < > 0.16* 0.34 0.39  CREATININE 0.36*  --   --   --   --   --   --    < > = values in this interval not displayed.    Estimated Creatinine Clearance: 75.3 mL/min (A) (by C-G formula based on SCr of 0.36 mg/dL (L)).   Assessment: 64 YOM on warfarin PTA for hx Afib/AVR transitioned to Heparin while awaiting further work-up of strokes and bacteremia. Pharmacy consulted to dose.  Hep lvl within goal  Goal of Therapy:  HL 0.3-0.5 Monitor platelets by anticoagulation protocol: Yes   Plan:  - Continue Heparin at 1550 units/hr - daily hep lvl cbc  13/02/21, PharmD, BCPS, BCCCP Clinical Pharmacist [phone]  Please check AMION for all [facility] Pharmacy numbers  06/21/2020 7:16 PM

## 2020-07-15 NOTE — Progress Notes (Signed)
STROKE TEAM PROGRESS NOTE   INTERVAL HISTORY RN at bedside, feeding him for lunch.  Patient reclining in bed, complaining of abdominal pain.  Palpated abdomen, slight tenderness throughout but abdomen soft.  Ordered KUB showed increase in stool.  Will consider to add laxatives.  Vitals:   06-24-2020 0823 06/24/20 0827 Jun 24, 2020 1117 2020-06-24 1523  BP: 113/65  (!) 103/58 116/60  Pulse: 92  88 93  Resp: 19 20 (!) 24 19  Temp: 98 F (36.7 C)  98.1 F (36.7 C) 98 F (36.7 C)  TempSrc: Oral  Oral Oral  SpO2: 94%  97% 95%  Weight:      Height:       CBC:  Recent Labs  Lab 05/24/2020 1418 06/12/20 0608 06/14/20 0556 24-Jun-2020 0406  WBC 21.7*   < > 20.8* 24.2*  NEUTROABS 20.2*  --   --   --   HGB 10.5*   < > 8.5* 8.8*  HCT 32.4*   < > 26.2* 27.2*  MCV 82.9   < > 82.6 81.4  PLT 191   < > 238 309   < > = values in this interval not displayed.   Basic Metabolic Panel:  Recent Labs  Lab 06/14/2020 1418 06/10/2020 1418 06/12/20 0608 06/14/20 0556 06-24-20 0406  NA 128*   < > 132* 128*  --   K 3.8   < > 3.3* 3.1*  --   CL 90*   < > 95* 94*  --   CO2 30   < > 30 27  --   GLUCOSE 425*   < > 96 104*  --   BUN 12   < > 13 12  --   CREATININE 0.51*   < > 0.46* 0.36*  --   CALCIUM 8.4*   < > 8.4* 7.9*  --   MG 1.8  --   --   --  1.8   < > = values in this interval not displayed.   Lipid Panel:  Recent Labs  Lab 06/12/20 0608  CHOL 62  TRIG 61  HDL 19*  CHOLHDL 3.3  VLDL 12  LDLCALC 31   HgbA1c:  Recent Labs  Lab 06/13/2020 1418  HGBA1C 11.7*   Urine Drug Screen:  Recent Labs  Lab 06/12/2020 1927  LABOPIA NONE DETECTED  COCAINSCRNUR NONE DETECTED  LABBENZ NONE DETECTED  AMPHETMU NONE DETECTED  THCU NONE DETECTED  LABBARB NONE DETECTED    Alcohol Level No results for input(s): ETH in the last 168 hours.  IMAGING past 24 hours DG Abd 1 View  Result Date: June 24, 2020 CLINICAL DATA:  Altered mental status, abdominal pain EXAM: ABDOMEN - 1 VIEW COMPARISON:  None FINDINGS:  Post MVR and AVR. Mild atelectasis versus infiltrate in retrocardiac LEFT lower lobe. Surgical clips RIGHT upper quadrant consistent with history of cholecystectomy. Increased stool throughout proximal half of colon. Air-filled nondistended small bowel loops. No evidence of bowel obstruction or bowel wall thickening. Inferior pelvis excluded. Bones demineralized with scattered degenerative disc disease changes lumbar spine. Atherosclerotic calcifications in pelvis. IMPRESSION: Increased stool throughout proximal half of colon. Electronically Signed   By: Ulyses Southward M.D.   On: 24-Jun-2020 14:58   Overnight EEG with video  Result Date: 2020-06-24 Charlsie Quest, MD     Jun 24, 2020  3:02 PM Patient Name: Scott Rios MRN: [id number] Epilepsy Attending: Charlsie Quest Referring Physician/Provider: Dr Marvel Plan Duration: 06/15/2020 9211 to Jun 24, 2020 9417, [id number]  Patient history: 64 year old male presented  with acute encephalopathy.  EEG to evaluate for seizures.  Level of alertness: Awake, asleep  AEDs during EEG study: LEV  Technical aspects: This EEG study was done with scalp electrodes positioned according to the 10-20 International system of electrode placement. Electrical activity was acquired at a sampling rate of 500Hz  and reviewed with a high frequency filter of 70Hz  and a low frequency filter of 1Hz . EEG data were recorded continuously and digitally stored.  Description: The posterior dominant rhythm consists of  8-9 Hz activity of moderate voltage (25-35 uV) seen predominantly in posterior head regions, symmetric and reactive to eye opening and eye closing. Sleep was characterized by vertex waves, sleep spindles (12 to 14 Hz), maximal frontocentral region.  EEG showed intermittent 2-3 Hz delta slowing in left centro-temporal region.    One event was recorded on 06/15/2020 at 1030 during which patient was noted to be less responsive, not waking up to sternal rub.  Concomitant EEG before, during  and after the event did not show any EEG change to suggest seizure.  ABNORMALITY -Intermittent slow, generalized, maximal left centro-temporal region  IMPRESSION: This study is suggestive of non specific cortical dysfunction in left centro-temporal region.  Additionally there is evidence of mild encephalopathy, nonspecific etiology. No seizures or epileptiform discharges were seen throughout the recording. One event was recorded on 06/15/1020 at 10:30 AM during which patient was noted to be less responsive without concomitant EEG change and was not an epileptic event.  Priyanka    PHYSICAL EXAM    Temp:  [97.8 F (36.6 C)-99.5 F (37.5 C)] 98 F (36.7 C) (11/02 1523) Pulse Rate:  [87-93] 93 (11/02 1523) Resp:  [18-28] 19 (11/02 1523) BP: (103-135)/(58-72) 116/60 (11/02 1523) SpO2:  [93 %-97 %] 95 % (11/02 1523) Weight:  [57.8 kg] 57.8 kg (11/02 0714)  General - Well nourished, well developed, lethargic with mild distress from abdominal pain.  Ophthalmologic - fundi not visualized due to noncooperation.  Cardiovascular - irregularly irregular heart rate and rhythm.  Abdomen - soft, slight tenderness in all quadrants on palpation  Neuro - awake alert, lethargic, reclining in bed, being fed for lunch. Eyes open, orientated to place, age, year but said "October". Mild psychomotor slowing. Following all simple commands, able to name 3/3 and able to repeat. Visual field full, gaze bilaterally without problem, tracking both sides. No facial droop, tongue midline. RUE at least 4/5, but LUE 3+/5 proximal, and 3/5 bicep and tricep, finger grip 1-2/5. LLE proximal at least 2/5 and ankle PF/DF 3/5. However, RLE proximal 0/5, ankle PF/DF 1/5. However, with pain, RLE was able to have ankle PF. Sensation decreased on the LUE and diminished on the RLE. FTN bilateral intact although slow on the left. Gait not tested.    ASSESSMENT/PLAN Mr. Scott Rios is a 64 y.o. male with history of  DM2,  HTN, smoker, EtOH use, CAD, severe aortic stenosis and insufficiency s/p CABG and aortic valve replacement and mitral valve annuloplasty, acute systolic heart failure, depression, post op recurrent afib on warfarin for at least 3 mos presenting to [facility] with acute encephalopathy w/ enterrococcus bacteremia/sepsis and embolic appearing strokes.   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 Endocarditis in setting of bacteremia vs AF on coumadin  CT head 10/28 new R periventricular hypodensity  MRI  Numerous scattered cerebellar and B cerebral infarcts (largest R posterior frontal)  MRA  R M1 30% narrowing   Repeat CT  head 10/30 multifocal hypoattenuation c/w recent infarcts seen on MRI  Carotid Doppler  B ICA < 50% atherosclerosis   2D Echo EF 55-60%. No source of embolus. LA severely dilated. MV annuloplasty ring in place. AV valve replacement/repair w/ severe calcification and thickening. No AS.  TEE canceled as no one to consent. Ethics team agrees to sister consent as son not available. TEE rescheduled 11/5 at 730a   LDL 31  HgbA1c 11.7  VTE prophylaxis - IV heparin  warfarin daily prior to admission, now on heparin IV. If negative for endocarditis, will resume warfarin.   Therapy recommendations:  SNF  Disposition:  pending   Recurrent Unresponsive Episodes, ? pseudoseizures  3 episodes in [facility], 1 episode [facility] 06/15/20  Episodes quick on and off  Loaded w/ Keppra  EEG no sz  Episode during EEG not associated w/ any EEG change or sz  LT EEG maximalleft centro-temporal region Intermittent slowing  D/c LTM EEG 11/2  Continue keppra for now  Post op atrial Fibrillation  Home anticoagulation:  warfarin daily   INR 1.9 on admission  EKG during admission showed afib  On IV heparin   Sepsis secondary to Enterococcus faecalis bacteremia from UTI  WBC 20.8->24.2  TMax 100.6->afebrile  Vanc  10/29>>11/1  Ceftriaxone 10/29>>  Ampicillin 11/1>>  TEE to look for endocarditis  Bcx 10/28 & 10/30 +enterococcus faecalis    Abdominal pain, mild  Mild tenderness on palpation in all quadrants  Abdomen soft  KUB showed increased stool  Will discuss with Dr. Nelson Chimes regarding laxatives  Hypertension  Stable . Long-term BP goal normotensive  Hyperlipidemia  Home meds:  Atorvastatin 80, resumed in hospital  LDL 31, goal < 70  Continue statin at discharge  Diabetes type II Uncontrolled  HgbA1c 11.7, goal < 7.0  CBGs  SSI  No significant hyperglycemia  PCP close follow up  Other Stroke Risk Factors  Advanced Age >/= 62   Former smoker, quit in Aug 2021  Coronary artery disease s/p CABG x6, mitral valve repair, aortic valve replacement 03/2020  Chronic diastolic Congestive heart failure  Other Active Problems  Hyponatremia Na 128->132->128  Hypokalemia  K 3.8->3.3->3.1  Hospital day # 5   Marvel Plan, MD PhD Stroke Neurology 07/05/2020 4:57 PM   To contact Stroke Continuity provider, please refer to [email or web address]. After hours, contact General Neurology

## 2020-07-15 NOTE — Death Summary Note (Signed)
Death Summary  Scott Rios ZOX:[id number] DOB: [birth year] DOA: June 22, 2020  PCP: Elfredia Nevins, MD  Admit date: 06/22/20 Date of Death: 06/27/20 Time of Death: 6:55 PM Notification: Elfredia Nevins, MD notified of death of 2020-06-28    Code Status= DNR  Brief Summary/Hospital Stay:  64 y.o.malewith PMH significant for DM2, HTN, smoker, EtOH use, CAD, severe aortic stenosis and insufficiency s/p CABG and aortic valve replacement and mitral valve annuloplasty, acute systolic heart failure, depression, post op recurrent afibb and was placed on warfarin for atleast 3 months who is admitted to [facility] with acute encephalopathy. Found to have enterococcus bacteremia/sepsis and embolic appearing strokes.  Patient transferred to [facility] for further care.    TEE was planned for this patient but due to difficulty in reaching the family it was briefly postponed but patient remained on IV Abx per infectious disease guidance.  He also had EEG performed to rule out seizure activities.  On the day of his passing, he was chronically and ill-appearing but not in any acute distress.  Suddenly in the evening he became hypoxic and tachypneic.  RRT was called but soon after he passed away.   On physical exam: Performed by the nursing staff and RRT.   Final/Principal Diagnoses:  #1 acute embolic CVA #2 sepsis secondary to enterococcal faecalis bacteremia #3 CAD status post CABG X6, mitral valve repair, aortic valve replacement #4 diabetes mellitus type 2  Disposition/Follow up Care: Patient is deceased.   Discharge medications: None  The results of significant diagnostics from this hospitalization (including imaging, microbiology, ancillary and laboratory) are listed below for reference.    Significant Diagnostic Studies: DG Abd 1 View  Result Date: June 27, 2020 CLINICAL DATA:  Altered mental status, abdominal pain EXAM: ABDOMEN - 1 VIEW COMPARISON:  None FINDINGS:  Post MVR and AVR. Mild atelectasis versus infiltrate in retrocardiac LEFT lower lobe. Surgical clips RIGHT upper quadrant consistent with history of cholecystectomy. Increased stool throughout proximal half of colon. Air-filled nondistended small bowel loops. No evidence of bowel obstruction or bowel wall thickening. Inferior pelvis excluded. Bones demineralized with scattered degenerative disc disease changes lumbar spine. Atherosclerotic calcifications in pelvis. IMPRESSION: Increased stool throughout proximal half of colon. Electronically Signed   By: Ulyses Southward M.D.   On: 27-Jun-2020 14:58   CT HEAD WO CONTRAST  Result Date: 06/13/2020 CLINICAL DATA:  Encephalopathy EXAM: CT HEAD WITHOUT CONTRAST TECHNIQUE: Contiguous axial images were obtained from the base of the skull through the vertex without intravenous contrast. COMPARISON:  22-Jun-2020 FINDINGS: Brain: Multifocal hypoattenuation corresponding to recent infarcts identified on MRI. No hemorrhage. No midline shift or other mass effect. Vascular: Atherosclerotic calcification of the internal carotid arteries at the skull base. No abnormal hyperdensity of the major intracranial arteries or dural venous sinuses. Skull: The visualized skull base, calvarium and extracranial soft tissues are normal. Sinuses/Orbits: No fluid levels or advanced mucosal thickening of the visualized paranasal sinuses. No mastoid or middle ear effusion. The orbits are normal. IMPRESSION: Multifocal hypoattenuation corresponding to recent infarcts identified on MRI. No hemorrhage or mass effect. Electronically Signed   By: Deatra Robinson M.D.   On: 06/13/2020 23:31   CT Head Wo Contrast  Result Date: 22-Jun-2020 CLINICAL DATA:  Altered mental status. EXAM: CT HEAD WITHOUT CONTRAST TECHNIQUE: Contiguous axial images were obtained from the base of the skull through the vertex without intravenous contrast. COMPARISON:  April 03, 2020. FINDINGS: Brain: New low density is seen in  right periventricular white matter concerning  for infarction of indeterminate age. No hemorrhage or mass lesion is noted. No midline shift is noted. Ventricular size is within normal limits. Vascular: No hyperdense vessel or unexpected calcification. Skull: Normal. Negative for fracture or focal lesion. Sinuses/Orbits: No acute finding. Other: None. IMPRESSION: New low density is seen in right periventricular white matter concerning for infarction of indeterminate age. MRI is recommended for further evaluation. Electronically Signed   By: Lupita Raider M.D.   On: Jun 12, 2020 15:30   MR ANGIO HEAD WO CONTRAST  Result Date: 06-12-20 CLINICAL DATA:  Follow-up stroke.  Altered mental status. EXAM: MRI HEAD WITHOUT CONTRAST MRA HEAD WITHOUT CONTRAST TECHNIQUE: Multiplanar, multiecho pulse sequences of the brain and surrounding structures were obtained without intravenous contrast. Angiographic images of the head were obtained using MRA technique without contrast. COMPARISON:  Head CT same day.  Head CT 04/03/2020.  MRI 03/28/2020. FINDINGS: MRI HEAD FINDINGS Brain: Diffusion imaging shows numerous widely scattered acute infarctions consistent with embolic disease from the heart or ascending aorta. Scattered punctate infarctions are present within the cerebellum. Right cerebral hemisphere shows small infarctions in the frontal and parietal white matter, with a larger, 1.5 cm acute infarction in the right posterior frontal cortical and subcortical brain. On the left, there are more numerous punctate infarctions in the frontal and parietal lobes, with several areas of cortical involvement in the parietal region. No evidence of swelling, hemorrhage or mass effect. Elsewhere, there chronic small-vessel ischemic changes of the pons and cerebral hemispheric white matter. No hydrocephalus or extra-axial fluid collection. Vascular: Major vessels at the base of the brain show flow. Skull and upper cervical spine: Negative  Sinuses/Orbits: Clear/normal Other: None MRA HEAD FINDINGS Both internal carotid arteries are patent through the skull base and siphon regions. The anterior and middle cerebral vessels are patent without discernible large or medium vessel occlusion. No correctable proximal stenosis. 30% narrowing of the right M1 segment. Both vertebral arteries are patent to the basilar. No basilar stenosis. Posterior circulation branch vessels are patent. IMPRESSION: 1. Numerous widely scattered acute infarctions in the cerebellum and both cerebral hemispheres consistent with embolic disease from the heart or ascending aorta. Largest, 1.5 cm acute infarction is in the right posterior frontal cortical and subcortical brain. No evidence of swelling, hemorrhage or mass effect. 2. MR angiography does not show any large or medium vessel occlusion or correctable proximal stenosis. 30% narrowing of the right M1 segment. Electronically Signed   By: Paulina Fusi M.D.   On: June 12, 2020 18:59   MR BRAIN WO CONTRAST  Result Date: 06/12/20 CLINICAL DATA:  Follow-up stroke.  Altered mental status. EXAM: MRI HEAD WITHOUT CONTRAST MRA HEAD WITHOUT CONTRAST TECHNIQUE: Multiplanar, multiecho pulse sequences of the brain and surrounding structures were obtained without intravenous contrast. Angiographic images of the head were obtained using MRA technique without contrast. COMPARISON:  Head CT same day.  Head CT 04/03/2020.  MRI 03/28/2020. FINDINGS: MRI HEAD FINDINGS Brain: Diffusion imaging shows numerous widely scattered acute infarctions consistent with embolic disease from the heart or ascending aorta. Scattered punctate infarctions are present within the cerebellum. Right cerebral hemisphere shows small infarctions in the frontal and parietal white matter, with a larger, 1.5 cm acute infarction in the right posterior frontal cortical and subcortical brain. On the left, there are more numerous punctate infarctions in the frontal and  parietal lobes, with several areas of cortical involvement in the parietal region. No evidence of swelling, hemorrhage or mass effect. Elsewhere, there chronic small-vessel ischemic changes of  the pons and cerebral hemispheric white matter. No hydrocephalus or extra-axial fluid collection. Vascular: Major vessels at the base of the brain show flow. Skull and upper cervical spine: Negative Sinuses/Orbits: Clear/normal Other: None MRA HEAD FINDINGS Both internal carotid arteries are patent through the skull base and siphon regions. The anterior and middle cerebral vessels are patent without discernible large or medium vessel occlusion. No correctable proximal stenosis. 30% narrowing of the right M1 segment. Both vertebral arteries are patent to the basilar. No basilar stenosis. Posterior circulation branch vessels are patent. IMPRESSION: 1. Numerous widely scattered acute infarctions in the cerebellum and both cerebral hemispheres consistent with embolic disease from the heart or ascending aorta. Largest, 1.5 cm acute infarction is in the right posterior frontal cortical and subcortical brain. No evidence of swelling, hemorrhage or mass effect. 2. MR angiography does not show any large or medium vessel occlusion or correctable proximal stenosis. 30% narrowing of the right M1 segment. Electronically Signed   By: Paulina Fusi M.D.   On: 05/23/2020 18:59   US Carotid Bilateral (at [facility] and [facility] only)  Result Date: 06/12/2020 CLINICAL DATA:  CVA. EXAM: BILATERAL CAROTID DUPLEX ULTRASOUND TECHNIQUE: Wallace Cullens scale imaging, color Doppler and duplex ultrasound were performed of bilateral carotid and vertebral arteries in the neck. COMPARISON:  MRI brain 06/03/2020 FINDINGS: Criteria: Quantification of carotid stenosis is based on velocity parameters that correlate the residual internal carotid diameter with NASCET-based stenosis levels, using the diameter of the distal internal carotid lumen as the denominator for stenosis  measurement. The following velocity measurements were obtained: RIGHT ICA: 93/23 cm/sec CCA: 114/15 cm/sec SYSTOLIC ICA/CCA RATIO:  0.9 ECA: 156 cm/sec LEFT ICA: 97/34 cm/sec CCA: 103/14 cm/sec SYSTOLIC ICA/CCA RATIO:  0.8 ECA: 120 cm/sec RIGHT CAROTID ARTERY: Heterogeneous plaque in the right common carotid artery. Small amount of echogenic plaque at the carotid bulb. External carotid artery is patent with normal waveform. Small amount of plaque in the proximal internal carotid artery. Normal waveforms and velocities in the internal carotid artery. RIGHT VERTEBRAL ARTERY: Antegrade flow and normal waveform in the right vertebral artery. LEFT CAROTID ARTERY: Heterogeneous and echogenic plaque in the left common carotid artery. Echogenic plaque at the left carotid bulb. External carotid artery is patent with normal waveform. Plaque in the left internal carotid artery. Normal waveforms and velocities in the internal carotid artery. LEFT VERTEBRAL ARTERY: Antegrade flow and normal waveform in the left vertebral artery. IMPRESSION: 1. Atherosclerotic disease involving bilateral carotid arteries. Estimated degree of stenosis in the internal carotid arteries is less than 50% bilaterally. 2. Patent vertebral arteries with antegrade flow. Electronically Signed   By: Richarda Overlie M.D.   On: 06/12/2020 11:58   DG Chest Port 1 View  Result Date: 06/10/2020 CLINICAL DATA:  Decreased eating and drinking. History of heart surgery 2 months ago. EXAM: PORTABLE CHEST 1 VIEW COMPARISON:  Chest radiograph dated 05/13/2020 FINDINGS: The heart size and mediastinal contours are within normal limits. Both lungs are clear. The visualized skeletal structures are unremarkable. Median sternotomy wires, cardiac valve prosthesis, and atrial appendage clip are redemonstrated. IMPRESSION: No active disease. Electronically Signed   By: Romona Curls M.D.   On: 05/16/2020 15:19   EEG adult  Result Date: 06/15/2020 Charlsie Quest, MD      06/15/2020 11:51 AM Patient Name: Scott Rios MRN: [id number] Epilepsy Attending: Charlsie Quest Referring Physician/Provider: Dr Marvel Plan Date: 06/15/2020 Duration: 25.50 mins Patient history: 64 year old male presented with acute encephalopathy.  EEG to evaluate  for seizures. Level of alertness: Awake, asleep AEDs during EEG study: LEV Technical aspects: This EEG study was done with scalp electrodes positioned according to the 10-20 International system of electrode placement. Electrical activity was acquired at a sampling rate of 500Hz  and reviewed with a high frequency filter of 70Hz  and a low frequency filter of 1Hz . EEG data were recorded continuously and digitally stored. Description: The posterior dominant rhythm consists of  8-9 Hz activity of moderate voltage (25-35 uV) seen predominantly in posterior head regions, symmetric and reactive to eye opening and eye closing. Sleep was characterized by vertex waves, sleep spindles (12 to 14 Hz), maximal frontocentral region.  EEG showed intermittent 2-3 Hz delta slowing in left centro-temporal region. Physiologic photic driving was not seen during photic stimulation.  Hyperventilation was not performed.   ABNORMALITY -Intermittent slow, left centro-temporal region IMPRESSION: This study is suggestive of non specific cortical dysfunction in left centro-temporal region. No seizures or epileptiform discharges were seen throughout the recording. Priyanka Annabelle Harman Yadav   Overnight EEG with video  Result Date: 07/07/2020 Charlsie QuestYadav, Priyanka O, MD     07/01/2020  3:02 PM Patient Name: Scott Rios MRN: [id number] Epilepsy Attending: Charlsie QuestPriyanka O Yadav Referring Physician/Provider: Dr Marvel PlanJindong Xu Duration: 06/15/2020 [id number] to 06/30/2020 [id number], [id number]  Patient history: 64 year old male presented with acute encephalopathy.  EEG to evaluate for seizures.  Level of alertness: Awake, asleep  AEDs during EEG study: LEV  Technical aspects: This EEG study was done with scalp  electrodes positioned according to the 10-20 International system of electrode placement. Electrical activity was acquired at a sampling rate of 500Hz  and reviewed with a high frequency filter of 70Hz  and a low frequency filter of 1Hz . EEG data were recorded continuously and digitally stored.  Description: The posterior dominant rhythm consists of  8-9 Hz activity of moderate voltage (25-35 uV) seen predominantly in posterior head regions, symmetric and reactive to eye opening and eye closing. Sleep was characterized by vertex waves, sleep spindles (12 to 14 Hz), maximal frontocentral region.  EEG showed intermittent 2-3 Hz delta slowing in left centro-temporal region.    One event was recorded on 06/15/2020 at 1030 during which patient was noted to be less responsive, not waking up to sternal rub.  Concomitant EEG before, during and after the event did not show any EEG change to suggest seizure.  ABNORMALITY -Intermittent slow, generalized, maximal left centro-temporal region  IMPRESSION: This study is suggestive of non specific cortical dysfunction in left centro-temporal region.  Additionally there is evidence of mild encephalopathy, nonspecific etiology. No seizures or epileptiform discharges were seen throughout the recording. One event was recorded on 06/15/1020 at 10:30 AM during which patient was noted to be less responsive without concomitant EEG change and was not an epileptic event.  Charlsie Questriyanka O Yadav   ECHOCARDIOGRAM COMPLETE  Result Date: 06/12/2020    ECHOCARDIOGRAM REPORT   Patient Name:   Scott BeneHOMAS E Mctighe Date of Exam: 06/12/2020 Medical Rec #:  [id number]      Height:       67.0 in Accession #:    [id number]     Weight:       127.4 lb Date of Birth:  1955/10/08      BSA:          1.670 m Patient Age:    64 years       BP:           108/61 mmHg Patient Gender: M  HR:           87 bpm. [facility] Procedure: 2D Echo, Color Doppler and Cardiac Doppler Indications:     Stroke 434.91 / I163.9  History:        Patient has prior history of Echocardiogram examinations, most                 recent 06/05/2020. Risk Factors:Hypertension and Diabetes.                 Aortic Valve: 23 mm valve is present in the aortic position.                 Procedure Date: 04/09/2020.  Sonographer:    Eulah Pont RDCS Referring Phys: [id number] DAWOOD S ELGERGAWY IMPRESSIONS  1. Left ventricular ejection fraction, by estimation, is 55 to 60%. The left ventricle has normal function. Left ventricular endocardial border not optimally defined to evaluate regional wall motion. Left ventricular diastolic parameters are indeterminate.  2. Right ventricular systolic function is normal. The right ventricular size is normal.  3. Left atrial size was severely dilated.  4. 28 mm Sorin, Memo 4D annuloplasty ring at the MV anulus. Mild gradient across the MV anulus, mean gradient 5 mmHg. . The mitral valve has been repaired/replaced. No evidence of mitral valve regurgitation. No evidence of mitral stenosis. Severe mitral  annular calcification.  5. 23 mm Edwards INSPIRIS RESILIA pericardial valve at the AV position. . The aortic valve has been repaired/replaced. There is severe calcifcation of the aortic valve. There is severe thickening of the aortic valve. Aortic valve regurgitation is not visualized. No aortic stenosis is present. There is a 23 mm valve present in the aortic position. Procedure Date: 04/09/2020.  6. The inferior vena cava is normal in size with greater than 50% respiratory variability, suggesting right atrial pressure of 3 mmHg. FINDINGS  Left Ventricle: Left ventricular ejection fraction, by estimation, is 55 to 60%. The left ventricle has normal function. Left ventricular endocardial border not optimally defined to evaluate regional wall motion. The left ventricular internal cavity size was normal in size. There is no left ventricular hypertrophy. Left ventricular diastolic parameters are  indeterminate. Right Ventricle: The right ventricular size is normal. No increase in right ventricular wall thickness. Right ventricular systolic function is normal. Left Atrium: Left atrial size was severely dilated. Right Atrium: Right atrial size was normal in size. Pericardium: There is no evidence of pericardial effusion. Mitral Valve: 28 mm Sorin, Memo 4D annuloplasty ring at the MV anulus. Mild gradient across the MV anulus, mean gradient 5 mmHg. The mitral valve has been repaired/replaced. There is mild thickening of the mitral valve leaflet(s). There is mild calcification of the mitral valve leaflet(s). Severe mitral annular calcification. No evidence of mitral valve regurgitation. No evidence of mitral valve stenosis. The mean mitral valve gradient is 5.2 mmHg with average heart rate of 96 bpm. Tricuspid Valve: The tricuspid valve is normal in structure. Tricuspid valve regurgitation is not demonstrated. No evidence of tricuspid stenosis. Aortic Valve: 23 mm Edwards INSPIRIS RESILIA pericardial valve at the AV position. The aortic valve has been repaired/replaced. There is severe calcifcation of the aortic valve. There is severe thickening of the aortic valve. There is severe aortic valve  annular calcification. Aortic valve regurgitation is not visualized. No aortic stenosis is present. Aortic valve mean gradient measures 11.4 mmHg. Aortic valve peak gradient measures 19.4 mmHg. Aortic valve area, by VTI measures 2.23 cm. There is  a 23 mm valve present in the aortic position. Procedure Date: 04/09/2020. Pulmonic Valve: The pulmonic valve was not well visualized. Pulmonic valve regurgitation is not visualized. No evidence of pulmonic stenosis. Aorta: The aortic root is normal in size and structure. Pulmonary Artery: Indeterminant PASP, inadequate TR jet. Venous: The inferior vena cava is normal in size with greater than 50% respiratory variability, suggesting right atrial pressure of 3 mmHg. IAS/Shunts: No  atrial level shunt detected by color flow Doppler.  LEFT VENTRICLE PLAX 2D LVIDd:         4.30 cm  Diastology LVIDs:         2.99 cm  LV e' medial:    3.00 cm/s LV PW:         0.95 cm  LV E/e' medial:  50.3 LV IVS:        0.79 cm  LV e' lateral:   8.11 cm/s LVOT diam:     2.30 cm  LV E/e' lateral: 18.6 LV SV:         77 LV SV Index:   46 LVOT Area:     4.15 cm  RIGHT VENTRICLE RV S prime:     10.00 cm/s TAPSE (M-mode): 1.7 cm LEFT ATRIUM           Index       RIGHT ATRIUM           Index LA diam:      3.90 cm 2.34 cm/m  RA Area:     15.00 cm LA Vol (A2C): 26.0 ml 15.57 ml/m RA Volume:   35.80 ml  21.44 ml/m LA Vol (A4C): 76.9 ml 46.06 ml/m  AORTIC VALVE AV Area (Vmax):    2.25 cm AV Area (Vmean):   2.17 cm AV Area (VTI):     2.23 cm AV Vmax:           220.18 cm/s AV Vmean:          154.037 cm/s AV VTI:            0.343 m AV Peak Grad:      19.4 mmHg AV Mean Grad:      11.4 mmHg LVOT Vmax:         119.27 cm/s LVOT Vmean:        80.595 cm/s LVOT VTI:          0.185 m LVOT/AV VTI ratio: 0.54  AORTA Ao Root diam: 2.90 cm Ao Asc diam:  3.90 cm MITRAL VALVE MV Area (PHT): 2.26 cm     SHUNTS MV Mean grad:  5.2 mmHg     Systemic VTI:  0.18 m MV Decel Time: 335 msec     Systemic Diam: 2.30 cm MV E velocity: 151.00 cm/s MV A velocity: 56.80 cm/s MV E/A ratio:  2.66 Dina Rich MD Electronically signed by Dina Rich MD Signature Date/Time: 06/12/2020/4:40:14 PM    Final    ECHOCARDIOGRAM COMPLETE  Result Date: 06/05/2020    ECHOCARDIOGRAM REPORT   Patient Name:   Scott Rios Date of Exam: 06/03/2020 Medical Rec #:  [id number]      Height:       67.0 in Accession #:    [id number]     Weight:       133.0 lb Date of Birth:  09-11-1955      BSA:          1.700 m Patient Age:    69 years  BP:           119/72 mmHg Patient Gender: M              HR:           105 bpm. Exam Location:  [facility] Procedure: 2D Echo, Cardiac Doppler and Color Doppler Indications:    I35.9 AVD, I05.9 MVD  History:        Patient  has prior history of Echocardiogram examinations, most                 recent 03/31/2020. CHF, CAD, Prior CABG, Aortic Valve Disease and                 Mitral Valve Disease; Risk Factors:Former Smoker, Hypertension,                 Diabetes and Dyslipidemia.                  Mitral Valve: 28 mm Sorin, Memo 4D annuloplasty ring valve is                 present in the mitral position. Procedure Date: 04/09/2020.  Sonographer:    Jake Seats Referring Phys: [phone] TRACI R TURNER IMPRESSIONS  1. Since the last study on 03/31/2020 the patient is post AVR and MVR, CABG. There is only trivial residual mitral and aortic regurgitation. LVEF has improved from 35-40% to 55-60%.  2. Left ventricular ejection fraction, by estimation, is 55 to 60%. The left ventricle has normal function. The left ventricle has no regional wall motion abnormalities. Left ventricular diastolic function could not be evaluated. Elevated left atrial pressure.  3. Right ventricular systolic function is normal. The right ventricular size is normal.  4. Left atrial size was mildly dilated.  5. The mitral valve has been repaired/replaced. Trivial mitral valve regurgitation. No evidence of mitral stenosis. There is a 28 mm Sorin, Memo 4D annuloplasty ring present in the mitral position. Procedure Date: 04/09/2020.  6. The aortic valve has been repaired/replaced. Aortic valve regurgitation is trivial. No aortic stenosis is present. Procedure Date: 04/09/2020. Aortic valve mean gradient measures 14.0 mmHg.  7. The inferior vena cava is normal in size with greater than 50% respiratory variability, suggesting right atrial pressure of 3 mmHg. Comparison(s): Echocardiogram done 03/31/20 showed an EF of 35-40% with severe AR and moderate to severe MR. FINDINGS  Left Ventricle: Left ventricular ejection fraction, by estimation, is 55 to 60%. The left ventricle has normal function. The left ventricle has no regional wall motion abnormalities. The left ventricular  internal cavity size was normal in size. There is  no left ventricular hypertrophy. Abnormal (paradoxical) septal motion consistent with post-operative status. Left ventricular diastolic function could not be evaluated due to atrial fibrillation. Left ventricular diastolic function could not be evaluated. Elevated left atrial pressure. Right Ventricle: The right ventricular size is normal. No increase in right ventricular wall thickness. Right ventricular systolic function is normal. Left Atrium: Left atrial size was mildly dilated. Right Atrium: Right atrial size was normal in size. Pericardium: There is no evidence of pericardial effusion. Mitral Valve: The mitral valve has been repaired/replaced. Trivial mitral valve regurgitation. There is a 28 mm Sorin, Memo 4D annuloplasty ring present in the mitral position. Procedure Date: 04/09/2020. No evidence of mitral valve stenosis. Tricuspid Valve: The tricuspid valve is normal in structure. Tricuspid valve regurgitation is mild . No evidence of tricuspid stenosis. Aortic Valve: The aortic valve has been repaired/replaced. Aortic  valve regurgitation is trivial. Aortic regurgitation PHT measures 984 msec. No aortic stenosis is present. Aortic valve mean gradient measures 14.0 mmHg. Aortic valve peak gradient measures 24.9 mmHg. Aortic valve area, by VTI measures 1.92 cm. There is a 23 mm Edwards INSP[employer]IS RESILIA pericardial valve valve present in the aortic position. Pulmonic Valve: The pulmonic valve was normal in structure. Pulmonic valve regurgitation is not visualized. No evidence of pulmonic stenosis. Aorta: The aortic root is normal in size and structure. Venous: The inferior vena cava is normal in size with greater than 50% respiratory variability, suggesting right atrial pressure of 3 mmHg. IAS/Shunts: No atrial level shunt detected by color flow Doppler.  LEFT VENTRICLE PLAX 2D LVIDd:         4.38 cm     Diastology LVIDs:         3.50 cm     LV e' medial:     4.53 cm/s LV PW:         0.78 cm     LV E/e' medial:  34.0 LV IVS:        0.98 cm     LV e' lateral:   6.20 cm/s LVOT diam:     1.90 cm     LV E/e' lateral: 24.8 LV SV:         81 LV SV Index:   48 LVOT Area:     2.84 cm  LV Volumes (MOD) LV vol d, MOD A2C: 68.6 ml LV vol d, MOD A4C: 89.6 ml LV vol s, MOD A2C: 46.6 ml LV vol s, MOD A4C: 49.2 ml LV SV MOD A2C:     22.0 ml LV SV MOD A4C:     89.6 ml LV SV MOD BP:      34.9 ml RIGHT VENTRICLE RV S prime:     7.87 cm/s TAPSE (M-mode): 0.9 cm LEFT ATRIUM             Index LA diam:        3.60 cm 2.12 cm/m LA Vol (A2C):   55.1 ml 32.41 ml/m LA Vol (A4C):   75.5 ml 44.40 ml/m LA Biplane Vol:         40.80 ml/m  AORTIC VALVE AV Area (Vmax):    1.60 cm AV Area (Vmean):   1.70 cm AV Area (VTI):     1.92 cm AV Vmax:           249.50 cm/s AV Vmean:          164.250 cm/s AV VTI:            0.422 m AV Peak Grad:      24.9 mmHg AV Mean Grad:      14.0 mmHg LVOT Vmax:         141.00 cm/s LVOT Vmean:        98.200 cm/s LVOT VTI:          0.285 m LVOT/AV VTI ratio: 0.68 AI PHT:            984 msec  AORTA Ao Root diam: 2.90 cm MITRAL VALVE MV Area (PHT):              SHUNTS MV Decel Time: 240 msec     Systemic VTI:  0.29 m MR Peak grad: 7.1 mmHg      Systemic Diam: 1.90 cm MR Vmax:      133.00 cm/s MV E velocity: 154.00 cm/s MV A velocity: 81.40 cm/s MV E/A  ratio:  1.89 Tobias Alexander MD Electronically signed by Tobias Alexander MD Signature Date/Time: 06/05/2020/9:43:17 AM    Final     Microbiology: Recent Results (from the past 240 hour(s))  Respiratory Panel by RT PCR (Flu A&B, Covid) - Nasopharyngeal Swab     Status: None   Collection Time: 06/25/2020  1:36 PM   Specimen: Nasopharyngeal Swab  Result Value Ref Range Status   SARS Coronavirus 2 by RT PCR NEGATIVE NEGATIVE Final    Comment: (NOTE) SARS-CoV-2 target nucleic acids are NOT DETECTED.  The SARS-CoV-2 RNA is generally detectable in upper respiratoy specimens during the acute phase of infection. The  lowest concentration of SARS-CoV-2 viral copies this assay can detect is 131 copies/mL. A negative result does not preclude SARS-Cov-2 infection and should not be used as the sole basis for treatment or other patient management decisions. A negative result may occur with  improper specimen collection/handling, submission of specimen other than nasopharyngeal swab, presence of viral mutation(s) within the areas targeted by this assay, and inadequate number of viral copies (<131 copies/mL). A negative result must be combined with clinical observations, patient history, and epidemiological information. The expected result is Negative.  Fact Sheet for Patients:  https://www.moore.com/  Fact Sheet for Healthcare Providers:  https://www.young.biz/  This test is no t yet approved or cleared by the [place] FDA and  has been authorized for detection and/or diagnosis of SARS-CoV-2 by FDA under an Emergency Use Authorization (EUA). This EUA will remain  in effect (meaning this test can be used) for the duration of the COVID-19 declaration under Section 564(b)(1) of the Act, 21 U.S.C. section 360bbb-3(b)(1), unless the authorization is terminated or revoked sooner.     Influenza A by PCR NEGATIVE NEGATIVE Final   Influenza B by PCR NEGATIVE NEGATIVE Final    Comment: (NOTE) The Xpert Xpress SARS-CoV-2/FLU/RSV assay is intended as an aid in  the diagnosis of influenza from Nasopharyngeal swab specimens and  should not be used as a sole basis for treatment. Nasal washings and  aspirates are unacceptable for Xpert Xpress SARS-CoV-2/FLU/RSV  testing.  Fact Sheet for Patients: https://www.moore.com/  Fact Sheet for Healthcare Providers: https://www.young.biz/  This test is not yet approved or cleared by the [place] FDA and  has been authorized for detection and/or diagnosis of SARS-CoV-2 by  FDA under  an Emergency Use Authorization (EUA). This EUA will remain  in effect (meaning this test can be used) for the duration of the  Covid-19 declaration under Section 564(b)(1) of the Act, 21  U.S.C. section 360bbb-3(b)(1), unless the authorization is  terminated or revoked. Performed at [facility], [address], [place], [place] [address]   Culture, blood (routine x 2)     Status: Abnormal   Collection Time: 07/05/2020  2:22 PM   Specimen: BLOOD LEFT FOREARM  Result Value Ref Range Status   Specimen Description   Final    BLOOD LEFT FOREARM Performed at [facility], [address], [place], [place] [address]    Special Requests   Final    NONE Performed at [facility], [address], [place], [place] [address]    Culture  Setup Time   Final    IN BOTH AEROBIC AND ANAEROBIC BOTTLES GRAM POSITIVE COCCI Gram Stain Report Called to,Read Back By and Verified With: C TURNER,RN@0436  06/12/20 MKELLY    Culture ENTEROCOCCUS FAECALIS (A)  Final   Report Status 06/14/2020 FINAL  Final   Organism ID, Bacteria ENTEROCOCCUS  FAECALIS  Final      Susceptibility   Enterococcus faecalis - MIC*    AMPICILLIN <=2 SENSITIVE Sensitive     VANCOMYCIN 1 SENSITIVE Sensitive     GENTAMICIN SYNERGY SENSITIVE Sensitive     * ENTEROCOCCUS FAECALIS  Blood Culture ID Panel (Reflexed)     Status: Abnormal   Collection Time: 06-13-2020  2:22 PM  Result Value Ref Range Status   Enterococcus faecalis DETECTED (A) NOT DETECTED Final    Comment: CRITICAL RESULT CALLED TO, READ BACK BY AND VERIFIED WITH: G. Coffee PharmD 8:20 06/12/20 ([email or web address])    Enterococcus Faecium NOT DETECTED NOT DETECTED Final   Listeria monocytogenes NOT DETECTED NOT DETECTED Final   Staphylococcus species NOT DETECTED NOT DETECTED Final   Staphylococcus aureus (BCID) NOT DETECTED NOT DETECTED Final   Staphylococcus epidermidis NOT DETECTED NOT DETECTED Final   Staphylococcus lugdunensis NOT DETECTED NOT DETECTED Final    Streptococcus species NOT DETECTED NOT DETECTED Final   Streptococcus agalactiae NOT DETECTED NOT DETECTED Final   Streptococcus pneumoniae NOT DETECTED NOT DETECTED Final   Streptococcus pyogenes NOT DETECTED NOT DETECTED Final   A.calcoaceticus-baumannii NOT DETECTED NOT DETECTED Final   Bacteroides fragilis NOT DETECTED NOT DETECTED Final   Enterobacterales NOT DETECTED NOT DETECTED Final   Enterobacter cloacae complex NOT DETECTED NOT DETECTED Final   Escherichia coli NOT DETECTED NOT DETECTED Final   Klebsiella aerogenes NOT DETECTED NOT DETECTED Final   Klebsiella oxytoca NOT DETECTED NOT DETECTED Final   Klebsiella pneumoniae NOT DETECTED NOT DETECTED Final   Proteus species NOT DETECTED NOT DETECTED Final   Salmonella species NOT DETECTED NOT DETECTED Final   Serratia marcescens NOT DETECTED NOT DETECTED Final   Haemophilus influenzae NOT DETECTED NOT DETECTED Final   Neisseria meningitidis NOT DETECTED NOT DETECTED Final   Pseudomonas aeruginosa NOT DETECTED NOT DETECTED Final   Stenotrophomonas maltophilia NOT DETECTED NOT DETECTED Final   Candida albicans NOT DETECTED NOT DETECTED Final   Candida auris NOT DETECTED NOT DETECTED Final   Candida glabrata NOT DETECTED NOT DETECTED Final   Candida krusei NOT DETECTED NOT DETECTED Final   Candida parapsilosis NOT DETECTED NOT DETECTED Final   Candida tropicalis NOT DETECTED NOT DETECTED Final   Cryptococcus neoformans/gattii NOT DETECTED NOT DETECTED Final   Vancomycin resistance NOT DETECTED NOT DETECTED Final    Comment: Performed at [facility], [address], [place], [place] [address]  Culture, blood (routine x 2)     Status: Abnormal   Collection Time: 06/13/2020  2:26 PM   Specimen: BLOOD LEFT HAND  Result Value Ref Range Status   Specimen Description   Final    BLOOD LEFT HAND Performed at [facility], [address] [place], [place] [address]    Special Requests   Final    NONE Performed at [facility], [address], [place], [place] [address]    Culture  Setup Time   Final    IN BOTH AEROBIC AND ANAEROBIC BOTTLES GRAM POSITIVE COCCI Gram Stain Report Called to,Read Back By and Verified With: C TURNER,RN@0436  06/12/20 [facility] Performed at [facility], [address], [place], [place] [address]    Culture (A)  Final    ENTEROCOCCUS FAECALIS SUSCEPTIBILITIES PERFORMED ON PREVIOUS CULTURE WITHIN THE LAST 5 DAYS. Performed at [facility], [address], [place], [place] [address]    Report Status 06/14/2020 FINAL  Final  Urine culture     Status: Abnormal  Collection Time: 07/07/20  7:26 PM   Specimen: Urine, Clean Catch  Result Value Ref Range Status   Specimen Description   Final    URINE, CLEAN CATCH Performed at [facility], [address], [place], [place] [address]    Special Requests   Final    NONE Performed at [facility], [address], [place], [place] [address]    Culture >=100,000 COLONIES/mL ENTEROCOCCUS FAECALIS (A)  Final   Report Status 06/14/2020 FINAL  Final   Organism ID, Bacteria ENTEROCOCCUS FAECALIS (A)  Final      Susceptibility   Enterococcus faecalis - MIC*    AMPICILLIN <=2 SENSITIVE Sensitive     NITROFURANTOIN <=16 SENSITIVE Sensitive     VANCOMYCIN 1 SENSITIVE Sensitive     * >=100,000 COLONIES/mL ENTEROCOCCUS FAECALIS  Culture, blood (routine x 2)     Status: None (Preliminary result)   Collection Time: 06/13/20  2:52 PM   Specimen: BLOOD  Result Value Ref Range Status   Specimen Description   Final    BLOOD LEFT ANTECUBITAL Performed at [facility], [address], [name], [place] [address]    Special Requests   Final    BOTTLES DRAWN AEROBIC AND ANAEROBIC Blood Culture adequate volume Performed at [facility], [address] [place], [place] [address]    Culture   Final    NO GROWTH 3 DAYS Performed at [facility], [address] [place], [place] [address]     Report Status PENDING  Incomplete  Culture, blood (routine x 2)     Status: Abnormal   Collection Time: 06/13/20  2:52 PM   Specimen: BLOOD  Result Value Ref Range Status   Specimen Description   Final    BLOOD BLOOD RIGHT HAND Performed at [facility], [address], [place], [place] [address]    Special Requests   Final    BOTTLES DRAWN AEROBIC AND ANAEROBIC Blood Culture adequate volume Performed at [facility], [address], [place], [place] [address]    Culture  Setup Time   Final    GRAM POSITIVE COCCI ANAEROBIC BOTTLE ONLY Gram Stain Report Called to,Read Back By and Verified With: MARYANE HOWERTON,RN @1650  06/14/2020 KAY CRITICAL RESULT CALLED TO, READ BACK BY AND VERIFIED WITH: PHRMD V BRYK @0108  06/15/20 BY S GEZAHEGN    Culture (A)  Final    ENTEROCOCCUS FAECALIS SUSCEPTIBILITIES PERFORMED ON PREVIOUS CULTURE WITHIN THE LAST 5 DAYS. Performed at [facility], [address], [place], [place] [address]    Report Status 07/09/2020 FINAL  Final  Blood Culture ID Panel (Reflexed)     Status: Abnormal   Collection Time: 06/13/20  2:52 PM  Result Value Ref Range Status   Enterococcus faecalis DETECTED (A) NOT DETECTED Final    Comment: CRITICAL RESULT CALLED TO, READ BACK BY AND VERIFIED WITH: PHRMD V BRYK @0108  06/15/20 BY S GEZAHEGN    Enterococcus Faecium NOT DETECTED NOT DETECTED Final   Listeria monocytogenes NOT DETECTED NOT DETECTED Final   Staphylococcus species NOT DETECTED NOT DETECTED Final   Staphylococcus aureus (BCID) NOT DETECTED NOT DETECTED Final   Staphylococcus epidermidis NOT DETECTED NOT DETECTED Final   Staphylococcus lugdunensis NOT DETECTED NOT DETECTED Final   Streptococcus species NOT DETECTED NOT DETECTED Final   Streptococcus agalactiae NOT DETECTED NOT DETECTED Final   Streptococcus pneumoniae NOT DETECTED NOT DETECTED Final   Streptococcus pyogenes NOT DETECTED NOT  DETECTED Final    A.calcoaceticus-baumannii NOT DETECTED NOT DETECTED Final   Bacteroides fragilis NOT DETECTED NOT DETECTED Final   Enterobacterales NOT DETECTED NOT DETECTED Final   Enterobacter cloacae complex NOT DETECTED NOT DETECTED Final   Escherichia coli NOT DETECTED NOT DETECTED Final   Klebsiella aerogenes NOT DETECTED NOT DETECTED Final   Klebsiella oxytoca NOT DETECTED NOT DETECTED Final   Klebsiella pneumoniae NOT DETECTED NOT DETECTED Final   Proteus species NOT DETECTED NOT DETECTED Final   Salmonella species NOT DETECTED NOT DETECTED Final   Serratia marcescens NOT DETECTED NOT DETECTED Final   Haemophilus influenzae NOT DETECTED NOT DETECTED Final   Neisseria meningitidis NOT DETECTED NOT DETECTED Final   Pseudomonas aeruginosa NOT DETECTED NOT DETECTED Final   Stenotrophomonas maltophilia NOT DETECTED NOT DETECTED Final   Candida albicans NOT DETECTED NOT DETECTED Final   Candida auris NOT DETECTED NOT DETECTED Final   Candida glabrata NOT DETECTED NOT DETECTED Final   Candida krusei NOT DETECTED NOT DETECTED Final   Candida parapsilosis NOT DETECTED NOT DETECTED Final   Candida tropicalis NOT DETECTED NOT DETECTED Final   Cryptococcus neoformans/gattii NOT DETECTED NOT DETECTED Final   Vancomycin resistance NOT DETECTED NOT DETECTED Final    Comment: Performed at [facility], [address], [place], [place] [address]  Culture, blood (Routine X 2) w Reflex to ID Panel     Status: None (Preliminary result)   Collection Time: 06/30/2020  4:06 AM   Specimen: BLOOD LEFT ARM  Result Value Ref Range Status   Specimen Description BLOOD LEFT ARM  Final   Special Requests   Final    BOTTLES DRAWN AEROBIC AND ANAEROBIC Blood Culture adequate volume   Culture   Final    NO GROWTH 1 DAY Performed at [facility], [address], [place], [place] [address]    Report Status PENDING  Incomplete  Culture, blood (Routine X 2) w Reflex to ID Panel     Status: None (Preliminary result)     Collection Time: 07/11/2020  6:38 AM   Specimen: BLOOD LEFT HAND  Result Value Ref Range Status   Specimen Description BLOOD LEFT HAND  Final   Special Requests   Final    BOTTLES DRAWN AEROBIC AND ANAEROBIC Blood Culture adequate volume   Culture   Final    NO GROWTH < 24 HOURS Performed at [facility], [address], [place], [place] [address]    Report Status PENDING  Incomplete     Labs: Basic Metabolic Panel: Recent Labs  Lab Jul 11, 2020 1418 07/11/20 1418 06/12/20 0608 06/14/20 0556 06/26/2020 0406  NA 128*  --  132* 128*  --   K 3.8   < > 3.3* 3.1*  --   CL 90*  --  95* 94*  --   CO2 30  --  30 27  --   GLUCOSE 425*  --  96 104*  --   BUN 12  --  13 12  --   CREATININE 0.51*  --  0.46* 0.36*  --   CALCIUM 8.4*  --  8.4* 7.9*  --   MG 1.8  --   --   --  1.8   < > = values in this interval not displayed.   Liver Function Tests: Recent Labs  Lab 07/11/2020 1418  AST 18  ALT 23  ALKPHOS 139*  BILITOT 0.9  PROT 6.0*  ALBUMIN 2.4*   No results for  input(s): LIPASE, AMYLASE in the last 168 hours. No results for input(s): AMMONIA in the last 168 hours. CBC: Recent Labs  Lab 06/04/2020 1418 06/12/20 0608 06/14/20 0556 07/16/20 0406  WBC 21.7* 21.2* 20.8* 24.2*  NEUTROABS 20.2*  --   --   --   HGB 10.5* 9.6* 8.5* 8.8*  HCT 32.4* 29.1* 26.2* 27.2*  MCV 82.9 83.6 82.6 81.4  PLT 191 175 238 309   Cardiac Enzymes: No results for input(s): CKTOTAL, CKMB, CKMBINDEX, TROPONINI in the last 168 hours. D-Dimer No results for input(s): DDIMER in the last 72 hours. BNP: Invalid input(s): POCBNP CBG: Recent Labs  Lab 07-16-20 0006 2020-07-16 0319 07-16-2020 0849 Jul 16, 2020 1123 07/16/20 1743  GLUCAP 249* 138* 91 98 83   Anemia work up No results for input(s): VITAMINB12, FOLATE, FERRITIN, TIBC, IRON, RETICCTPCT in the last 72 hours. Urinalysis    Component Value Date/Time   COLORURINE YELLOW 05/16/2020 1927   APPEARANCEUR CLEAR 05/28/2020 1927   LABSPEC  1.023 06/07/2020 1927   PHURINE 7.0 05/26/2020 1927   GLUCOSEU >=500 (A) 06/10/2020 1927   HGBUR SMALL (A) 05/16/2020 1927   BILIRUBINUR NEGATIVE 06/08/2020 1927   KETONESUR NEGATIVE 06/06/2020 1927   PROTEINUR 30 (A) 06/06/2020 1927   UROBILINOGEN 0.2 09/29/2012 1710   NITRITE NEGATIVE 05/18/2020 1927   LEUKOCYTESUR NEGATIVE 05/19/2020 1927   Sepsis Labs Invalid input(s): PROCALCITONIN,  WBC,  LACTICIDVEN     SIGNED:  Dimple Nanas, MD  [facility] 06/17/2020, 9:32 AM Pager   If 7PM-7AM, please contact night-coverage [email or web address] Password [email or web address]

## 2020-07-15 NOTE — Progress Notes (Signed)
Patient transported to endo. CCMD notified. EEG tech instructed RN to discontinue EEG machine to allow patient to be transported to ENDO for his procedure. Patient assessment remained unchanged prior to transporting.

## 2020-07-15 NOTE — Anesthesia Preprocedure Evaluation (Addendum)
Anesthesia Evaluation  Patient identified by MRN, date of birth, ID band Patient awake    Reviewed: Allergy & Precautions, H&P , NPO status , Patient's Chart, lab work & pertinent test results  Airway Mallampati: II   Neck ROM: full    Dental   Pulmonary former smoker,    breath sounds clear to auscultation       Cardiovascular hypertension, + CAD, + Past MI and + CABG  + Valvular Problems/Murmurs  Rhythm:regular Rate:Normal  S/p AVR and MVr   Neuro/Psych CVA    GI/Hepatic   Endo/Other  diabetes, Type 2  Renal/GU      Musculoskeletal   Abdominal   Peds  Hematology  (+) anemia ,   Anesthesia Other Findings   Reproductive/Obstetrics                             Anesthesia Physical Anesthesia Plan  ASA: III  Anesthesia Plan: MAC   Post-op Pain Management:    Induction: Intravenous  PONV Risk Score and Plan: 1 and Propofol infusion and Treatment may vary due to age or medical condition  Airway Management Planned: Nasal Cannula  Additional Equipment:   Intra-op Plan:   Post-operative Plan:   Informed Consent: I have reviewed the patients History and Physical, chart, labs and discussed the procedure including the risks, benefits and alternatives for the proposed anesthesia with the patient or authorized representative who has indicated his/her understanding and acceptance.       Plan Discussed with: CRNA, Anesthesiologist and Surgeon  Anesthesia Plan Comments:         Anesthesia Quick Evaluation

## 2020-07-15 DEATH — deceased

## 2020-07-27 ENCOUNTER — Ambulatory Visit: Payer: Medicare Other | Admitting: Family Medicine

## 2022-04-22 IMAGING — CT CT HEAD W/O CM
4 series · 16 of 47 positions shown, 18 images · non-contrast
Comparison: MRI 03/28/2020

CLINICAL DATA: Mental status change, unknown cause.

EXAM:
CT HEAD WITHOUT CONTRAST
TECHNIQUE: Contiguous axial images were obtained from the base of the skull
through the vertex without intravenous contrast.

[Series 3: head wo · axial · 0.46mm/px · z∈[+1243,+1363]mm · 7 of 34 slices shown, 9 images]
[im 5/34  brain]
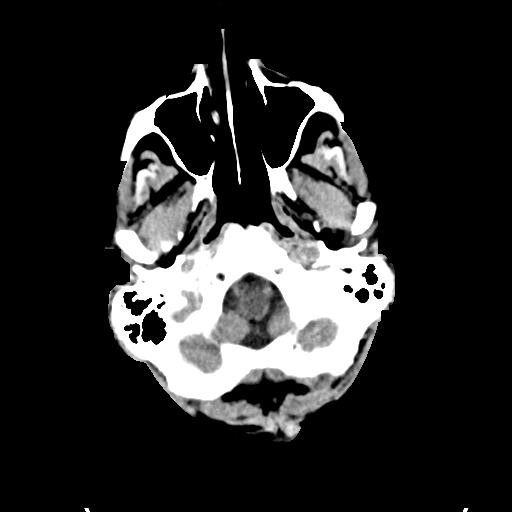
[im 5/34  bone]
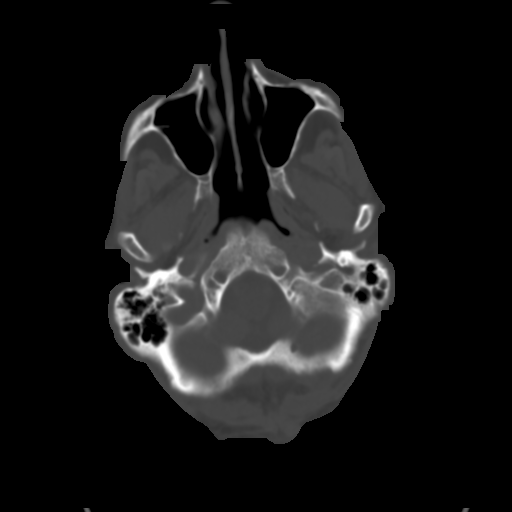
[im 9/34  brain]
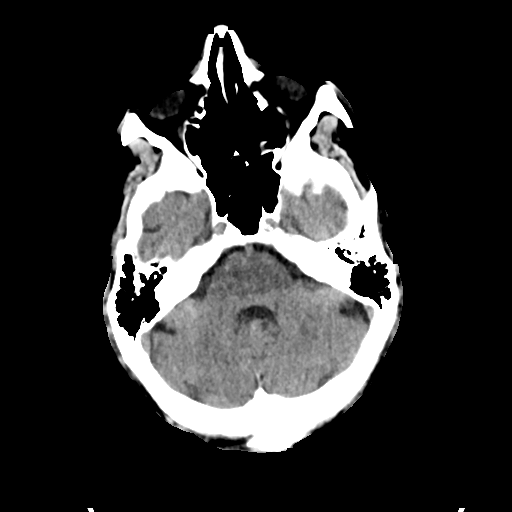
[im 13/34  brain]
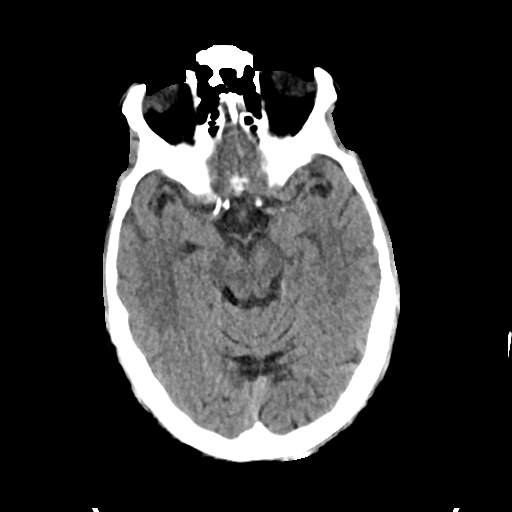
[im 17/34  brain]
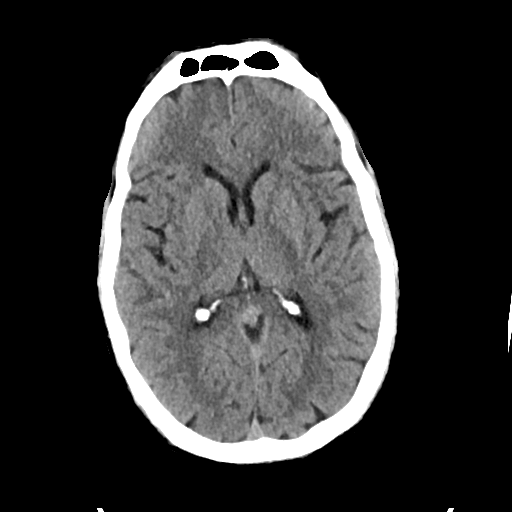
[im 21/34  brain]
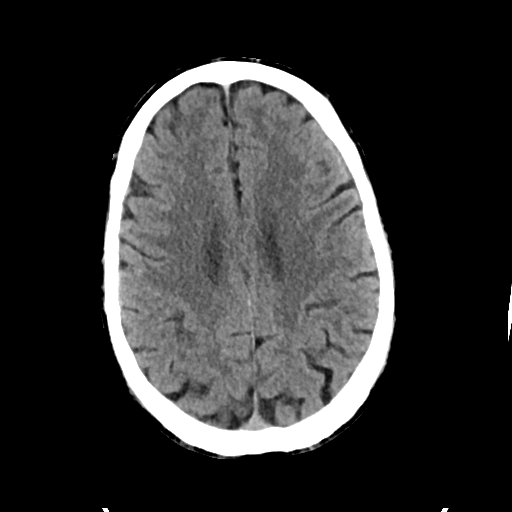
[im 21/34  bone]
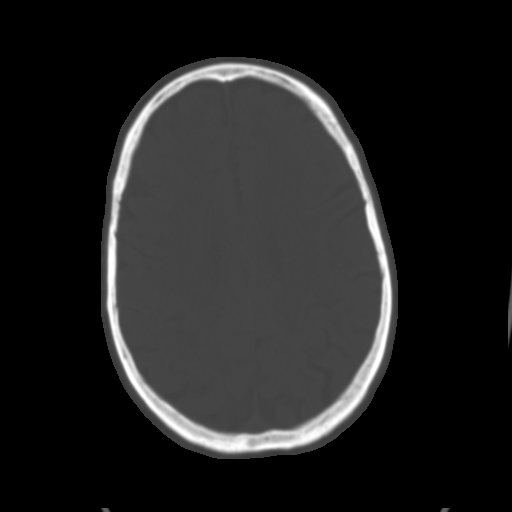
[im 25/34  brain]
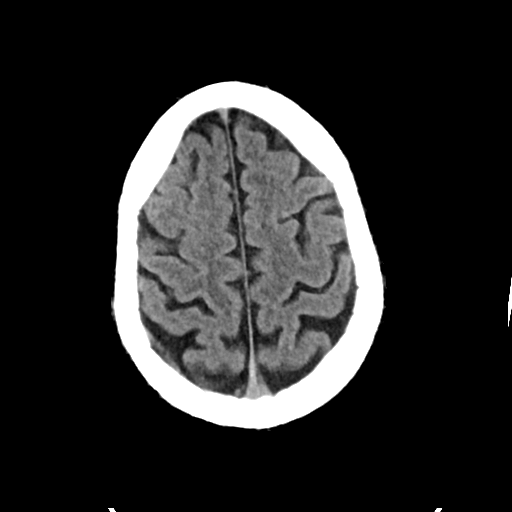
[im 29/34  brain]
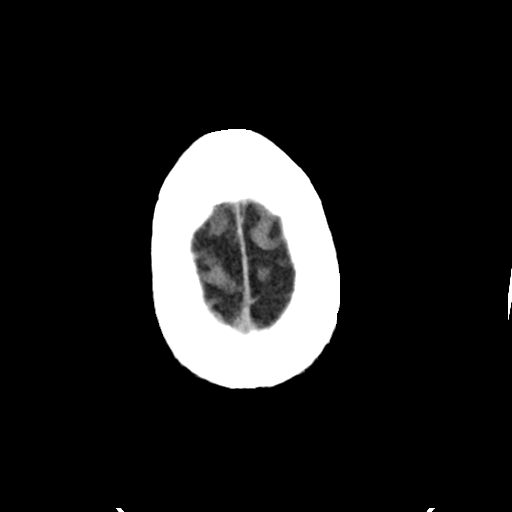

[Series 4: head bone · axial · 0.46mm/px · z∈[+1239,+1271]mm · 3 of 83 slices shown]
[im 9/83  bone]
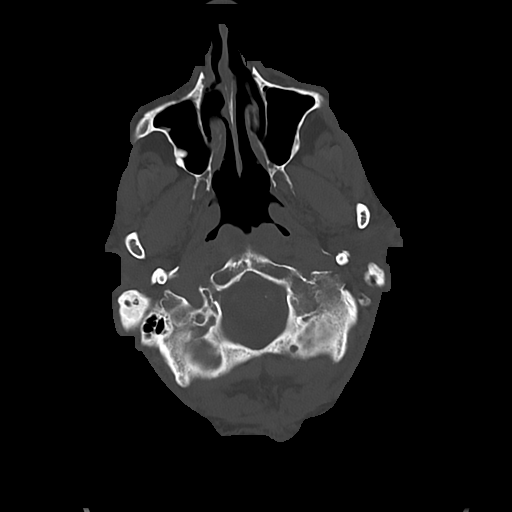
[im 17/83  bone]
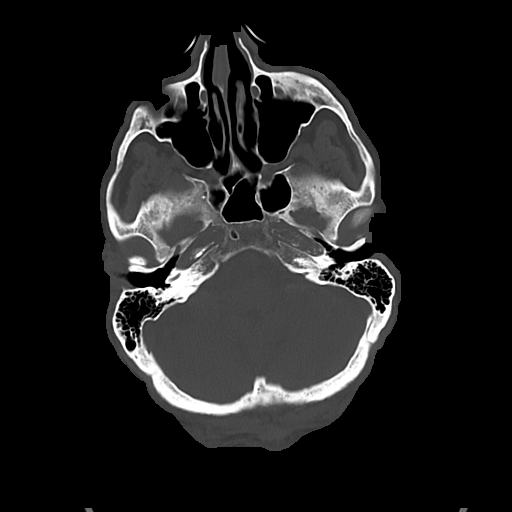
[im 25/83  bone]
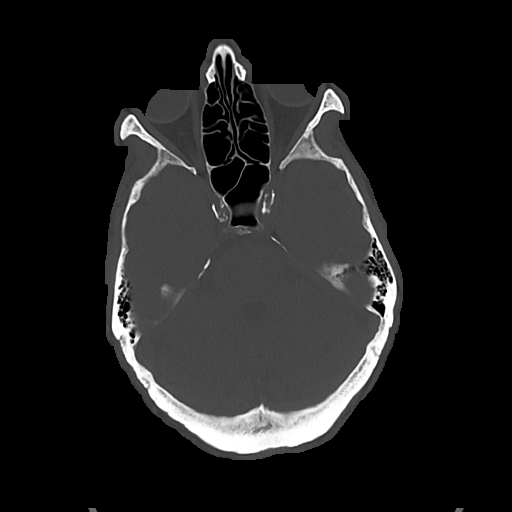

[Series 5: cor soft · coronal · 0.36mm/px · 3 of 75 slices shown]
[im 25/75  brain]
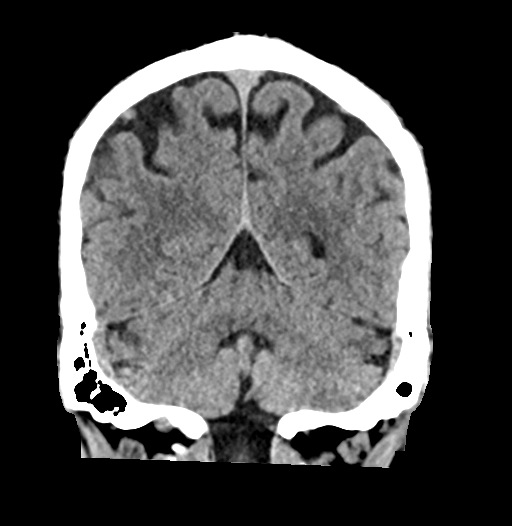
[im 33/75  brain]
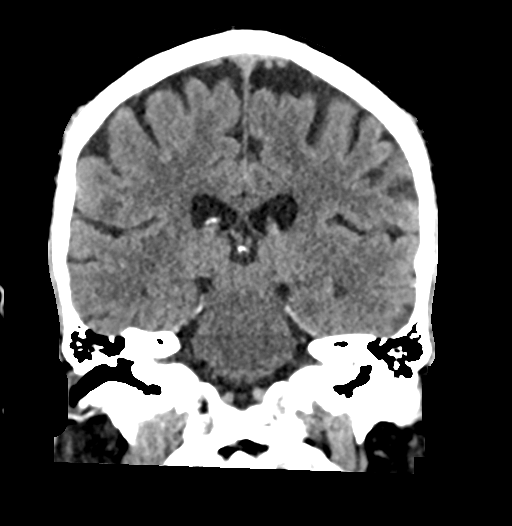
[im 42/75  brain]
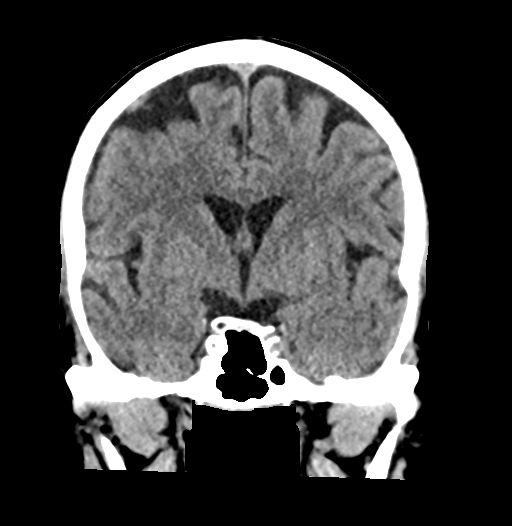

[Series 6: sag soft · sagittal · 0.35mm/px · 3 of 60 slices shown]
[im 20/60  brain]
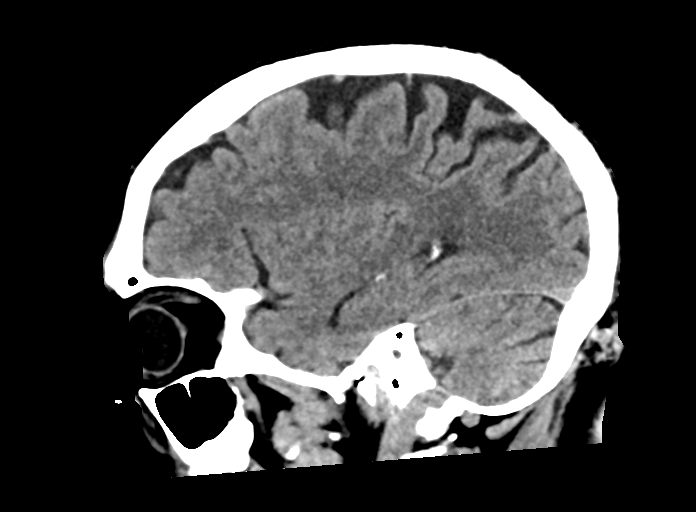
[im 30/60  brain]
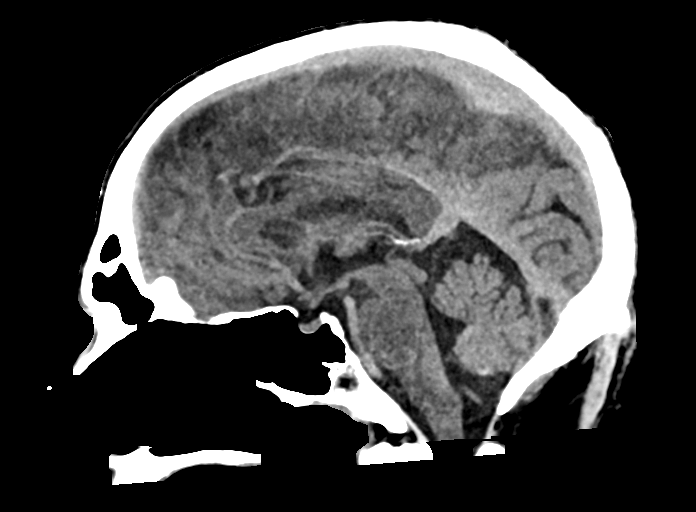
[im 40/60  brain]
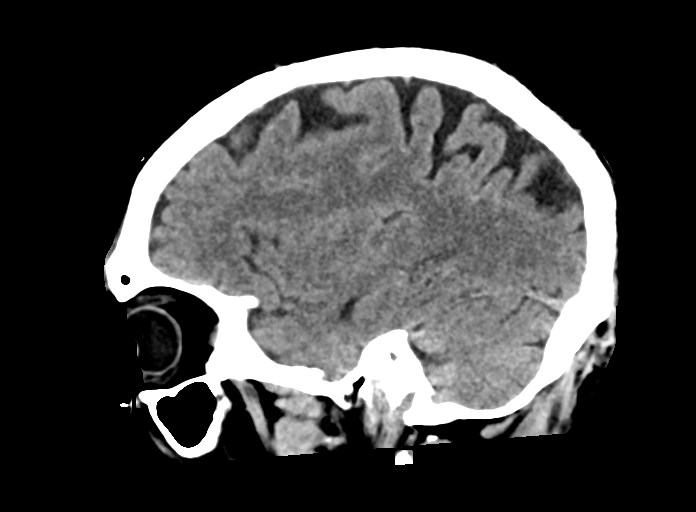

[16 of 47 positions shown; findings below may reference images not displayed]

FINDINGS: Brain: Other than minimal chronic small-vessel change of the deep
white matter, the brain shows a normal appearance without evidence
of malformation, atrophy, old or acute large vessel infarction, mass
lesion, hemorrhage, hydrocephalus or extra-axial collection.

Vascular: There is atherosclerotic calcification of the major
vessels at the base of the brain.

Skull: Normal.  No traumatic finding.  No focal bone lesion.

Sinuses/Orbits: Sinuses are clear. Orbits appear normal. Mastoids
are clear.

Other: None significant
IMPRESSION: No acute finding by CT. Minimal small-vessel change of the
hemispheric deep white matter.

## 2022-06-01 IMAGING — DX DG CHEST 2V
2 series · 2 of 2 positions shown · non-contrast
Comparison: April 13, 2020.

CLINICAL DATA: Status post coronary bypass graft.

EXAM:
CHEST - 2 VIEW

[dg chest 2 view (1 of 2)]
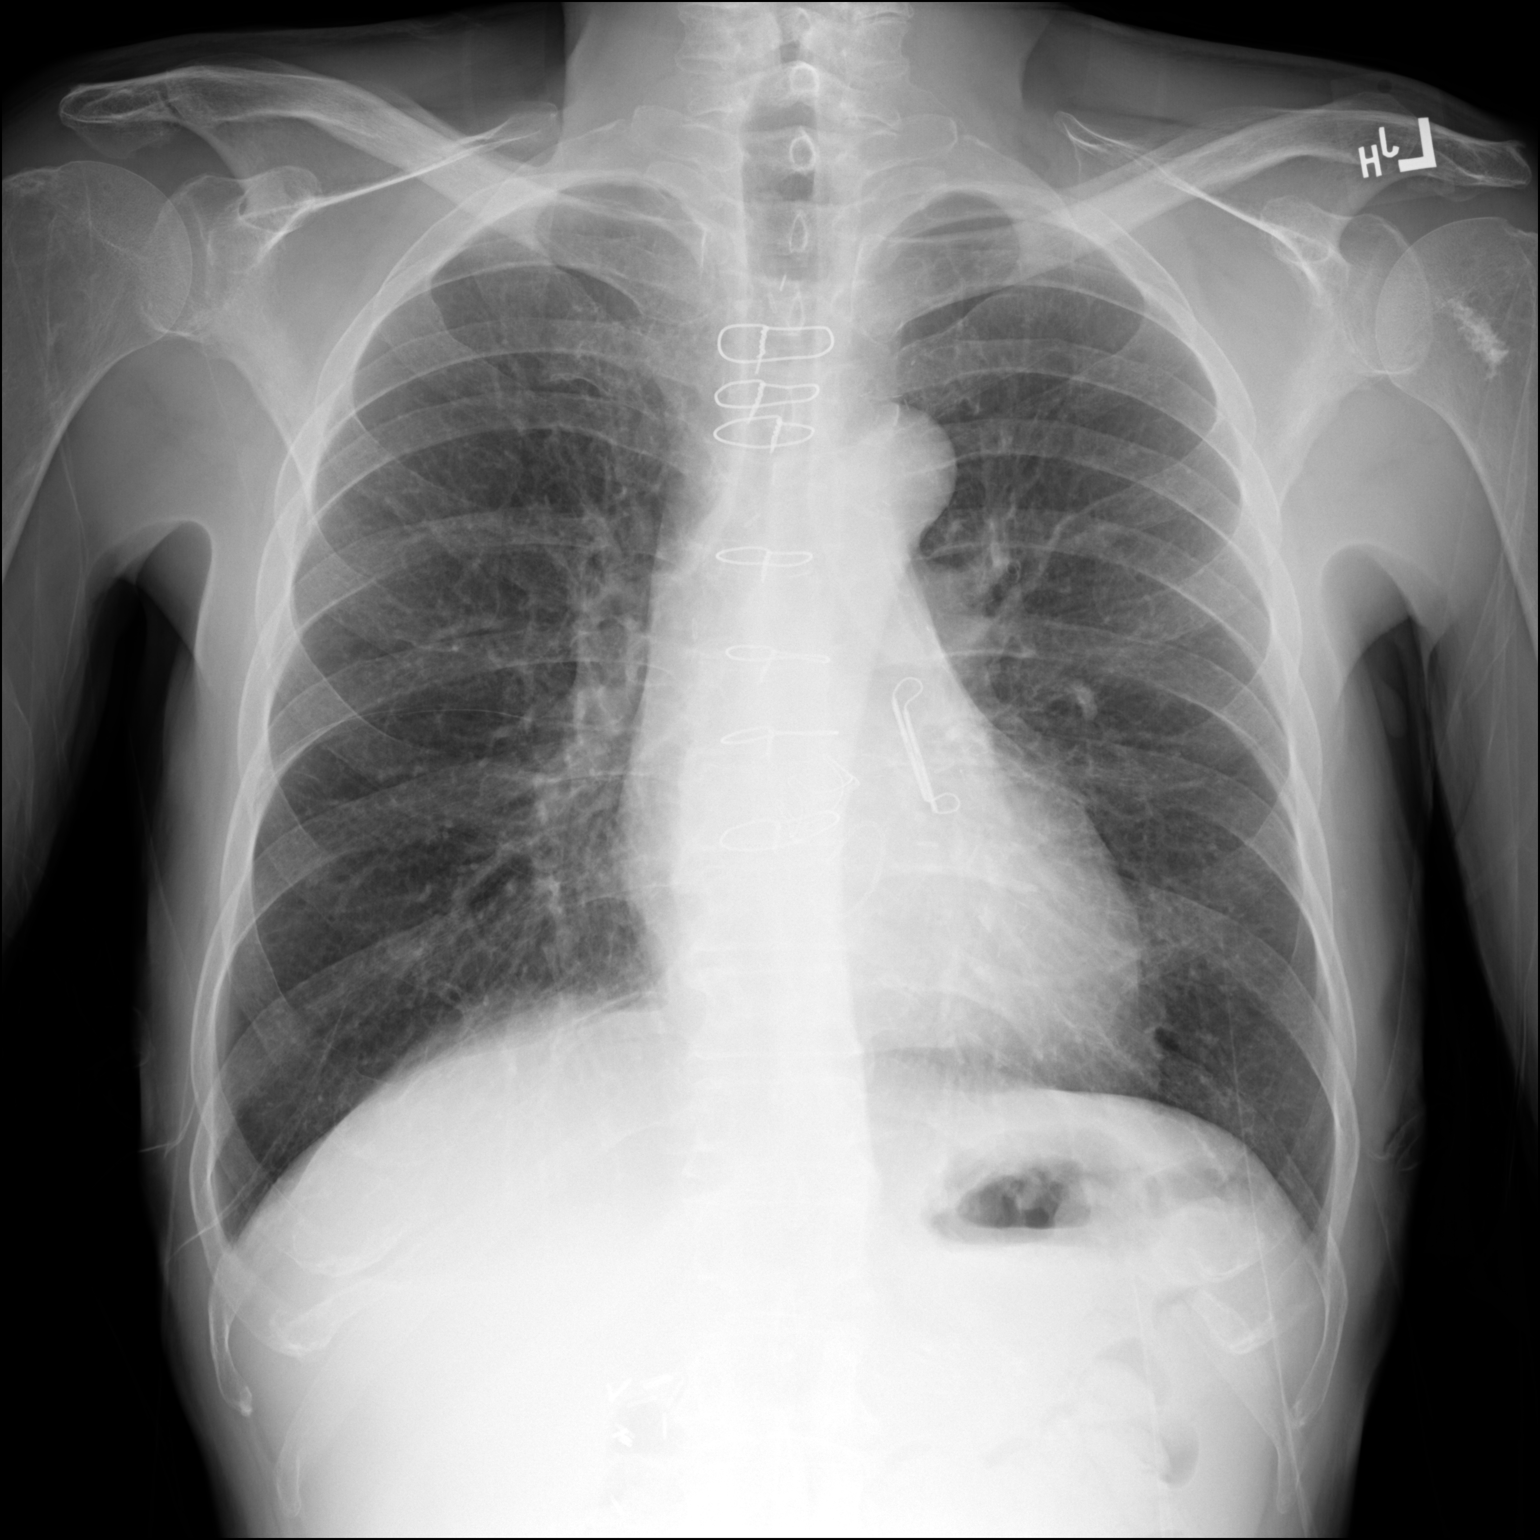

[dg chest 2 view (2 of 2)]
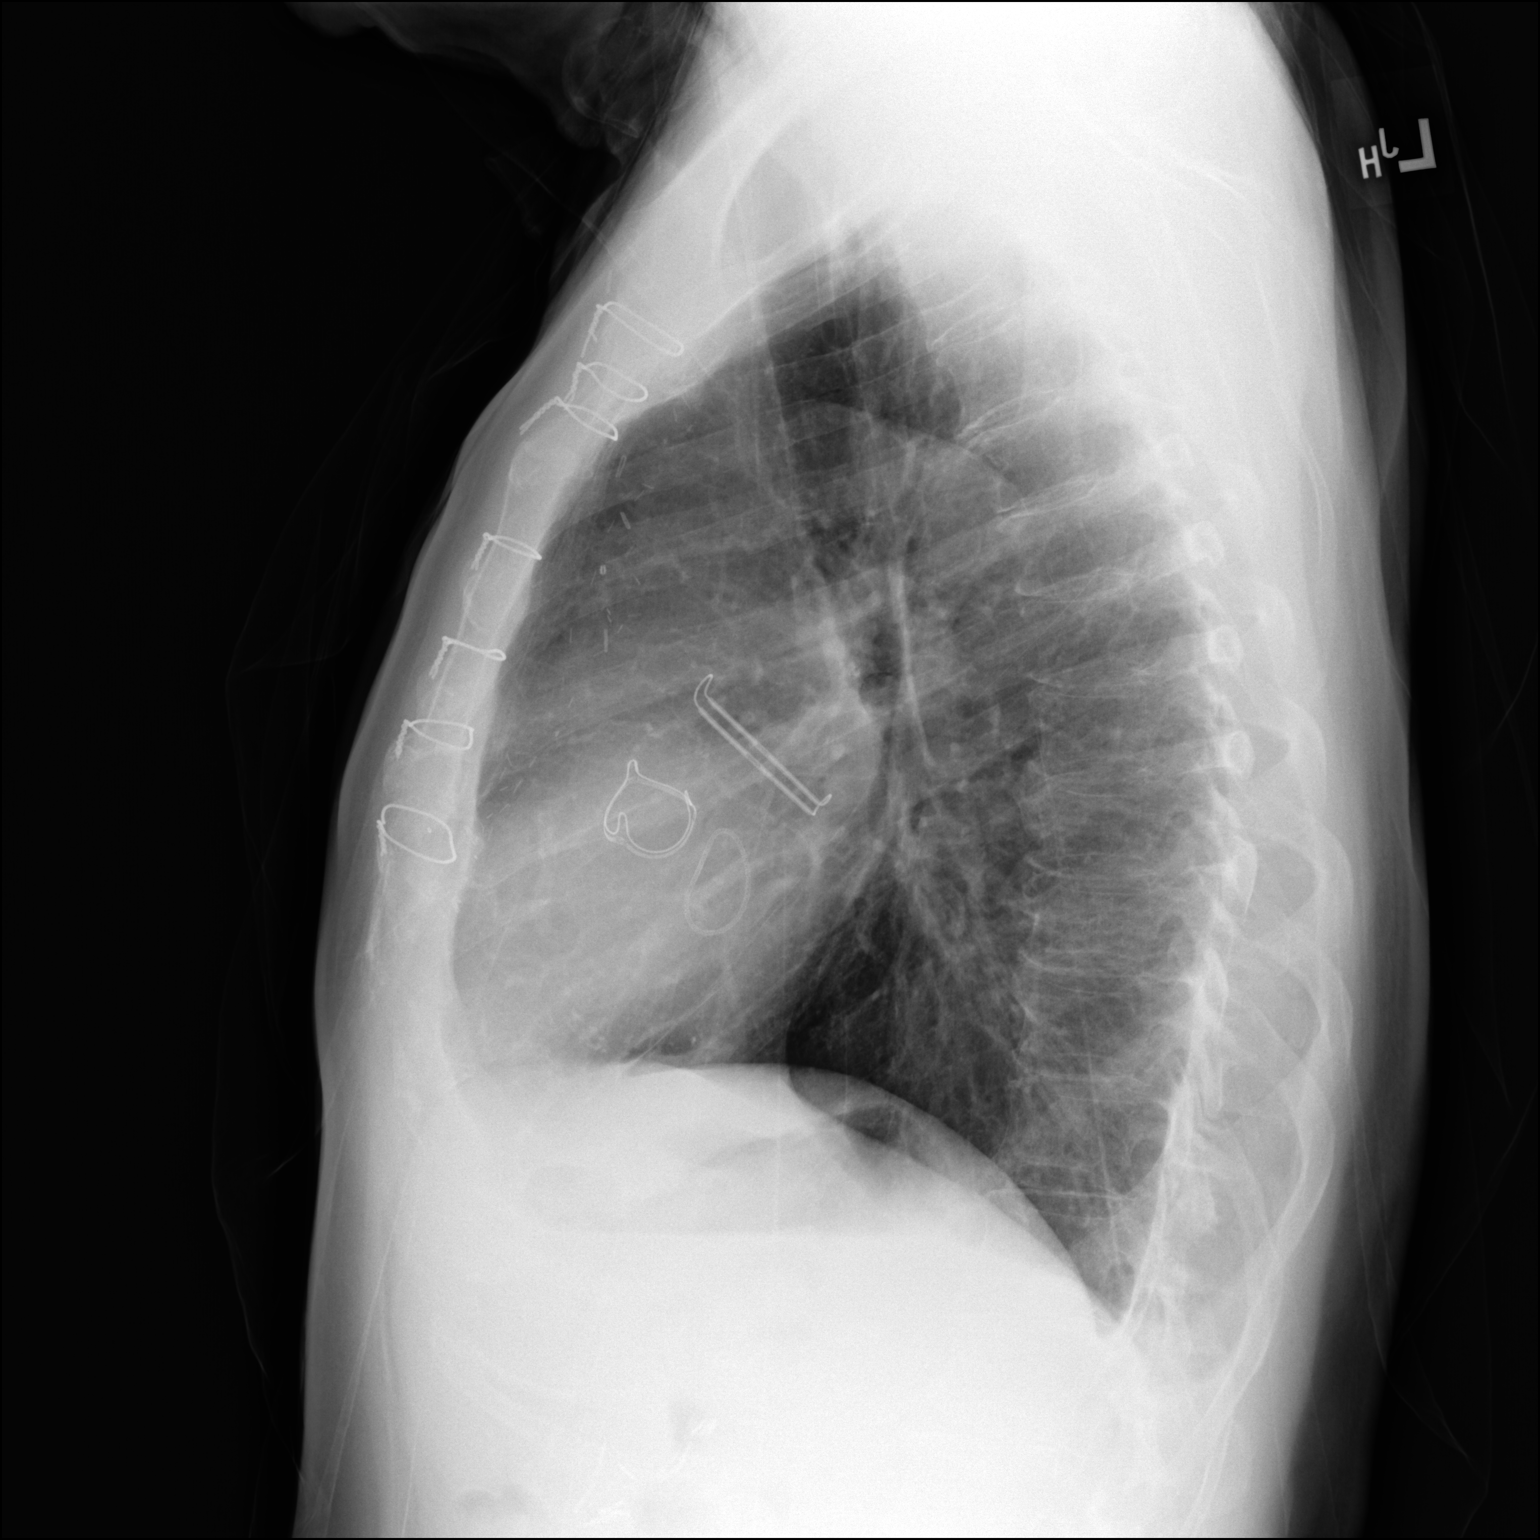

[2 of 2 positions shown; findings below may reference images not displayed]

FINDINGS: The heart size and mediastinal contours are within normal limits.
Both lungs are clear. Status post cardiac valve repair. No
pneumothorax or pleural effusion is noted. The visualized skeletal
structures are unremarkable.
IMPRESSION: No active cardiopulmonary disease.
